# Patient Record
Sex: Female | Born: 1960
Health system: Southern US, Community
[De-identification: ages and names within clinical notes are randomized; demographics above are authoritative.]

## PROBLEM LIST (undated history)

## (undated) DIAGNOSIS — Z862 Personal history of diseases of the blood and blood-forming organs and certain disorders involving the immune mechanism: Secondary | ICD-10-CM

## (undated) DIAGNOSIS — D126 Benign neoplasm of colon, unspecified: Secondary | ICD-10-CM

## (undated) DIAGNOSIS — D649 Anemia, unspecified: Secondary | ICD-10-CM

## (undated) DIAGNOSIS — D689 Coagulation defect, unspecified: Secondary | ICD-10-CM

## (undated) DIAGNOSIS — I498 Other specified cardiac arrhythmias: Secondary | ICD-10-CM

## (undated) DIAGNOSIS — I493 Ventricular premature depolarization: Secondary | ICD-10-CM

## (undated) DIAGNOSIS — M1990 Unspecified osteoarthritis, unspecified site: Secondary | ICD-10-CM

## (undated) DIAGNOSIS — Z993 Dependence on wheelchair: Secondary | ICD-10-CM

## (undated) DIAGNOSIS — R609 Edema, unspecified: Secondary | ICD-10-CM

## (undated) DIAGNOSIS — T8859XA Other complications of anesthesia, initial encounter: Secondary | ICD-10-CM

## (undated) DIAGNOSIS — Z9989 Dependence on other enabling machines and devices: Secondary | ICD-10-CM

## (undated) DIAGNOSIS — Z975 Presence of (intrauterine) contraceptive device: Secondary | ICD-10-CM

## (undated) DIAGNOSIS — I499 Cardiac arrhythmia, unspecified: Secondary | ICD-10-CM

## (undated) DIAGNOSIS — J45991 Cough variant asthma: Principal | ICD-10-CM

## (undated) DIAGNOSIS — M51369 Other intervertebral disc degeneration, lumbar region without mention of lumbar back pain or lower extremity pain: Secondary | ICD-10-CM

## (undated) DIAGNOSIS — I491 Atrial premature depolarization: Secondary | ICD-10-CM

## (undated) DIAGNOSIS — E785 Hyperlipidemia, unspecified: Secondary | ICD-10-CM

## (undated) DIAGNOSIS — F419 Anxiety disorder, unspecified: Secondary | ICD-10-CM

## (undated) DIAGNOSIS — K5190 Ulcerative colitis, unspecified, without complications: Secondary | ICD-10-CM

## (undated) DIAGNOSIS — I4729 Other ventricular tachycardia: Secondary | ICD-10-CM

## (undated) DIAGNOSIS — G4730 Sleep apnea, unspecified: Secondary | ICD-10-CM

## (undated) DIAGNOSIS — T7840XA Allergy, unspecified, initial encounter: Secondary | ICD-10-CM

## (undated) DIAGNOSIS — I471 Supraventricular tachycardia, unspecified: Secondary | ICD-10-CM

## (undated) DIAGNOSIS — G4733 Obstructive sleep apnea (adult) (pediatric): Secondary | ICD-10-CM

## (undated) DIAGNOSIS — H269 Unspecified cataract: Secondary | ICD-10-CM

## (undated) DIAGNOSIS — Z9109 Other allergy status, other than to drugs and biological substances: Secondary | ICD-10-CM

## (undated) DIAGNOSIS — R0600 Dyspnea, unspecified: Secondary | ICD-10-CM

## (undated) DIAGNOSIS — N281 Cyst of kidney, acquired: Secondary | ICD-10-CM

## (undated) DIAGNOSIS — E278 Other specified disorders of adrenal gland: Secondary | ICD-10-CM

## (undated) DIAGNOSIS — Z8719 Personal history of other diseases of the digestive system: Secondary | ICD-10-CM

## (undated) DIAGNOSIS — F410 Panic disorder [episodic paroxysmal anxiety] without agoraphobia: Secondary | ICD-10-CM

## (undated) DIAGNOSIS — I2510 Atherosclerotic heart disease of native coronary artery without angina pectoris: Secondary | ICD-10-CM

## (undated) DIAGNOSIS — I2699 Other pulmonary embolism without acute cor pulmonale: Secondary | ICD-10-CM

## (undated) DIAGNOSIS — K8020 Calculus of gallbladder without cholecystitis without obstruction: Secondary | ICD-10-CM

## (undated) DIAGNOSIS — E538 Deficiency of other specified B group vitamins: Secondary | ICD-10-CM

## (undated) DIAGNOSIS — K635 Polyp of colon: Secondary | ICD-10-CM

## (undated) DIAGNOSIS — M810 Age-related osteoporosis without current pathological fracture: Secondary | ICD-10-CM

## (undated) DIAGNOSIS — I442 Atrioventricular block, complete: Secondary | ICD-10-CM

## (undated) DIAGNOSIS — E279 Disorder of adrenal gland, unspecified: Secondary | ICD-10-CM

## (undated) DIAGNOSIS — M5136 Other intervertebral disc degeneration, lumbar region: Secondary | ICD-10-CM

## (undated) DIAGNOSIS — Q245 Malformation of coronary vessels: Secondary | ICD-10-CM

## (undated) HISTORY — PX: CATARACT EXTRACTION: SUR2

## (undated) HISTORY — DX: Atherosclerotic heart disease of native coronary artery without angina pectoris: I25.10

## (undated) HISTORY — DX: Supraventricular tachycardia, unspecified: I47.10

## (undated) HISTORY — DX: Other pulmonary embolism without acute cor pulmonale: I26.99

## (undated) HISTORY — DX: Edema, unspecified: R60.9

## (undated) HISTORY — DX: Personal history of diseases of the blood and blood-forming organs and certain disorders involving the immune mechanism: Z86.2

## (undated) HISTORY — DX: Obstructive sleep apnea (adult) (pediatric): G47.33

## (undated) HISTORY — DX: Atrial premature depolarization: I49.1

## (undated) HISTORY — PX: TOOTH EXTRACTION: SUR596

## (undated) HISTORY — PX: RETINAL LASER PROCEDURE: SHX2339

## (undated) HISTORY — DX: Cyst of kidney, acquired: N28.1

## (undated) HISTORY — DX: Cough variant asthma: J45.991

## (undated) HISTORY — DX: Panic disorder (episodic paroxysmal anxiety): F41.0

## (undated) HISTORY — DX: Allergy, unspecified, initial encounter: T78.40XA

## (undated) HISTORY — PX: EYE SURGERY: SHX253

## (undated) HISTORY — PX: SIGMOIDOSCOPY: SUR1295

## (undated) HISTORY — DX: Benign neoplasm of colon, unspecified: D12.6

## (undated) HISTORY — DX: Age-related osteoporosis without current pathological fracture: M81.0

## (undated) HISTORY — DX: Unspecified osteoarthritis, unspecified site: M19.90

## (undated) HISTORY — DX: Ulcerative colitis, unspecified, without complications: K51.90

## (undated) HISTORY — DX: Other intervertebral disc degeneration, lumbar region: M51.36

## (undated) HISTORY — DX: Ventricular premature depolarization: I49.3

## (undated) HISTORY — DX: Calculus of gallbladder without cholecystitis without obstruction: K80.20

## (undated) HISTORY — DX: Deficiency of other specified B group vitamins: E53.8

## (undated) HISTORY — DX: Coagulation defect, unspecified: D68.9

## (undated) HISTORY — DX: Hyperlipidemia, unspecified: E78.5

## (undated) HISTORY — DX: Dependence on other enabling machines and devices: Z99.89

## (undated) HISTORY — DX: Other specified cardiac arrhythmias: I49.8

## (undated) HISTORY — DX: Other intervertebral disc degeneration, lumbar region without mention of lumbar back pain or lower extremity pain: M51.369

## (undated) HISTORY — DX: Malformation of coronary vessels: Q24.5

## (undated) HISTORY — DX: Morbid (severe) obesity due to excess calories: E66.01

## (undated) HISTORY — DX: Other allergy status, other than to drugs and biological substances: Z91.09

## (undated) HISTORY — DX: Atrioventricular block, complete: I44.2

## (undated) HISTORY — DX: Other ventricular tachycardia: I47.29

## (undated) HISTORY — DX: Unspecified cataract: H26.9

## (undated) HISTORY — DX: Cardiac arrhythmia, unspecified: I49.9

## (undated) HISTORY — PX: COLONOSCOPY: SHX174

## (undated) HISTORY — DX: Anxiety disorder, unspecified: F41.9

## (undated) HISTORY — DX: Polyp of colon: K63.5

## (undated) SURGERY — Surgical Case
Anesthesia: *Unknown

---

## 1999-08-23 ENCOUNTER — Encounter: Payer: Self-pay | Admitting: Internal Medicine

## 2003-09-01 ENCOUNTER — Other Ambulatory Visit: Admission: RE | Admit: 2003-09-01 | Discharge: 2003-09-01 | Payer: Self-pay | Admitting: Obstetrics & Gynecology

## 2003-12-10 ENCOUNTER — Encounter: Payer: Self-pay | Admitting: Cardiovascular Disease

## 2004-04-23 ENCOUNTER — Ambulatory Visit: Payer: Self-pay | Admitting: Internal Medicine

## 2005-03-07 ENCOUNTER — Ambulatory Visit: Payer: Self-pay | Admitting: Cardiovascular Disease

## 2005-03-25 ENCOUNTER — Ambulatory Visit: Payer: Self-pay | Admitting: Cardiovascular Disease

## 2005-08-04 ENCOUNTER — Ambulatory Visit: Payer: Self-pay | Admitting: Internal Medicine

## 2006-02-25 ENCOUNTER — Ambulatory Visit: Payer: Self-pay | Admitting: Family Medicine

## 2006-03-16 ENCOUNTER — Ambulatory Visit: Payer: Self-pay | Admitting: Internal Medicine

## 2006-09-14 ENCOUNTER — Ambulatory Visit: Payer: Self-pay | Admitting: Internal Medicine

## 2006-09-14 LAB — CONVERTED CEMR LAB
AST: 12 units/L (ref 0–37)
Albumin: 3 g/dL — ABNORMAL LOW (ref 3.5–5.2)
Alkaline Phosphatase: 56 units/L (ref 39–117)
Basophils Relative: 0.4 % (ref 0.0–1.0)
Bilirubin Urine: NEGATIVE
Chloride: 107 meq/L (ref 96–112)
Creatinine, Ser: 0.8 mg/dL (ref 0.4–1.2)
Crystals: NEGATIVE
Eosinophils Relative: 2.2 % (ref 0.0–5.0)
HCT: 38.3 % (ref 36.0–46.0)
Hemoglobin: 13 g/dL (ref 12.0–15.0)
Hgb A1c MFr Bld: 6 % (ref 4.6–6.0)
Lymphocytes Relative: 23 % (ref 12.0–46.0)
Monocytes Absolute: 0.7 10*3/uL (ref 0.2–0.7)
Mucus, UA: NEGATIVE
Neutrophils Relative %: 67.1 % (ref 43.0–77.0)
Nitrite: NEGATIVE
RDW: 12.8 % (ref 11.5–14.6)
Sodium: 141 meq/L (ref 135–145)
Specific Gravity, Urine: >=1.03 (ref 1.000–1.03)
Total Bilirubin: 0.5 mg/dL (ref 0.3–1.2)
Total Protein, Urine: NEGATIVE mg/dL

## 2006-12-01 ENCOUNTER — Ambulatory Visit: Payer: Self-pay | Admitting: Internal Medicine

## 2007-03-29 ENCOUNTER — Encounter: Payer: Self-pay | Admitting: Internal Medicine

## 2007-07-10 ENCOUNTER — Telehealth: Payer: Self-pay | Admitting: Internal Medicine

## 2007-07-25 ENCOUNTER — Telehealth: Payer: Self-pay | Admitting: Internal Medicine

## 2007-08-23 DIAGNOSIS — R4589 Other symptoms and signs involving emotional state: Secondary | ICD-10-CM | POA: Insufficient documentation

## 2007-08-23 DIAGNOSIS — F418 Other specified anxiety disorders: Secondary | ICD-10-CM

## 2007-08-23 DIAGNOSIS — K5190 Ulcerative colitis, unspecified, without complications: Secondary | ICD-10-CM

## 2007-08-23 DIAGNOSIS — I499 Cardiac arrhythmia, unspecified: Secondary | ICD-10-CM | POA: Insufficient documentation

## 2007-08-23 DIAGNOSIS — F410 Panic disorder [episodic paroxysmal anxiety] without agoraphobia: Secondary | ICD-10-CM | POA: Insufficient documentation

## 2007-10-15 ENCOUNTER — Ambulatory Visit: Payer: Self-pay | Admitting: Internal Medicine

## 2007-10-18 ENCOUNTER — Ambulatory Visit: Payer: Self-pay | Admitting: Internal Medicine

## 2007-10-23 ENCOUNTER — Encounter: Payer: Self-pay | Admitting: Internal Medicine

## 2007-10-23 LAB — CONVERTED CEMR LAB
Alkaline Phosphatase: 56 units/L (ref 39–117)
Bilirubin Urine: NEGATIVE
Bilirubin, Direct: 0.1 mg/dL (ref 0.0–0.3)
CO2: 29 meq/L (ref 19–32)
Crystals: NEGATIVE
GFR calc Af Amer: 86 mL/min
Glucose, Bld: 103 mg/dL — ABNORMAL HIGH (ref 70–99)
HDL: 28.9 mg/dL — ABNORMAL LOW (ref >39.0)
Leukocytes, UA: NEGATIVE
Lymphocytes Relative: 17.6 % (ref 12.0–46.0)
Monocytes Relative: 5.3 % (ref 3.0–12.0)
Nitrite: NEGATIVE
Platelets: 342 10*3/uL (ref 150–400)
Potassium: 4.7 meq/L (ref 3.5–5.1)
RDW: 12.2 % (ref 11.5–14.6)
Sodium: 140 meq/L (ref 135–145)
Total Bilirubin: 0.4 mg/dL (ref 0.3–1.2)
Total CHOL/HDL Ratio: 5.8
Total Protein: 7.2 g/dL (ref 6.0–8.3)
Urobilinogen, UA: 0.2 (ref 0.0–1.0)
VLDL: 22 mg/dL (ref 0–40)

## 2007-10-25 ENCOUNTER — Telehealth: Payer: Self-pay | Admitting: Internal Medicine

## 2007-10-25 ENCOUNTER — Encounter: Payer: Self-pay | Admitting: Internal Medicine

## 2008-04-07 ENCOUNTER — Telehealth: Payer: Self-pay | Admitting: Internal Medicine

## 2008-06-30 ENCOUNTER — Ambulatory Visit: Payer: Self-pay | Admitting: Internal Medicine

## 2008-06-30 DIAGNOSIS — R002 Palpitations: Secondary | ICD-10-CM | POA: Insufficient documentation

## 2008-06-30 DIAGNOSIS — M722 Plantar fascial fibromatosis: Secondary | ICD-10-CM

## 2008-06-30 DIAGNOSIS — D1779 Benign lipomatous neoplasm of other sites: Secondary | ICD-10-CM

## 2008-06-30 DIAGNOSIS — E559 Vitamin D deficiency, unspecified: Secondary | ICD-10-CM | POA: Insufficient documentation

## 2008-06-30 LAB — CONVERTED CEMR LAB: Vit D, 25-Hydroxy: 10 ng/mL — ABNORMAL LOW (ref 30–89)

## 2008-07-02 LAB — CONVERTED CEMR LAB
AST: 20 units/L (ref 0–37)
Albumin: 3.3 g/dL — ABNORMAL LOW (ref 3.5–5.2)
Alkaline Phosphatase: 62 units/L (ref 39–117)
BUN: 8 mg/dL (ref 6–23)
Bacteria, UA: NEGATIVE
Basophils Relative: 1.2 % (ref 0.0–3.0)
Creatinine, Ser: 0.7 mg/dL (ref 0.4–1.2)
Crystals: NEGATIVE
Eosinophils Absolute: 0.3 10*3/uL (ref 0.0–0.7)
Eosinophils Relative: 4.3 % (ref 0.0–5.0)
GFR calc Af Amer: 114 mL/min
GFR calc non Af Amer: 95 mL/min
Glucose, Bld: 96 mg/dL (ref 70–99)
HCT: 39.7 % (ref 36.0–46.0)
Hemoglobin: 13.6 g/dL (ref 12.0–15.0)
MCV: 92.4 fL (ref 78.0–100.0)
Monocytes Absolute: 0.2 10*3/uL (ref 0.1–1.0)
Monocytes Relative: 2.9 % — ABNORMAL LOW (ref 3.0–12.0)
Neutro Abs: 6 10*3/uL (ref 1.4–7.7)
RBC: 4.3 M/uL (ref 3.87–5.11)
Sed Rate: 55 mm/hr — ABNORMAL HIGH (ref 0–22)
TSH: 1.57 microintl units/mL (ref 0.35–5.50)
Total Protein, Urine: NEGATIVE mg/dL
Total Protein: 7.2 g/dL (ref 6.0–8.3)
Urine Glucose: NEGATIVE mg/dL
WBC: 7.9 10*3/uL (ref 4.5–10.5)
pH: 5.5 (ref 5.0–8.0)

## 2008-08-12 ENCOUNTER — Ambulatory Visit: Payer: Self-pay | Admitting: Internal Medicine

## 2008-08-12 DIAGNOSIS — R1012 Left upper quadrant pain: Secondary | ICD-10-CM | POA: Insufficient documentation

## 2008-08-12 DIAGNOSIS — L049 Acute lymphadenitis, unspecified: Secondary | ICD-10-CM | POA: Insufficient documentation

## 2008-08-15 ENCOUNTER — Encounter: Admission: RE | Admit: 2008-08-15 | Discharge: 2008-08-15 | Payer: Self-pay | Admitting: Internal Medicine

## 2008-08-22 ENCOUNTER — Telehealth (INDEPENDENT_AMBULATORY_CARE_PROVIDER_SITE_OTHER): Payer: Self-pay | Admitting: *Deleted

## 2008-08-26 ENCOUNTER — Ambulatory Visit: Payer: Self-pay | Admitting: Internal Medicine

## 2008-08-26 LAB — CONVERTED CEMR LAB
ALT: 22 units/L (ref 0–35)
BUN: 11 mg/dL (ref 6–23)
Basophils Relative: 0.9 % (ref 0.0–3.0)
CO2: 31 meq/L (ref 19–32)
Calcium: 9.3 mg/dL (ref 8.4–10.5)
Chloride: 107 meq/L (ref 96–112)
Creatinine, Ser: 0.7 mg/dL (ref 0.4–1.2)
Eosinophils Relative: 5.1 % — ABNORMAL HIGH (ref 0.0–5.0)
GFR calc non Af Amer: 94.36 mL/min (ref >60)
HCT: 40.1 % (ref 36.0–46.0)
Hemoglobin: 13.5 g/dL (ref 12.0–15.0)
Lymphocytes Relative: 18.9 % (ref 12.0–46.0)
Monocytes Relative: 5.8 % (ref 3.0–12.0)
RBC: 4.28 M/uL (ref 3.87–5.11)
Total Bilirubin: 0.6 mg/dL (ref 0.3–1.2)

## 2008-08-28 ENCOUNTER — Ambulatory Visit: Payer: Self-pay | Admitting: Internal Medicine

## 2008-08-28 DIAGNOSIS — R5383 Other fatigue: Secondary | ICD-10-CM

## 2008-08-28 DIAGNOSIS — R5381 Other malaise: Secondary | ICD-10-CM | POA: Insufficient documentation

## 2008-09-03 ENCOUNTER — Telehealth: Payer: Self-pay | Admitting: Internal Medicine

## 2008-11-28 ENCOUNTER — Ambulatory Visit: Payer: Self-pay | Admitting: Internal Medicine

## 2008-11-28 DIAGNOSIS — E538 Deficiency of other specified B group vitamins: Secondary | ICD-10-CM

## 2008-11-28 DIAGNOSIS — N3090 Cystitis, unspecified without hematuria: Secondary | ICD-10-CM | POA: Insufficient documentation

## 2008-12-01 ENCOUNTER — Ambulatory Visit: Payer: Self-pay | Admitting: Cardiovascular Disease

## 2008-12-01 DIAGNOSIS — R609 Edema, unspecified: Secondary | ICD-10-CM | POA: Insufficient documentation

## 2008-12-01 LAB — CONVERTED CEMR LAB
AST: 19 units/L (ref 0–37)
Alkaline Phosphatase: 53 units/L (ref 39–117)
Basophils Relative: 0 % (ref 0.0–3.0)
Bilirubin Urine: NEGATIVE
Bilirubin, Direct: 0.1 mg/dL (ref 0.0–0.3)
CO2: 29 meq/L (ref 19–32)
Calcium: 8.7 mg/dL (ref 8.4–10.5)
Creatinine, Ser: 0.8 mg/dL (ref 0.4–1.2)
Eosinophils Absolute: 0.1 10*3/uL (ref 0.0–0.7)
GFR calc non Af Amer: 80.8 mL/min (ref >60)
Hemoglobin: 12.9 g/dL (ref 12.0–15.0)
Lymphocytes Relative: 19.5 % (ref 12.0–46.0)
MCHC: 33.7 g/dL (ref 30.0–36.0)
Monocytes Relative: 4.7 % (ref 3.0–12.0)
Neutrophils Relative %: 74.4 % (ref 43.0–77.0)
Nitrite: NEGATIVE
RBC: 4.15 M/uL (ref 3.87–5.11)
Sodium: 141 meq/L (ref 135–145)
Total Protein, Urine: NEGATIVE mg/dL
Total Protein: 6.9 g/dL (ref 6.0–8.3)
Urine Glucose: NEGATIVE mg/dL
Vitamin B-12: 208 pg/mL — ABNORMAL LOW (ref 211–911)
WBC: 8 10*3/uL (ref 4.5–10.5)
pH: 6 (ref 5.0–8.0)

## 2008-12-02 ENCOUNTER — Encounter (INDEPENDENT_AMBULATORY_CARE_PROVIDER_SITE_OTHER): Payer: Self-pay | Admitting: *Deleted

## 2008-12-08 LAB — CONVERTED CEMR LAB: Vit D, 25-Hydroxy: 21 ng/mL — ABNORMAL LOW (ref 30–89)

## 2008-12-11 ENCOUNTER — Ambulatory Visit: Payer: Self-pay

## 2008-12-11 ENCOUNTER — Encounter: Payer: Self-pay | Admitting: Cardiology

## 2009-01-02 ENCOUNTER — Encounter: Payer: Self-pay | Admitting: Internal Medicine

## 2009-01-16 ENCOUNTER — Ambulatory Visit: Payer: Self-pay | Admitting: Cardiovascular Disease

## 2009-01-27 ENCOUNTER — Telehealth: Payer: Self-pay | Admitting: Cardiovascular Disease

## 2009-02-02 ENCOUNTER — Telehealth (INDEPENDENT_AMBULATORY_CARE_PROVIDER_SITE_OTHER): Payer: Self-pay | Admitting: *Deleted

## 2009-02-15 ENCOUNTER — Telehealth: Payer: Self-pay | Admitting: Family Medicine

## 2009-02-20 ENCOUNTER — Telehealth: Payer: Self-pay | Admitting: Cardiovascular Disease

## 2009-03-02 ENCOUNTER — Telehealth (INDEPENDENT_AMBULATORY_CARE_PROVIDER_SITE_OTHER): Payer: Self-pay | Admitting: *Deleted

## 2009-03-03 ENCOUNTER — Ambulatory Visit: Payer: Self-pay | Admitting: Internal Medicine

## 2009-03-13 ENCOUNTER — Encounter: Payer: Self-pay | Admitting: Internal Medicine

## 2009-04-13 ENCOUNTER — Telehealth: Payer: Self-pay | Admitting: Cardiovascular Disease

## 2009-05-15 ENCOUNTER — Ambulatory Visit: Payer: Self-pay | Admitting: Internal Medicine

## 2009-05-15 DIAGNOSIS — L0211 Cutaneous abscess of neck: Secondary | ICD-10-CM | POA: Insufficient documentation

## 2009-05-15 DIAGNOSIS — L03221 Cellulitis of neck: Secondary | ICD-10-CM

## 2009-05-25 ENCOUNTER — Telehealth (INDEPENDENT_AMBULATORY_CARE_PROVIDER_SITE_OTHER): Payer: Self-pay | Admitting: *Deleted

## 2009-05-27 ENCOUNTER — Ambulatory Visit: Payer: Self-pay | Admitting: Internal Medicine

## 2009-05-28 LAB — CONVERTED CEMR LAB
AST: 18 units/L (ref 0–37)
Alkaline Phosphatase: 46 units/L (ref 39–117)
Basophils Absolute: 0 10*3/uL (ref 0.0–0.1)
Bilirubin, Direct: 0.1 mg/dL (ref 0.0–0.3)
CRP, High Sensitivity: 11.5 — ABNORMAL HIGH (ref 0.00–5.00)
GFR calc non Af Amer: 80.64 mL/min (ref >60)
Lymphocytes Relative: 24.2 % (ref 12.0–46.0)
Monocytes Relative: 6.3 % (ref 3.0–12.0)
Platelets: 287 10*3/uL (ref 150.0–400.0)
Potassium: 4.5 meq/L (ref 3.5–5.1)
RDW: 13.1 % (ref 11.5–14.6)
Sed Rate: 43 mm/hr — ABNORMAL HIGH (ref 0–22)
Sodium: 143 meq/L (ref 135–145)
TSH: 2.68 microintl units/mL (ref 0.35–5.50)
Total Bilirubin: 0.5 mg/dL (ref 0.3–1.2)

## 2009-05-29 ENCOUNTER — Ambulatory Visit: Payer: Self-pay | Admitting: Internal Medicine

## 2009-05-29 DIAGNOSIS — Z87891 Personal history of nicotine dependence: Secondary | ICD-10-CM | POA: Insufficient documentation

## 2009-06-12 ENCOUNTER — Encounter: Payer: Self-pay | Admitting: Internal Medicine

## 2009-07-08 ENCOUNTER — Ambulatory Visit: Payer: Self-pay | Admitting: Internal Medicine

## 2009-07-08 LAB — CONVERTED CEMR LAB: Anti Nuclear Antibody(ANA): NEGATIVE

## 2009-07-09 LAB — CONVERTED CEMR LAB
BUN: 8 mg/dL (ref 6–23)
Chloride: 108 meq/L (ref 96–112)
Potassium: 4.1 meq/L (ref 3.5–5.1)
Vitamin B-12: 277 pg/mL (ref 211–911)

## 2009-07-13 ENCOUNTER — Ambulatory Visit: Payer: Self-pay | Admitting: Internal Medicine

## 2009-08-12 ENCOUNTER — Telehealth: Payer: Self-pay | Admitting: Internal Medicine

## 2009-09-10 ENCOUNTER — Telehealth: Payer: Self-pay | Admitting: Cardiovascular Disease

## 2009-09-11 ENCOUNTER — Encounter: Payer: Self-pay | Admitting: Cardiovascular Disease

## 2009-09-17 ENCOUNTER — Telehealth: Payer: Self-pay | Admitting: Internal Medicine

## 2009-09-17 ENCOUNTER — Ambulatory Visit: Payer: Self-pay | Admitting: Internal Medicine

## 2009-09-17 LAB — CONVERTED CEMR LAB
ALT: 18 U/L
AST: 19 U/L
Albumin: 3.3 g/dL — ABNORMAL LOW
Alkaline Phosphatase: 54 U/L
BUN: 9 mg/dL
Basophils Absolute: 0 K/uL
Basophils Relative: 0.3 %
Bilirubin Urine: NEGATIVE
Bilirubin, Direct: 0.2 mg/dL
CO2: 26 meq/L
CRP, High Sensitivity: 26.79 — ABNORMAL HIGH
Calcium: 9.1 mg/dL
Chloride: 106 meq/L
Cholesterol: 175 mg/dL
Creatinine, Ser: 0.7 mg/dL
Eosinophils Absolute: 0.2 K/uL
Eosinophils Relative: 3.1 %
GFR calc non Af Amer: 88.12 mL/min
Glucose, Bld: 82 mg/dL
HCT: 39.4 %
HDL: 34.9 mg/dL — ABNORMAL LOW
Hemoglobin: 13.1 g/dL
Ketones, ur: NEGATIVE mg/dL
LDL Cholesterol: 117 mg/dL — ABNORMAL HIGH
Leukocytes, UA: NEGATIVE
Lymphocytes Relative: 27 %
Lymphs Abs: 1.9 K/uL
MCHC: 33.3 g/dL
MCV: 94.6 fL
Monocytes Absolute: 0.5 K/uL
Monocytes Relative: 7.2 %
Neutro Abs: 4.3 K/uL
Neutrophils Relative %: 62.4 %
Nitrite: NEGATIVE
Platelets: 271 K/uL
Potassium: 5.3 meq/L — ABNORMAL HIGH
RBC: 4.16 M/uL
RDW: 14.2 %
Sodium: 146 meq/L — ABNORMAL HIGH
Specific Gravity, Urine: >=1.03
TSH: 2.6 u[IU]/mL
Total Bilirubin: 0.5 mg/dL
Total CHOL/HDL Ratio: 5
Total Protein, Urine: NEGATIVE mg/dL
Total Protein: 6.9 g/dL
Triglycerides: 116 mg/dL
Urine Glucose: NEGATIVE mg/dL
Urobilinogen, UA: 0.2
VLDL: 23.2 mg/dL
Vitamin B-12: 367 pg/mL
WBC: 6.9 10*3/microliter
pH: 5.5

## 2009-09-18 ENCOUNTER — Telehealth: Payer: Self-pay | Admitting: Internal Medicine

## 2010-01-12 ENCOUNTER — Telehealth (INDEPENDENT_AMBULATORY_CARE_PROVIDER_SITE_OTHER): Payer: Self-pay | Admitting: *Deleted

## 2010-01-19 ENCOUNTER — Ambulatory Visit: Payer: Self-pay | Admitting: Internal Medicine

## 2010-01-19 DIAGNOSIS — R61 Generalized hyperhidrosis: Secondary | ICD-10-CM

## 2010-01-20 ENCOUNTER — Telehealth: Payer: Self-pay | Admitting: Internal Medicine

## 2010-01-20 LAB — CONVERTED CEMR LAB
BUN: 11 mg/dL (ref 6–23)
Basophils Absolute: 0 10*3/uL (ref 0.0–0.1)
Basophils Relative: 0.5 % (ref 0.0–3.0)
Chloride: 107 meq/L (ref 96–112)
Eosinophils Relative: 1.6 % (ref 0.0–5.0)
Glucose, Bld: 93 mg/dL (ref 70–99)
HCT: 41 % (ref 36.0–46.0)
Hemoglobin: 13.8 g/dL (ref 12.0–15.0)
Lymphocytes Relative: 23.7 % (ref 12.0–46.0)
Lymphs Abs: 1.8 10*3/uL (ref 0.7–4.0)
Monocytes Relative: 6.4 % (ref 3.0–12.0)
Neutro Abs: 5.2 10*3/uL (ref 1.4–7.7)
Potassium: 5 meq/L (ref 3.5–5.1)
RBC: 4.38 M/uL (ref 3.87–5.11)
RDW: 13.4 % (ref 11.5–14.6)
Sodium: 145 meq/L (ref 135–145)
TSH: 1.87 microintl units/mL (ref 0.35–5.50)

## 2010-02-01 ENCOUNTER — Telehealth: Payer: Self-pay | Admitting: Internal Medicine

## 2010-02-19 ENCOUNTER — Ambulatory Visit: Payer: Self-pay | Admitting: Cardiovascular Disease

## 2010-03-25 ENCOUNTER — Telehealth: Payer: Self-pay | Admitting: Internal Medicine

## 2010-04-06 ENCOUNTER — Telehealth: Payer: Self-pay | Admitting: Internal Medicine

## 2010-04-14 ENCOUNTER — Ambulatory Visit: Payer: Self-pay | Admitting: Internal Medicine

## 2010-04-21 LAB — CONVERTED CEMR LAB
BUN: 13 mg/dL (ref 6–23)
CO2: 27 meq/L (ref 19–32)
Chloride: 107 meq/L (ref 96–112)
Creatinine, Ser: 0.7 mg/dL (ref 0.4–1.2)
Glucose, Bld: 91 mg/dL (ref 70–99)
Potassium: 4.4 meq/L (ref 3.5–5.1)

## 2010-04-26 ENCOUNTER — Ambulatory Visit: Payer: Self-pay | Admitting: Internal Medicine

## 2010-05-17 ENCOUNTER — Telehealth: Payer: Self-pay | Admitting: Internal Medicine

## 2010-05-17 ENCOUNTER — Telehealth (INDEPENDENT_AMBULATORY_CARE_PROVIDER_SITE_OTHER): Payer: Self-pay | Admitting: *Deleted

## 2010-05-17 DIAGNOSIS — R42 Dizziness and giddiness: Secondary | ICD-10-CM | POA: Insufficient documentation

## 2010-06-10 NOTE — Assessment & Plan Note (Signed)
Summary: SEE PHONE NOTE,LUMP BACK OF HEAD/DOING LABS 1/18 OR 1/19/CD   Vital Signs:  Patient profile:   50 year old female Weight:      303 pounds Temp:     98.5 degrees F oral Pulse rate:   59 / minute BP sitting:   116 / 90  (left arm)  Vitals Entered By: Doralee Albino (May 29, 2009 9:26 AM) CC: f/u Is Patient Diabetic? No   CC:  f/u.  History of Present Illness: F/u B12 def, scalp infiltrate, palpitations  Preventive Screening-Counseling & Management  Alcohol-Tobacco     Smoking Status: quit  Current Medications (verified): 1)  Xanax 1 Mg Tabs (Alprazolam) .... 1/2 in Am,   1/2 in Noon As Needed and 39m in Pm 2)  Vitamin D3 2000 Unit Caps (Cholecalciferol) ..Marland Kitchen. 1 Tab By Mouth Once Daily 3)  Loestrin 24 Fe 1-20 Mg-Mcg Tabs (Norethin Ace-Eth Estrad-Fe) .... Once Daily 4)  Vitamin B-12 Cr 1000 Mcg  Tbcr (Cyanocobalamin) .... Take One Tablet By Mouth Daily 5)  Probiotic Formula  Caps (Probiotic Product) ..Marland Kitchen. 1 Tab  Two Times A Day 6)  Atenolol 100 Mg Tabs (Atenolol) ..Marland Kitchen. 1 in Am and 50 in Pm 7)  Ibuprofen 600 Mg  Tabs (Ibuprofen) ..Marland Kitchen. 1 By Mouth Two Times A Day As Needed Pain Pc  Allergies: 1)  * Antihistamines 2)  Azulfidine (Sulfasalazine) 3)  Benadryl (Diphenhydramine Hcl) 4)  * Oral Steroids 5)  Penicillin V Potassium (Penicillin V Potassium) 6)  Prednisone (Prednisone) 7)  Rowasa 8)  Vitamin B-12 (Cyanocobalamin) 9)  Vitamin D  Past History:  Past Medical History: Last updated: 06/30/2008 Current Problems:  ANXIETY (ICD-300.00) PANIC DISORDER (ICD-300.01) CARDIAC ARRHYTHMIA (ICD-427.9) ALLERGY, HX OF (ICD-V15.09) HYPERTENSION (ICD-401.9) ULCERATIVE COLITIS (ICD-556.9) Personality disorder Morbid obesity Low Vit B12 Vit D def  Social History: Last updated: 12/01/2008 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  The patient denies anorexia,  fever, and abdominal pain.         tired  Physical Exam  General:  alert and overweight-appearing Nose:  no external erythema and no nasal discharge.   Mouth:  no gingival abnormalities and pharynx pink and moist.   Neck:  supple., several tender LN like < 1 cm subq knots to right post neck, also one node to post right angle of the jaw, as well as another subq to mid left jawline; no palpable nodes to the pre and post SCM's bilat; no signific axillary LA as well Lungs:  Normal respiratory effort, chest expands symmetrically. Lungs are clear to auscultation, no crackles or wheezes. Heart:  Normal rate and regular rhythm. S1 and S2 normal without gallop, murmur, click, rub or other extra sounds. Abdomen:  Bowel sounds positive,abdomen soft and non-tender without masses, organomegaly or hernias noted. Msk:  No deformity or scoliosis noted of thoracic or lumbar spine. Extremities:  no edema, no ulcers  Neurologic:  No cranial nerve deficits noted. Station and gait are normal. Plantar reflexes are down-going bilaterally. DTRs are symmetrical throughout. Sensory, motor and coordinative functions appear intact. Skin:  swollen erythematous warm tender skin fold prox poster neck Psych:  Cognition and judgment appear intact. Alert and cooperative with normal attention span and concentration. No apparent delusions, illusions, hallucinations   Impression & Recommendations:  Problem # 1:  VITAMIN B12 DEFICIENCY (ICD-266.2) Assessment Deteriorated Risks of noncompliance with treatment discussed. Compliance encouraged.  See Rx  Problem # 2:  CELLULITIS AND ABSCESS OF NECK (ICD-682.1) ? etiol - poss. E nodosa Assessment: Unchanged  Orders: Dermatology Referral (Derma)  Problem # 3:  FATIGUE (ICD-780.79) Assessment: Unchanged  Problem # 4:  PALPITATIONS (IXB-847.1) Assessment: Unchanged  Her updated medication list for this problem includes:    Atenolol 100 Mg Tabs (Atenolol) .Marland Kitchen... 1 in am and  50 in pm  Complete Medication List: 1)  Xanax 1 Mg Tabs (Alprazolam) .... 1/2 in am,   1/2 in noon as needed and 62m in pm 2)  Vitamin D3 2000 Unit Caps (Cholecalciferol) ..Marland Kitchen. 1 tab by mouth once daily 3)  Loestrin 24 Fe 1-20 Mg-mcg Tabs (Norethin ace-eth estrad-fe) .... Once daily 4)  Vitamin B-12 Cr 1000 Mcg Tbcr (Cyanocobalamin) .... Take one tablet by mouth daily 5)  Probiotic Formula Caps (Probiotic product) ..Marland Kitchen. 1 tab  two times a day 6)  Atenolol 100 Mg Tabs (Atenolol) ..Marland Kitchen. 1 in am and 50 in pm 7)  Ibuprofen 600 Mg Tabs (Ibuprofen) ..Marland Kitchen. 1 by mouth two times a day as needed pain pc 8)  Nascobal 500 Mcg/0.131mSoln (Cyanocobalamin) ...Marland Kitchen 1 spr each nostr q1wk alternate nostrils  Patient Instructions: 1)  Please schedule a follow-up appointment in 6 wks. 2)  BMP prior to visit, ICD-9: 3)  B12 4)  ESR 5)  CRP 6)  ASO  995.20  266.20 7)  ANA Prescriptions: NASCOBAL 500 MCG/0.1ML SOLN (CYANOCOBALAMIN) 1 spr each nostr q1wk alternate nostrils  #1 x 12   Entered and Authorized by:   AlCassandria AngerD   Signed by:   AlCassandria AngerD on 05/29/2009   Method used:   Print then Give to Patient   RxID:   [id number]

## 2010-06-10 NOTE — Progress Notes (Signed)
Summary: referral  Phone Note Call from Patient Call back at Home Phone [phone]   Summary of Call: wants a referral to dr Merry Proud [facility] for lightheadness.  Thinks it's from inner ear problem.  Initial call taken by: Glena Norfolk,  May 17, 2010 10:50 AM  Follow-up for Phone Call        ok Follow-up by: Cassandria Anger MD,  May 17, 2010 1:16 PM  Additional Follow-up for Phone Call Additional follow up Details #1::        Pt informed  Additional Follow-up by: Charlsie Quest, CMA,  May 17, 2010 1:33 PM  New Problems: DIZZINESS (ICD-780.4)   New Problems: DIZZINESS (ICD-780.4)

## 2010-06-10 NOTE — Progress Notes (Signed)
Summary: med request  Phone Note Call from Patient   Caller: Patient Summary of Call: Patient lmovm c/o cough , congestion x 1 wk w/ green,yellow phlem. She would like to know if MD will call in a zpak and not require an office visit. She is afraid of " catching something on top of that". Please advise Thanks Initial call taken by: Estell Harpin CMA,  February 01, 2010 11:34 AM  Follow-up for Phone Call        ok Follow-up by: Cassandria Anger MD,  February 01, 2010 4:20 PM  Additional Follow-up for Phone Call Additional follow up Details #1::        Pt informed  Additional Follow-up by: Charlsie Quest, CMA,  February 01, 2010 6:20 PM    New/Updated Medications: ZITHROMAX Z-PAK 250 MG TABS (AZITHROMYCIN) as dirrected Prescriptions: ZITHROMAX Z-PAK 250 MG TABS (AZITHROMYCIN) as dirrected  #1 x 0   Entered and Authorized by:   Cassandria Anger MD   Signed by:   Charlsie Quest, CMA on 02/01/2010   Method used:   Electronically to        [facility] (retail)       [address]       [place], [place]  [address]       Ph: [phone]       Fax: [phone]   RxID:   [id number]

## 2010-06-10 NOTE — Assessment & Plan Note (Signed)
Summary: f1y/ gd  Medications Added XANAX 1 MG TABS (ALPRAZOLAM) 1/2 in am,   1 at bedtime and as needed      Allergies Added:   History of Present Illness: Cassie [name] continue to be anxious and complain of dizzyness and palpitations.  She had an event monitor earlier this year with only one PVC and no arrythmia.  She seems to think she is running a lower heart rate and I told her not to take her atenolol in the afternoon if her pulse is in the 60s.  She continues to be overweight and go through menapause.  She thinks changing her BCP to generic may have worsened her palpitations.  Her dizzyness is vague.  She is nonpostural.  She has been seen by ENT and has no vertigo.  She has had an ill defined elevated sed rate in the 40's and this probably needs further w.u by primary  Current Problems (verified): 1)  Hyperhidrosis  (ICD-780.8) 2)  Tobacco Use, Quit  (ICD-V15.82) 3)  Cellulitis and Abscess of Neck  (ICD-682.1) 4)  Edema  (ICD-782.3) 5)  Cystitis  (ICD-595.9) 6)  Vitamin B12 Deficiency  (ICD-266.2) 7)  Fatigue  (ICD-780.79) 8)  Luq Pain  (ICD-789.02) 9)  Cervical Lymphadenitis  (ICD-683) 10)  Uri  (ICD-465.9) 11)  Vitamin D Deficiency  (ICD-268.9) 12)  Plantar Fascitis  (ICD-728.71) 13)  Lipoma Nec  (ICD-214.8) 14)  Palpitations  (ICD-785.1) 15)  Obesity, Morbid  (ICD-278.01) 16)  Anxiety  (ICD-300.00) 17)  Panic Disorder  (ICD-300.01) 18)  Cardiac Arrhythmia  (ICD-427.9) 19)  Allergy, Hx of  (ICD-V15.09) 20)  Ulcerative Colitis  (ICD-556.9)  Current Medications (verified): 1)  Xanax 1 Mg Tabs (Alprazolam) .... 1/2 in Am,   1 At Bedtime and As Needed 2)  Vitamin D3 2000 Unit Caps (Cholecalciferol) .Marland Kitchen.. 1 Tab By Mouth Once Daily 3)  Loestrin Fe 1/20 1-20 Mg-Mcg Tabs (Norethin Ace-Eth Estrad-Fe) .... Once Daily 4)  Atenolol 100 Mg Tabs (Atenolol) .Marland Kitchen.. 1 in Am and 50 in Pm 5)  Ibuprofen 600 Mg  Tabs (Ibuprofen) .Marland Kitchen.. 1 By Mouth Two Times A Day As Needed Pain Pc 6)  Nascobal 500  Mcg/0.28m Soln (Cyanocobalamin) ..Marland Kitchen. 1 Spr One Nostr Q1wk Alternate Nostrils  Allergies (verified): 1)  * Antihistamines 2)  Azulfidine (Sulfasalazine) 3)  Benadryl (Diphenhydramine Hcl) 4)  * Oral Steroids 5)  Penicillin V Potassium (Penicillin V Potassium) 6)  Prednisone (Prednisone) 7)  Rowasa 8)  Vitamin B-12 (Cyanocobalamin) 9)  Vitamin D  Past History:  Past Medical History: Last updated: 01/19/2010 Current Problems:  ANXIETY (ICD-300.00) PANIC DISORDER (ICD-300.01) CARDIAC ARRHYTHMIA (ICD-427.9) ALLERGY, HX OF (ICD-V15.09) ULCERATIVE COLITIS (ICD-556.9) Personality disorder Morbid obesity Low Vit B12 Vit D def  Past Surgical History: Last updated: 11/28/2008 Denies surgical history  Family History: Last updated: 10/15/2007 M had a neurologic disease ?ALS  Social History: Last updated: 12/01/2008 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   Vital Signs:  Patient profile:   50year old female Height:      65 inches Weight:      307 pounds BMI:     51.27 Pulse rate:   60 / minute Pulse (ortho):   65 / minute Resp:     18 per minute BP sitting:   132 /  78  (left arm) BP standing:   135 / 75  Vitals Entered By: Levora Angel, CNA (February 19, 2010 3:54 PM)  Physical Exam  General:  Affect appropriate Healthy:  appears stated age 50: normal Neck supple with no adenopathy JVP normal no bruits no thyromegaly Lungs clear with no wheezing and good diaphragmatic motion Heart:  S1/S2 no murmur,rub, gallop or click PMI normal Abdomen: benighn, BS positve, no tenderness, no AAA no bruit.  No HSM or HJR Distal pulses intact with no bruits No edema Neuro non-focal Skin warm and dry    Impression &  Recommendations:  Problem # 1:  PALPITATIONS (ICD-785.1) Continue beta blocker with no evidence of signficant structural heart problem Her updated medication list for this problem includes:    Atenolol 100 Mg Tabs (Atenolol) .Marland Kitchen... 1 in am and 50 in pm  Problem # 2:  ANXIETY (ICD-300.00) I think this continues to be a major issue for her Palpitatons are worse at night in quite room which usually indicates supratentorial etiology to symptoms.  ? Does she have a psychologist  Patient Instructions: 1)  Your physician recommends that you schedule a follow-up appointment in: [place] 2)  Your physician recommends that you continue on your current medications as directed. Please refer to the Current Medication list given to you today.

## 2010-06-10 NOTE — Progress Notes (Signed)
Summary: rx clarification  Phone Note From Pharmacy   Summary of Call: Patient pharmacy called stating that patient was given Nascobal with instructions to use in each nostril. It is usually given 1 nostril weekly (alternating). Please advise. Initial call taken by: Ernestene Mention,  August 12, 2009 11:54 AM  Follow-up for Phone Call        1 nostr weekly is correct. Sorry - my mistake... Follow-up by: Cassandria Anger MD,  August 12, 2009 1:16 PM  Additional Follow-up for Phone Call Additional follow up Details #1::        Faxed paper req back with md info, also md sent new rx with correct directions Additional Follow-up by: Tomma Lightning,  August 12, 2009 1:32 PM    New/Updated Medications: NASCOBAL 500 MCG/0.1ML SOLN (CYANOCOBALAMIN) 1 spr one nostr q1wk alternate nostrils Prescriptions: NASCOBAL 500 MCG/0.1ML SOLN (CYANOCOBALAMIN) 1 spr one nostr q1wk alternate nostrils  #1 x 6   Entered and Authorized by:   Cassandria Anger MD   Signed by:   Tomma Lightning on 08/12/2009   Method used:   Electronically to        [facility] (retail)       [address]       [place], [place]  [address]       Ph: [phone]       Fax: [phone]   RxID:   [id number]

## 2010-06-10 NOTE — Assessment & Plan Note (Signed)
Summary: 6 MO ROV /NWS  #   Vital Signs:  Patient profile:   50 year old female Height:      65 inches Weight:      301 pounds BMI:     50.27 Temp:     99.0 degrees F oral Pulse rate:   78 / minute Pulse rhythm:   regular Resp:     16 per minute BP sitting:   120 / 82  (left arm) Cuff size:   large  Vitals Entered By: Jonathon Resides, Gabrielle Dare) (January 19, 2010 2:53 PM) CC: 6 mo f/u Is Patient Diabetic? No   CC:  6 mo f/u.  History of Present Illness: F/u B12 C/o palpitations C/o sweaty palms and feet  Current Medications (verified): 1)  Xanax 1 Mg Tabs (Alprazolam) .... 1/2 in Am,   1/2 in Noon As Needed and 39m in Pm 2)  Vitamin D3 2000 Unit Caps (Cholecalciferol) ..Marland Kitchen. 1 Tab By Mouth Once Daily 3)  Loestrin Fe 1/20 1-20 Mg-Mcg Tabs (Norethin Ace-Eth Estrad-Fe) .... Once Daily 4)  Probiotic Formula  Caps (Probiotic Product) ..Marland Kitchen. 1 Tab  Two Times A Day 5)  Atenolol 100 Mg Tabs (Atenolol) ..Marland Kitchen. 1 in Am and 50 in Pm 6)  Ibuprofen 600 Mg  Tabs (Ibuprofen) ..Marland Kitchen. 1 By Mouth Two Times A Day As Needed Pain Pc 7)  Nascobal 500 Mcg/0.154mSoln (Cyanocobalamin) ...Marland Kitchen 1 Spr One Nostr Q1wk Alternate Nostrils  Allergies (verified): 1)  * Antihistamines 2)  Azulfidine (Sulfasalazine) 3)  Benadryl (Diphenhydramine Hcl) 4)  * Oral Steroids 5)  Penicillin V Potassium (Penicillin V Potassium) 6)  Prednisone (Prednisone) 7)  Rowasa 8)  Vitamin B-12 (Cyanocobalamin) 9)  Vitamin D  Past History:  Past Medical History: Current Problems:  ANXIETY (ICD-300.00) PANIC DISORDER (ICD-300.01) CARDIAC ARRHYTHMIA (ICD-427.9) ALLERGY, HX OF (ICD-V15.09) ULCERATIVE COLITIS (ICD-556.9) Personality disorder Morbid obesity Low Vit B12 Vit D def  Review of Systems  The patient denies fever, weight loss, weight gain, chest pain, headaches, and abdominal pain.    Physical Exam  General:  alert and overweight-appearing Nose:  no external erythema and no nasal discharge.   Mouth:  no  gingival abnormalities and pharynx pink and moist.   Neck:  supple., several tender LN like < 1 cm subq knots to right post neck, also one node to post right angle of the jaw, as well as another subq to mid left jawline; no palpable nodes to the pre and post SCM's bilat; no signific axillary LA as well Lungs:  Normal respiratory effort, chest expands symmetrically. Lungs are clear to auscultation, no crackles or wheezes. Heart:  Normal rate and regular rhythm. S1 and S2 normal without gallop, murmur, click, rub or other extra sounds. Abdomen:  Bowel sounds positive,abdomen soft and non-tender without masses, organomegaly or hernias noted. Msk:  No deformity or scoliosis noted of thoracic or lumbar spine. Extremities:  no edema, no ulcers  Neurologic:  No cranial nerve deficits noted. Station and gait are normal. Plantar reflexes are down-going bilaterally. DTRs are symmetrical throughout. Sensory, motor and coordinative functions appear intact. Skin:  swollen erythematous warm tender skin fold prox poster neck Psych:  Cognition and judgment appear intact. Alert and cooperative with normal attention span and concentration. No apparent delusions, illusions, hallucinations   Impression & Recommendations:  Problem # 1:  PALPITATIONS (ICD-785.1) Assessment Deteriorated  Her updated medication list for this problem includes:    Atenolol 100 Mg Tabs (Atenolol) ...Marland Kitchen. 1 in am and 50  in pm  Orders: Cardiology Referral (Cardiology)  Problem # 2:  HYPERHIDROSIS (ICD-780.8) Assessment: Deteriorated Drysol to try  Problem # 3:  VITAMIN B12 DEFICIENCY (ICD-266.2) Assessment: Unchanged On the regimen of medicine(s) reflected in the chart   Orders: TLB-B12, Serum-Total ONLY (41660-Y30) TLB-BMP (Basic Metabolic Panel-BMET) (16010-XNATFTD) TLB-TSH (Thyroid Stimulating Hormone) (84443-TSH) TLB-CBC Platelet - w/Differential (85025-CBCD)  Problem # 4:  ANXIETY (ICD-300.00) Assessment: Unchanged  Her  updated medication list for this problem includes:    Xanax 1 Mg Tabs (Alprazolam) .Marland Kitchen... 1/2 in am,   1/2 in noon as needed and 4m in pm  Complete Medication List: 1)  Xanax 1 Mg Tabs (Alprazolam) .... 1/2 in am,   1/2 in noon as needed and 169min pm 2)  Vitamin D3 2000 Unit Caps (Cholecalciferol) ...Marland Kitchen 1 tab by mouth once daily 3)  Loestrin Fe 1/20 1-20 Mg-mcg Tabs (Norethin ace-eth estrad-fe) .... Once daily 4)  Probiotic Formula Caps (Probiotic product) ...Marland Kitchen 1 tab  two times a day 5)  Atenolol 100 Mg Tabs (Atenolol) ...Marland Kitchen 1 in am and 50 in pm 6)  Ibuprofen 600 Mg Tabs (Ibuprofen) ...Marland Kitchen 1 by mouth two times a day as needed pain pc 7)  Nascobal 500 Mcg/0.76m69moln (Cyanocobalamin) ....Marland Kitchen1 spr one nostr q1wk alternate nostrils 8)  Drysol 20 % Soln (Aluminum chloride) .... As dirrected on palms and feet 9)  Vitamin B-12 1000 Mcg Subl (Cyanocobalamin) ....Marland Kitchen1 by mouth qd  Patient Instructions: 1)  Check on  Deplin and Metanx 2)  Please schedule a follow-up appointment in 3 months. Prescriptions: VITAMIN B-12 1000 MCG SUBL (CYANOCOBALAMIN) 1 by mouth qd  #100 x 3   Entered and Authorized by:   AleCassandria Anger   Signed by:   AleCassandria Anger on 01/19/2010   Method used:   Print then Give to Patient   RxID:   [id number] 20 % SOLN (ALUMINUM CHLORIDE) as dirrected on palms and feet  #1 x 11   Entered and Authorized by:   AleCassandria Anger   Signed by:   AleCassandria Anger on 01/19/2010   Method used:   Print then Give to Patient   RxID:   [id number]

## 2010-06-10 NOTE — Progress Notes (Signed)
Summary: REQ FOR RX  Phone Note Call from Patient Call back at [facility] Phone [phone]   Summary of Call: Patient is requesting rx for zpak. C/o sinus pain & pressure w/discolored mucus.  Initial call taken by: Charlsie Quest, [place],  April 06, 2010 10:17 AM  Follow-up for Phone Call        ok zpac Follow-up by: Cassandria Anger MD,  April 06, 2010 1:15 PM  Additional Follow-up for Phone Call Additional follow up Details #1::        Pt informed  Additional Follow-up by: Charlsie Quest, CMA,  April 06, 2010 4:57 PM    New/Updated Medications: ZITHROMAX Z-PAK 250 MG TABS (AZITHROMYCIN) as directed Prescriptions: ZITHROMAX Z-PAK 250 MG TABS (AZITHROMYCIN) as directed  #1 x 0   Entered by:   Charlsie Quest, CMA   Authorized by:   Cassandria Anger MD   Signed by:   Charlsie Quest, CMA on 04/06/2010   Method used:   Electronically to        [facility] (retail)       [address]       [place], [place]  [address]       Ph: [phone]       Fax: [phone]   RxID:   [id number]

## 2010-06-10 NOTE — Assessment & Plan Note (Signed)
Summary: 3 MO ROV /NWS  #   Vital Signs:  Patient profile:   50 year old female Height:      65 inches Weight:      305 pounds Temp:     99.0 degrees F oral Pulse rate:   76 / minute Pulse rhythm:   regular Resp:     16 per minute BP sitting:   120 / 72  (right arm) Cuff size:   large  Vitals Entered By: Jonathon Resides, Gabrielle Dare) (April 26, 2010 2:57 PM) CC: 3 mo f/u  Is Patient Diabetic? No   CC:  3 mo f/u .  History of Present Illness: F/u Vit B12 def, palpitations  and anxiety   Current Medications (verified): 1)  Xanax 1 Mg Tabs (Alprazolam) .... 1/2 in Am,   1 At Bedtime and As Needed 2)  Vitamin D3 2000 Unit Caps (Cholecalciferol) .Marland Kitchen.. 1 Tab By Mouth Once Daily 3)  Loestrin Fe 1/20 1-20 Mg-Mcg Tabs (Norethin Ace-Eth Estrad-Fe) .... Once Daily 4)  Atenolol 100 Mg Tabs (Atenolol) .Marland Kitchen.. 1 in Am(23m) and 1(512m in Pm 5)  Ibuprofen 600 Mg  Tabs (Ibuprofen) ...Marland Kitchen 1 By Mouth As Needed Pain Pc 6)  Nascobal 500 Mcg/0.38m8moln (Cyanocobalamin) ....Marland Kitchen1 Spr One Nostr Q1wk Alternate Nostrils  Allergies (verified): 1)  * Antihistamines 2)  Azulfidine (Sulfasalazine) 3)  Benadryl (Diphenhydramine Hcl) 4)  * Oral Steroids 5)  Penicillin V Potassium (Penicillin V Potassium) 6)  Prednisone (Prednisone) 7)  Rowasa 8)  Vitamin B-12 (Cyanocobalamin) 9)  Vitamin D  Past History:  Past Medical History: Last updated: 01/19/2010 Current Problems:  ANXIETY (ICD-300.00) PANIC DISORDER (ICD-300.01) CARDIAC ARRHYTHMIA (ICD-427.9) ALLERGY, HX OF (ICD-V15.09) ULCERATIVE COLITIS (ICD-556.9) Personality disorder Morbid obesity Low Vit B12 Vit D def  Social History: Last updated: 12/01/2008 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   Lungs:  Normal respiratory effort, chest expands symmetrically. Lungs are clear to auscultation, no crackles  or wheezes. Heart:  Normal rate and regular rhythm with irregular beats. S1 and S2 normal without gallop, murmur, click, rub or other extra sounds. Abdomen:  Bowel sounds positive,abdomen soft and non-tender without masses, organomegaly or hernias noted. Msk:  No deformity or scoliosis noted of thoracic or lumbar spine. Neurologic:  No cranial nerve deficits noted. Station and gait are normal. Plantar reflexes are down-going bilaterally. DTRs are symmetrical throughout. Sensory, motor and coordinative functions appear intact. There is a slight hand tremor Skin:  swollen erythematous warm tender skin fold prox poster neck Psych:  Cognition and judgment appear intact. Alert and cooperative with normal attention span and concentration. No apparent delusions, illusions, hallucinations   Impression & Recommendations:  Problem # 1:  VITAMIN B12 DEFICIENCY (ICD-266.2) Assessment Improved On the regimen of medicine(s) reflected in the chart    Problem # 2:  VITAMIN D DEFICIENCY (ICD-268.9) Assessment: Unchanged On the regimen of medicine(s) reflected in the chart    Problem # 3:  PALPITATIONS (ICD-785.1) Assessment: Unchanged F/u w/Dr NisJohnsie Cancelr updated medication list for this problem includes:    Atenolol 100 Mg Tabs (Atenolol) ....Marland Kitchen 1 in am(1m55mnd 1(1mg14m pm  Problem # 4:  ANXIETY (ICD-300.00) Assessment: Unchanged  Her updated medication list for this problem includes:    Xanax 1 Mg Tabs (Alprazolam) ..... Marland Kitchen/2 in am,   1 at bedtime and as needed  Complete Medication List: 1)  Xanax  1 Mg Tabs (Alprazolam) .... 1/2 in am,   1 at bedtime and as needed 2)  Vitamin D3 2000 Unit Caps (Cholecalciferol) .Marland Kitchen.. 1 tab by mouth once daily 3)  Loestrin Fe 1/20 1-20 Mg-mcg Tabs (Norethin ace-eth estrad-fe) .... Once daily 4)  Atenolol 100 Mg Tabs (Atenolol) .Marland Kitchen.. 1 in am(49m) and 1(573m in pm 5)  Ibuprofen 600 Mg Tabs (Ibuprofen) ...Marland Kitchen 1 by mouth as needed pain pc 6)  Nascobal 500 Mcg/0.20m19mSoln (Cyanocobalamin) ....Marland Kitchen1 spr one nostr q1wk alternate nostrils  Patient Instructions: 1)  Please schedule a follow-up appointment in 4 months. 2)  BMP prior to visit, ICD-9: 3)  vit B12 266.20   Orders Added: 1)  Est. Patient Level IV [[id number]]

## 2010-06-10 NOTE — Progress Notes (Signed)
Summary: Xanax refill  Phone Note Refill Request Message from:  Fax from Pharmacy on Sep 17, 2009 1:45 PM  Refills Requested: Medication #1:  XANAX 1 MG TABS 1/2 in am Per fax--Sig: take half tab by mouth in am, half tab at lunch, half tab in pm, and half tab at bedtime.  Initial call taken by: Ernestene Mention,  Sep 17, 2009 1:46 PM  Follow-up for Phone Call        ok x 6 Keep return office visit  Pls inform labs - ok - drink more water! Follow-up by: Cassandria Anger MD,  Sep 18, 2009 12:53 PM  Additional Follow-up for Phone Call Additional follow up Details #1::        Pt informed  Additional Follow-up by: Charlsie Quest, CMA,  Sep 18, 2009 2:48 PM    Prescriptions: Duanne Moron 1 MG TABS (ALPRAZOLAM) 1/2 in am,   1/2 in noon as needed and 57m in pm  #75 x 5   Entered by:   SCharlsie Quest CMA   Authorized by:   ACassandria AngerMD   Signed by:   SCharlsie Quest CMA on 09/18/2009   Method used:   Telephoned to ...       [facility] (retail)       [address]      [place]  [address]      Ph: [phone]      Fax: [phone]  RxID:   [id number]

## 2010-06-10 NOTE — Progress Notes (Signed)
Summary: Pt having palp,lightheaded,low heart rates   Phone Note Call from Patient Call back at [facility] Phone [phone]   Caller: Patient Summary of Call: PT HAVING PALP AND LIGHTHEADED,HEART RATE RUNNING LOW 49-51 TODAYAT REST AND YESTERDAY MID 35's Initial call taken by: Delsa Sale,  Sep 10, 2009 12:30 PM  Follow-up for Phone Call        spoke with pt, she has been having spells of lightheadedness. it does not occur everyday but has been happening more freq. she denies dizziness, just states she feel light headed and generally feels bad. she denies sinus issues or vertigo symptoms. she has tried decreasing her atenolol in the past but the PVC's get too bad to cont the decreased dose. her pulse yesterday was in the 50's and at one point her heart rate was in the 40's. her bp is 120/72. she is trying to drink more fluids but thinks she could be  dehydrated. will foward to dr Johnsie Cancel for his review Fredia Beets, RN  Sep 10, 2009 1:31 PM  Follow-up by: Fredia Beets, RN,  Sep 10, 2009 12:54 PM  Additional Follow-up for Phone Call Additional follow up Details #1::        PT calling back about heartrate and medication Delsa Sale  Sep 11, 2009 10:50 AM spoke with pt last night hr HR was 61.  She hydrated herself and cut her medication to Atenolol 50m this a.m amd 517mlast pm. It has gotten some better.  PVC's still bad.  HR was 54 this am  says she is getting ready to start her period and feels some may be due to that.  I have aske her to come by for an EKG and will forward to Dr NiJohnsie Cancel He is in the office on 09/15/09 pt came in for EKG [facility]. pt aware KeJanan HalterRN, BSN  Sep 11, 2009 1:32 PM    Additional Follow-up for Phone Call Additional follow up Details #2::    Ok to cut Atenolol back to 50 two times a day PeBosie ClosMD, FAO[facility] HospitalMay  6, 2011 3:49 PM  Follow-up by: PeBosie ClosMD, [facility] Sep 11, 2009 3:49 PM  Additional  Follow-up for Phone Call Additional follow up Details #3:: Details for Additional Follow-up Action Taken: pt aware, dr niJohnsie Cancelas reviewed the EKG. pt aware everything is fine. she will try cutting the atenolol down and will call with further problems DeFredia BeetsRN  Sep 15, 2009 11:31 AM

## 2010-06-10 NOTE — Assessment & Plan Note (Signed)
Summary: 6 WK ROV /NWS  #   Vital Signs:  Patient profile:   50 year old female Weight:      304 pounds Temp:     98.5 degrees F oral Pulse rate:   59 / minute BP sitting:   122 / 76  (left arm)  Vitals Entered By: Doralee Albino (July 13, 2009 3:02 PM) CC: f/u Is Patient Diabetic? No   CC:  f/u.  History of Present Illness: The patient presents for a follow up of hypertension, diabetes, hyperlipidemia, B12 def. Feeling better   Preventive Screening-Counseling & Management  Alcohol-Tobacco     Smoking Status: quit  Current Medications (verified): 1)  Xanax 1 Mg Tabs (Alprazolam) .... 1/2 in Am,   1/2 in Noon As Needed and 30m in Pm 2)  Vitamin D3 2000 Unit Caps (Cholecalciferol) ..Marland Kitchen. 1 Tab By Mouth Once Daily 3)  Loestrin Fe 1/20 1-20 Mg-Mcg Tabs (Norethin Ace-Eth Estrad-Fe) .... Once Daily 4)  Probiotic Formula  Caps (Probiotic Product) ..Marland Kitchen. 1 Tab  Two Times A Day 5)  Atenolol 100 Mg Tabs (Atenolol) ..Marland Kitchen. 1 in Am and 50 in Pm 6)  Ibuprofen 600 Mg  Tabs (Ibuprofen) ..Marland Kitchen. 1 By Mouth Two Times A Day As Needed Pain Pc 7)  Nascobal 500 Mcg/0.120mSoln (Cyanocobalamin) ...Marland Kitchen 1 Spr Each Nostr Q1wk Alternate Nostrils  Allergies: 1)  * Antihistamines 2)  Azulfidine (Sulfasalazine) 3)  Benadryl (Diphenhydramine Hcl) 4)  * Oral Steroids 5)  Penicillin V Potassium (Penicillin V Potassium) 6)  Prednisone (Prednisone) 7)  Rowasa 8)  Vitamin B-12 (Cyanocobalamin) 9)  Vitamin D  Past History:  Past Medical History: Last updated: 06/30/2008 Current Problems:  ANXIETY (ICD-300.00) PANIC DISORDER (ICD-300.01) CARDIAC ARRHYTHMIA (ICD-427.9) ALLERGY, HX OF (ICD-V15.09) HYPERTENSION (ICD-401.9) ULCERATIVE COLITIS (ICD-556.9) Personality disorder Morbid obesity Low Vit B12 Vit D def  Social History: Last updated: 12/01/2008 Occupation: housewife Married Never Smoked Non-drinker  Physical Exam  General:  alert and overweight-appearing Mouth:  no gingival abnormalities  and pharynx pink and moist.   Neck:  supple., several tender LN like < 1 cm subq knots to right post neck, also one node to post right angle of the jaw, as well as another subq to mid left jawline; no palpable nodes to the pre and post SCM's bilat; no signific axillary LA as well Lungs:  Normal respiratory effort, chest expands symmetrically. Lungs are clear to auscultation, no crackles or wheezes. Heart:  Normal rate and regular rhythm. S1 and S2 normal without gallop, murmur, click, rub or other extra sounds. Abdomen:  Bowel sounds positive,abdomen soft and non-tender without masses, organomegaly or hernias noted. Msk:  No deformity or scoliosis noted of thoracic or lumbar spine. Neurologic:  No cranial nerve deficits noted. Station and gait are normal. Plantar reflexes are down-going bilaterally. DTRs are symmetrical throughout. Sensory, motor and coordinative functions appear intact.   Impression & Recommendations:  Problem # 1:  VITAMIN B12 DEFICIENCY (ICD-266.2) Assessment Improved The labs were reviewed with the patient.  On prescription therapy   Problem # 2:  FATIGUE (ICD-780.79) Assessment: Improved  Problem # 3:  ULCERATIVE COLITIS (ICD-556.9) Assessment: Unchanged Clinically OK  Problem # 4:  Poss E nodosum posterior neck Assessment: Unchanged  Problem # 5:  HYPERTENSION (ICD-401.9) Assessment: Unchanged  Her updated medication list for this problem includes:    Atenolol 100 Mg Tabs (Atenolol) ...Marland Kitchen. 1 in am and 50 in pm  Complete Medication List: 1)  Xanax 1 Mg Tabs (Alprazolam) ...Marland KitchenMarland KitchenMarland Kitchen  1/2 in am,   1/2 in noon as needed and 79m in pm 2)  Vitamin D3 2000 Unit Caps (Cholecalciferol) ..Marland Kitchen. 1 tab by mouth once daily 3)  Loestrin Fe 1/20 1-20 Mg-mcg Tabs (Norethin ace-eth estrad-fe) .... Once daily 4)  Probiotic Formula Caps (Probiotic product) ..Marland Kitchen. 1 tab  two times a day 5)  Atenolol 100 Mg Tabs (Atenolol) ..Marland Kitchen. 1 in am and 50 in pm 6)  Ibuprofen 600 Mg Tabs (Ibuprofen) ..Marland Kitchen.  1 by mouth two times a day as needed pain pc 7)  Nascobal 500 Mcg/0.154mSoln (Cyanocobalamin) ...Marland Kitchen 1 spr each nostr q1wk alternate nostrils  Patient Instructions: 1)  Try a gluten free diet x 1 mo 2)  Please schedule a follow-up appointment in 2 months. 3)  BMP prior to visit, ICD-9:995.20 4)  Vit B12 266.2 5)  hsCRP

## 2010-06-10 NOTE — Progress Notes (Signed)
  Phone Note Call from Patient Call back at Home Phone [phone]   Caller: Patient Call For: Dr Alain Marion Summary of Call: Pt saw Dr Alain Marion, 1/7 -  for lump on back of head. Lump is still there and advised to call if it did not go away. More light headedness this week, glands below jawbone painful, low grade headaches, pt states she is perimenopausal she does not know if  there is any correlation. Please advise. Initial call taken by: Denice Paradise,  May 25, 2009 10:50 AM  Follow-up for Phone Call        Can she come in for OV this week? CBC, ESR, CRP, BMET, LFTs, TSH, vit B12 prior 780.6 266.20. Follow-up by: Cassandria Anger MD,  May 25, 2009 1:12 PM  Additional Follow-up for Phone Call Additional follow up Details #1::        Office visit 1/21 at 9:15 am with labs prior to visit, pt aware. Additional Follow-up by: Denice Paradise,  May 25, 2009 2:27 PM

## 2010-06-10 NOTE — Assessment & Plan Note (Signed)
Summary: knot back of head/cd   Vital Signs:  Patient profile:   50 year old female Weight:      303 pounds Temp:     98.8 degrees F oral Pulse rate:   72 / minute BP sitting:   106 / 76  (left arm)  Vitals Entered By: Doralee Albino (May 15, 2009 4:28 PM) CC: pt c/o knot on the back of her head Is Patient Diabetic? No   CC:  pt c/o knot on the back of her head.  History of Present Illness: R occipital pain and swelling x 1 d.  Preventive Screening-Counseling & Management  Alcohol-Tobacco     Smoking Status: quit  Current Medications (verified): 1)  Xanax 1 Mg Tabs (Alprazolam) .... 1/2 in Am,   1/2 in Noon As Needed and 88m in Pm 2)  Vitamin D3 2000 Unit Caps (Cholecalciferol) ..Marland Kitchen. 1 Tab By Mouth Once Daily 3)  Loestrin 24 Fe 1-20 Mg-Mcg Tabs (Norethin Ace-Eth Estrad-Fe) .... Once Daily 4)  Vitamin B-12 Cr 1000 Mcg  Tbcr (Cyanocobalamin) .... Take One Tablet By Mouth Daily 5)  Probiotic Formula  Caps (Probiotic Product) ..Marland Kitchen. 1 Tab  Two Times A Day 6)  Atenolol 100 Mg Tabs (Atenolol) ..Marland Kitchen. 1 in Am and 50 in Pm 7)  Ibuprofen 600 Mg  Tabs (Ibuprofen) ..Marland Kitchen. 1 By Mouth Two Times A Day As Needed Pain Pc  Allergies: 1)  * Antihistamines 2)  Azulfidine (Sulfasalazine) 3)  Benadryl (Diphenhydramine Hcl) 4)  * Oral Steroids 5)  Penicillin V Potassium (Penicillin V Potassium) 6)  Prednisone (Prednisone) 7)  Rowasa 8)  Vitamin B-12 (Cyanocobalamin) 9)  Vitamin D  Past History:  Past Medical History: Last updated: 06/30/2008 Current Problems:  ANXIETY (ICD-300.00) PANIC DISORDER (ICD-300.01) CARDIAC ARRHYTHMIA (ICD-427.9) ALLERGY, HX OF (ICD-V15.09) HYPERTENSION (ICD-401.9) ULCERATIVE COLITIS (ICD-556.9) Personality disorder Morbid obesity Low Vit B12 Vit D def  Social History: Last updated: 12/01/2008 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    Physical Exam  General:  alert and overweight-appearing Nose:  no external erythema and no nasal discharge.   Mouth:  no gingival abnormalities and pharynx pink and moist.   Lungs:  Normal respiratory effort, chest expands symmetrically. Lungs are clear to auscultation, no crackles or wheezes. Heart:  Normal rate and regular rhythm. S1 and S2 normal without gallop, murmur, click, rub or other extra sounds. Abdomen:  Bowel sounds positive,abdomen soft and non-tender without masses, organomegaly or hernias noted. Msk:  No deformity or scoliosis noted of thoracic or lumbar spine. Neurologic:  No cranial nerve deficits noted. Station and gait are normal. Plantar reflexes are down-going bilaterally. DTRs are symmetrical throughout. Sensory, motor and coordinative functions appear intact. Skin:  swollen erythematous warm tender skin fold prox poster neck Psych:  Cognition and judgment appear intact. Alert and cooperative with normal attention span and concentration. No apparent delusions, illusions, hallucinations   Impression & Recommendations:  Problem # 1:  Scalp cellulitis R occipital area Assessment New Doxy x 10 d  Complete Medication List: 1)  Xanax 1 Mg Tabs (Alprazolam) .... 1/2 in am,   1/2 in noon as needed and 144min pm 2)  Vitamin D3 2000 Unit Caps (Cholecalciferol) ...Marland Kitchen 1 tab by mouth once daily 3)  Loestrin 24 Fe 1-20 Mg-mcg Tabs (Norethin ace-eth estrad-fe) .... Once daily 4)  Vitamin B-12 Cr 1000  Mcg Tbcr (Cyanocobalamin) .... Take one tablet by mouth daily 5)  Probiotic Formula Caps (Probiotic product) .Marland Kitchen.. 1 tab  two times a day 6)  Atenolol 100 Mg Tabs (Atenolol) .Marland Kitchen.. 1 in am and 50 in pm 7)  Ibuprofen 600 Mg Tabs (Ibuprofen) .Marland Kitchen.. 1 by mouth two times a day as needed pain pc 8)  Doxycycline Monohydrate 100 Mg Caps (Doxycycline monohydrate) .Marland Kitchen.. 1 by mouth two times a day with a glass of water  Patient Instructions: 1)  Call if you  are not better in a reasonable amount of time or if worse.  2)  Contour pillow Prescriptions: DOXYCYCLINE MONOHYDRATE 100 MG CAPS (DOXYCYCLINE MONOHYDRATE) 1 by mouth two times a day with a glass of water  #20 x 0   Entered and Authorized by:   Cassandria Anger MD   Signed by:   Cassandria Anger MD on 05/15/2009   Method used:   Electronically to        [facility] ([facility]ail)       [address]       [place], [place]  [address]       Ph: [phone]       Fax: [phone]   RxID:   [id number]

## 2010-06-10 NOTE — Progress Notes (Signed)
Summary: LAb ?  Phone Note Call from Patient   Summary of Call: pt wants Full range CRP checked at next visit. Please advise ok to add to [place] labs??? Initial call taken by: Jonathon Resides, [facility]),  January 20, 2010 10:37 AM  Follow-up for Phone Call        ok 995.20 Follow-up by: Cassandria Anger MD,  January 20, 2010 4:47 PM  Additional Follow-up for Phone Call Additional follow up Details #1::        added to [place] labs Additional Follow-up by: Jonathon Resides, [facility]),  January 21, 2010 7:56 AM

## 2010-06-10 NOTE — Progress Notes (Signed)
  Phone Note Call from Patient   Summary of Call: Pt has apt 01/19/10. She is req labs prior, please advise.  Initial call taken by: Charlsie Quest, CMA,  January 12, 2010 10:29 AM  Follow-up for Phone Call        BMET, TSH, Vit B12  244.8 995.20 266.20 Follow-up by: Cassandria Anger MD,  January 12, 2010 2:54 PM  Additional Follow-up for Phone Call Additional follow up Details #1::        order in idx. Additional Follow-up by: Denice Paradise,  January 13, 2010 8:02 AM

## 2010-06-10 NOTE — Letter (Signed)
Summary: [facility]  [facility]   Imported By: Phillis Knack 06/24/2009 10:08:43  _____________________________________________________________________  External Attachment:    Type:   Image     Comment:   External Document

## 2010-06-10 NOTE — Progress Notes (Signed)
Summary: B 12   Phone Note Call from Patient Call back at [facility] Phone [phone]   Summary of Call: Pt informed regarding results. She has had increase in palpatations especially after using nasal spray. She wants to know if she can try otc b12 tablets again. Per pt b 12 supplement caused same symptom when she was younger.  Initial call taken by: Charlsie Quest, [place],  Sep 18, 2009 2:44 PM  Follow-up for Phone Call        ok to try B12 1000-2000 micrograms a day (try sublingual) Follow-up by: Cassandria Anger MD,  Sep 18, 2009 4:59 PM  Additional Follow-up for Phone Call Additional follow up Details #1::        Pt informed  Additional Follow-up by: Charlsie Quest, CMA,  Sep 18, 2009 5:45 PM

## 2010-06-10 NOTE — Progress Notes (Signed)
Summary: Rf xanax  Phone Note Refill Request Message from:  Fax from Pharmacy  Refills Requested: Medication #1:  XANAX 1 MG TABS 1/2 in am   Dosage confirmed as above?Dosage Confirmed   Supply Requested: 75   Last Refilled: 03/03/2010  Method Requested: Telephone to Pharmacy Initial call taken by: Jonathon Resides, [facility]),  March 25, 2010 10:30 AM  Follow-up for Phone Call        ok 3 ref Follow-up by: Cassandria Anger MD,  March 25, 2010 1:03 PM  Additional Follow-up for Phone Call Additional follow up Details #1::        Rx called to pharmacy Additional Follow-up by: Jonathon Resides, [facility]),  March 25, 2010 2:05 PM    Prescriptions: Duanne Moron 1 MG TABS (ALPRAZOLAM) 1/2 in am,   1 at bedtime and as needed  #75 x 2   Entered by:   Jonathon Resides, [facility])   Authorized by:   Cassandria Anger MD   Signed by:   Jonathon Resides, CMA(AAMA) on 03/25/2010   Method used:   Telephoned to ...       [facility] (retail)       [address]       [place], [place]  [address]       Ph: [phone]       Fax: [phone]   RxID:   [id number]

## 2010-06-10 NOTE — Progress Notes (Signed)
Summary: ENT Referral  Phone Note From Other Clinic   Summary of Call: Dr Jacqulyn Ducking from [facility] ENT) said pt need to see primary care doctor first for dizziness before Dr Constance Holster can see pt. Initial call taken by: Ophelia Charter,  May 17, 2010 2:37 PM  Follow-up for Phone Call        Please help schedule pt office visit w/[facility] for dizzyness eval.  Follow-up by: Charlsie Quest, CMA,  May 17, 2010 4:11 PM  Additional Follow-up for Phone Call Additional follow up Details #1::        Pt states that she mentioned lightheadedness to Dr Camila Li at last office visit. I do not see this in notes, ENT needs documentation before scheduling apt.  Additional Follow-up by: Charlsie Quest, CMA,  May 18, 2010 11:21 AM    Additional Follow-up for Phone Call Additional follow up Details #2::    It was dizziness/lightheadedness. Thank you!  Follow-up by: Cassandria Anger MD,  May 18, 2010 12:53 PM

## 2010-07-22 ENCOUNTER — Telehealth: Payer: Self-pay | Admitting: Internal Medicine

## 2010-07-27 NOTE — Progress Notes (Signed)
Summary: Rf Xanax  Phone Note Refill Request Message from:  Fax from Pharmacy  Refills Requested: Medication #1:  XANAX 1 MG TABS 1/2 in am   Dosage confirmed as above?Dosage Confirmed   Supply Requested: 75   Last Refilled: 06/25/2010  Method Requested: Telephone to Pharmacy Initial call taken by: Jonathon Resides, [facility]),  July 22, 2010 4:59 PM  Follow-up for Phone Call        ok x 3 Follow-up by: Cassandria Anger MD,  July 22, 2010 8:41 PM  Additional Follow-up for Phone Call Additional follow up Details #1::        Rx called to pharmacy Additional Follow-up by: Jonathon Resides, [facility]),  July 23, 2010 5:12 PM    Prescriptions: Duanne Moron 1 MG TABS (ALPRAZOLAM) 1/2 in am,   1 at bedtime and as needed  #75 x 3   Entered by:   Jonathon Resides, [facility])   Authorized by:   Cassandria Anger MD   Signed by:   Jonathon Resides, CMA(AAMA) on 07/23/2010   Method used:   Telephoned to ...       [facility] (retail)       [address]       [place], [place]  [address]       Ph: [phone]       Fax: [phone]   RxID:   [id number]

## 2010-08-24 ENCOUNTER — Other Ambulatory Visit: Payer: Self-pay | Admitting: Internal Medicine

## 2010-08-24 ENCOUNTER — Other Ambulatory Visit (INDEPENDENT_AMBULATORY_CARE_PROVIDER_SITE_OTHER): Payer: 59

## 2010-08-24 DIAGNOSIS — E538 Deficiency of other specified B group vitamins: Secondary | ICD-10-CM

## 2010-08-24 LAB — VITAMIN B12: Vitamin B-12: 282 pg/mL (ref 211–911)

## 2010-08-24 LAB — BASIC METABOLIC PANEL
BUN: 9 mg/dL (ref 6–23)
CO2: 30 mEq/L (ref 19–32)
Chloride: 108 mEq/L (ref 96–112)
Creatinine, Ser: 0.7 mg/dL (ref 0.4–1.2)
Glucose, Bld: 93 mg/dL (ref 70–99)
Potassium: 4.4 mEq/L (ref 3.5–5.1)

## 2010-08-31 ENCOUNTER — Ambulatory Visit: Payer: Self-pay | Admitting: Internal Medicine

## 2010-09-21 NOTE — Letter (Signed)
April 13, 2009     RE:  [name], [name]  MRN:  [id number]  /  DOB:  27-Nov-1960   To Whom It May Concern:   In regard to Cassie [name], the patient has been seen by [facility]  Cardiology.  She has significant palpitations an event monitor was  ordered to rule out paroxysmal atrial fibrillation or SVT.  The patient  had already been on beta-blocker therapy.  It is standard practice for  cardiologist to order a monitor to make sure the patient does not have  significant arrhythmias and to guide medical therapy.  As far as I can  tell, there is no reason why an insurance company should not have  covered this test for a patient who is symptomatic and already on  therapy.   If you have any questions, do not hesitate to contact me.    Sincerely,      [name]. Johnsie Cancel, MD, [facility]  Electronically Signed    PCN/MedQ  DD: 04/13/2009  DT: 04/14/2009  Job #: [id number]

## 2010-09-21 NOTE — Assessment & Plan Note (Signed)
[facility] OFFICE NOTE   NAME:Wegman, NEVAH DALAL                      MRN:          [id number]  DATE:12/01/2006                            DOB:          [birth year]    Ms. Aslin is a 50 year old white female who has ulcerative colitis.  She was diagnosed in 2001, on colonoscopy which was done in  [place].  We saw her in 2004, for a flare up of her colitis which  responded to Rowasa enemas.  She has recently had a flare up which is  related to severe stress due to her mother's progressive neurological  disease.  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  She started having diarrhea, frequent stools,  and urgency as well as some bleeding.  She at the time of the onset of  her symptoms did not take any medication for her ulcerative colitis but  recently put herself back on Azulfidine  500 mg 3 times a day.  She also  has started taking Imodium.  Over the last several days her symptoms  have improved.  We have called her in a Proctocort enema which she has  taken every night but did not feel that it was as good as Rowasa enemas.   MEDICATIONS:  1. Atenolol 100 mg q.a.m., 50 mg h.s.  2. Loestrin birth control pill.  3. Sulfasalazine 500 mg p.o. t.i.d.   PAST HISTORY:  Significant for:  1. Ulcerative colitis.  2. High blood pressure.  3. Allergies.  4. Heart rhythm problems.  5. Panic disorder.  6. Anxiety.   FAMILY HISTORY:  Negative for colon cancer or inflammatory bowel  disease.   SOCIAL HISTORY:  Married, no children.  She is a housewife.  She does  not smoke, does not drink alcohol.   REVIEW OF SYSTEMS:  Positive for eyeglasses, burning of her feet, heart  rhythm problems.  She will hold her atenolol on the day of colonoscopy.   PHYSICAL EXAMINATION:  VITAL SIGNS:  Blood pressure 118/70, pulse 74,  and weight 298 pounds.  GENERAL:  She was alert,  oriented, no distress.  LUNGS:  Clear to auscultation.  COR:  Normal S1, normal S2.  ABDOMEN:  Soft with minimal tenderness in the left lower quadrant.  Upper abdomen was normal.  RECTAL:  Anoscopic exam reveals normal perianal area, normal rectal  tone, inflammatory changes of the rectal ampulla with erythema,  friability, and increased exudate consistent with acute proctitis.  There was no spontaneous bleeding but the mucosa was friable when  touched.   IMPRESSION:  A 50 year old white female with an exacerbation of left  sided ulcerative colitis.   Last colonoscopy in 2001.  She symptoms are moderately severe and  somewhat improved on the cort enemas.   PLAN:  1. Start Rowasa enema one q.h.s. instead of cort enema.  2. Increase Azulfidine to 1 gram b.i.d.  3. May take Imodium.  4. Colonoscopy scheduled.  She will hold her atenolol on the date of      colonoscopy because of a  history of problems with hypotension      during her procedure.     Lowella Bandy. Olevia Perches, MD  Electronically Signed    DMB/MedQ  DD: 12/01/2006  DT: 12/01/2006  Job #: [id number]   cc:   Evie Lacks. Plotnikov, MD

## 2010-09-24 NOTE — Assessment & Plan Note (Signed)
[facility]                                   ON-CALL NOTE   NAME:Barnett, Jovie                        MRN:          [id number]  DATE:02/26/2006                            DOB:          1960/08/24    TIME OF CALL:  10:00 a.m.   PHONE NUMBER:  [phone]   SUBJECTIVE:  Ms. Sid Falcon has a one-week history of sore throat, ear ache  and dry cough.  She saw me yesterday at the [facility] clinic.  After looking  at her oropharynx and doing a physical examination, I felt that most likely  she had some viral pharyngitis.  I made some suggestions with over-the-  counter treatments given the fact that she had PVC and PAC history.  Today  the patient states that her sore throat is better.  She is wondering if she  does need antibiotics.   ASSESSMENT/PLAN:  Patient reassured that this is most likely viral  pharyngitis.  She does have more congestion so I instructed her to use  Guaifenesin.  She may also use Claritin over-the-counter as needed because  she notes that after being in the dusty house, everything started with  itchiness and itchiness in her throat.       Eliezer Lofts, MD      AB/MedQ  DD:  02/26/2006  DT:  02/27/2006  Job #:  [id number]   cc:   Evie Lacks. Plotnikov, MD

## 2010-09-24 NOTE — Assessment & Plan Note (Signed)
[place]                                 ON-CALL NOTE   NAME:[name], Cassie                        MRN:          [id number]  DATE:02/12/2007                            DOB:          10-07-1960    PHONE NUMBER:  [phone].   A patient of Dr. Alain Marion.   The phone call was at 5:41 p.m. on February 12, 2007.   She calls because she has had a severe sore throat for the last 3 days.  Actually had chills and maybe some low-grade fever one or two days ago,  has not had that today.  It really bothers her with swallowing but she  is not having difficulty swallowing, no troubles breathing and she calls  for advice.   PLAN:  I told her this is most likely viral and I counseled her on  supportive measures for the throat discomfort including analgesics and  honey (which she had thought about).  I told her there is a small chance  this could be Strep and she would need evaluation to tell that and  antibiotic treatment if it was positive.  She has had no known Strep  exposure.  Since she is not in any extreme symptoms, no fever or  breathing difficulty right now, I told her she could probably wait for  the morning and call for an appointment if she was not significantly  better in the morning.     Venia Carbon, MD  Electronically Signed    RIL/MedQ  DD: 02/12/2007  DT: 02/13/2007  Job #: [id number]   cc:   Evie Lacks. Plotnikov, MD

## 2010-10-08 ENCOUNTER — Encounter: Payer: Self-pay | Admitting: Internal Medicine

## 2010-10-11 ENCOUNTER — Encounter: Payer: Self-pay | Admitting: Internal Medicine

## 2010-10-11 ENCOUNTER — Telehealth: Payer: Self-pay | Admitting: *Deleted

## 2010-10-11 ENCOUNTER — Ambulatory Visit (INDEPENDENT_AMBULATORY_CARE_PROVIDER_SITE_OTHER): Payer: 59 | Admitting: Internal Medicine

## 2010-10-11 DIAGNOSIS — I499 Cardiac arrhythmia, unspecified: Secondary | ICD-10-CM

## 2010-10-11 DIAGNOSIS — E538 Deficiency of other specified B group vitamins: Secondary | ICD-10-CM

## 2010-10-11 DIAGNOSIS — R002 Palpitations: Secondary | ICD-10-CM

## 2010-10-11 DIAGNOSIS — J209 Acute bronchitis, unspecified: Secondary | ICD-10-CM

## 2010-10-11 DIAGNOSIS — F411 Generalized anxiety disorder: Secondary | ICD-10-CM

## 2010-10-11 MED ORDER — AZITHROMYCIN 250 MG PO TABS: ORAL_TABLET | ORAL | Status: DC

## 2010-10-11 MED ORDER — DOXYCYCLINE HYCLATE 100 MG PO TABS: 100.0000 mg | ORAL_TABLET | Freq: Two times a day (BID) | ORAL | Status: DC

## 2010-10-11 NOTE — Assessment & Plan Note (Signed)
On Rx 

## 2010-10-11 NOTE — Assessment & Plan Note (Addendum)
On rx. She will f/u w/Dr Johnsie Cancel if worse

## 2010-10-11 NOTE — Progress Notes (Signed)
  Subjective:    Patient ID: Cassie [name], female    DOB: 15-Aug-1960, 50 y.o.   MRN: [id number]  HPI  The patient presents for a follow-up of  chronic arrhythmia, B12 def with medicines   HPI  C/o URI sx's x   14 days. C/o ST, cough, weakness. Not better with OTC medicines. Actually, the patient is getting worse. The patient did not sleep last night due to cough.  Review of Systems  Constitutional: Positive for fever, chills and fatigue.  HENT: Positive for congestion, rhinorrhea, sneezing and postnasal drip.   Eyes: Positive for photophobia and pain. Negative for discharge and visual disturbance.  Respiratory: Positive for cough and wheezing.   Positive for chest pain.  Gastrointestinal: Negative for vomiting, abdominal pain, diarrhea and abdominal distention.  Genitourinary: Negative for dysuria and difficulty urinating.  Skin: Negative for rash.  Neurological: Positive for dizziness, weakness and light-headedness.        Review of Systems     Objective:   Physical Exam  Constitutional: She appears well-developed and well-nourished. No distress.       Obese In NAD  HENT:  Head: Normocephalic.  Right Ear: External ear normal.  Left Ear: External ear normal.  Nose: Nose normal.  Mouth/Throat: Oropharynx is clear and moist.  Eyes: Conjunctivae are normal. Pupils are equal, round, and reactive to light. Right eye exhibits no discharge. Left eye exhibits no discharge.  Neck: Normal range of motion. Neck supple. No JVD present. No tracheal deviation present. No thyromegaly present.  Cardiovascular: Normal rate and normal heart sounds.  Exam reveals no gallop.   No murmur heard.      Irreg beats  Pulmonary/Chest: Effort normal. No stridor. No respiratory distress. She has wheezes.  Abdominal: Soft. Bowel sounds are normal. She exhibits no distension and no mass. There is no tenderness. There is no rebound and no guarding.  Musculoskeletal: She exhibits no edema and no  tenderness.  Lymphadenopathy:    She has cervical adenopathy.  Neurological: She displays normal reflexes. No cranial nerve deficit. She exhibits normal muscle tone. Coordination normal.  Skin: No rash noted. No erythema.  Psychiatric: She has a normal mood and affect. Her behavior is normal. Judgment and thought content normal.        Lab Results  Component Value Date   WBC 7.7 01/19/2010   HGB 13.8 01/19/2010   HCT 41.0 01/19/2010   PLT 287.0 01/19/2010   CHOL 175 09/17/2009   TRIG 116.0 09/17/2009   HDL 34.90* 09/17/2009   ALT 18 09/17/2009   AST 19 09/17/2009   NA 144 08/24/2010   K 4.4 08/24/2010   CL 108 08/24/2010   CREATININE 0.7 08/24/2010   BUN 9 08/24/2010   CO2 30 08/24/2010   TSH 2.65 04/21/2010   HGBA1C 6.0 09/14/2006     Assessment & Plan:

## 2010-10-11 NOTE — Telephone Encounter (Signed)
Left vm for pt to check w/her pharm

## 2010-10-11 NOTE — Telephone Encounter (Signed)
Patient requesting alt RX that zpak. She read about med and is very concerned about cardiac side effects. She can not take penicillin. Please advise

## 2010-10-11 NOTE — Telephone Encounter (Signed)
It is difficult to find one w/o potential side effects - Doxycycline to try

## 2010-10-11 NOTE — Assessment & Plan Note (Signed)
Start an Abx

## 2010-10-12 ENCOUNTER — Encounter: Payer: Self-pay | Admitting: *Deleted

## 2010-10-12 ENCOUNTER — Telehealth: Payer: Self-pay | Admitting: *Deleted

## 2010-10-12 NOTE — Telephone Encounter (Signed)
Thx

## 2010-10-12 NOTE — Telephone Encounter (Signed)
FYI - pt's father had paroxysmal A-Fib. (I updated this in family history)

## 2010-10-15 ENCOUNTER — Other Ambulatory Visit: Payer: Self-pay | Admitting: Internal Medicine

## 2010-11-12 ENCOUNTER — Telehealth: Payer: Self-pay | Admitting: *Deleted

## 2010-11-12 NOTE — Telephone Encounter (Signed)
OK to fill this prescription with additional refills x5 Thank you!  

## 2010-11-12 NOTE — Telephone Encounter (Signed)
rec rf req for Alprazolam 1 mg 1/2 in AM, 1/2 at lunch, 1/2 in PM and 1 qhs. # 75. Last filled 10-15-10. Ok to Rf?

## 2010-11-15 MED ORDER — ALPRAZOLAM 1 MG PO TABS: 1.5000 mg | ORAL_TABLET | Freq: Every day | ORAL | Status: DC

## 2011-02-07 ENCOUNTER — Other Ambulatory Visit (INDEPENDENT_AMBULATORY_CARE_PROVIDER_SITE_OTHER): Payer: 59

## 2011-02-07 DIAGNOSIS — I499 Cardiac arrhythmia, unspecified: Secondary | ICD-10-CM

## 2011-02-07 DIAGNOSIS — E538 Deficiency of other specified B group vitamins: Secondary | ICD-10-CM

## 2011-02-07 LAB — MAGNESIUM: Magnesium: 2.1 mg/dL (ref 1.5–2.5)

## 2011-02-07 LAB — COMPREHENSIVE METABOLIC PANEL
ALT: 33 U/L (ref 0–35)
CO2: 27 mEq/L (ref 19–32)
Calcium: 8.7 mg/dL (ref 8.4–10.5)
Chloride: 105 mEq/L (ref 96–112)
Creatinine, Ser: 0.7 mg/dL (ref 0.4–1.2)
GFR: 91.92 mL/min (ref >60.00)
Glucose, Bld: 99 mg/dL (ref 70–99)
Total Bilirubin: 0.4 mg/dL (ref 0.3–1.2)

## 2011-02-08 ENCOUNTER — Telehealth: Payer: Self-pay | Admitting: Cardiovascular Disease

## 2011-02-08 NOTE — Telephone Encounter (Signed)
Pt calling stating that she is having difficulty getting lab results from Dr. Judeen Hammans office. Pt recently had lab work done there and Dr. Johnsie Cancel told pt to get them to fax over the results and pt wanted our office to double check they have been received. Please return pt call to discuss further.

## 2011-02-08 NOTE — Telephone Encounter (Signed)
Spoke with pt, aware labs from dr plotnikov are available for dr nishan's review prior to appt tomorrow Cassie [name]

## 2011-02-09 ENCOUNTER — Encounter: Payer: Self-pay | Admitting: Cardiovascular Disease

## 2011-02-09 ENCOUNTER — Ambulatory Visit (INDEPENDENT_AMBULATORY_CARE_PROVIDER_SITE_OTHER): Payer: 59 | Admitting: Cardiovascular Disease

## 2011-02-09 DIAGNOSIS — E669 Obesity, unspecified: Secondary | ICD-10-CM

## 2011-02-09 DIAGNOSIS — R1012 Left upper quadrant pain: Secondary | ICD-10-CM

## 2011-02-09 DIAGNOSIS — R002 Palpitations: Secondary | ICD-10-CM

## 2011-02-09 DIAGNOSIS — F411 Generalized anxiety disorder: Secondary | ICD-10-CM

## 2011-02-09 NOTE — Assessment & Plan Note (Signed)
Stable.  Continue as needed BZ's

## 2011-02-09 NOTE — Assessment & Plan Note (Signed)
Discussed low carb diet and exercise program.  She has not been motivated in the past to quit.  Would be a candidate for bariatric surgery

## 2011-02-09 NOTE — Assessment & Plan Note (Signed)
Stable.  No arrhythmia on event monitor  Normal ECG.  May fluctuate as she goes through menapause and changes hormones.  Observe

## 2011-02-09 NOTE — Patient Instructions (Signed)
Your physician wants you to follow-up in: YEAR WITH DR NISHAN  You will receive a reminder letter in the mail two months in advance. If you don't receive a letter, please call our office to schedule the follow-up appointment.  Your physician recommends that you continue on your current medications as directed. Please refer to the Current Medication list given to you today. 

## 2011-02-09 NOTE — Progress Notes (Signed)
Cassie [name] continue to be anxious and complain of dizzyness and palpitations. She had an event monitor earlier this year with only one PVC and no arrythmia. She seems to think she is running a lower heart rate and I told her not to take her atenolol in the afternoon if her pulse is in the 60s. She continues to be overweight and go through menapause. She thinks changing her BCP to generic may have worsened her palpitations. Her dizzyness is vague. She is nonpostural. She has been seen by ENT and has no vertigo. She has had an ill defined elevated sed rate in the 40's and this probably needs further w.u by primary  Reviewed labs from Monday and B12 in normal range now and rest of CMBMET is normal.  ESR not checked   ROS: Denies fever, malais, weight loss, blurry vision, decreased visual acuity, cough, sputum, SOB, hemoptysis, pleuritic pain, palpitaitons, heartburn, abdominal pain, melena, lower extremity edema, claudication, or rash.  All other systems reviewed and negative  General: Affect appropriate Healthy:  appears stated age 50: normal Neck supple with no adenopathy JVP normal no bruits no thyromegaly Lungs clear with no wheezing and good diaphragmatic motion Heart:  S1/S2 no murmur,rub, gallop or click PMI normal Abdomen: benighn, BS positve, no tenderness, no AAA no bruit.  No HSM or HJR Distal pulses intact with no bruits No edema Neuro non-focal Skin warm and dry No muscular weakness   Current Outpatient Prescriptions  Medication Sig Dispense Refill  . ALPRAZolam (XANAX) 1 MG tablet Take 1.5 tablets (1.5 mg total) by mouth daily. Take 1/2 tab in Am, 1 tab at bedtime & as needed.  75 tablet  5  . atenolol (TENORMIN) 100 MG tablet 22m every morning, 533min evening.      . Cholecalciferol 2000 UNITS CAPS Take 1 capsule by mouth daily.        . Cyanocobalamin (NASCOBAL) 500 MCG/0.1ML SOLN 20 mcg by Nasal route once a week. 1 spray to each nostril       . ibuprofen (ADVIL,MOTRIN)  600 MG tablet Take 600 mg by mouth as needed. For pain      . norethindrone-ethinyl estradiol (MICROGESTIN 1/20) 1-20 MG-MCG per tablet Take 1 tablet by mouth daily.        . Marland KitchenISCONTD: atenolol (TENORMIN) 50 MG tablet TAKE TWO TABLETS BY MOUTH IN THE MORNING AND ONE IN THE P.M.  90 tablet  4    Allergies  Diphenhydramine hcl; Mesalamine; Penicillins; Prednisone; Sulfasalazine; Vitamin b12; and Vitamin d  Electrocardiogram: NSR 66 normal ECG  Assessment and Plan

## 2011-02-11 ENCOUNTER — Ambulatory Visit: Payer: 59 | Admitting: Internal Medicine

## 2011-02-11 ENCOUNTER — Telehealth: Payer: Self-pay | Admitting: *Deleted

## 2011-02-11 NOTE — Telephone Encounter (Signed)
Please help set pt up for F/u OV in 3 to 4 mths, THANK YOU!

## 2011-02-11 NOTE — Telephone Encounter (Signed)
Pt left vm stating she could not keep OV today. She states she was seen by Dr Johnsie Cancel on wed and states he informed her of her recent lab results. Pt wants to know if she needs to reschedule sooner than 3-4 more months. Please advise

## 2011-02-11 NOTE — Telephone Encounter (Signed)
3-4 mo is ok Thx

## 2011-03-02 ENCOUNTER — Other Ambulatory Visit: Payer: Self-pay | Admitting: Internal Medicine

## 2011-03-11 ENCOUNTER — Telehealth: Payer: Self-pay | Admitting: Cardiovascular Disease

## 2011-03-11 NOTE — Telephone Encounter (Signed)
Called stating her GYN doctor changed her BCP to Femhat. Started taking that last Sunday. All week she has felt bad. Hr has been in low to mid 50's, normally in mid 60's. Also has felt like she was going to pass out. Has spoken with her GYN and she told her to call us. Spoke w/Dr. Burt Knack (DOD). He suggests that she decrease her Atenolol from 75 mg in AM to 50 mg in AM; and takes 50 mg in PM to 25 mg. Advised that if HR goes up and she feels better can return to normal dose.

## 2011-03-11 NOTE — Telephone Encounter (Signed)
Pt just taken off birth control pills and needs to be put on hormone therapy, needs to know what would be ok to take with heart meds she's taking?

## 2011-03-16 ENCOUNTER — Other Ambulatory Visit: Payer: Self-pay | Admitting: Internal Medicine

## 2011-04-01 ENCOUNTER — Encounter: Payer: Self-pay | Admitting: Internal Medicine

## 2011-04-01 ENCOUNTER — Ambulatory Visit (INDEPENDENT_AMBULATORY_CARE_PROVIDER_SITE_OTHER): Payer: 59 | Admitting: Internal Medicine

## 2011-04-01 VITALS — BP 122/74 | HR 58 | Temp 97.5°F | Wt 302.0 lb

## 2011-04-01 DIAGNOSIS — F411 Generalized anxiety disorder: Secondary | ICD-10-CM

## 2011-04-01 DIAGNOSIS — E559 Vitamin D deficiency, unspecified: Secondary | ICD-10-CM

## 2011-04-01 DIAGNOSIS — R002 Palpitations: Secondary | ICD-10-CM

## 2011-04-01 DIAGNOSIS — R42 Dizziness and giddiness: Secondary | ICD-10-CM

## 2011-04-01 DIAGNOSIS — E538 Deficiency of other specified B group vitamins: Secondary | ICD-10-CM

## 2011-04-01 DIAGNOSIS — Z0000 Encounter for general adult medical examination without abnormal findings: Secondary | ICD-10-CM

## 2011-04-01 MED ORDER — MECLIZINE HCL 12.5 MG PO TABS: 12.5000 mg | ORAL_TABLET | Freq: Three times a day (TID) | ORAL | Status: DC | PRN

## 2011-04-01 NOTE — Assessment & Plan Note (Signed)
Continue with current prescription therapy as reflected on the Med list.  

## 2011-04-01 NOTE — Patient Instructions (Signed)
You have Benign Positional Vertigo symptoms Start Meclizine. Start Laruth Bouchard - Daroff exercise several times a day as dirrected.

## 2011-04-01 NOTE — Progress Notes (Signed)
  Subjective:    Patient ID: Cassie [name], female    DOB: 1961/02/27, 50 y.o.   MRN: [id number]  HPI The patient presents for a follow-up of  Chronic palpitations, anxiety, B12 def controlled with medicines  Review of Systems  Constitutional: Negative for chills, activity change, appetite change, fatigue and unexpected weight change.  HENT: Negative for congestion, mouth sores and sinus pressure.   Eyes: Negative for visual disturbance.  Respiratory: Negative for cough and chest tightness.   Cardiovascular: Positive for palpitations.  Gastrointestinal: Negative for nausea and abdominal pain.  Genitourinary: Negative for frequency, difficulty urinating and vaginal pain.  Musculoskeletal: Negative for back pain and gait problem.  Skin: Negative for pallor and rash.  Neurological: Positive for dizziness and light-headedness. Negative for tremors, weakness, numbness and headaches.  Psychiatric/Behavioral: Negative for confusion and sleep disturbance.       Objective:   Physical Exam  Constitutional: She appears well-developed and well-nourished. No distress.  HENT:  Head: Normocephalic.  Right Ear: External ear normal.  Left Ear: External ear normal.  Nose: Nose normal.  Mouth/Throat: Oropharynx is clear and moist.  Eyes: Conjunctivae are normal. Pupils are equal, round, and reactive to light. Right eye exhibits no discharge. Left eye exhibits no discharge.  Neck: Normal range of motion. Neck supple. No JVD present. No tracheal deviation present. No thyromegaly present.  Cardiovascular: Normal rate, regular rhythm and normal heart sounds.   Pulmonary/Chest: No stridor. No respiratory distress. She has no wheezes.  Abdominal: Soft. Bowel sounds are normal. She exhibits no distension and no mass. There is no tenderness. There is no rebound and no guarding.  Musculoskeletal: She exhibits no edema and no tenderness.  Lymphadenopathy:    She has no cervical adenopathy.  Neurological:  She displays normal reflexes. No cranial nerve deficit. She exhibits normal muscle tone. Coordination normal.  Skin: No rash noted. No erythema.  Psychiatric: She has a normal mood and affect. Her behavior is normal. Judgment and thought content normal.    Lab Results  Component Value Date   WBC 7.7 01/19/2010   HGB 13.8 01/19/2010   HCT 41.0 01/19/2010   PLT 287.0 01/19/2010   GLUCOSE 99 02/07/2011   CHOL 175 09/17/2009   TRIG 116.0 09/17/2009   HDL 34.90* 09/17/2009   LDLCALC 117* 09/17/2009   ALT 33 02/07/2011   AST 29 02/07/2011   NA 140 02/07/2011   K 4.2 02/07/2011   CL 105 02/07/2011   CREATININE 0.7 02/07/2011   BUN 10 02/07/2011   CO2 27 02/07/2011   TSH 2.65 04/21/2010   HGBA1C 6.0 09/14/2006         Assessment & Plan:

## 2011-04-01 NOTE — Assessment & Plan Note (Addendum)
Chronic. S/p ENT eval Start Meclizine. Start Laruth Bouchard - Daroff exercise several times a day as dirrected.

## 2011-04-07 ENCOUNTER — Telehealth: Payer: Self-pay | Admitting: Cardiovascular Disease

## 2011-04-07 DIAGNOSIS — R002 Palpitations: Secondary | ICD-10-CM

## 2011-04-07 NOTE — Telephone Encounter (Signed)
New message:  Pt was seen 3 wks ago but since that time she having a lot of abnormal skipping esp. Today.  She feels funny because it has been going on for so long this am.  This is off and on this am. Please give her a call back regarding same.

## 2011-04-15 NOTE — Telephone Encounter (Signed)
Pt called and would like to have a monitor ordered for her.  Her rhythm is really bothering her.  Makes her dizzy, tired, it is none stop.  Please call her as soon as possible regarding this problem.

## 2011-04-15 NOTE — Telephone Encounter (Signed)
PT AWARE WILL FORWARD TO DR Johnsie Cancel FOR REVIEW.   PT COMPLAINING OF FREQUENT IRREGULARITIES  SINCE LAST THURS  AND ALSO  DIZZY IS REQUESTING MONITOR   HR  RUNNING 50-70'S  NO CHANGES IN DIET OR MEDS  IS VERY CONCERNED MAY HAVE LIFE THREATENING  ARRYTHMIA ./CY

## 2011-04-15 NOTE — Telephone Encounter (Signed)
Fu call Pt calling back she has not heard anything back please call

## 2011-04-16 NOTE — Telephone Encounter (Signed)
Ok to give event monitor and F/U with me or Scott in 6 weeks

## 2011-04-18 ENCOUNTER — Other Ambulatory Visit: Payer: Self-pay | Admitting: Internal Medicine

## 2011-04-18 NOTE — Telephone Encounter (Signed)
PT AWARE  APPT MADE FOR 05-31-11 WITH DR Johnsie Cancel MAY NEED TO RESCHEDULE PENDING PT'S COLD TO GET MONITOR .HAS SEVERE COUGH AT THIS TIME/CY

## 2011-04-20 ENCOUNTER — Telehealth: Payer: Self-pay

## 2011-04-20 NOTE — Telephone Encounter (Signed)
Monitor will be mail to patient home by [place].

## 2011-04-27 ENCOUNTER — Encounter: Payer: Self-pay | Admitting: Cardiovascular Disease

## 2011-04-27 ENCOUNTER — Telehealth: Payer: Self-pay | Admitting: Cardiovascular Disease

## 2011-04-27 NOTE — Telephone Encounter (Signed)
Or after 1p [phone], has questions re heart monitor

## 2011-04-28 NOTE — Telephone Encounter (Signed)
Pt calling again re machine beeping all the time and was told to call you to see if you can reset it for her, [phone]

## 2011-04-29 ENCOUNTER — Telehealth: Payer: Self-pay | Admitting: *Deleted

## 2011-04-29 MED ORDER — ALPRAZOLAM 1 MG PO TABS: 1.5000 mg | ORAL_TABLET | Freq: Every day | ORAL | Status: DC

## 2011-04-29 NOTE — Telephone Encounter (Signed)
SPOKE WITH PT RE MONITOR RESULTS PER DR NISHAN MAKE SURE  PT IS ON BETA BLOCKER  AND  F/U WITH EP  PER PT HAS BEEN ON ATENOLOL FOR YEARS AND HAS SEEN DR Caryl Comes IN PAST  DOES NOT WISH  TO F/U WITH DR Lequita Halt AT THIS TIME. PT NOT SURE IF FEELING  BAD  BECAUSE OF PVC'S OR  HORMONAL. WILL CONT TO WEAR MONITOR AND  KEEP F/U WITH DR Johnsie Cancel AS SCHEDULED . Adonis Housekeeper

## 2011-04-29 NOTE — Telephone Encounter (Signed)
OK to fill this prescription with additional refills x3 Thank you!

## 2011-04-29 NOTE — Telephone Encounter (Signed)
Rf req for Alprazolam 1 mg. Last filled 04-01-11 ok to Rf?

## 2011-05-31 ENCOUNTER — Ambulatory Visit: Payer: 59 | Admitting: Cardiovascular Disease

## 2011-06-14 ENCOUNTER — Encounter: Payer: Self-pay | Admitting: Cardiovascular Disease

## 2011-06-14 ENCOUNTER — Ambulatory Visit (INDEPENDENT_AMBULATORY_CARE_PROVIDER_SITE_OTHER): Payer: 59 | Admitting: Cardiovascular Disease

## 2011-06-14 DIAGNOSIS — I499 Cardiac arrhythmia, unspecified: Secondary | ICD-10-CM

## 2011-06-14 MED ORDER — NEBIVOLOL HCL 10 MG PO TABS: 10.0000 mg | ORAL_TABLET | Freq: Every day | ORAL | Status: DC

## 2011-06-14 NOTE — Assessment & Plan Note (Signed)
Discussed weight loss, low carb diet and exercise

## 2011-06-14 NOTE — Patient Instructions (Signed)
Your physician wants you to follow-up in: [place] will receive a reminder letter in the mail two months in advance. If you don't receive a letter, please call our office to schedule the follow-up appointment. Your physician has recommended you make the following change in your medication: STOP ATENOLOL START BYSTOLIC 10 MG 1 EVERY DAY

## 2011-06-14 NOTE — Assessment & Plan Note (Signed)
Benign PVC;s  Continue BCP as hormonal cycles make them worse.  Try bystolic instead of atenolol.

## 2011-06-14 NOTE — Progress Notes (Signed)
Cassie [name] continue to be anxious and complain of dizzyness and palpitations. She had an event monitor earlier this year with only one PVC and no arrythmia. She seems to think she is running a lower heart rate and I told her not to take her atenolol in the afternoon if her pulse is in the 60s. She continues to be overweight and go through menapause. She thinks changing her BCP to generic may have worsened her palpitations. Her dizzyness is vague. She is nonpostural. She has been seen by ENT and has no vertigo. She has had an ill defined elevated sed rate in the 40's and this probably needs further w.u by primary  Reviewed labs from Monday and B12 in normal range now and rest of CMBMET is normal. ESR not checked   Last echo 12/11/08 normal  Looke at over 75 pages of event monitor and only occasional PVC;s and many instances of palpitations with no arrythmia  Has been on Atenolol for over 15 years and may benefit from trial different beta blocker.    Has seen [facility] in past and he advised no antiarrhythmics and no ablation  ROS: Denies fever, malais, weight loss, blurry vision, decreased visual acuity, cough, sputum, SOB, hemoptysis, pleuritic pain, palpitaitons, heartburn, abdominal pain, melena, lower extremity edema, claudication, or rash.  All other systems reviewed and negative  General: Affect appropriate Obese anxious femal HEENT: normal Neck supple with no adenopathy JVP normal no bruits no thyromegaly Lungs clear with no wheezing and good diaphragmatic motion Heart:  S1/S2 no murmur, no rub, gallop or click PMI normal Abdomen: benighn, BS positve, no tenderness, no AAA no bruit.  No HSM or HJR Distal pulses intact with no bruits No edema Neuro non-focal Skin warm and dry No muscular weakness   Current Outpatient Prescriptions  Medication Sig Dispense Refill  . ALPRAZolam (XANAX) 1 MG tablet Take 1.5 tablets (1.5 mg total) by mouth daily. Take 1/2 tab in Am, 1 tab at bedtime & as needed.   75 tablet  3  . atenolol (TENORMIN) 100 MG tablet 36m every morning, 532min evening.      . Cholecalciferol 2000 UNITS CAPS Take 1 capsule by mouth daily.        . Marland Kitchenbuprofen (ADVIL,MOTRIN) 600 MG tablet TAKE 1 TABLET BY MOUTH TWICE A DAY AFTER MEALS AS NEEDED FOR PAIN  60 tablet  1  . NASCOBAL 500 MCG/0.1ML SOLN USE 1 SPRAY IN ONE NOSTRIL ONE TIME WEEKLY.  ALTERNATE NOSTRILS WEEKLYAS DIRECTED  3 mL  5  . norethindrone-ethinyl estradiol (MICROGESTIN 1/20) 1-20 MG-MCG per tablet Take 1 tablet by mouth daily.        . Marland KitchenISCONTD: atenolol (TENORMIN) 50 MG tablet TAKE TWO TABLETS BY MOUTH EVERY MORNING AND 1 TABLET IN THE PM  90 tablet  3    Allergies  Diphenhydramine hcl; Mesalamine; Penicillins; Prednisone; Sulfasalazine; Vitamin b12; and Vitamin d  Electrocardiogram:  Assessment and Plan

## 2011-06-27 ENCOUNTER — Other Ambulatory Visit: Payer: Self-pay | Admitting: Internal Medicine

## 2011-08-19 ENCOUNTER — Telehealth: Payer: Self-pay | Admitting: *Deleted

## 2011-08-19 NOTE — Telephone Encounter (Signed)
Pt left vm c/o light headed and HA. She is going on vacation in 2 wks. She is requesting referral to someone for this.

## 2011-08-20 NOTE — Telephone Encounter (Signed)
OV w/any MD Thx

## 2011-08-22 NOTE — Telephone Encounter (Signed)
The pt called and was requesting the name of the dr who she saw at the ENT office at the beginning of the year.  The only name I was able to find in her chart is [name] at [facility].  She states she is going to call the office and see if they are able to find the dr she saw previously.  I instructed her to call the office back if she has any other questions or concerns.

## 2011-08-22 NOTE — Telephone Encounter (Signed)
Pt offered OV with any MD by Hoyle Sauer ([facility] scheduler)and declines. She wants to see ENT. Closing phone note.

## 2011-08-30 ENCOUNTER — Telehealth: Payer: Self-pay | Admitting: *Deleted

## 2011-08-30 NOTE — Telephone Encounter (Signed)
OK to fill this prescription with additional refills x 6 mo Thank you!

## 2011-08-30 NOTE — Telephone Encounter (Signed)
Rf req for Alprazolam 1 mg 1.5 po qd # 75. Ok to Rf?

## 2011-08-31 MED ORDER — ALPRAZOLAM 1 MG PO TABS: 1.5000 mg | ORAL_TABLET | Freq: Every day | ORAL | Status: DC

## 2011-08-31 NOTE — Telephone Encounter (Signed)
Done

## 2011-09-01 ENCOUNTER — Other Ambulatory Visit: Payer: Self-pay | Admitting: Internal Medicine

## 2011-09-13 ENCOUNTER — Ambulatory Visit (INDEPENDENT_AMBULATORY_CARE_PROVIDER_SITE_OTHER): Payer: 59 | Admitting: Internal Medicine

## 2011-09-13 ENCOUNTER — Encounter: Payer: Self-pay | Admitting: Internal Medicine

## 2011-09-13 VITALS — BP 118/76 | HR 80 | Resp 16 | Wt 304.0 lb

## 2011-09-13 DIAGNOSIS — F411 Generalized anxiety disorder: Secondary | ICD-10-CM

## 2011-09-13 DIAGNOSIS — J309 Allergic rhinitis, unspecified: Secondary | ICD-10-CM | POA: Insufficient documentation

## 2011-09-13 DIAGNOSIS — E538 Deficiency of other specified B group vitamins: Secondary | ICD-10-CM

## 2011-09-13 DIAGNOSIS — R42 Dizziness and giddiness: Secondary | ICD-10-CM

## 2011-09-13 DIAGNOSIS — R002 Palpitations: Secondary | ICD-10-CM

## 2011-09-13 DIAGNOSIS — E559 Vitamin D deficiency, unspecified: Secondary | ICD-10-CM

## 2011-09-13 MED ORDER — LORATADINE 10 MG PO TABS: 10.0000 mg | ORAL_TABLET | Freq: Every day | ORAL | Status: DC

## 2011-09-13 NOTE — Assessment & Plan Note (Signed)
Chronic. 

## 2011-09-13 NOTE — Assessment & Plan Note (Signed)
Continue with current prescription therapy as reflected on the Med list.  

## 2011-09-13 NOTE — Assessment & Plan Note (Signed)
Better lately

## 2011-09-13 NOTE — Progress Notes (Signed)
Patient ID: Cassie [name], female   DOB: [birth year], 51 y.o.   MRN: [id number]  Subjective:    Patient ID: Cassie [name], female    DOB: [birth year], 51 y.o.   MRN: [id number]  HPI The patient presents for a follow-up of chronic palpitations - better, anxiety, B12 def controlled with medicines F/u vertigo and lightheadedness - she saw Dr Constance Holster ENT and Dr Baltazar Najjar Neurol recentely  Wt Readings from Last 3 Encounters:  09/13/11 304 lb (137.893 kg)  06/14/11 293 lb (132.904 kg)  04/01/11 302 lb (136.986 kg)   BP Readings from Last 3 Encounters:  09/13/11 118/76  06/14/11 118/62  04/01/11 122/74      Review of Systems  Constitutional: Negative for chills, activity change, appetite change, fatigue and unexpected weight change.  HENT: Negative for congestion, mouth sores and sinus pressure.   Eyes: Negative for visual disturbance.  Respiratory: Negative for cough and chest tightness.   Cardiovascular: Positive for palpitations.  Gastrointestinal: Negative for nausea and abdominal pain.  Genitourinary: Negative for frequency, difficulty urinating and vaginal pain.  Musculoskeletal: Negative for back pain and gait problem.  Skin: Negative for pallor and rash.  Neurological: Positive for dizziness and light-headedness. Negative for tremors, weakness, numbness and headaches.  Psychiatric/Behavioral: Negative for confusion and sleep disturbance.       Objective:   Physical Exam  Constitutional: She appears well-developed. No distress.       Obese  HENT:  Head: Normocephalic.  Right Ear: External ear normal.  Left Ear: External ear normal.  Nose: Nose normal.  Mouth/Throat: Oropharynx is clear and moist.  Eyes: Conjunctivae are normal. Pupils are equal, round, and reactive to light. Right eye exhibits no discharge. Left eye exhibits no discharge.  Neck: Normal range of motion. Neck supple. No JVD present. No tracheal deviation present. No thyromegaly present.  Cardiovascular:  Normal rate, regular rhythm and normal heart sounds.   Pulmonary/Chest: No stridor. No respiratory distress. She has no wheezes.  Abdominal: Soft. Bowel sounds are normal. She exhibits no distension and no mass. There is no tenderness. There is no rebound and no guarding.  Musculoskeletal: She exhibits no edema and no tenderness.  Lymphadenopathy:    She has no cervical adenopathy.  Neurological: She displays normal reflexes. No cranial nerve deficit. She exhibits normal muscle tone. Coordination normal.  Skin: No rash noted. No erythema.  Psychiatric: She has a normal mood and affect. Her behavior is normal. Judgment and thought content normal.    Lab Results  Component Value Date   WBC 7.7 01/19/2010   HGB 13.8 01/19/2010   HCT 41.0 01/19/2010   PLT 287.0 01/19/2010   GLUCOSE 99 02/07/2011   CHOL 175 09/17/2009   TRIG 116.0 09/17/2009   HDL 34.90* 09/17/2009   LDLCALC 117* 09/17/2009   ALT 33 02/07/2011   AST 29 02/07/2011   NA 140 02/07/2011   K 4.2 02/07/2011   CL 105 02/07/2011   CREATININE 0.7 02/07/2011   BUN 10 02/07/2011   CO2 27 02/07/2011   TSH 2.65 04/21/2010   HGBA1C 6.0 09/14/2006         Assessment & Plan:

## 2011-09-13 NOTE — Assessment & Plan Note (Signed)
Claritin prn

## 2011-09-13 NOTE — Assessment & Plan Note (Signed)
Recurrent -- 5/13 s/p  ENT Dr Constance Holster and Neurol Dr Baltazar Najjar evaluation I offered her a sleep study - she will think about it

## 2011-09-30 ENCOUNTER — Telehealth: Payer: Self-pay | Admitting: Internal Medicine

## 2011-09-30 NOTE — Telephone Encounter (Incomplete)
Caller: Cassie [name]/Patient; PCP: Plotnikov, Alex; CB#: [phone];  Call regarding Cough/Congestion- onset 09/26/11; Last took Ibuprofen 600 mgs @ 0930 and helping some. She is having pressure in sinus, and headaches on and off. Occasional cough with colored sputum. Triage per URI Guideline and see Provider within 24 hours recommended for "productive cough with colored sputem"  Patient refused Appnt-will go to UC is sx worsen.

## 2011-09-30 NOTE — Telephone Encounter (Signed)
Use over-the-counter  "cold" medicines  Such as " Delsym" or" Robitussin" cough syrup varietis for cough.  You can use plain "Tylenol" or "Advil" for fever, chills and achyness.   "Common cold" symptoms are usually triggered by a virus.  The antibiotics are usually not necessary. On average, a" viral cold" illness would take 4-7 days to resolve. Please, make an appointment if you are not better or if you're worse.  Sat clinic if not better  Thx

## 2011-09-30 NOTE — Telephone Encounter (Signed)
Caller: Tanique/Patient; PCP: Plotnikov, Alex; CB#: [phone];  Call regarding Cough/Congestion- onset 09/26/11; Last took Ibuprofen 600 mgs @ 0930 and helping some. She is having pressure in sinus, and headaches on and off. Occasional cough with colored sputum. Triage per URI Guideline and see Provider within 24 hours recommended for "productive cough with colored sputum"  Patient refused Appnt-will go to UC is sx worsen.

## 2011-10-16 ENCOUNTER — Other Ambulatory Visit: Payer: Self-pay | Admitting: Internal Medicine

## 2011-11-07 HISTORY — PX: BREAST BIOPSY: SHX20

## 2011-11-17 ENCOUNTER — Other Ambulatory Visit: Payer: Self-pay | Admitting: Radiology

## 2011-12-29 ENCOUNTER — Other Ambulatory Visit: Payer: Self-pay | Admitting: Internal Medicine

## 2012-01-24 ENCOUNTER — Telehealth: Payer: Self-pay | Admitting: Internal Medicine

## 2012-01-24 NOTE — Telephone Encounter (Signed)
Pt requesting bloodwork for Nov appt---Pt like bloodwork done this wk--pt going out of town---pt desire urine test to rule out urinary tract infection---

## 2012-01-24 NOTE — Telephone Encounter (Signed)
Ok TSH, CMET, UA, B12, Vit D, CBC Thx

## 2012-01-27 ENCOUNTER — Other Ambulatory Visit (INDEPENDENT_AMBULATORY_CARE_PROVIDER_SITE_OTHER): Payer: 59

## 2012-01-27 ENCOUNTER — Other Ambulatory Visit: Payer: Self-pay | Admitting: *Deleted

## 2012-01-27 DIAGNOSIS — E538 Deficiency of other specified B group vitamins: Secondary | ICD-10-CM

## 2012-01-27 DIAGNOSIS — Z0000 Encounter for general adult medical examination without abnormal findings: Secondary | ICD-10-CM

## 2012-01-27 DIAGNOSIS — R42 Dizziness and giddiness: Secondary | ICD-10-CM

## 2012-01-27 LAB — BASIC METABOLIC PANEL
CO2: 25 mEq/L (ref 19–32)
Chloride: 109 mEq/L (ref 96–112)
Potassium: 4.6 mEq/L (ref 3.5–5.1)
Sodium: 142 mEq/L (ref 135–145)

## 2012-01-27 LAB — CBC WITH DIFFERENTIAL/PLATELET
Basophils Relative: 0.4 % (ref 0.0–3.0)
Eosinophils Absolute: 0.4 10*3/uL (ref 0.0–0.7)
MCHC: 32.7 g/dL (ref 30.0–36.0)
MCV: 94.5 fl (ref 78.0–100.0)
Monocytes Absolute: 0.5 10*3/uL (ref 0.1–1.0)
Neutro Abs: 5.5 10*3/uL (ref 1.4–7.7)
Neutrophils Relative %: 61.3 % (ref 43.0–77.0)
RBC: 4.26 Mil/uL (ref 3.87–5.11)

## 2012-01-27 LAB — URINALYSIS, ROUTINE W REFLEX MICROSCOPIC
Nitrite: NEGATIVE
Specific Gravity, Urine: 1.015 (ref 1.000–1.030)
Total Protein, Urine: NEGATIVE
Urine Glucose: NEGATIVE

## 2012-01-27 LAB — HEPATIC FUNCTION PANEL
ALT: 19 U/L (ref 0–35)
AST: 18 U/L (ref 0–37)
Alkaline Phosphatase: 61 U/L (ref 39–117)
Bilirubin, Direct: 0.1 mg/dL (ref 0.0–0.3)
Total Protein: 7.3 g/dL (ref 6.0–8.3)

## 2012-01-27 LAB — LIPID PANEL: Total CHOL/HDL Ratio: 5

## 2012-01-27 NOTE — Telephone Encounter (Signed)
We can try an abx - Cipro if she can take it x 4 d

## 2012-01-27 NOTE — Telephone Encounter (Signed)
Pt thinks she has UTI and had her UA done this am. She requests abx for this. She cant take zpak, PCN or Cipro. Please advise when labs are resulted.

## 2012-01-27 NOTE — Telephone Encounter (Signed)
Cassie [name], please, inform patient that all labs are normal except for low B12 UA was nl Thx

## 2012-01-27 NOTE — Telephone Encounter (Signed)
Pt informed. She states she is having burning during urination and pressure and mild pain in lower abd. What could this be? What should she do?

## 2012-01-28 ENCOUNTER — Encounter: Payer: Self-pay | Admitting: Internal Medicine

## 2012-01-28 ENCOUNTER — Ambulatory Visit (INDEPENDENT_AMBULATORY_CARE_PROVIDER_SITE_OTHER): Payer: 59 | Admitting: Internal Medicine

## 2012-01-28 VITALS — BP 124/80 | HR 57 | Temp 98.1°F | Wt 308.0 lb

## 2012-01-28 DIAGNOSIS — N760 Acute vaginitis: Secondary | ICD-10-CM

## 2012-01-28 DIAGNOSIS — K5190 Ulcerative colitis, unspecified, without complications: Secondary | ICD-10-CM

## 2012-01-28 DIAGNOSIS — N951 Menopausal and female climacteric states: Secondary | ICD-10-CM

## 2012-01-28 LAB — VITAMIN D 25 HYDROXY (VIT D DEFICIENCY, FRACTURES): Vit D, 25-Hydroxy: 10 ng/mL — ABNORMAL LOW (ref 30–89)

## 2012-01-28 NOTE — Patient Instructions (Signed)
Do not see any alarming findings on your exam today jsut some redness.  Go ahead and treat for yeast with otc 3 or 7 day miconazole or equivalent.  Fu with GYNE if  persistent or progressive

## 2012-01-28 NOTE — Progress Notes (Signed)
  Subjective:    Patient ID: Cassie [name], female    DOB: 07-18-1960, 51 y.o.   MRN: [id number]  HPI Patient comes in today for SDA for  new problem evaluation. Saturday clinic.  Has apparently negative UA done this week  With reg labs but not seen . But no one called her about fu so has appt in sat clinic.  No change in frequency  uti sx.  Insidious onset of sx   Burning all the time ext GU   And varies waxes and wanes.   Onset of burning for 2 days. No recent antibiotic  And no itching some soreness.   Hx of uc.  And ibs  No dc  Except clear   Has a gyne   Change from loestrin to femhrt.  Perimenopausal.  Review of Systems No fever flank pain bleeding as above no hx of hsv  No recent sa.  Past history family history social history reviewed in the electronic medical record.    Objective:   Physical Exam BP 124/80  Pulse 57  Temp 98.1 F (36.7 C) (Oral)  Wt 308 lb (139.708 kg)  SpO2 96% WDWN in nad Abdomen:  Sof,t normal bowel sounds without hepatosplenomegaly, no guarding rebound or masses no CVA tenderness Ext GU :  No lesion  Post perineum with  Punctate healing  Areas no vesicles or ulcers.  Mucosa red no dc no lesion or focal area of pain.  No ingu adenopathy.     Assessment & Plan:  Vaginitis sx   No obv cause on exam  Empiric rx for yeast at this point    Disc risk benefit and will take topical otc  Because of   Co morbid conditions  don't think the down shift in hormones causing ATROPHIC/ VAG See gyne  If  persistent or progressive

## 2012-01-30 NOTE — Telephone Encounter (Signed)
I called pt to inform of Rx for cipro. She states she hasn't had any urge to urinate and she is urinating less often even though she is drinking plenty. She wants to hold off on cipro and see if you think she needs a urology consult. If she needs a urologist she prefers a female. Please advise.

## 2012-01-30 NOTE — Telephone Encounter (Signed)
There is a female PA with Urology. We can arrange Thx

## 2012-01-31 ENCOUNTER — Telehealth: Payer: Self-pay | Admitting: Internal Medicine

## 2012-01-31 NOTE — Telephone Encounter (Signed)
Cassie [name], please, inform patient that her vit d is low. Take vit D Thx

## 2012-01-31 NOTE — Telephone Encounter (Signed)
Pt left vm requesting a different abx than cipro or zpak. She states she doesn't want to take either of those due to cardiac sxs or interactions. Please advise.

## 2012-02-01 MED ORDER — NITROFURANTOIN MONOHYD MACRO 100 MG PO CAPS: 100.0000 mg | ORAL_CAPSULE | Freq: Two times a day (BID) | ORAL | Status: DC

## 2012-02-01 NOTE — Telephone Encounter (Signed)
Ok Macrobid x 5 d Thx

## 2012-02-02 NOTE — Telephone Encounter (Signed)
Pt informed

## 2012-02-06 ENCOUNTER — Telehealth: Payer: Self-pay | Admitting: Internal Medicine

## 2012-02-06 NOTE — Telephone Encounter (Signed)
Spoke with patient and she reports she thought she had a UTI a couple of weeks ago but her labs at her PCP did not show this. She took antibiotics per PCP anyway and will complete them tomorrow. She is still having LLQ discomfort. States it is not like a flare but is just there all the time. Denies bleeding, mucous or change in stools. States her stools are thin which is her usual. Also, upper left rib cage area hurts after eating and sometimes her stomach burns. These symptoms are off and on. Patient has hx of ulcerative colitis, hiatal hernia. She states she is overdue for a colonoscopy. She is not currently taking anything for UC. Scheduled with Nicoletta Ba, PA on 02/07/12 at 8:30 AM(Dr. Olevia Perches, supervising)

## 2012-02-07 ENCOUNTER — Ambulatory Visit (INDEPENDENT_AMBULATORY_CARE_PROVIDER_SITE_OTHER): Payer: 59 | Admitting: Physician Assistant

## 2012-02-07 ENCOUNTER — Encounter: Payer: Self-pay | Admitting: Internal Medicine

## 2012-02-07 ENCOUNTER — Other Ambulatory Visit: Payer: Self-pay | Admitting: *Deleted

## 2012-02-07 ENCOUNTER — Ambulatory Visit: Payer: 59 | Admitting: Physician Assistant

## 2012-02-07 ENCOUNTER — Encounter: Payer: Self-pay | Admitting: Physician Assistant

## 2012-02-07 ENCOUNTER — Telehealth: Payer: Self-pay | Admitting: *Deleted

## 2012-02-07 VITALS — BP 130/70 | HR 62 | Ht 64.5 in | Wt 309.4 lb

## 2012-02-07 DIAGNOSIS — Z8719 Personal history of other diseases of the digestive system: Secondary | ICD-10-CM

## 2012-02-07 DIAGNOSIS — R1032 Left lower quadrant pain: Secondary | ICD-10-CM

## 2012-02-07 DIAGNOSIS — K5190 Ulcerative colitis, unspecified, without complications: Secondary | ICD-10-CM

## 2012-02-07 DIAGNOSIS — R1012 Left upper quadrant pain: Secondary | ICD-10-CM

## 2012-02-07 NOTE — Progress Notes (Signed)
Subjective:    Patient ID: Cassie [name], female    DOB: [birth year], 51 y.o.   MRN: [id number]  HPI Cassie [name] is a 51 year old female known remotely to Dr. Olevia Perches. She believes she was last seen in 2008 on the and was to schedule colonoscopy at that time but she did not follow through with that, apparently due to family issues. She does carry a diagnosis of ulcerative colitis and says that she was initially diagnosed at age 13. We have 1 colonoscopy report in her records from 2001 area This was done in [facility] and showed recurrent ulcerative colitis from the rectosigmoid up to 50 cm. Patient has not been on any medication for several years for her colitis. She says she usually manages her flares herself. She says typically when she gets a flare this will involve diarrheal stools with mucous in small amounts of blood which may last for a week or 2 and then resolves. She says she takes probiotics and uses Imodium when necessary and has done fine with this. She says she hasn't had a recent flare. Over the past 2-3 weeks she's been having difficulty with left-sided abdominal pain. She says her symptoms actually initially started with a urethral burning though no burning with urination. Soft she may have a urinary tract infection she had seen her primary care doctor and culture which was negative he was then seen by OB/GYN guarded on an estrogen cream. She has now been taking Macrobid over the past 5 days for possible UTI. She says that her symptoms are improved but not resolved. She says she's been having intermittent left lower quadrant pain over the past month which is occasionally sharp. She also mentions a left upper quadrant pain which she's had off and on over the past few years and says that this is sometimes worse postprandially she has related to a hiatal hernia. She denies any recent diarrhea melena or hematochezia has not had any changes in her bowel habits. No fever or chills. She has not had  any recent imaging. Most recent labs have been reviewed and were normal.  Patient mentions that she's very anxious about having a colonoscopy in the why she has not scheduled. She said she had one colonoscopy several years ago which was complicated by post procedure hypotension, apparently some bradycardia. She says that she is being treated by Dr. Johnsie Cancel  for palpitations, and is on Tenormin for this.    Review of Systems  Constitutional: Negative.   HENT: Negative.   Eyes: Negative.   Respiratory: Negative.   Cardiovascular: Positive for palpitations.  Gastrointestinal: Positive for abdominal pain.  Genitourinary: Positive for dysuria.  Musculoskeletal: Negative.   Skin: Negative.   Neurological: Negative.   Hematological: Negative.   Psychiatric/Behavioral: Negative.    Outpatient Prescriptions Prior to Visit  Medication Sig Dispense Refill  . ALPRAZolam (XANAX) 1 MG tablet Take 1.5 tablets (1.5 mg total) by mouth daily. Take 1/2 tab in Am, 1 tab at bedtime & as needed.  75 tablet  5  . ibuprofen (ADVIL,MOTRIN) 600 MG tablet TAKE 1 TABLET BY MOUTH TWICE A DAY AFTER MEALS AS NEEDED FOR PAIN  60 tablet  2  . NASCOBAL 500 MCG/0.1ML SOLN USE 1 SPRAY IN ONE NOSTRIL ONE TIME WEEKLY.  ALTERNATE NOSTRILS WEEKLYAS DIRECTED  3 mL  5  . nitrofurantoin, macrocrystal-monohydrate, (MACROBID) 100 MG capsule Take 1 capsule (100 mg total) by mouth 2 (two) times daily.  10 capsule  0  . norethindrone-ethinyl estradiol (  FEMHRT LOW DOSE) 0.5-2.5 MG-MCG per tablet Take 1 tablet by mouth daily.  28 tablet  0  . atenolol (TENORMIN) 50 MG tablet TAKE TWO TABLETS BY MOUTH EVERY MORNING AND 1 TABLET IN THE PM  90 tablet  1  . Cholecalciferol 2000 UNITS CAPS Take 1 capsule by mouth daily.        . nebivolol (BYSTOLIC) 10 MG tablet Take 1 tablet (10 mg total) by mouth daily.  30 tablet  11   Allergies  Allergen Reactions  . Diphenhydramine Hcl     REACTION: unspecified  . Mesalamine     REACTION:  unspecified  . Penicillins     REACTION: unspecified  . Prednisone     REACTION: unspecified  . Sulfasalazine     REACTION: unspecified   Patient Active Problem List  Diagnosis  . LIPOMA NEC  . VITAMIN B12 DEFICIENCY  . VITAMIN D DEFICIENCY  . OBESITY, MORBID  . ANXIETY  . PANIC DISORDER  . CARDIAC ARRHYTHMIA  . URI  . ULCERATIVE COLITIS  . CYSTITIS  . CELLULITIS AND ABSCESS OF NECK  . CERVICAL LYMPHADENITIS  . PLANTAR FASCITIS  . FATIGUE  . HYPERHIDROSIS  . EDEMA  . Palpitations  . LUQ PAIN  . TOBACCO USE, QUIT  . DIZZINESS  . Acute bronchitis  . Vertigo  . Allergic rhinitis  . Vaginitis and vulvovaginitis  . Perimenopausal   History   Social History  . Marital Status: Married    Spouse Name: N/A    Number of Children: 0  . Years of Education: N/A   Occupational History  . Housewife    Social History Main Topics  . Smoking status: Former Smoker    Types: Cigarettes  . Smokeless tobacco: Never Used  . Alcohol Use: No  . Drug Use: No  . Sexually Active: Yes   Other Topics Concern  . Not on file   Social History Narrative  . No narrative on file       Objective:   Physical Exam well-developed morbidly obese white female in no acute distress, pleasant blood pressure 130/70 pulse 62 height 5 foot 4 weight 309  Bmi=52 HEENT; nontraumatic normocephalic EOMI PERRLA sclera anicteric,Neck; Supple no JVD, Pulmonary; clear bilaterally, Abdomen; obese soft she is mildly tender in the left mid quadrant left lower quadrant no guarding no rebound no palpable mass or hepatosplenomegaly no real tenderness in the left upper quadrant. Bowel sounds are present, Rectal; exam not done, Extremities; no clubbing cyanosis or edema skin warm and dry, Psych ;mood and affect normal and appropriate.       Assessment & Plan:  #53  51 year old female with long history of ulcerative colitis which has been relatively quiescent, she has not been seen in our office in several years.  Patient now presents with several week history of left lower quadrant pain but no associated changes in bowel habits. Etiology of her pain is not clear.  #2  Colon neoplasia surveillance-last colonoscopy 2001, she is overdue for surveillance especially given long history of ulcerative colitis #3 left upper quadrant pain/chronic/intermittent-suspect this may be musculoskeletal #4 morbid obesity; BMI was 52 #5 history PVCs #6 history of panic disorder and personality disorder  Plan; schedule for CT scan of the abdomen and pelvis which will help evaluate both the left lower ordered and left upper quadrant pain. She could have diverticular disease that was not evident on remote colonoscopy also  cannot rule out occult colon lesion.  Schedule for colonoscopy  with Dr. Delfin Edis, this will need to be done at the hospital with propofol, patient very anxious about the procedure and sedation. We will also send a letter to Dr. Johnsie Cancel at patient's request given her history of PVCs.. procedure discussed in detail the patient is agreeable to proceed.

## 2012-02-07 NOTE — Progress Notes (Signed)
reviewed and agree. In the meantime ? Antispasmodics?

## 2012-02-07 NOTE — Telephone Encounter (Signed)
February 07, 2012  Patient:  Cassie [name] [place] [address]   Dear Dr Jenkins Rouge,  I saw this patient in our office today, 02-07-2012.  We want to order a Colonoscopy with Propofol sedation.  This patient has a history of PVC's.  She requested we send you a letter to advise you of the procedure and to be sure you agree the sedation is appropriate for her.    If you have any questions or concerns, please don't hesitate to call. Our office number is [phone].  Dr Delfin Edis will be performing the Colonoscopy .   Sincerely,    Nicoletta Ba, PA

## 2012-02-07 NOTE — Telephone Encounter (Signed)
Ok for propofol

## 2012-02-07 NOTE — Patient Instructions (Addendum)
We have scheduled the Ct scan for Friday 02-10-2012. We will call you about scheduling the Colonoscopy some time in November.  Amy Esterwood PA will talk to Dr. Olevia Perches about a date. We will in the meantime send a letter to Dr. Johnsie Cancel about the sedation,Propofol. He willa davise [place] if he has any concerns.   You have been scheduled for a CT scan of the abdomen and pelvis at [place] ([address]---this is in the same building as Press photographer).   You are scheduled on Friday 02-10-2012 at 9:30 AM  You should arrive at 9:15 AM. prior to your appointment time for registration. Please follow the written instructions below on the day of your exam:  WARNING: IF YOU ARE ALLERGIC TO IODINE/X-RAY DYE, PLEASE NOTIFY RADIOLOGY IMMEDIATELY AT [phone]! YOU WILL BE GIVEN A 13 HOUR PREMEDICATION PREP.  1) Do not eat or drink anything after 5:30 Am  (4 hours prior to your test) 2) You have been given 2 bottles of oral contrast to drink. The solution may taste               better if refrigerated, but do NOT add ice or any other liquid to this solution. Shake             well before drinking.    Drink 1 bottle of contrast @ 7:30 Am  (2 hours prior to your exam)  Drink 1 bottle of contrast @ 8:30 Am  (1 hour prior to your exam)  You may take any medications as prescribed with a small amount of water except for the following: Metformin, Glucophage, Glucovance, Avandamet, Riomet, Fortamet, Actoplus Met, Janumet, Glumetza or Metaglip. The above medications must be held the day of the exam AND 48 hours after the exam.  The purpose of you drinking the oral contrast is to aid in the visualization of your intestinal tract. The contrast solution may cause some diarrhea. Before your exam is started, you will be given a small amount of fluid to drink. Depending on your individual set of symptoms, you may also receive an intravenous injection of x-ray contrast/dye. Plan on being at [facility]  for 30 minutes or long, depending on the type of exam you are having performed.  If you have any questions regarding your exam or if you need to reschedule, you may call the CT department at [phone] between the hours of 8:00 am and 5:00 pm, Monday-Friday.  ________________________________________________________________________

## 2012-02-10 ENCOUNTER — Telehealth: Payer: Self-pay

## 2012-02-10 ENCOUNTER — Telehealth: Payer: Self-pay | Admitting: Internal Medicine

## 2012-02-10 ENCOUNTER — Ambulatory Visit (INDEPENDENT_AMBULATORY_CARE_PROVIDER_SITE_OTHER)
Admission: RE | Admit: 2012-02-10 | Discharge: 2012-02-10 | Disposition: A | Discharge status: Home / Self Care | Payer: 59 | Source: Ambulatory Visit | Attending: Physician Assistant | Admitting: Physician Assistant

## 2012-02-10 DIAGNOSIS — R1032 Left lower quadrant pain: Secondary | ICD-10-CM

## 2012-02-10 DIAGNOSIS — R1012 Left upper quadrant pain: Secondary | ICD-10-CM

## 2012-02-10 MED ORDER — IOHEXOL 300 MG/ML  SOLN
100.0000 mL | Freq: Once | INTRAMUSCULAR | Status: AC | PRN
  Administered 2012-02-10: 100 mL via INTRAVENOUS

## 2012-02-10 NOTE — Telephone Encounter (Signed)
I called the patient to advise that we did send her a letter with the appointment for the previsit on 03-06-2012 at 9 AM.  The colonoscopy is at [facility] on 03-22-2012 at 11:15 AM. She saw this in [facility] and wanted to be sure.

## 2012-02-10 NOTE — Telephone Encounter (Signed)
Pt advised.

## 2012-02-10 NOTE — Telephone Encounter (Signed)
Yes. Keep this appt pls Thx

## 2012-02-10 NOTE — Telephone Encounter (Signed)
Pt called requesting MD advise whether 6 mth follow scheduled in November is still necessary since she has already had all labs and referral appointments.

## 2012-02-12 ENCOUNTER — Encounter: Payer: Self-pay | Admitting: Physician Assistant

## 2012-02-13 ENCOUNTER — Telehealth: Payer: Self-pay | Admitting: Physician Assistant

## 2012-02-13 ENCOUNTER — Encounter: Payer: Self-pay | Admitting: Internal Medicine

## 2012-02-13 NOTE — Telephone Encounter (Signed)
Message -----   From: [name]  ----- Message ----- From: [name] Sent: 02/12/2012 2:59 PM To: [facility] Clinical Pool Subject: Visit Follow-Up Question Dr. Trellis Paganini, I got the results of my CT scan on Friday. I am very thankful that nothing showed up of concern. I do have a question for you please. The scan was of the abdomen and I thought pelvic area. Did this show only the GI tract or did it show internal organs also? I want to know if it scanned my pancreas, spleen, liver, kidneys, ovaries...etc. Would it have shown up if their was anything going on with any of these organs? I ask because the lower left abdomen is still having pain. And, the upper left abdomen (area under left breast in rib area) is having much more pain than before. I am so concerned as to what could be causing pain in these areas? Is it possible that an MRI or an ultrasound could possibly help to determine what this is? I am going out of state next week to visit relatives and I am very concerned about leaving here with pain like I am having. Can you please contact me as soon as possible and let me know what the next step I should take would be? Thank you in Advance, Cassie [name] [phone]  Sent: 02/12/2012 2:59 PM   To: [facility] Clinical Pool   Subject: Visit Follow-Up Question       Dr. Trellis Paganini, I got the results of my CT scan on Friday. I am very thankful that nothing showed up of concern. I do have a question for you please. The scan was of the abdomen and I thought pelvic area. Did this show only the GI tract or did it show internal organs also? I want to know if it scanned my pancreas, spleen, liver, kidneys, ovaries...etc. Would it have shown up if their was anything going on with any of these organs? I ask because the lower left abdomen is still having pain. And, the upper left abdomen (area under left breast in rib area) is having much more pain than before. I am so concerned as to what could be causing pain in these  areas? Is it possible that an MRI or an ultrasound could possibly help to determine what this is? I am going out of state next week to visit relatives and I am very concerned about leaving here with pain like I am having. Can you please contact me as soon as possible and let me know what the next step I should take would be?    Thank you in Advance,   Cassie [name]   [phone]      Please call pt . Yes ,the Ct scan did look at all of your abdominal organs including the pelvis. i do not know why she is hurting, but that is why she is  Also scheduled for a colonoscopy. We should wait for colonoscopy results before pursuing any other workup.

## 2012-02-13 NOTE — Telephone Encounter (Signed)
Spoke with patient and gave her Cassie [name], [place] recommendation and answers to questions.

## 2012-02-14 ENCOUNTER — Encounter: Payer: Self-pay | Admitting: Internal Medicine

## 2012-02-28 ENCOUNTER — Other Ambulatory Visit: Payer: Self-pay | Admitting: Internal Medicine

## 2012-02-29 ENCOUNTER — Other Ambulatory Visit: Payer: Self-pay | Admitting: General Practice

## 2012-02-29 ENCOUNTER — Encounter: Payer: Self-pay | Admitting: Internal Medicine

## 2012-02-29 MED ORDER — ATENOLOL 50 MG PO TABS: 50.0000 mg | ORAL_TABLET | Freq: Every day | ORAL | Status: DC

## 2012-03-01 ENCOUNTER — Other Ambulatory Visit: Payer: Self-pay | Admitting: *Deleted

## 2012-03-01 ENCOUNTER — Telehealth: Payer: Self-pay | Admitting: *Deleted

## 2012-03-01 MED ORDER — ALPRAZOLAM 1 MG PO TABS: 1.5000 mg | ORAL_TABLET | Freq: Every day | ORAL | Status: DC

## 2012-03-01 MED ORDER — ALPRAZOLAM 1 MG PO TABS: ORAL_TABLET | ORAL | Status: DC

## 2012-03-01 NOTE — Telephone Encounter (Signed)
Done

## 2012-03-01 NOTE — Telephone Encounter (Signed)
Rf req for Alprazolam 1 mg. Last filled 01/26/12. Ok to Rf?

## 2012-03-01 NOTE — Telephone Encounter (Signed)
OK to fill this prescription with additional refills x3 Thank you!

## 2012-03-05 ENCOUNTER — Encounter: Payer: Self-pay | Admitting: Internal Medicine

## 2012-03-06 ENCOUNTER — Ambulatory Visit (AMBULATORY_SURGERY_CENTER): Payer: 59

## 2012-03-06 ENCOUNTER — Telehealth: Payer: Self-pay | Admitting: *Deleted

## 2012-03-06 VITALS — Ht 64.5 in | Wt 310.2 lb

## 2012-03-06 DIAGNOSIS — R1032 Left lower quadrant pain: Secondary | ICD-10-CM

## 2012-03-06 DIAGNOSIS — Z1211 Encounter for screening for malignant neoplasm of colon: Secondary | ICD-10-CM

## 2012-03-06 DIAGNOSIS — Z8719 Personal history of other diseases of the digestive system: Secondary | ICD-10-CM

## 2012-03-06 MED ORDER — MOVIPREP 100 G PO SOLR: ORAL | Status: DC

## 2012-03-06 NOTE — Telephone Encounter (Signed)
Spoke with patient and gave her Nicoletta Ba, [place] recommendations.

## 2012-03-06 NOTE — Telephone Encounter (Signed)
I would ask her to discuss with Dr. Alain Marion her primary- it was too small too characterize ,and radiologist did not recommend follow up but would be reasonable to re-image in 6 months

## 2012-03-06 NOTE — Telephone Encounter (Signed)
Per Pre visit  Nurse, patient has a concern about CT scan done. Spoke with patient and she received her report via my chart. She is wondering if the small right renal hypodensity on CT should be followed up on in the next year to make sure it has not "grown". Please, advise.

## 2012-03-07 ENCOUNTER — Encounter: Payer: Self-pay | Admitting: Internal Medicine

## 2012-03-16 ENCOUNTER — Ambulatory Visit (INDEPENDENT_AMBULATORY_CARE_PROVIDER_SITE_OTHER): Payer: 59 | Admitting: Internal Medicine

## 2012-03-16 ENCOUNTER — Encounter: Payer: Self-pay | Admitting: Internal Medicine

## 2012-03-16 VITALS — BP 130/86 | HR 76 | Temp 98.8°F | Resp 16 | Wt 311.0 lb

## 2012-03-16 DIAGNOSIS — J069 Acute upper respiratory infection, unspecified: Secondary | ICD-10-CM

## 2012-03-16 DIAGNOSIS — E538 Deficiency of other specified B group vitamins: Secondary | ICD-10-CM

## 2012-03-16 DIAGNOSIS — E559 Vitamin D deficiency, unspecified: Secondary | ICD-10-CM

## 2012-03-16 NOTE — Assessment & Plan Note (Signed)
Wt Readings from Last 3 Encounters:  03/16/12 311 lb (141.069 kg)  03/06/12 310 lb 3.2 oz (140.706 kg)  02/07/12 309 lb 6.4 oz (140.343 kg)

## 2012-03-16 NOTE — Assessment & Plan Note (Signed)
Recheck B12 Continue with current prescription therapy as reflected on the Med list.

## 2012-03-16 NOTE — Patient Instructions (Signed)
Mucinex Delsym

## 2012-03-16 NOTE — Assessment & Plan Note (Signed)
Continue with current prescription therapy as reflected on the Med list. Recheck labs

## 2012-03-18 NOTE — Progress Notes (Signed)
   Subjective:    Patient ID: Cassie [name], female    DOB: Apr 24, 1961, 51 y.o.   MRN: [id number]  HPI The patient presents for a follow-up of chronic palpitations - better, anxiety, B12 and vit D def controlled with medicines F/u vertigo and lightheadedness - she saw Dr Constance Holster ENT and Dr Baltazar Najjar Neurol for vertigo  Wt Readings from Last 3 Encounters:  03/16/12 311 lb (141.069 kg)  03/06/12 310 lb 3.2 oz (140.706 kg)  02/07/12 309 lb 6.4 oz (140.343 kg)   BP Readings from Last 3 Encounters:  03/16/12 130/86  02/07/12 130/70  01/28/12 124/80      Review of Systems  Constitutional: Negative for chills, activity change, appetite change, fatigue and unexpected weight change.  HENT: Negative for congestion, mouth sores and sinus pressure.   Eyes: Negative for visual disturbance.  Respiratory: Negative for cough and chest tightness.   Cardiovascular: Positive for palpitations.  Gastrointestinal: Negative for nausea and abdominal pain.  Genitourinary: Negative for frequency, difficulty urinating and vaginal pain.  Musculoskeletal: Negative for back pain and gait problem.  Skin: Negative for pallor and rash.  Neurological: Positive for dizziness and light-headedness. Negative for tremors, weakness, numbness and headaches.  Psychiatric/Behavioral: Negative for confusion and sleep disturbance.       Objective:   Physical Exam  Constitutional: She appears well-developed. No distress.       Obese  HENT:  Head: Normocephalic.  Right Ear: External ear normal.  Left Ear: External ear normal.  Nose: Nose normal.  Mouth/Throat: Oropharynx is clear and moist.  Eyes: Conjunctivae normal are normal. Pupils are equal, round, and reactive to light. Right eye exhibits no discharge. Left eye exhibits no discharge.  Neck: Normal range of motion. Neck supple. No JVD present. No tracheal deviation present. No thyromegaly present.  Cardiovascular: Normal rate, regular rhythm and normal heart  sounds.   Pulmonary/Chest: No stridor. No respiratory distress. She has no wheezes.  Abdominal: Soft. Bowel sounds are normal. She exhibits no distension and no mass. There is no tenderness. There is no rebound and no guarding.  Musculoskeletal: She exhibits no edema and no tenderness.  Lymphadenopathy:    She has no cervical adenopathy.  Neurological: She displays normal reflexes. No cranial nerve deficit. She exhibits normal muscle tone. Coordination normal.  Skin: No rash noted. No erythema.  Psychiatric: She has a normal mood and affect. Her behavior is normal. Judgment and thought content normal.    Lab Results  Component Value Date   WBC 8.9 01/27/2012   HGB 13.1 01/27/2012   HCT 40.2 01/27/2012   PLT 265.0 01/27/2012   GLUCOSE 104* 01/27/2012   CHOL 177 01/27/2012   TRIG 83.0 01/27/2012   HDL 34.10* 01/27/2012   LDLCALC 126* 01/27/2012   ALT 19 01/27/2012   AST 18 01/27/2012   NA 142 01/27/2012   K 4.6 01/27/2012   CL 109 01/27/2012   CREATININE 0.9 01/27/2012   BUN 11 01/27/2012   CO2 25 01/27/2012   TSH 3.44 01/27/2012   HGBA1C 6.0 09/14/2006         Assessment & Plan:

## 2012-03-21 ENCOUNTER — Encounter (HOSPITAL_COMMUNITY): Payer: Self-pay | Admitting: Pharmacy Technician

## 2012-03-21 ENCOUNTER — Telehealth: Payer: Self-pay | Admitting: Internal Medicine

## 2012-03-21 NOTE — Telephone Encounter (Signed)
Reviewed the history of anxiety , already canceled one colonoscopy in 2008. She will need to see me in the office before scheduling a colonoscopy.

## 2012-03-21 NOTE — Telephone Encounter (Signed)
Patient calling to cancel her colonoscopy on 03/26/12. States she is having heart palpations this week and does not want to have the procedure when she is having heart problems. She will call back when she is ready to reschedule. Cancelled at [place] with [facility]

## 2012-03-22 ENCOUNTER — Encounter: Payer: Self-pay | Admitting: Internal Medicine

## 2012-03-22 SURGERY — Surgical Case
Anesthesia: *Unknown

## 2012-03-26 ENCOUNTER — Ambulatory Visit (HOSPITAL_COMMUNITY): Admission: RE | Admit: 2012-03-26 | Payer: 59 | Source: Ambulatory Visit | Admitting: Internal Medicine

## 2012-03-26 ENCOUNTER — Encounter (HOSPITAL_COMMUNITY): Admission: RE | Payer: Self-pay | Source: Ambulatory Visit

## 2012-03-26 SURGERY — COLONOSCOPY
Anesthesia: Monitor Anesthesia Care

## 2012-03-28 ENCOUNTER — Other Ambulatory Visit: Payer: Self-pay | Admitting: Internal Medicine

## 2012-03-29 MED ORDER — ATENOLOL 50 MG PO TABS: ORAL_TABLET | ORAL | Status: DC

## 2012-04-04 ENCOUNTER — Encounter: Payer: Self-pay | Admitting: Internal Medicine

## 2012-04-10 ENCOUNTER — Other Ambulatory Visit: Payer: Self-pay | Admitting: Internal Medicine

## 2012-04-10 DIAGNOSIS — Z0000 Encounter for general adult medical examination without abnormal findings: Secondary | ICD-10-CM

## 2012-04-12 ENCOUNTER — Encounter: Payer: Self-pay | Admitting: Internal Medicine

## 2012-04-16 ENCOUNTER — Encounter: Payer: Self-pay | Admitting: Internal Medicine

## 2012-05-25 ENCOUNTER — Other Ambulatory Visit: Payer: Self-pay | Admitting: Internal Medicine

## 2012-05-28 ENCOUNTER — Other Ambulatory Visit (INDEPENDENT_AMBULATORY_CARE_PROVIDER_SITE_OTHER): Payer: 59

## 2012-05-28 ENCOUNTER — Ambulatory Visit (INDEPENDENT_AMBULATORY_CARE_PROVIDER_SITE_OTHER): Payer: 59 | Admitting: Internal Medicine

## 2012-05-28 ENCOUNTER — Other Ambulatory Visit: Payer: Self-pay | Admitting: Internal Medicine

## 2012-05-28 ENCOUNTER — Telehealth: Payer: Self-pay | Admitting: Internal Medicine

## 2012-05-28 ENCOUNTER — Encounter: Payer: Self-pay | Admitting: Internal Medicine

## 2012-05-28 VITALS — BP 120/78 | HR 80 | Temp 99.0°F | Resp 16 | Wt 308.0 lb

## 2012-05-28 DIAGNOSIS — R002 Palpitations: Secondary | ICD-10-CM

## 2012-05-28 DIAGNOSIS — L049 Acute lymphadenitis, unspecified: Secondary | ICD-10-CM

## 2012-05-28 DIAGNOSIS — M940 Chondrocostal junction syndrome [Tietze]: Secondary | ICD-10-CM | POA: Insufficient documentation

## 2012-05-28 DIAGNOSIS — Z0000 Encounter for general adult medical examination without abnormal findings: Secondary | ICD-10-CM

## 2012-05-28 DIAGNOSIS — E559 Vitamin D deficiency, unspecified: Secondary | ICD-10-CM

## 2012-05-28 DIAGNOSIS — J069 Acute upper respiratory infection, unspecified: Secondary | ICD-10-CM

## 2012-05-28 DIAGNOSIS — E538 Deficiency of other specified B group vitamins: Secondary | ICD-10-CM

## 2012-05-28 LAB — COMPREHENSIVE METABOLIC PANEL
ALT: 19 U/L (ref 0–35)
Albumin: 3.3 g/dL — ABNORMAL LOW (ref 3.5–5.2)
CO2: 27 mEq/L (ref 19–32)
Chloride: 105 mEq/L (ref 96–112)
GFR: 73.31 mL/min (ref >60.00)
Potassium: 4.5 mEq/L (ref 3.5–5.1)
Sodium: 139 mEq/L (ref 135–145)
Total Bilirubin: 0.5 mg/dL (ref 0.3–1.2)
Total Protein: 7.5 g/dL (ref 6.0–8.3)

## 2012-05-28 LAB — CBC WITH DIFFERENTIAL/PLATELET
Basophils Absolute: 0.1 10*3/uL (ref 0.0–0.1)
Eosinophils Relative: 4.1 % (ref 0.0–5.0)
Lymphocytes Relative: 19.2 % (ref 12.0–46.0)
Lymphs Abs: 1.9 10*3/uL (ref 0.7–4.0)
Monocytes Relative: 5.3 % (ref 3.0–12.0)
Neutrophils Relative %: 70.8 % (ref 43.0–77.0)
Platelets: 286 10*3/uL (ref 150.0–400.0)
RDW: 14.1 % (ref 11.5–14.6)
WBC: 9.6 10*3/uL (ref 4.5–10.5)

## 2012-05-28 LAB — BASIC METABOLIC PANEL
BUN: 9 mg/dL (ref 6–23)
Calcium: 9 mg/dL (ref 8.4–10.5)
Creatinine, Ser: 0.9 mg/dL (ref 0.4–1.2)
GFR: 73.31 mL/min (ref >60.00)
Glucose, Bld: 102 mg/dL — ABNORMAL HIGH (ref 70–99)

## 2012-05-28 LAB — URINALYSIS, ROUTINE W REFLEX MICROSCOPIC
Bilirubin Urine: NEGATIVE
Leukocytes, UA: NEGATIVE
Nitrite: NEGATIVE
pH: 6 (ref 5.0–8.0)

## 2012-05-28 LAB — LIPID PANEL: VLDL: 18.8 mg/dL (ref 0.0–40.0)

## 2012-05-28 LAB — TSH: TSH: 1.78 u[IU]/mL (ref 0.35–5.50)

## 2012-05-28 NOTE — Assessment & Plan Note (Signed)
LUQ

## 2012-05-28 NOTE — Progress Notes (Signed)
   Subjective:    Patient ID: Cassie [name], female    DOB: 1961-04-06, 52 y.o.   MRN: [id number]  HPI  C/o suprapubic and LLQ discomfort for a few days The patient presents for a follow-up of chronic palpitations - better, anxiety, B12 and vit D def controlled with medicines                      Review of Systems  Constitutional: Negative for chills, activity change, appetite change, fatigue and unexpected weight change.  HENT: Negative for congestion, mouth sores and sinus pressure.   Eyes: Negative for visual disturbance.  Respiratory: Negative for cough and chest tightness.   Cardiovascular: Positive for palpitations.  Gastrointestinal: Negative for nausea and abdominal pain.  Genitourinary: Negative for frequency, difficulty urinating and vaginal pain.  Musculoskeletal: Negative for back pain and gait problem.  Skin: Negative for pallor and rash.  Neurological: Positive for dizziness and light-headedness. Negative for tremors, weakness, numbness and headaches.  Psychiatric/Behavioral: Negative for confusion and sleep disturbance.  Fatty tumor LUQ - tender     Objective:   Physical Exam  Constitutional: She appears well-developed. No distress.       Obese  HENT:  Head: Normocephalic.  Right Ear: External ear normal.  Left Ear: External ear normal.  Nose: Nose normal.  Mouth/Throat: Oropharynx is clear and moist.  Eyes: Conjunctivae normal are normal. Pupils are equal, round, and reactive to light. Right eye exhibits no discharge. Left eye exhibits no discharge.  Neck: Normal range of motion. Neck supple. No JVD present. No tracheal deviation present. No thyromegaly present.  Cardiovascular: Normal rate, regular rhythm and normal heart sounds.   Pulmonary/Chest: No stridor. No respiratory distress. She has no wheezes.  Abdominal: Soft. Bowel sounds are normal. She exhibits no distension and no mass. There is no tenderness. There is no rebound and no guarding.    Musculoskeletal: She exhibits no edema and no tenderness.  Lymphadenopathy:    She has no cervical adenopathy.  Neurological: She displays normal reflexes. No cranial nerve deficit. She exhibits normal muscle tone. Coordination normal.  Skin: No rash noted. No erythema.  Psychiatric: She has a normal mood and affect. Her behavior is normal. Judgment and thought content normal.  Fatty tumor LUQ - tender L costochondr - tender  Lab Results  Component Value Date   WBC 8.9 01/27/2012   HGB 13.1 01/27/2012   HCT 40.2 01/27/2012   PLT 265.0 01/27/2012   GLUCOSE 104* 01/27/2012   CHOL 177 01/27/2012   TRIG 83.0 01/27/2012   HDL 34.10* 01/27/2012   LDLCALC 126* 01/27/2012   ALT 19 01/27/2012   AST 18 01/27/2012   NA 142 01/27/2012   K 4.6 01/27/2012   CL 109 01/27/2012   CREATININE 0.9 01/27/2012   BUN 11 01/27/2012   CO2 25 01/27/2012   TSH 3.44 01/27/2012   HGBA1C 6.0 09/14/2006         Assessment & Plan:

## 2012-05-28 NOTE — Telephone Encounter (Signed)
Ok Prom-cod if she can take it Thx

## 2012-05-28 NOTE — Telephone Encounter (Signed)
[facility] Triage Call Report Triage Record Num: [id number] Operator: Jani Files Patient Name: Cassie [name] Call Date & Time: 05/26/2012 9:13:41PM Patient Phone: [phone] PCP: Walker Kehr Patient Gender: Female PCP Fax : [phone] Patient DOB: Nov 04, 1960 [facility] Name[facility] Reason for Call: [name]; PCP: Walker Kehr (Adults only); CB#: [phone]; Patient states she has noted a change in her urinary output, onset 01/2012. Patient states she has noted decreased urinary output intermittently since 01/2012. Patient states she often does not even feel any urge to urinate. States, at times, she only urinates approx. 3X/day. Patient states she developed urinary pressure, lower back discomfort. Onset 05/25/12. Afebrile. Denies hematuria. States is voiding small amounts 05/26/12. Patient states she has increased her intake of fluids but her urinary output has not increased. Denies swelling of extremities or around eyes. Triage per Urinary Symptoms-Female Protocol. Disposition of " See Provider within 4 hours " obtained related to positive triage assessment for " Urinary Tract Symptoms and any flank or low back pain." Care advice given per guidelines. Patient advised increased fluids. Patient declines to be evaluated within 4 hours. Possible complications of delaying care explained to patient. Patient verbalizes understanding. Patient states she will follow up with office 05/28/12. Patient states she will go to ED if sx increase. Call back parameters reviewed. Patient verbalizes understanding. Protocol(s) Used: Urinary Symptoms - Female Recommended Outcome per Protocol: See Provider within 4 hours Reason for Outcome: Urinary tract symptoms AND any flank or low back pain Care Advice: ~ Another adult should drive. Increase intake of fluids. Try to drink 8 oz. (.2 liter) every hour when awake, including unsweetened cranberry juice, unless on restricted  fluids for other medical reasons. Take sips of fluid or eat ice chips if nauseated or vomiting. ~ SYMPTOM / CONDITION MANAGEMENT Limit carbonated, alcoholic, and caffeinated beverages such as coffee, tea and soda. Avoid nonprescription cold and allergy medications that contain caffeine. Limit intake of tomatoes, fruit juices (except for unsweetened cranberry juice), dairy products, spicy foods, sugar, and artificial sweeteners (aspartame or saccharine). Stop or decrease smoking. Reducing exposure to bladder irritants may help lessen urgency. Systemic Inflammatory Response Syndrome (SIRS): Watch for signs of a generalized, whole body infection. Occurs within days of a localized infection, especially of the urinary, GI, respiratory or nervous systems; or after a traumatic injury or invasive procedure. - Call EMS 911 if symptoms have worsened, such as increasing confusion or unusual drowsiness; cold and clammy skin; no urine output; rapid respiration (>30/min.) or slow respiration (<10/min.); struggling to breathe. - Go to the ED immediately for early symptoms of rapid pulse >90/min. or rapid breathing >20/min. at rest; chills; oral temperature >100.4 F (38 C) or <96.8 F (36 C) when associated with conditions noted.

## 2012-05-28 NOTE — Assessment & Plan Note (Signed)
R posterior 2013 FNA advised Labs pending

## 2012-05-28 NOTE — Telephone Encounter (Signed)
The patient is hoping to get a refill of cough medicine. Thanks!

## 2012-05-29 ENCOUNTER — Encounter: Payer: Self-pay | Admitting: Internal Medicine

## 2012-05-29 LAB — VITAMIN D 25 HYDROXY (VIT D DEFICIENCY, FRACTURES): Vit D, 25-Hydroxy: 35 ng/mL (ref 30–89)

## 2012-05-29 MED ORDER — PROMETHAZINE-CODEINE 6.25-10 MG/5ML PO SYRP: 5.0000 mL | ORAL_SOLUTION | ORAL | Status: AC | PRN

## 2012-05-29 NOTE — Telephone Encounter (Signed)
Done. Pt informed. She states she did not request Rx  For cough med. I will call [facility] and cancel Rx.

## 2012-05-29 NOTE — Assessment & Plan Note (Signed)
Cont tenormin

## 2012-05-29 NOTE — Assessment & Plan Note (Signed)
Wt Readings from Last 3 Encounters:  05/28/12 308 lb (139.708 kg)  03/16/12 311 lb (141.069 kg)  03/06/12 310 lb 3.2 oz (140.706 kg)

## 2012-06-05 ENCOUNTER — Telehealth: Payer: Self-pay | Admitting: *Deleted

## 2012-06-05 DIAGNOSIS — Z0000 Encounter for general adult medical examination without abnormal findings: Secondary | ICD-10-CM

## 2012-06-05 NOTE — Telephone Encounter (Signed)
Message copied by Cresenciano Lick on Tue Jun 05, 2012  8:27 AM ------      Message from: Sherral Hammers      Created: Mon May 28, 2012  4:55 PM      Regarding: labs       Physical labs for July

## 2012-06-05 NOTE — Telephone Encounter (Signed)
CPE labs entered.

## 2012-06-11 ENCOUNTER — Other Ambulatory Visit: Payer: Self-pay | Admitting: Internal Medicine

## 2012-06-11 ENCOUNTER — Telehealth: Payer: Self-pay | Admitting: Internal Medicine

## 2012-06-11 DIAGNOSIS — R0789 Other chest pain: Secondary | ICD-10-CM

## 2012-06-11 NOTE — Telephone Encounter (Signed)
Arietta in [place]: Thank you for your response. I would like the CT scan and the aspiration of the lump on the neck. Please let me call your office or email you here to set it up for me. I have a GI appt. on Wednesday the 5th of February and I know we will be scheduling a colonoscopy. I want to be sure of the date for it before I set up the CT scan and neck aspiration with you. I will call and contact as soon as I know the scheduled date. I noticed on my CBC that you didn't check the Sed Rate this time...that number had been so high in the past. Would you please order it and the other test that goes with it...ititials starting with a "C", I can't remember it, but you said the 2 kind of go together. Would you order them please on my next lab work? I will be contacting you. Thank You Virl Cagey, please, schedule US guided aspiration w/me - 30 min procedure room. CT chest ordered  Thx

## 2012-06-12 NOTE — Telephone Encounter (Signed)
[place] scheduler to contact pt and offer first available 30 min OV.

## 2012-06-13 ENCOUNTER — Ambulatory Visit: Payer: 59 | Admitting: Internal Medicine

## 2012-06-18 ENCOUNTER — Other Ambulatory Visit: Payer: Self-pay | Admitting: Internal Medicine

## 2012-06-19 ENCOUNTER — Telehealth: Payer: Self-pay | Admitting: *Deleted

## 2012-06-19 NOTE — Telephone Encounter (Signed)
OK to fill this prescription with additional refills x3 Thank you!

## 2012-06-19 NOTE — Telephone Encounter (Signed)
Rf req for Alprazolam 1 mg. Last filled 01/17/214. Ok to Rf?

## 2012-06-20 ENCOUNTER — Telehealth: Payer: Self-pay | Admitting: *Deleted

## 2012-06-20 MED ORDER — ALPRAZOLAM 1 MG PO TABS: ORAL_TABLET | ORAL | Status: DC

## 2012-06-20 NOTE — Telephone Encounter (Signed)
rx called in

## 2012-06-20 NOTE — Telephone Encounter (Signed)
Refill called in to [facility]

## 2012-06-20 NOTE — Telephone Encounter (Signed)
Ok to fill with x 5 ref Thx

## 2012-06-20 NOTE — Telephone Encounter (Signed)
Rf req for Alprazolam 1 mg 1/2 in am, 1/2 at lunch and 1/2 in pm, 1 qhs. # 75. Last filled 05/25/2012. Ok to Rf?

## 2012-07-25 ENCOUNTER — Encounter: Payer: Self-pay | Admitting: Internal Medicine

## 2012-07-25 ENCOUNTER — Ambulatory Visit (INDEPENDENT_AMBULATORY_CARE_PROVIDER_SITE_OTHER): Payer: 59 | Admitting: Internal Medicine

## 2012-07-25 VITALS — BP 110/72 | HR 76 | Ht 64.5 in | Wt 317.0 lb

## 2012-07-25 DIAGNOSIS — R9389 Abnormal findings on diagnostic imaging of other specified body structures: Secondary | ICD-10-CM

## 2012-07-25 DIAGNOSIS — K5190 Ulcerative colitis, unspecified, without complications: Secondary | ICD-10-CM

## 2012-07-25 NOTE — Patient Instructions (Addendum)
You have been scheduled for a colonoscopy with propofol. Please follow written instructions given to you at your visit today.  If you use inhalers (even only as needed), please bring them with you on the day of your procedure.  You have been scheduled for a kidney ultrasound at [facility] (1st floor of the hospital) on Thursday, 07/26/12. Your appointment is scheduled for 8:00 am. Please arrive at 7:45 am for registration. If you need to reschedule for any reason, please call radiology scheduling at [phone].  CC: Dr Alain Marion

## 2012-07-25 NOTE — Progress Notes (Signed)
Cassie [name] [birth year] MRN [id number]  History of Present Illness:  This is a 52 year old white female who is here to discuss having a screening colonoscopy. She has a history of left-sided ulcerative colitis since the age of 1. The initial diagnosis was made in [place]. Her last colonoscopy in 2001 showed active colitis from 0-50 cm. She was scheduled for a repeat colonoscopy in 2008 but canceled it. She was also seen in October 2013 for left lower quadrant abdominal pain. A CT scan of the abdomen showed no active problem other than small density in the right kidney which was too small to characterize. An upper abdominal ultrasound in April 2010 showed fatty liver. She is morbidly obese. She has panic disorder, anxiety and PVCs. Her bowel habits are currently regular, having one bowel movement a day. She denies rectal bleeding. She has occasional left lower quadrant abdominal discomfort. She also has left upper quadrant abdominal discomfort. She is on no medications for ulcerative colitis. She reports an allergy to sulfa.   Past Medical History  Diagnosis Date  . Anxiety   . Panic disorder   . Cardiac arrhythmia   . Other allergy, other than to medicinal agents   . Ulcerative colitis, unspecified   . Morbid obesity   . Vitamin B12 deficiency   . Vitamin D deficiency    Past Surgical History  Procedure Laterality Date  . Breast biopsy  11/2011    Left- benign  . Colonoscopy    . Sigmoidoscopy      reports that she quit smoking about 33 years ago. Her smoking use included Cigarettes. She smoked 0.00 packs per day. She has never used smokeless tobacco. She reports that she does not drink alcohol or use illicit drugs. family history includes Atrial fibrillation in her father and Heart disease (age of onset: 31) in her father.  There is no history of Colon cancer. Allergies  Allergen Reactions  . Diphenhydramine Hcl     REACTION: unspecified  . Mesalamine     REACTION:  unspecified. Does not remember taking.  Marland Kitchen Penicillins     REACTION: unspecified  . Prednisone     REACTION: unspecified  . Sudafed (Pseudoephedrine Hcl)   . Sulfasalazine     REACTION: unspecified  . Caffeine Palpitations        Review of Systems:  The remainder of the 10 point ROS is negative except as outlined in H&P   Physical Exam: General appearance  Well developed, in no distress. Morbidly obese Eyes- non icteric. HEENT nontraumatic, normocephalic. Mouth no lesions, tongue papillated, no cheilosis. Neck supple without adenopathy, thyroid not enlarged, no carotid bruits, no JVD. Lungs Clear to auscultation bilaterally. Cor normal S1, normal S2, regular rhythm, no murmur,  quiet precordium. Abdomen: Large abdomen with normal active bowel sounds. Soft. Minimal tenderness left costal margin at the  costochondral junctions, no distention. No fluid. Rectal: Deferred until colonoscopy. Extremities no pedal edema. Skin no lesions. Neurological alert and oriented x 3. Psychological normal mood and affect.  Assessment and Plan:  Problem #43 52 year old white female with left-sided ulcerative colitis who is due for a colonoscopy but has been postponing the exam because of anxiety. We had a long discussion about the sedation, prep and the procedure as well as the risk of colon cancer. She is determined to keep her appointment this time. We will hold her beta blocker on the date of procedure. She will be done in the hospital as an outpatient using propofol.  Problem #  2 Small right kidney lesion on CT scan of the abdomen. We will obtain a 6 month followup ultrasound of the right kidney.  Problem #2 History of hiatal hernia and gastroesophageal reflux. Patient is currently asymptomatic.   07/25/2012 Delfin Edis

## 2012-07-26 ENCOUNTER — Ambulatory Visit (HOSPITAL_COMMUNITY): Payer: 59

## 2012-07-27 ENCOUNTER — Ambulatory Visit (HOSPITAL_COMMUNITY): Admission: RE | Admit: 2012-07-27 | Payer: 59 | Source: Ambulatory Visit

## 2012-07-30 ENCOUNTER — Ambulatory Visit (HOSPITAL_COMMUNITY)
Admission: RE | Admit: 2012-07-30 | Discharge: 2012-07-30 | Disposition: A | Discharge status: Home / Self Care | Payer: 59 | Source: Ambulatory Visit | Attending: Internal Medicine | Admitting: Internal Medicine

## 2012-07-30 DIAGNOSIS — R93429 Abnormal radiologic findings on diagnostic imaging of unspecified kidney: Secondary | ICD-10-CM

## 2012-07-30 DIAGNOSIS — N289 Disorder of kidney and ureter, unspecified: Secondary | ICD-10-CM | POA: Insufficient documentation

## 2012-08-01 ENCOUNTER — Encounter: Payer: Self-pay | Admitting: Internal Medicine

## 2012-08-09 ENCOUNTER — Encounter: Payer: Self-pay | Admitting: Internal Medicine

## 2012-08-23 ENCOUNTER — Encounter (HOSPITAL_COMMUNITY): Payer: Self-pay | Admitting: *Deleted

## 2012-08-27 ENCOUNTER — Encounter (HOSPITAL_COMMUNITY): Payer: Self-pay | Admitting: *Deleted

## 2012-09-02 ENCOUNTER — Encounter: Payer: Self-pay | Admitting: Internal Medicine

## 2012-09-03 ENCOUNTER — Telehealth: Payer: Self-pay | Admitting: Cardiovascular Disease

## 2012-09-03 ENCOUNTER — Encounter: Payer: Self-pay | Admitting: Internal Medicine

## 2012-09-03 MED ORDER — BENZONATATE 200 MG PO CAPS: 200.0000 mg | ORAL_CAPSULE | Freq: Three times a day (TID) | ORAL | Status: DC | PRN

## 2012-09-03 NOTE — Telephone Encounter (Signed)
PT COMPLAINING WITH COUGH SINCE  EASTER Sunday   ALSO  NOTES  IN AM IS PRODUCTIVE WITH  YELLOW   SPUTUM   NOT SURE WHAT TO   TAKE  HAS TRIED  PLAIN ROBITUSSIN WITH NO RELIEF PT WANTING TO KNOW  YOUR RECOMMENDATIONS  IF MAY TAKE ALLERGY MED,  OTHER TYPE OF COUGH MED ,OR IF YOU COULD CALL IN  ANTIBIOTIC  PT AWARE WILL FORWARD TO DR Johnsie Cancel FOR REVIEW

## 2012-09-03 NOTE — Telephone Encounter (Signed)
New Problem:    Patient called in because she has had a persistent cough for the last 9 days and would like to see if Dr. Johnsie Cancel would be able to call in something for her.  Patient called her PCP and cannot get a response form their office.  Please call back.  If Dr. Johnsie Cancel is going to call her in a prescription for an antibiotics she request that he look at her chart because she has some allergies and also does not want a Z-pak.

## 2012-09-03 NOTE — Telephone Encounter (Signed)
claritin or allegra ok for allegies Needs to see urgent care or primary for this not me

## 2012-09-04 ENCOUNTER — Encounter (HOSPITAL_COMMUNITY): Payer: Self-pay | Admitting: Pharmacy Technician

## 2012-09-05 ENCOUNTER — Telehealth: Payer: Self-pay | Admitting: Internal Medicine

## 2012-09-05 NOTE — Telephone Encounter (Signed)
Spoke with patient and she states she has been coughing since Easter. Feels it is worse now. States she cannot take a lot of medications due to heart problems. She is worried about having the colonoscopy next week. Told patient she needs to see PCP and then let us know if she needs to reschedule. She will do this.

## 2012-09-06 NOTE — Telephone Encounter (Signed)
LMTCB ./CY 

## 2012-09-06 NOTE — Telephone Encounter (Signed)
PT AWARE  .Cassie [name]

## 2012-09-07 ENCOUNTER — Telehealth: Payer: Self-pay | Admitting: Internal Medicine

## 2012-09-07 NOTE — Telephone Encounter (Signed)
Cough x 2 weeks, kept the COLON appt. This am didn't sleep, but she has agreed to keep her appt. States she has PVCs and PACs and coughing has made the HR worse. She will see how she is on Monday and if no better, she will cancel then.

## 2012-09-10 ENCOUNTER — Telehealth: Payer: Self-pay | Admitting: Internal Medicine

## 2012-09-10 ENCOUNTER — Encounter: Payer: Self-pay | Admitting: Internal Medicine

## 2012-09-10 ENCOUNTER — Ambulatory Visit (INDEPENDENT_AMBULATORY_CARE_PROVIDER_SITE_OTHER): Payer: 59 | Admitting: Internal Medicine

## 2012-09-10 VITALS — BP 118/76 | HR 62 | Temp 98.8°F | Wt 319.8 lb

## 2012-09-10 DIAGNOSIS — J309 Allergic rhinitis, unspecified: Secondary | ICD-10-CM

## 2012-09-10 DIAGNOSIS — R05 Cough: Secondary | ICD-10-CM

## 2012-09-10 DIAGNOSIS — I499 Cardiac arrhythmia, unspecified: Secondary | ICD-10-CM

## 2012-09-10 MED ORDER — LORATADINE 10 MG PO TABS: 10.0000 mg | ORAL_TABLET | Freq: Every day | ORAL | Status: DC

## 2012-09-10 MED ORDER — DOXYCYCLINE HYCLATE 100 MG PO TABS: 100.0000 mg | ORAL_TABLET | Freq: Two times a day (BID) | ORAL | Status: AC

## 2012-09-10 NOTE — Telephone Encounter (Signed)
Spoke with patient and she wants to cancel her procedure due to respiratory infection.  She will reschedule when she is feeling better. She understands she can be billed for late cancellation. Cancelled procedure with [facility] endo.

## 2012-09-10 NOTE — Assessment & Plan Note (Signed)
Symptomatic PVCs despite heart rate in the 60s on beta blocker Highly reactive to medication side effect, cardiology recommendations reviewed at length today Risk-benefit of treatment versus nontreatment discussed, okay to try therapy as listed above Patient to call cardiologist for worsening symptoms despite medication therapy

## 2012-09-10 NOTE — Progress Notes (Signed)
Subjective:    Patient ID: Cassie [name], female    DOB: 07/17/1960, 52 y.o.   MRN: [id number]  Cough This is a new problem. The current episode started 1 to 4 weeks ago. The problem has been waxing and waning. The problem occurs every few minutes. The cough is productive of sputum (clear foamy, occ green or brown). Associated symptoms include nasal congestion, postnasal drip and a sore throat. Pertinent negatives include no chills, fever, headaches, shortness of breath or weight loss. The symptoms are aggravated by pollens and dust. She has tried OTC cough suppressant for the symptoms. The treatment provided mild relief. Her past medical history is significant for bronchitis and environmental allergies. There is no history of asthma or COPD.   Past Medical History  Diagnosis Date  . Anxiety   . Panic disorder   . Other allergy, other than to medicinal agents   . Ulcerative colitis, unspecified   . Morbid obesity   . Vitamin B12 deficiency   . Vitamin D deficiency   . Cardiac arrhythmia     palpitations,PVC'sAC  . Chronic kidney disease     cyst right kidney  . Headache     migraines occ.sinus,tension per patient  . Arthritis     knees,thumbs  . Anemia     20 yrs. ago not now  . Adrenal nodule     Review of Systems  Constitutional: Negative for fever, chills and weight loss.  HENT: Positive for sore throat and postnasal drip.   Respiratory: Positive for cough. Negative for shortness of breath.   Allergic/Immunologic: Positive for environmental allergies.  Neurological: Negative for headaches.       Objective:   Physical Exam BP 118/76  Pulse 62  Temp(Src) 98.8 F (37.1 C) (Oral)  Wt 319 lb 12.8 oz (145.06 kg)  BMI 54.07 kg/m2  SpO2 99% Wt Readings from Last 3 Encounters:  09/10/12 319 lb 12.8 oz (145.06 kg)  07/25/12 317 lb (143.79 kg)  05/28/12 308 lb (139.708 kg)   Constitutional: She is obese, but appears well-developed and well-nourished. No distress.   HENT: Head: Normocephalic and atraumatic. Sinus nontender to palpation Ears: B cerumen, non-impacting -TMs ok, no erythema or effusion; Nose: pill mucosa, clear rhinorrhea evident. Mouth/Throat: Oropharynx is clear and moist. Mild erythema with cobblestoning. No oropharyngeal exudate.  Eyes: Conjunctivae and EOM are normal. Pupils are equal, round, and reactive to light. No scleral icterus.  Neck: Thick, Normal range of motion. Neck supple. No JVD present. No thyromegaly present.  Cardiovascular: Normal rate, regular rhythm and normal heart sounds.  No murmur heard. No BLE edema. Pulmonary/Chest: Effort normal and breath sounds diminished B bases. No respiratory distress. She has no wheezes.  Musculoskeletal: Normal range of motion, no joint effusions. No gross deformities Neurological: She is alert and oriented to person, place, and time. No cranial nerve deficit. Coordination, balance, strength, speech and gait are normal.  Psychiatric: She has a mildly anxious mood, but normal affect. Her behavior is normal. Judgment and thought content normal.   Lab Results  Component Value Date   WBC 9.6 05/28/2012   HGB 13.2 05/28/2012   HCT 39.9 05/28/2012   PLT 286.0 05/28/2012   GLUCOSE 102* 05/28/2012   GLUCOSE 102* 05/28/2012   CHOL 157 05/28/2012   TRIG 94.0 05/28/2012   HDL 30.30* 05/28/2012   LDLCALC 108* 05/28/2012   ALT 19 05/28/2012   AST 16 05/28/2012   NA 139 05/28/2012   NA 139 05/28/2012   K  4.5 05/28/2012   K 4.5 05/28/2012   CL 105 05/28/2012   CL 105 05/28/2012   CREATININE 0.9 05/28/2012   CREATININE 0.9 05/28/2012   BUN 9 05/28/2012   BUN 9 05/28/2012   CO2 27 05/28/2012   CO2 27 05/28/2012   TSH 1.78 05/28/2012   HGBA1C 6.0 09/14/2006        Assessment & Plan:   Cough, no evidence for infection on exam or history Allergic rhinitis, uncontrolled Multiple medical intolerances due to side effect exacerbating symptomatic PVCs PVC, rate controlled with beta blocker  Reviewed importance of  treating allergy and asthma -but general intolerance to antihistamines, decongestants and prednisone reviewed at length I feel risk versus benefit of treatment with Claritin is important to try this time given severity of uncontrolled symptoms -Patient understands and agrees, will call if symptomatic PVCs increase  Without the benefit of antibiotics, but given its duration of symptoms greater than 2 weeks and green discoloration of sputum on occasion, empiric doxycycline prescribed  Also okay to add Delsym to ongoing Mucinex/Robitussin  Patient agrees to call symptoms worse or unimproved

## 2012-09-10 NOTE — Patient Instructions (Signed)
It was good to see you today. We have reviewed your prior records including labs and tests today Begin Claritin 10 mg once daily to help control allergy symptoms provoking cough If you develop worsening symptoms or fever, we can reconsider antibiotics, but it does not appear necessary to use antibiotics at this time. If symptoms continue to worsen despite Claritin, begin doxycycline antibiotic twice daily as discussed Your prescription(s) have been submitted to your pharmacy. Please take as directed and contact our office if you believe you are having problem(s) with the medication(s). Okay to use Delsym if symptoms remain unimproved in the next 72 hours Continue Mucinex or Robitussin as ongoing Followup with cardiologist if cough symptoms unimproved to get "permission" to use other medications to treat her allergy and asthma as needed

## 2012-09-11 ENCOUNTER — Encounter: Payer: Self-pay | Admitting: Internal Medicine

## 2012-09-11 ENCOUNTER — Ambulatory Visit (HOSPITAL_COMMUNITY): Admission: RE | Admit: 2012-09-11 | Payer: 59 | Source: Ambulatory Visit | Admitting: Internal Medicine

## 2012-09-11 HISTORY — DX: Unspecified osteoarthritis, unspecified site: M19.90

## 2012-09-11 HISTORY — DX: Anemia, unspecified: D64.9

## 2012-09-11 HISTORY — DX: Other specified disorders of adrenal gland: E27.8

## 2012-09-11 HISTORY — DX: Disorder of adrenal gland, unspecified: E27.9

## 2012-09-11 SURGERY — COLONOSCOPY WITH PROPOFOL
Anesthesia: Monitor Anesthesia Care

## 2012-09-12 ENCOUNTER — Telehealth: Payer: Self-pay | Admitting: Internal Medicine

## 2012-09-12 NOTE — Telephone Encounter (Signed)
Noted, agree. Thanks.

## 2012-09-12 NOTE — Telephone Encounter (Signed)
[facility] Triage Call Report Triage Record Num: [id number] Operator: Hughes Better Patient Name: Cassie [name] Call Date & Time: 09/11/2012 6:26:52PM Patient Phone: [phone] PCP: Gwendolyn Grant Patient Gender: Female PCP Fax : [phone] Patient DOB: Apr 27, 1961 [facility] Reason for Call: Caller: [name]/Patient; PCP: Gwendolyn Grant (Adults only); CB#: [phone]; reason for call: evaluated on 09/10/2012: ? allergies. Patient started on Claritin and Doxycyline. Patient started Claritin on 09/10/2012: pharmacist deleted Doxycyline prescription. Pharmacy: [facility] at [phone]. Patient's chart accessed: prescription written on 09/10/2012: Doxycycline (VIBRA-TABS) 100 mg tablet, 2 times daily (#20 with NR). RN contacted Pharmacy and left this prescriptioin on voice mail. Patient notified of this information. No triage. Protocol(s) Used: Medication Questions - Adult Recommended Outcome per Protocol: Call Provider within 4 Hours Reason for Outcome: Prescription ordered today and not available at pharmacy putting patient at clinical risk

## 2012-09-14 ENCOUNTER — Encounter: Payer: 59 | Admitting: Internal Medicine

## 2012-10-10 ENCOUNTER — Other Ambulatory Visit: Payer: Self-pay | Admitting: Internal Medicine

## 2012-11-08 ENCOUNTER — Telehealth: Payer: Self-pay | Admitting: Internal Medicine

## 2012-11-08 ENCOUNTER — Ambulatory Visit (INDEPENDENT_AMBULATORY_CARE_PROVIDER_SITE_OTHER): Payer: 59 | Admitting: Internal Medicine

## 2012-11-08 ENCOUNTER — Encounter: Payer: Self-pay | Admitting: Internal Medicine

## 2012-11-08 ENCOUNTER — Ambulatory Visit (HOSPITAL_COMMUNITY)
Admission: RE | Admit: 2012-11-08 | Discharge: 2012-11-08 | Disposition: A | Discharge status: Home / Self Care | Payer: 59 | Source: Ambulatory Visit | Attending: Internal Medicine | Admitting: Internal Medicine

## 2012-11-08 ENCOUNTER — Other Ambulatory Visit (INDEPENDENT_AMBULATORY_CARE_PROVIDER_SITE_OTHER): Payer: 59

## 2012-11-08 VITALS — BP 128/82 | HR 76 | Temp 97.6°F | Resp 16 | Wt 306.0 lb

## 2012-11-08 DIAGNOSIS — L049 Acute lymphadenitis, unspecified: Secondary | ICD-10-CM

## 2012-11-08 DIAGNOSIS — Z0000 Encounter for general adult medical examination without abnormal findings: Secondary | ICD-10-CM

## 2012-11-08 DIAGNOSIS — R251 Tremor, unspecified: Secondary | ICD-10-CM | POA: Insufficient documentation

## 2012-11-08 DIAGNOSIS — R259 Unspecified abnormal involuntary movements: Secondary | ICD-10-CM

## 2012-11-08 DIAGNOSIS — M1710 Unilateral primary osteoarthritis, unspecified knee: Secondary | ICD-10-CM | POA: Insufficient documentation

## 2012-11-08 DIAGNOSIS — R05 Cough: Secondary | ICD-10-CM

## 2012-11-08 DIAGNOSIS — M25561 Pain in right knee: Secondary | ICD-10-CM

## 2012-11-08 DIAGNOSIS — M25569 Pain in unspecified knee: Secondary | ICD-10-CM | POA: Insufficient documentation

## 2012-11-08 DIAGNOSIS — E538 Deficiency of other specified B group vitamins: Secondary | ICD-10-CM

## 2012-11-08 LAB — TSH: TSH: 3.01 u[IU]/mL (ref 0.35–5.50)

## 2012-11-08 LAB — URINALYSIS, ROUTINE W REFLEX MICROSCOPIC
Ketones, ur: NEGATIVE
Urine Glucose: NEGATIVE
Urobilinogen, UA: 0.2 (ref 0.0–1.0)

## 2012-11-08 LAB — LIPID PANEL
Cholesterol: 180 mg/dL (ref 0–200)
HDL: 31.1 mg/dL — ABNORMAL LOW (ref >39.00)
Triglycerides: 130 mg/dL (ref 0.0–149.0)
VLDL: 26 mg/dL (ref 0.0–40.0)

## 2012-11-08 LAB — C-REACTIVE PROTEIN: CRP: 1.5 mg/dL (ref 0.5–20.0)

## 2012-11-08 LAB — BASIC METABOLIC PANEL
BUN: 10 mg/dL (ref 6–23)
CO2: 28 mEq/L (ref 19–32)
Chloride: 108 mEq/L (ref 96–112)
Creatinine, Ser: 1 mg/dL (ref 0.4–1.2)
Glucose, Bld: 106 mg/dL — ABNORMAL HIGH (ref 70–99)

## 2012-11-08 LAB — CBC WITH DIFFERENTIAL/PLATELET
Basophils Absolute: 0 10*3/uL (ref 0.0–0.1)
MCHC: 33 g/dL (ref 30.0–36.0)
MCV: 95.8 fl (ref 78.0–100.0)
Neutro Abs: 5.3 10*3/uL (ref 1.4–7.7)
Neutrophils Relative %: 72.9 % (ref 43.0–77.0)
Platelets: 279 10*3/uL (ref 150.0–400.0)
RDW: 14.4 % (ref 11.5–14.6)

## 2012-11-08 LAB — HEPATIC FUNCTION PANEL
ALT: 24 U/L (ref 0–35)
Albumin: 3.7 g/dL (ref 3.5–5.2)
Bilirubin, Direct: 0.1 mg/dL (ref 0.0–0.3)
Total Protein: 8.1 g/dL (ref 6.0–8.3)

## 2012-11-08 MED ORDER — UNABLE TO FIND: Status: DC

## 2012-11-08 MED ORDER — IBUPROFEN 600 MG PO TABS: 600.0000 mg | ORAL_TABLET | Freq: Two times a day (BID) | ORAL | Status: DC | PRN

## 2012-11-08 NOTE — Assessment & Plan Note (Signed)
X ray Ibuprofen Ortho ref

## 2012-11-08 NOTE — Telephone Encounter (Signed)
Informed the patient that the dx code is on the RX.

## 2012-11-08 NOTE — Assessment & Plan Note (Signed)
7/14 post-URI Declined Rx

## 2012-11-08 NOTE — Assessment & Plan Note (Signed)
7/14 B hands - worse Neurol ref

## 2012-11-08 NOTE — Telephone Encounter (Signed)
Pt has an RX for a wheel chair.  [facility] requires a DX also.  [place] wants [place] to fax it to [facility] at [phone] today before 5:00.  She is going there within the hour.

## 2012-11-08 NOTE — Assessment & Plan Note (Addendum)
ENT cons option was offered aspir bx next wk

## 2012-11-08 NOTE — Progress Notes (Signed)
   Subjective:    Patient ID: Cassie [name], female    DOB: 1960-05-15, 52 y.o.   MRN: [id number]    C/o R knee pain 7/10 in intensity  x3-4 d in the back and outer side. C/o hand tremor, cough  The patient presents for a follow-up of chronic palpitations - better, anxiety, B12 and vit D def controlled with medicines  C/o more swelling on the back of her neck...  Wt Readings from Last 3 Encounters:  11/08/12 306 lb (138.801 kg)  09/10/12 319 lb 12.8 oz (145.06 kg)  07/25/12 317 lb (143.79 kg)        Review of Systems  Constitutional: Negative for chills, activity change, appetite change, fatigue and unexpected weight change.  HENT: Negative for congestion, mouth sores and sinus pressure.   Eyes: Negative for visual disturbance.  Respiratory: Negative for cough and chest tightness.   Cardiovascular: Positive for palpitations.  Gastrointestinal: Negative for nausea and abdominal pain.  Genitourinary: Negative for frequency, difficulty urinating and vaginal pain.  Musculoskeletal: Negative for back pain and gait problem.  Skin: Negative for pallor and rash. Post neck sq swelling Neurological: Positive for dizziness and light-headedness. Negative for tremors, weakness, numbness and headaches.  Psychiatric/Behavioral: Negative for confusion and sleep disturbance.  Fatty tumor LUQ - tender     Objective:   Physical Exam  Constitutional: She appears well-developed. No distress.       Obese  HENT:  Head: Normocephalic.  Right Ear: External ear normal.  Left Ear: External ear normal.  Nose: Nose normal.  Mouth/Throat: Oropharynx is clear and moist.  Eyes: Conjunctivae normal are normal. Pupils are equal, round, and reactive to light. Right eye exhibits no discharge. Left eye exhibits no discharge.  Neck: Normal range of motion. Neck supple. No JVD present. No tracheal deviation present. No thyromegaly present.  Cardiovascular: Normal rate, regular rhythm and normal heart  sounds.   Pulmonary/Chest: No stridor. No respiratory distress. She has no wheezes.  Abdominal: Soft. Bowel sounds are normal. She exhibits no distension and no mass. There is no tenderness. There is no rebound and no guarding.  Musculoskeletal: She exhibits no edema and no tenderness.  Lymphadenopathy:    She has no cervical adenopathy.  Neurological: She displays normal reflexes. No cranial nerve deficit. She exhibits normal muscle tone. Coordination normal.  Skin: No rash noted. No erythema.  Psychiatric: She has a normal mood and affect. Her behavior is normal. Judgment and thought content normal.  R posterolat knee is tender; no swelling Mild hand tremor R,L R post neck tender swelling  Lab Results  Component Value Date   WBC 8.9 01/27/2012   HGB 13.1 01/27/2012   HCT 40.2 01/27/2012   PLT 265.0 01/27/2012   GLUCOSE 104* 01/27/2012   CHOL 177 01/27/2012   TRIG 83.0 01/27/2012   HDL 34.10* 01/27/2012   LDLCALC 126* 01/27/2012   ALT 19 01/27/2012   AST 18 01/27/2012   NA 142 01/27/2012   K 4.6 01/27/2012   CL 109 01/27/2012   CREATININE 0.9 01/27/2012   BUN 11 01/27/2012   CO2 25 01/27/2012   TSH 3.44 01/27/2012   HGBA1C 6.0 09/14/2006         Assessment & Plan:

## 2012-11-10 NOTE — Assessment & Plan Note (Signed)
Wt Readings from Last 3 Encounters:  11/08/12 306 lb (138.801 kg)  09/10/12 319 lb 12.8 oz (145.06 kg)  07/25/12 317 lb (143.79 kg)  better

## 2012-11-10 NOTE — Assessment & Plan Note (Signed)
Continue with current prescription therapy as reflected on the Med list.  

## 2012-11-13 ENCOUNTER — Other Ambulatory Visit: Payer: Self-pay | Admitting: Internal Medicine

## 2012-11-13 DIAGNOSIS — M25561 Pain in right knee: Secondary | ICD-10-CM

## 2012-11-16 ENCOUNTER — Ambulatory Visit: Payer: 59 | Admitting: Internal Medicine

## 2012-11-17 ENCOUNTER — Encounter: Payer: Self-pay | Admitting: Internal Medicine

## 2012-11-20 ENCOUNTER — Other Ambulatory Visit: Payer: 59

## 2012-11-20 DIAGNOSIS — M25561 Pain in right knee: Secondary | ICD-10-CM

## 2012-11-20 LAB — D-DIMER, QUANTITATIVE: D-Dimer, Quant: 0.27 ug/mL-FEU (ref 0.00–0.48)

## 2012-12-05 ENCOUNTER — Telehealth: Payer: Self-pay | Admitting: *Deleted

## 2012-12-05 MED ORDER — ALPRAZOLAM 1 MG PO TABS: 0.5000 mg | ORAL_TABLET | Freq: Two times a day (BID) | ORAL | Status: DC

## 2012-12-05 NOTE — Telephone Encounter (Signed)
Done

## 2012-12-05 NOTE — Telephone Encounter (Signed)
Rf req for Alprazolam 1 mg 1/2 in am, 1/2 at lunch,1/2 in pm and 1 po qhs. #75. Ok to Rf?

## 2012-12-05 NOTE — Telephone Encounter (Signed)
OK to fill this prescription with additional refills x5 Thank you!  

## 2012-12-14 ENCOUNTER — Encounter: Payer: Self-pay | Admitting: Internal Medicine

## 2012-12-17 ENCOUNTER — Telehealth: Payer: Self-pay | Admitting: Internal Medicine

## 2012-12-17 NOTE — Telephone Encounter (Signed)
Pt will need a new OV before rescheduling. She canceled in 2008, 09/2012 and now. .Would nor reschedule without being seen again

## 2012-12-17 NOTE — Telephone Encounter (Signed)
Spoke with patient and she is healthy at this point. She would like to reschedule her hospital colonoscopy(Prop). She will be way Sept 8-17 and Oct 30th. Please, advise.

## 2012-12-18 NOTE — Telephone Encounter (Signed)
Spoke with patient and scheduled patient on 01/29/13 at 8:15 AM.

## 2013-01-02 ENCOUNTER — Other Ambulatory Visit: Payer: Self-pay | Admitting: Internal Medicine

## 2013-01-29 ENCOUNTER — Ambulatory Visit: Payer: 59 | Admitting: Internal Medicine

## 2013-02-01 ENCOUNTER — Encounter: Payer: Self-pay | Admitting: Internal Medicine

## 2013-02-04 ENCOUNTER — Other Ambulatory Visit: Payer: Self-pay | Admitting: Internal Medicine

## 2013-02-04 MED ORDER — VITAMIN B-12 1000 MCG SL SUBL: 1.0000 | SUBLINGUAL_TABLET | Freq: Every day | SUBLINGUAL | Status: DC

## 2013-02-27 ENCOUNTER — Other Ambulatory Visit: Payer: Self-pay | Admitting: Internal Medicine

## 2013-03-12 ENCOUNTER — Encounter: Payer: Self-pay | Admitting: Internal Medicine

## 2013-03-12 ENCOUNTER — Ambulatory Visit (INDEPENDENT_AMBULATORY_CARE_PROVIDER_SITE_OTHER): Payer: 59 | Admitting: Internal Medicine

## 2013-03-12 VITALS — BP 126/82 | HR 64 | Ht 65.0 in | Wt 279.0 lb

## 2013-03-12 DIAGNOSIS — R1032 Left lower quadrant pain: Secondary | ICD-10-CM

## 2013-03-12 DIAGNOSIS — N289 Disorder of kidney and ureter, unspecified: Secondary | ICD-10-CM

## 2013-03-12 DIAGNOSIS — K5100 Ulcerative (chronic) pancolitis without complications: Secondary | ICD-10-CM

## 2013-03-12 NOTE — Patient Instructions (Addendum)
You have been scheduled for a colonoscopy with propofol. Please follow written instructions given to you at your visit today.   If you use inhalers (even only as needed), please bring them with you on the day of your procedure. Your physician has requested that you go to www.startemmi.com and enter the access code given to you at your visit today. This web site gives a general overview about your procedure. However, you should still follow specific instructions given to you by our office regarding your preparation for the procedure.  You have been scheduled for a CT scan of the abdomen at [facility] ([address]---this is in the same building as [facility]).   You are scheduled on 03/14/13 at 1:00 pm. You should arrive 15 minutes prior to your appointment time for registration. Please follow the written instructions below on the day of your exam:  WARNING: IF YOU ARE ALLERGIC TO IODINE/X-RAY DYE, PLEASE NOTIFY RADIOLOGY IMMEDIATELY AT [phone]! YOU WILL BE GIVEN A 13 HOUR PREMEDICATION PREP.  1) Do not eat or drink anything after 9:00 am (4 hours prior to your test) 2) You have been given 2 bottles of oral contrast to drink. The solution may taste better if refrigerated, but do NOT add ice or any other liquid to this solution. Shake well before drinking.    Drink 1 bottle of contrast @ 11:00 am (2 hours prior to your exam)  Drink 1 bottle of contrast @ 12:00 pm (1 hour prior to your exam)  You may take any medications as prescribed with a small amount of water except for the following: Metformin, Glucophage, Glucovance, Avandamet, Riomet, Fortamet, Actoplus Met, Janumet, Glumetza or Metaglip. The above medications must be held the day of the exam AND 48 hours after the exam.  The purpose of you drinking the oral contrast is to aid in the visualization of your intestinal tract. The contrast solution may cause some diarrhea. Before your exam is started, you will be given  a small amount of fluid to drink. Depending on your individual set of symptoms, you may also receive an intravenous injection of x-ray contrast/dye. Plan on being at [facility] for 30 minutes or long, depending on the type of exam you are having performed.  This test typically takes 30-45 minutes to complete.  If you have any questions regarding your exam or if you need to reschedule, you may call the CT department at [phone] between the hours of 8:00 am and 5:00 pm, Monday-Friday.  ________________________________________________________________________

## 2013-03-12 NOTE — Progress Notes (Signed)
Cassie [name] September 06, 1960 MRN [id number]  History of Present Illness:  This is a 52 year old white female who is here to discuss a colonoscopy. She has had several appointments for a colonoscopy. The first one was scheduled in 2008, the second one in May 2014 and the last one in August 2014, none of which she actually came for. She has a history of left-sided ulcerative colitis diagnosed in [place] 30 years ago. Her last colonoscopy was in 2001 and showed active left sided colitis from 0-50 cm. She is not on any medications for her colitis. She has a history of anxiety and fatty liver as well as morbid obesity. She has lost 50 pounds in the last 5 months. She and her husband have been on a  high-fiber, low-fat diet. In October 2013, she was found to have a small lesion in the right kidney uncertain if a cyst or solid lesion. A repeat CT was suggested within 6 months. Patient denies rectal bleeding or diarrhea. She is reporting intermittent mild left lower quadrant abdominal pain.   Past Medical History  Diagnosis Date  . Anxiety   . Panic disorder   . Other allergy, other than to medicinal agents   . Ulcerative colitis, unspecified   . Morbid obesity   . Vitamin B12 deficiency   . Vitamin D deficiency   . Cardiac arrhythmia     palpitations,PVC'sAC  . Chronic kidney disease     cyst right kidney  . Headache(784.0)     migraines occ.sinus,tension per patient  . Arthritis     knees,thumbs  . Anemia     20 yrs. ago not now  . Adrenal nodule    Past Surgical History  Procedure Laterality Date  . Breast biopsy  11/2011    Left- benign  . Colonoscopy    . Sigmoidoscopy      reports that she quit smoking about 34 years ago. Her smoking use included Cigarettes. She smoked 0.00 packs per day. She has never used smokeless tobacco. She reports that she does not drink alcohol or use illicit drugs. family history includes Atrial fibrillation in her father; Heart disease (age of onset:  57) in her father. There is no history of Colon cancer. Allergies  Allergen Reactions  . Diphenhydramine Hcl     REACTION: unspecified  . Mesalamine     REACTION: unspecified. Does not remember taking.  Marland Kitchen Penicillins     REACTION: unspecified  . Prednisone     REACTION: unspecified  . Sudafed [Pseudoephedrine Hcl]   . Sulfasalazine     REACTION: unspecified  . Caffeine Palpitations        Review of Systems:LLQ abd pain  The remainder of the 10 point ROS is negative except as outlined in H&P   Physical Exam: General appearance  Well developed, in no distress. Overweight Eyes- non icteric. HEENT nontraumatic, normocephalic. Mouth no lesions, tongue papillated, no cheilosis. Neck supple without adenopathy, thyroid not enlarged, no carotid bruits, no JVD. Lungs Clear to auscultation bilaterally. Cor normal S1, normal S2, regular rhythm, no murmur,  quiet precordium. Abdomen: Obese soft with normal active bowel sounds. No tenderness. No fullness. Liver edge at costal margin. Rectal: Not done. Extremities no pedal edema. Skin no lesions. Neurological alert and oriented x 3. Psychological normal mood and affect.  Assessment and Plan:  Problem #78 52 year old white female with  history of left-sided ulcerative colitis who is due for  screening colonoscopy. She is asymptomatic except for left lower quadrant abdominal  discomfort. She has had a lot of anxiety concerning her procedures; specifically, the use of propofol. We have again discussed at length the procedure, the prep as well as the sedation. She would like to schedule the procedure. Since she has lost 50 pounds, her BMI has decreased to 46 and we will be able to do her procedure in [place].  Problem #2 Right renal lesion. We will schedule a CT scan of the right kidney and adrenal glands for followup and stability.  Problem #3 Morbid obesity. Patient has lost 50 pounds in the last several months. She continues to lose weight. I  complimented her on her effort.   03/12/2013 Delfin Edis

## 2013-03-13 ENCOUNTER — Encounter: Payer: Self-pay | Admitting: Internal Medicine

## 2013-03-14 ENCOUNTER — Telehealth: Payer: Self-pay | Admitting: *Deleted

## 2013-03-14 ENCOUNTER — Other Ambulatory Visit: Payer: Self-pay

## 2013-03-14 ENCOUNTER — Encounter: Payer: Self-pay | Admitting: Internal Medicine

## 2013-03-14 ENCOUNTER — Other Ambulatory Visit: Payer: 59

## 2013-03-14 NOTE — Telephone Encounter (Signed)
Patient wants to be sure Dr. Olevia Perches had included checking her adrenal glands in the CT.

## 2013-03-14 NOTE — Telephone Encounter (Signed)
She will have the ultrasound instead.

## 2013-03-14 NOTE — Telephone Encounter (Signed)
Spoke with patient and gave her the recommendations. Cancelled CT scan.

## 2013-03-14 NOTE — Telephone Encounter (Signed)
Cassie [name] I have reviewed again the CT scan and the subsequent ultrasound of the right kidney done in March 2014 and now it appears that they are requesting followup ultrasound in 1 year which would be in March 2015. So I did not call the precertification coordinator about the CT scan , because it appears that the ultrasound is an acceptable followup imaging . Please call patient with change of plans.Cassie [name] CT scan and we will do her right renal ltrasound in March 2015 which may be scheduledafter new year. In the meantime she has been scheduled for colonoscopy with me. Thanx

## 2013-03-19 ENCOUNTER — Other Ambulatory Visit: Payer: 59

## 2013-03-25 ENCOUNTER — Encounter: Payer: Self-pay | Admitting: Internal Medicine

## 2013-03-25 ENCOUNTER — Ambulatory Visit (INDEPENDENT_AMBULATORY_CARE_PROVIDER_SITE_OTHER): Payer: 59 | Admitting: Internal Medicine

## 2013-03-25 VITALS — BP 116/72 | HR 60 | Temp 98.5°F | Resp 10 | Ht 65.5 in | Wt 279.0 lb

## 2013-03-25 DIAGNOSIS — E278 Other specified disorders of adrenal gland: Secondary | ICD-10-CM

## 2013-03-25 MED ORDER — DEXAMETHASONE 1 MG PO TABS: 1.0000 mg | ORAL_TABLET | Freq: Once | ORAL | Status: DC

## 2013-03-25 NOTE — Progress Notes (Addendum)
Patient ID: Cassie [name], female   DOB: November 02, 1960, 52 y.o.   MRN: [id number]   HPI  Cassie [name] is a 52 y.o.-year-old female, referred by Dr. Delfin Edis, for evaluation and management bilateral adrenal incidentaloma.  Patient was incidentally found to have 2 adrenal nodules on a CT scan obtained before her colonoscopy from 2013. I reviewed pt's previous CT scan images from 02/10/2012: 31.5 HU nodule in the left adrenal gland measures 1.1 x 1.7 cm. Left adrenal nodule measures 9.5 HU nodule in the right adrenal gland measures 1 cm. Pt is worried especially about her Right adrenal nodule as it is more intense.  She does not have hot flushes, while on HRT, but has cold flushes. She does not have HTN. She has palpitations for years, but they resolved in the last 8 mo. She is on Atenolol. She has bilateral knee pain. She also has some subcutaneous nodules fo - one on arm and one on the back of neck - for last 4 years. She lost 48 lbs since 10/2012 - by changing the diet and reducing portion size. Occasional HAs. She does not have DM. Has UC. She has been on Prednisone for the UC years ago and also got inj with steroids in the past. She has thinning hair - androgenic alopecia  - for the last 10 years, stabilized in the last few years. She had lightheadedness, now better, she also has tremors. She has been on HRT for the last 2 years, previously on OCP.  ROS: Constitutional: no weight gain/loss, no fatigue, no subjective hyperthermia/hypothermia Eyes: no blurry vision, no xerophthalmia ENT: no sore throat, no nodules palpated in throat, no dysphagia/odynophagia, no hoarseness; + tinnitus, + lumps in neck (post.) Cardiovascular: no CP/SOB/+ palpitations/+ leg swelling Respiratory: no cough/SOB Gastrointestinal: no N/V/+ D/no C Musculoskeletal: no muscle/+ joint aches Skin: no rashes, + hair loss Neurological: no tremors/numbness/tingling/dizziness, + HA Psychiatric: no depression/anxiety  Past  Medical History  Diagnosis Date  . Anxiety   . Panic disorder   . Other allergy, other than to medicinal agents   . Ulcerative colitis, unspecified   . Morbid obesity   . Vitamin B12 deficiency   . Vitamin D deficiency   . Cardiac arrhythmia     palpitations,PVC'sAC  . Chronic kidney disease     cyst right kidney  . Headache(784.0)     migraines occ.sinus,tension per patient  . Arthritis     knees,thumbs  . Anemia     20 yrs. ago not now  . Adrenal nodule    Past Surgical History  Procedure Laterality Date  . Breast biopsy  11/2011    Left- benign  . Colonoscopy    . Sigmoidoscopy     History   Social History  . Marital Status: Married    Spouse Name: N/A    Number of Children: 0  . Years of Education: N/A   Occupational History  . Housewife    Social History Main Topics  . Smoking status: Former Smoker    Types: Cigarettes    Quit date: 03/07/1979  . Smokeless tobacco: Never Used  . Alcohol Use: No  . Drug Use: No  . Sexual Activity: Yes    Partners: Male   Social History Narrative   Married: 0 kids   Regular exercise: walks 2 times a week   Caffeine use: none   Current Outpatient Prescriptions on File Prior to Visit  Medication Sig Dispense Refill  . ALPRAZolam (XANAX) 1 MG  tablet Take 0.5-1 tablets (0.5-1 mg total) by mouth 2 (two) times daily. Take 0.5 mg in the am and 1 mg in the pm  75 tablet  5  . cholecalciferol (VITAMIN D) 1000 UNITS tablet Take 3,000 Units by mouth daily.      . Cranberry 400 MG CAPS Take 400 mg by mouth daily.      . Cyanocobalamin (VITAMIN B-12) 1000 MCG SUBL Place 1 tablet (1,000 mcg total) under the tongue daily.  100 tablet  3  . guaifenesin (ROBITUSSIN) 100 MG/5ML syrup Take 200 mg by mouth 3 (three) times daily as needed for cough.      Marland Kitchen ibuprofen (ADVIL,MOTRIN) 600 MG tablet Take 1 tablet (600 mg total) by mouth 2 (two) times daily as needed for pain.  60 tablet  1  . norethindrone-ethinyl estradiol (FEMHRT LOW DOSE)  0.5-2.5 MG-MCG per tablet Take 1 tablet by mouth daily.  28 tablet  0  . Probiotic Product (PROBIOTIC DAILY PO) Take 1 capsule by mouth daily.      Marland Kitchen UNABLE TO FIND Please dispense one large wheelchair. Dx: 719.46  1 each  0   No current facility-administered medications on file prior to visit.   Allergies  Allergen Reactions  . Diphenhydramine Hcl     REACTION: unspecified  . Mesalamine     REACTION: unspecified. Does not remember taking.  Marland Kitchen Penicillins     REACTION: unspecified  . Prednisone     REACTION: unspecified  . Sudafed [Pseudoephedrine Hcl]   . Sulfasalazine     REACTION: unspecified  . Caffeine Palpitations   Family History  Problem Relation Age of Onset  . Heart disease Father 40    pacemaker  . Atrial fibrillation Father   . Colon cancer Neg Hx    PE: BP 116/72  Pulse 60  Temp(Src) 98.5 F (36.9 C) (Oral)  Resp 10  Ht 5' 5.5" (1.664 m)  Wt 279 lb (126.554 kg)  BMI 45.71 kg/m2  SpO2 97% Wt Readings from Last 3 Encounters:  03/25/13 279 lb (126.554 kg)  03/12/13 279 lb (126.554 kg)  11/08/12 306 lb (138.801 kg)   Constitutional: obese, in NAD Eyes: PERRLA, EOMI, no exophthalmos, no lid lag, no stare ENT: moist mucous membranes, no thyromegaly, no cervical lymphadenopathy Cardiovascular: RRR, No MRG Respiratory: CTA B Gastrointestinal: abdomen soft, NT, ND, BS+ Musculoskeletal: no deformities, strength intact in all 4 Skin: moist, warm, no rashes Neurological: + mild tremor with outstretched hands, DTR normal in all 4  ASSESSMENT: 1. Thyrotoxicosis  PLAN:  1. Patient with 2 (bilateral) adrenal nodules discovered incidentally.  - We discussed about the fact that there are 3 possible scenarios: - A nonfunctioning adrenal nodule - A functioning adrenal adenoma - which can hypersecrete catecholamines/metanephrines, cortisol, or aldosterone - Adrenal cancer/mets We do not have blood tests to check for cancer, but the best indicator is lack of change  in size and appearance over time. Hormone levels will also help (see below) - To differentiate between a functioning and a nonfunctioning adrenal nodule, we'll need to rule out hypersecretion by checking the following tests  - dexamethasone suppression test to rule out Cushing syndrome (6% of adrenal incidentalomas) - this might be a problem because patient is on HRT, known to interfere with the test by increasing cortisol binding globulin >> false positive tests. If the cortisol level returns >5, will need 24h urine free cortisol - Plasma fractionated metanephrines and catecholamines to rule out pheochromocytoma (3% of adrenal incidentalomas) -  Plasma renin activity and aldosterone level to rule out primary hyperaldosteronism (0.6% of adrenal incidentalomas) - I ordered the above tests today. I advised pt to take the dexamethasone tablets (1 mg total dose, sent to pharmacy) at 11 PM the night before coming to the lab to have a cortisol level drawn. I also added dexamethasone level. - we discussed about the fact that, while a nodule of <10 HU is almost 100% indicated of a benign mass, and intensity of 31 HU is not definitive for an adrenal adenoma or cancer. - We discussed about the need for an other CT scan/MRI, this time focused on adrenals, now a year after previous. If we check a CT scan, we might not need to get the contrasted CT for washout. Pt had sever diarrhea after previous CT with po contrast and wonders if we can check an MRI instead - I will need to d/w radiology if the MRI is OK.   - we discussed about f/u:   hormonal testing yearly for 5 years   CT scans yearly x1-2 - I explained all the above to the patient, and she agrees with the plan. - I will see her back in a year  Office Visit on 03/25/2013  Component Date Value Range Status  . PRA LC/MS/MS 03/25/2013 3.69  0.25 - 5.82 ng/mL/h Final  . ALDO / PRA Ratio 03/25/2013 2.4  0.9 - 28.9 Ratio Final  . Aldosterone 03/25/2013 9    Final   Comment:                               Adult Reference Ranges for Aldosterone,                             LC/MS/MS:                                                         Upright 8:00-10:00 am    < or = 28 ng/dL                              Upright 4:00-6:00 pm     < or = 21 ng/dL                              Supine  8:00-10:00 am    3-16 ng/dL  . Potassium 03/25/2013 4.2  3.5 - 5.1 mEq/L Final  . Epinephrine 03/25/2013 45   Final  . Norepinephrine 03/25/2013 365   Final  . Dopamine 03/25/2013 REPORT   Final   Comment: Results are below reportable range for this analyte,                          which is 30 pg/mL.  . Catecholamines, Total 03/25/2013 410   Final   Comment: Adult Reference Ranges for Catecholamines, Plasma                          Epinephrine         Supine:  LESS THAN 50 pg/mL  Upright: LESS THAN 95 pg/mL                          Norepinephrine      Supine:  112-658 pg/mL                                              Upright: [id number] pg/mL                          Dopamine            Supine:  LESS THAN 30 pg/mL                                              Upright: LESS THAN 30 pg/mL                          Total (N+E)         Supine:  123-671 pg/mL                                              Upright: [id number] pg/mL                          Pediatric Reference Ranges for Catecholamines, Plasma                          Due to stress, plasma catecholamine levels are                          generally unreliable in infants and small children.                          Urinary catecholamine assays are more reliable.                          Epinephrine         Supine:  LESS THAN OR EQUAL                             3-15 Years                TO 464 pg/mL                                              Upright: Not Available                          Norepinephrine      Supine:  LESS THAN OR EQUAL                              3-15 Years  TO 1251 pg/mL                                              Upright: Not Available                          Dopamine            Supine:  LESS THAN 60 pg/mL                             3-15 Years       Upright: Not Available                          Pediatric data from [facility] [phone].  Joseph Pierini, Free 03/25/2013 <25  <=57 pg/mL Final  . Normetanephrine, Free 03/25/2013 48  <=148 pg/mL Final  . Total Metanephrines-Plasma 03/25/2013 48  <=205 pg/mL Final   Comment: For more information on this test, go to                          http://education.questdiagn[facility].com/faq/MetFractFree                          Elevations >4-fold upper reference range: strongly                          suggestive of a pheochromocytoma(1).                          Elevations >1- 4-fold upper reference range:                          significant but not diagnostic, may be due to                          medications or stress. Suggest running 24 hr urine                          fractionated metanephrines and/or serum Chromagranin A                          for confirmation.                          Reference:                          (1)Algeciras-Schimnich A et al, Plasma Chromogranin A                          or Urine Fractionated Metanephrines Follow-Up Testing                          Improves the Diagnostic Accuracy of Plasma Fractionated                          Metanephrines for Pheochromocytoma. The Journal of  Clinical Endocrinology # Metabolism 93(1), 91-95, 2008.   Msg sent: Dear Ms Beatrix Shipper, The tests for pheochromocytoma and primary aldosteronoma are negative, which is great. The cortisol level and the MRI is pending. Please let me know if you have any questions. Sincerely, Philemon Kingdom MD  05/22/2013 - MRI report still pending - will addend results when available.  05/28/2013 Halina Andreas, MD 05/28/2013          Narrative        CLINICAL DATA: Followup adrenal lesions. Indeterminate renal lesions on CTA Switch  EXAM: MRI ABDOMEN WITHOUT AND WITH CONTRAST  TECHNIQUE: Multiplanar multisequence MR imaging of the abdomen was performed both before and after the administration of intravenous contrast.  CONTRAST: 64m MULTIHANCE GADOBENATE DIMEGLUMINE 529 MG/ML IV SOLN  COMPARISON: [facility] dated 07/30/2012; CT ABD/PELVIS W CM dated 02/10/2012  FINDINGS: Adrenals: Enlargement of the left adrenal gland to 23 x 14 mm is similar to comparison CT. There is loss of signal intensity on opposed phase imaging consistent with a benign adenoma. Mild nodular enlargement of the right adrenal gland to 11 mm is also unchanged. The right adrenal gland demonstrates loss signal intensity on opposed phase imaging consistent with a benign adenoma.  Lung bases are clear. No focal hepatic lesion. Multiple gallstones within the gallbladder. No biliary duct dilatation. Pancreas, S and spleen are normal. There peripelvic cysts within the left right kidney. No enhancing a cortical lesion.  The stomach and limited view of the small bowel colon are unremarkable. No retroperitoneal adenopathy. No aggressive osseous lesion.  IMPRESSION: 1. Bilateral small benign adrenal adenomas.  2. Cholelithiasis   Electronically Signed By: SSuzy BouchardM.D. On: 05/28/2013 08:42

## 2013-03-25 NOTE — Patient Instructions (Addendum)
Please return in a year. Please take Dexamethasone 1 mg at 11 pm the night before coming to the lab in am for a cortisol level. I will send you the labs through [facility] - Please stop at the lab.

## 2013-03-28 LAB — CATECHOLAMINES, FRACTIONATED, PLASMA: Catecholamines, Total: 410 pg/mL

## 2013-03-29 ENCOUNTER — Encounter: Payer: Self-pay | Admitting: Internal Medicine

## 2013-03-29 ENCOUNTER — Telehealth: Payer: Self-pay | Admitting: Internal Medicine

## 2013-03-29 ENCOUNTER — Encounter: Payer: Self-pay | Admitting: *Deleted

## 2013-03-29 ENCOUNTER — Ambulatory Visit (AMBULATORY_SURGERY_CENTER): Payer: 59 | Admitting: Internal Medicine

## 2013-03-29 VITALS — BP 128/65 | HR 55 | Temp 97.0°F | Resp 26 | Ht 65.0 in | Wt 279.0 lb

## 2013-03-29 DIAGNOSIS — Z1211 Encounter for screening for malignant neoplasm of colon: Secondary | ICD-10-CM

## 2013-03-29 DIAGNOSIS — K5190 Ulcerative colitis, unspecified, without complications: Secondary | ICD-10-CM

## 2013-03-29 DIAGNOSIS — K5100 Ulcerative (chronic) pancolitis without complications: Secondary | ICD-10-CM

## 2013-03-29 DIAGNOSIS — K5289 Other specified noninfective gastroenteritis and colitis: Secondary | ICD-10-CM

## 2013-03-29 MED ORDER — BUDESONIDE 3 MG PO CP24: 9.0000 mg | ORAL_CAPSULE | Freq: Every day | ORAL | Status: DC

## 2013-03-29 MED ORDER — SODIUM CHLORIDE 0.9 % IV SOLN: 500.0000 mL | INTRAVENOUS | Status: DC

## 2013-03-29 NOTE — Telephone Encounter (Signed)
Pt states she called her pharmacy and a 30 day supply of the pentasa 500 mg tid would be 400.00 dollars and she cannot afford this. She states she would have to sell her home to afford this. She wanted me to ask if the sulfasalazine would be a possibility . Please advise. ewm

## 2013-03-29 NOTE — Telephone Encounter (Signed)
The best thing for her would be Mesalamine, but she reported allergy, so I am concerned about possible sideffects.She is the only who knows what happened when she took it. If she wants to try, we will start Pentasa 55m, 2 po tid,#180, 3 refills. It will be in place of Entecort..Please send Pentasa.

## 2013-03-29 NOTE — Telephone Encounter (Signed)
Pt given advise per dr Olevia Perches. Pt  Now states she thinks pentasa may be too expensive and she wants to call and check prices before the medicine is called in. She will call me back in the lec and tell me what she chooses to do. ewm

## 2013-03-29 NOTE — Progress Notes (Signed)
Pt. Wanted to know if she should take endocort with up coming Adrenal gland testing.  Dr. Olevia Perches advised that she hold endocort until after Adrenal gland testing.  Pt. Went to bathroom.  States she had fresh blood on toliet paper (had multiple colon biopsies)  Also states she noted blood in commode, She flushed, nurse unable to observe.  Advised to call office if bleeding increases or if she feels faint or lightheaded.  Patient did not experience any of the following events: a burn prior to discharge; a fall within the facility; wrong site/side/patient/procedure/implant event; or a hospital transfer or hospital admission upon discharge from the facility. (G8907)Patient did not have preoperative order for IV antibiotic SSI prophylaxis. [phone])

## 2013-03-29 NOTE — Progress Notes (Signed)
Report to pacu rn, vss, bbs=clear 

## 2013-03-29 NOTE — Telephone Encounter (Signed)
Sulfasalazine 559m, ##349 2 po bid, Folic acid 124m #1#1791 po qd. Sulfasalazine is not my first or second choice but we can try.

## 2013-03-29 NOTE — Patient Instructions (Signed)
YOU HAD AN ENDOSCOPIC PROCEDURE TODAY AT [facility]: Refer to the procedure report that was given to you for any specific questions about what was found during the examination.  If the procedure report does not answer your questions, please call your gastroenterologist to clarify.  If you requested that your care partner not be given the details of your procedure findings, then the procedure report has been included in a sealed envelope for you to review at your convenience later.  YOU SHOULD EXPECT: Some feelings of bloating in the abdomen. Passage of more gas than usual.  Walking can help get rid of the air that was put into your GI tract during the procedure and reduce the bloating. If you had a lower endoscopy (such as a colonoscopy or flexible sigmoidoscopy) you may notice spotting of blood in your stool or on the toilet paper. If you underwent a bowel prep for your procedure, then you may not have a normal bowel movement for a few days.  DIET: Your first meal following the procedure should be a light meal and then it is ok to progress to your normal diet.  A half-sandwich or bowl of soup is an example of a good first meal.  Heavy or fried foods are harder to digest and may make you feel nauseous or bloated.  Likewise meals heavy in dairy and vegetables can cause extra gas to form and this can also increase the bloating.  Drink plenty of fluids but you should avoid alcoholic beverages for 24 hours.  ACTIVITY: Your care partner should take you home directly after the procedure.  You should plan to take it easy, moving slowly for the rest of the day.  You can resume normal activity the day after the procedure however you should NOT DRIVE or use heavy machinery for 24 hours (because of the sedation medicines used during the test).    SYMPTOMS TO REPORT IMMEDIATELY: A gastroenterologist can be reached at any hour.  During normal business hours, 8:30 AM to 5:00 PM Monday through Friday,  call [phone].  After hours and on weekends, please call the GI answering service at [phone] who will take a message and have the physician on call contact you.   Following lower endoscopy (colonoscopy or flexible sigmoidoscopy):  Excessive amounts of blood in the stool  Significant tenderness or worsening of abdominal pains  Swelling of the abdomen that is new, acute  Fever of 100F or higher FOLLOW UP: If any biopsies were taken you will be contacted by phone or by letter within the next 1-3 weeks.  Call your gastroenterologist if you have not heard about the biopsies in 3 weeks.  Our staff will call the home number listed on your records the next business day following your procedure to check on you and address any questions or concerns that you may have at that time regarding the information given to you following your procedure. This is a courtesy call and so if there is no answer at the home number and we have not heard from you through the emergency physician on call, we will assume that you have returned to your regular daily activities without incident.  SIGNATURES/CONFIDENTIALITY: You and/or your care partner have signed paperwork which will be entered into your electronic medical record.  These signatures attest to the fact that that the information above on your After Visit Summary has been reviewed and is understood.  Full responsibility of the confidentiality of this discharge  information lies with you and/or your care-partner.   Dr. Nichola Sizer office will call you for follow-up appointment.  Start Endocort after you have your Adrenal gland testing per Dr. Olevia Perches.  Next colonscopy 3 years-2017

## 2013-03-29 NOTE — Op Note (Signed)
[place]  [address] [place], [id number]   COLONOSCOPY PROCEDURE REPORT  PATIENT: Nesta, Scaturro  MR#: [id number] BIRTHDATE: [birth year] , 36  yrs. old GENDER: Female ENDOSCOPIST: Lafayette Dragon, MD REFERRED VP:XTGG Avel Sensor, M.D. , Dr Cruzita Lederer PROCEDURE DATE:  03/29/2013 PROCEDURE:   Colonoscopy with biopsy First Screening Colonoscopy - Avg.  risk and is 50 yrs.  old or older - No.  Prior Negative Screening - Now for repeat screening. N/A  History of Adenoma - Now for follow-up colonoscopy & has been > or = to 3 yrs.  N/A  Polyps Removed Today? No.  Recommend repeat exam, <10 yrs? Yes.  High risk (family or personal hx). ASA CLASS:   Class II INDICATIONS:ulcerative colitis overfill E.  duration.  Last colonoscopy in 2001 showed active colitis in the left colon.Marland Kitchen MEDICATIONS: MAC sedation, administered by CRNA and Propofol (Diprivan) 230 mg IV  DESCRIPTION OF PROCEDURE:   After the risks benefits and alternatives of the procedure were thoroughly explained, informed consent was obtained.  A digital rectal exam revealed no abnormalities of the rectum.   The LB [id number]  endoscope was introduced through the anus and advanced to the cecum, which was identified by both the appendix and ileocecal valve. No adverse events experienced.   The quality of the prep was Prepopik good The instrument was then slowly withdrawn as the colon was fully examined.      COLON FINDINGS: the rectum and rectosigmoid colon mucosa appeared entirely normal. Ring at 30 cm from the rectum mucosa appeared friable granuloma and patchy Abnormal mucosa was found.throughout the sigmoid and descending colon it became more severe at the splenic flexure and in vault transverse colon to hepatic flexure. The most severe changes were noted in the hepatic flexure. There was no spontaneous bleeding but there was edema and loss of submucosal blood vessel pattern. Right colon showed  patches of erythema and friability. Cecal pouch appeared normal. Multiple random biopsies were taken from the cecum and right colon, from the hepatic flexure, from the splenic flexure, from sigmoid colon between 30 and 50 cm and from rectosigmoid colon  Retroflexed views revealed no abnormalities. The time to cecum=2 minutes 33 seconds. Withdrawal time=9 minutes 20 seconds.  The scope was withdrawn and the procedure completed. COMPLICATIONS: There were no complications.  ENDOSCOPIC IMPRESSION: ulcerative colitis, rectal sparing. Mild colitis in the left colon and moderate to severe colitis in transverse colons , most severe in  hepatic flexure. Cecal sparing. Multiple random biopsies obtained to rule out dysplasia  RECOMMENDATIONS: 1.  Await pathology results 2.   start mesalamine 3.6 g daily 3.  Office visit to discuss further therapy since patient has multiple allergies one of them to prednisone Recall colonoscopy in 3 years since disease has been present for over 30 years   eSigned:  Lafayette Dragon, MD 03/29/2013 10:03 AM   cc:   PATIENT NAME:  Mone, Commisso MR#: [id number]

## 2013-03-29 NOTE — Telephone Encounter (Signed)
Pt looking up medicines ( entecort )  side effects. 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 Pt states with sulfasalazine she had diarrhea and with mesalamine she thought she had hair loss which continues now she doesnt think that she actually was a side effect.  Pt is questioning if the sulfasalazine or the mesalamine would be a better choice for her. She is concerned about the decreased immune system with the entocort and the other side effects of irregular heart beat and she has irregular heart beat and its an extended release and she is concerned  with this because if she had a reaction it would stay longer in her system and she doesn't like extended release medicines.  With mesalamine before it was an enema not oral medication. Pt has not picked up her entocort at the pharmacy yet. Pt did question if the sulfasalazine or mesalamine has a steroid in it because she doesn't do well with steroids it makes her feel bad all over and makes heart skip beats. She states the entocort does have a steroid. Pt has multiple concerns and states she wants dr Olevia Perches to look and see which would be the best medicine choice for her, that has the least amounts of side effects but the best choice to treat her colitis.  With the sulfasalazine there is a delayed release tablet  and she questions if this would be the best choice.   Also she states she has some bleeding since home, is bright red but decreasing since left here.  Instructed she had lots of biopsies done and active colitis so bleeding not unusual. Instructed to monitor bleeding and let me know if increasing amounts vs decreasing amounts. Instructed not to strain and monitor bleeding. Pt denies pain at this time.

## 2013-03-29 NOTE — Progress Notes (Signed)
Called to room to assist during endoscopic procedure.  Patient ID and intended procedure confirmed with present staff. Received instructions for my participation in the procedure from the performing physician. ewm 

## 2013-03-31 LAB — METANEPHRINES, PLASMA
Normetanephrine, Free: 48 pg/mL (ref <=148)
Total Metanephrines-Plasma: 48 pg/mL (ref <=205)

## 2013-04-01 ENCOUNTER — Telehealth: Payer: Self-pay | Admitting: *Deleted

## 2013-04-01 ENCOUNTER — Telehealth: Payer: Self-pay | Admitting: Internal Medicine

## 2013-04-01 MED ORDER — FOLIC ACID 1 MG PO TABS: 1.0000 mg | ORAL_TABLET | Freq: Every day | ORAL | Status: DC

## 2013-04-01 MED ORDER — SULFASALAZINE 500 MG PO TABS: 1000.0000 mg | ORAL_TABLET | Freq: Two times a day (BID) | ORAL | Status: DC

## 2013-04-01 NOTE — Telephone Encounter (Signed)
Patient is calling because Dr. Cruzita Lederer told her if Dr. Olevia Perches wants to have the renal cyst looked at when she has her MRI for adrenal glands, she can call Dr. Cruzita Lederer and they will add this to the MRI.( Patient states she is due for [place] to evaluate renal cyst in March) Please, advise.

## 2013-04-01 NOTE — Telephone Encounter (Signed)
Pt notified of order from dr Olevia Perches for the sulfasalazine with it being not her 1st or 2nd choice but will try it. Also instructed of the folic acid 1 mg daily . Both e-prescribed to [facility] per pt request. Pt reminded to follow up with dr Olevia Perches as instructed at procedure last week.  Pt states no bleeding since after procedure Friday. She states she has done well over the weekend. ewm

## 2013-04-01 NOTE — Telephone Encounter (Signed)
  Follow up Call-  Call back number 03/29/2013  Post procedure Call Back phone  # [phone]  Permission to leave phone message Yes     Patient questions:  Do you have a fever, pain , or abdominal swelling? no Pain Score  0 *  Have you tolerated food without any problems? yes  Have you been able to return to your normal activities? yes  Do you have any questions about your discharge instructions: Diet   no Medications  no Follow up visit  no  Do you have questions or concerns about your Care? no  Actions: * If pain score is 4 or above: No action needed, pain <4.

## 2013-04-01 NOTE — Telephone Encounter (Signed)
Dr Cruzita Lederer has this under control.

## 2013-04-02 ENCOUNTER — Ambulatory Visit: Payer: 59 | Admitting: Internal Medicine

## 2013-04-02 NOTE — Telephone Encounter (Signed)
Patient aware.

## 2013-04-05 ENCOUNTER — Encounter: Payer: Self-pay | Admitting: Internal Medicine

## 2013-04-07 ENCOUNTER — Encounter: Payer: Self-pay | Admitting: Internal Medicine

## 2013-04-08 ENCOUNTER — Telehealth: Payer: Self-pay | Admitting: *Deleted

## 2013-04-08 NOTE — Telephone Encounter (Signed)
Call and gave patient results of biopsy as per letter on 04/07/13.

## 2013-04-09 ENCOUNTER — Other Ambulatory Visit (INDEPENDENT_AMBULATORY_CARE_PROVIDER_SITE_OTHER): Payer: 59

## 2013-04-09 DIAGNOSIS — E278 Other specified disorders of adrenal gland: Secondary | ICD-10-CM

## 2013-04-11 ENCOUNTER — Encounter: Payer: Self-pay | Admitting: Internal Medicine

## 2013-04-11 ENCOUNTER — Telehealth: Payer: Self-pay | Admitting: Internal Medicine

## 2013-04-11 NOTE — Telephone Encounter (Signed)
Sulfasalazine does not cause diarrhea, The colitis is causing her diarrhea. Sulfasalazine does not turn things aroung right away, it will take weeks for the colitis to get better. She ought to take the Sulfasalazine.

## 2013-04-11 NOTE — Telephone Encounter (Signed)
Patient states she took Sulfasalazine for first time yesterday. She took it with food. She had diarrhea yesterday x 4.Stomach was rolling too. This morning, she had urgency and loose stool with bright red blood in toilet and on tissue. She reports she had some diarrhea after colonoscopy but her stools were more formed over the weekend until episode yesterday she was improving. She is suppose to go out of town this weekend and is concerned about taking Sulfasalazine. Please, advise.

## 2013-04-11 NOTE — Telephone Encounter (Signed)
Spoke with patient and gave her Dr. Nichola Sizer recommendation.

## 2013-04-12 ENCOUNTER — Telehealth: Payer: Self-pay | Admitting: *Deleted

## 2013-04-12 MED ORDER — MESALAMINE ER 0.375 G PO CP24: ORAL_CAPSULE | ORAL | Status: DC

## 2013-04-12 NOTE — Telephone Encounter (Signed)
OK to order Apriso 375 mg, 3 po bid or 6 at one time( 1.5 gm), #180, 6 refills ----- Message ----- From: [name] Sent: 04/12/2013 8:07 AM To: Lafayette Dragon, MD Subject: Non-Urgent Medical Question  Spoke with patient and told her I have ordered Apriso at her pharmacy. She states her allergy is hair loss. She wants to try it anyway.

## 2013-04-12 NOTE — Telephone Encounter (Signed)
rx sent to pharmacy.patient aware.

## 2013-04-14 ENCOUNTER — Encounter: Payer: Self-pay | Admitting: Internal Medicine

## 2013-04-15 ENCOUNTER — Inpatient Hospital Stay: Admission: RE | Admit: 2013-04-15 | Payer: 59 | Source: Ambulatory Visit

## 2013-04-16 ENCOUNTER — Ambulatory Visit (INDEPENDENT_AMBULATORY_CARE_PROVIDER_SITE_OTHER): Payer: 59 | Admitting: Internal Medicine

## 2013-04-16 ENCOUNTER — Encounter: Payer: Self-pay | Admitting: Internal Medicine

## 2013-04-16 VITALS — BP 122/80 | HR 72 | Ht 64.57 in | Wt 273.4 lb

## 2013-04-16 DIAGNOSIS — K5190 Ulcerative colitis, unspecified, without complications: Secondary | ICD-10-CM

## 2013-04-16 NOTE — Patient Instructions (Addendum)
If you are unable to take mesalamine, I would consider using immunomodulator's such as mercaptopurine or azathioprine.   Dr Cruzita Lederer, Dr Alain Marion

## 2013-04-16 NOTE — Progress Notes (Signed)
Cassie [name] 02/15/1961 [id number]   History of Present Illness:  This is a 52 year old white female with predominant left sided ulcerative colitis of greater than 30 years duration initially diagnosed in [place]. She just had a screening colonoscopy which showed active colitis in the left and transverse colon. Biopsies showed chronic minimal inactive colitis in the hepatic flexure with moderately active colitis in the splenic fracture and a normal colon from the rectosigmoid colon 0-20 cm. She experienced hair loss and diarrhea with sulfasalazine so she stopped it after 2 days. and has been afraid to start mesalamine because of possible side effects.She could not afford Lialda. She is currently asymptomatic except for occasional left lower quadrant abdominal discomfort. Her colitis went untreated for 13 years since her last colonoscopy in 2001. She has refused taking steroids. She is being evaluated for bilateral adrenal adenomas by Dr.Gherghe.   Past Medical History  Diagnosis Date  . Anxiety   . Panic disorder   . Other allergy, other than to medicinal agents   . Ulcerative colitis, unspecified   . Morbid obesity   . Vitamin B12 deficiency   . Vitamin D deficiency   . Cardiac arrhythmia     palpitations,PVC'sAC  . Chronic kidney disease     cyst right kidney  . Headache(784.0)     migraines occ.sinus,tension per patient  . Arthritis     knees,thumbs  . Anemia     20 yrs. ago not now  . Adrenal nodule     Past Surgical History  Procedure Laterality Date  . Breast biopsy  11/2011    Left- benign  . Colonoscopy    . Sigmoidoscopy      Allergies  Allergen Reactions  . Sudafed [Pseudoephedrine Hcl] Shortness Of Breath  . Diphenhydramine Hcl Swelling    REACTION: unspecified  . Mesalamine     REACTION: unspecified. Does not remember taking. Hair loss-thinks this was in an enema in 2004  . Sulfasalazine Diarrhea    REACTION: unspecified  . Caffeine Palpitations  .  Penicillins Rash    REACTION: unspecified  . Prednisone Palpitations    REACTION: unspecified    Review of Systems: Left lower quadrant and upper quadrant discomfort. Denies diarrhea or rectal bleeding  The remainder of the 10 point ROS is negative except as outlined in the H&P  Physical Exam: General Appearance Well developed, in no distress, overweight Eyes  Non icteric  HEENT  Non traumatic, normocephalic  Mouth No lesion, tongue papillated, no cheilosis Neck Supple without adenopathy, thyroid not enlarged, no carotid bruits, no JVD Lungs Clear to auscultation bilaterally COR Normal S1, normal S2, regular rhythm, no murmur, quiet precordium Abdomen Soft,  normoactive bowel sounds minimal tenderness left lower quadrant Rectal not done Extremities  No pedal edema Skin No lesions Neurological Alert and oriented x 3 Psychological Normal mood and affect  Assessment and Plan:   Problem #1 Ulcerative colitis. This has not been treated for many years due to patient having multiple allergies and personal reservations about each medication. We have discussed options. She does not want to take steroids or Entocort. She should try Apriso 0.375 g and build up from one tablet a day to four a day. If not tolerated well, we will consider immunomodulators such as 6-MP or Imuran. I have also mentioned biologicals such as Remicade or Humira, but she is reluctant to consider that at this time. We will see her in 3 months.I have discussed the increased incidence of colon cancer after  30 years of ulcerative colitis especially untreated. Total colectomy may be an option although she does not consider that at this time she will need recall colonoscopy in 2 years to look for dysplasia Problem #2 morbid obesity. She has intentionally lost 50 pounds    Delfin Edis 04/16/2013

## 2013-05-06 ENCOUNTER — Inpatient Hospital Stay: Admission: RE | Admit: 2013-05-06 | Payer: 59 | Source: Ambulatory Visit

## 2013-05-09 ENCOUNTER — Encounter: Payer: Self-pay | Admitting: Internal Medicine

## 2013-05-13 ENCOUNTER — Ambulatory Visit: Payer: 59 | Admitting: Internal Medicine

## 2013-05-15 ENCOUNTER — Encounter: Payer: Self-pay | Admitting: Internal Medicine

## 2013-05-15 ENCOUNTER — Telehealth: Payer: Self-pay | Admitting: Cardiovascular Disease

## 2013-05-15 NOTE — Telephone Encounter (Signed)
New Problem:  Pt is c/o being light headed for the past couple of weeks. Pt states her heart rate has been running around 48-52 beats per min resting.... And it was 56 with activity... Pt states she took it earlier today and it was 73.   Pt also states since last summer she has lost 60 pounds...    Pt states she was taking 75 mg atenolol.. Then cut back to 87m and then to 50 mg.. Pt has questions about the med changes and her heart rate..Marland KitchenMarland Kitchen

## 2013-05-15 NOTE — Telephone Encounter (Signed)
Decrease atenolol to 25 am and 25 pm  Hold PM dose if HR less then 60

## 2013-05-15 NOTE — Telephone Encounter (Signed)
I spoke with pt & she states her heart rate has been running low 48-52 for the past couple of days. She reports some lightheadedness progressively worsening in the evening.  Heart rate at night in the 50's.  "if my heart rate is in the low 50's that is when I feel worse".  Palpitations better this past year per pt.  She was taking Atenolol 55m in the am & 526mat bedtime. She reduced this to 5066mn the am & pm a couple of days ago.  Pt would like to know what she should do about dose of Atenolol.  She is having a MRI/MRA tomorrow to follow-up on Adrenal nodules.  Recommended she take 1/2 the night time dose of Atenolol (41m56m possibly hold if heart rate under 55  Will forward to Dr. NishJohnsie Cancel review.  DebbHorton Chin    She would like Dr. NishJohnsie Cancelknow she has lost 60 pounds since the summer, eating more healthy. Also, she missed her 2014 appointment b/c she states she did not receive a reminder to make an appt.  Pt has appt this month with Dr. NishJohnsie Cancel

## 2013-05-16 ENCOUNTER — Other Ambulatory Visit: Payer: 59

## 2013-05-16 NOTE — Telephone Encounter (Signed)
PT  NOTIFIED./CY

## 2013-05-22 ENCOUNTER — Encounter: Payer: Self-pay | Admitting: Internal Medicine

## 2013-05-24 ENCOUNTER — Other Ambulatory Visit: Payer: 59

## 2013-05-24 ENCOUNTER — Other Ambulatory Visit: Payer: Self-pay | Admitting: Internal Medicine

## 2013-05-27 ENCOUNTER — Ambulatory Visit
Admission: RE | Admit: 2013-05-27 | Discharge: 2013-05-27 | Disposition: A | Discharge status: Home / Self Care | Payer: 59 | Source: Ambulatory Visit | Attending: Internal Medicine | Admitting: Internal Medicine

## 2013-05-27 DIAGNOSIS — E278 Other specified disorders of adrenal gland: Secondary | ICD-10-CM

## 2013-05-27 MED ORDER — GADOBENATE DIMEGLUMINE 529 MG/ML IV SOLN
20.0000 mL | Freq: Once | INTRAVENOUS | Status: AC | PRN
  Administered 2013-05-27: 20 mL via INTRAVENOUS

## 2013-05-28 ENCOUNTER — Encounter: Payer: Self-pay | Admitting: Internal Medicine

## 2013-05-31 ENCOUNTER — Encounter: Payer: Self-pay | Admitting: Cardiovascular Disease

## 2013-05-31 ENCOUNTER — Ambulatory Visit (INDEPENDENT_AMBULATORY_CARE_PROVIDER_SITE_OTHER): Payer: 59 | Admitting: Cardiovascular Disease

## 2013-05-31 VITALS — BP 126/72 | HR 65 | Ht 65.0 in | Wt 263.0 lb

## 2013-05-31 DIAGNOSIS — R002 Palpitations: Secondary | ICD-10-CM

## 2013-05-31 MED ORDER — ATENOLOL 25 MG PO TABS: 25.0000 mg | ORAL_TABLET | Freq: Every day | ORAL | Status: DC

## 2013-05-31 NOTE — Progress Notes (Signed)
Patient ID: Cassie [name], female   DOB: 08/08/1960, 53 y.o.   MRN: [id number] Anxious 53 yo f/u chronic palpitations and some PVC;s seen on event monitor.  No CAD and normal EF by echo Has been seen by EP who suggested only beta blockers And no antiarrhythmic Rx.  Has lost 30-40 lbs since I last saw her and has had atenolol dose lowered   She continues to have some anxiety.  Issues with possible breast CA and adrenal abnormality both resolved. No palpitations.      ROS: Denies fever, malais, weight loss, blurry vision, decreased visual acuity, cough, sputum, SOB, hemoptysis, pleuritic pain, palpitaitons, heartburn, abdominal pain, melena, lower extremity edema, claudication, or rash.  All other systems reviewed and negative  General: Affect appropriate Anxious obes white female  HEENT: normal Neck supple with no adenopathy JVP normal no bruits no thyromegaly Lungs clear with no wheezing and good diaphragmatic motion Heart:  S1/S2 no murmur, no rub, gallop or click PMI normal Abdomen: benighn, BS positve, no tenderness, no AAA no bruit.  No HSM or HJR Distal pulses intact with no bruits No edema Neuro non-focal Skin warm and dry No muscular weakness   Current Outpatient Prescriptions  Medication Sig Dispense Refill  . ALPRAZolam (XANAX) 1 MG tablet Take half tab by mouth in am and one tab at bedtime      . atenolol (TENORMIN) 50 MG tablet TAKE ONE AND ONE-HALF TABLETS BY MOUTH IN THE MORNING AND ONE IN THE P.M.      . cholecalciferol (VITAMIN D) 1000 UNITS tablet Take 2,000 Units by mouth daily.       . Cyanocobalamin (VITAMIN B-12) 1000 MCG SUBL Place 1 tablet (1,000 mcg total) under the tongue daily.  500 tablet  3  . folic acid (FOLVITE) 1 MG tablet Take 1 tablet (1 mg total) by mouth daily.  100 tablet  0  . norethindrone-ethinyl estradiol (FEMHRT LOW DOSE) 0.5-2.5 MG-MCG per tablet Take 1 tablet by mouth daily.  28 tablet  0  . Probiotic Product (PROBIOTIC DAILY PO) Take 2  capsules by mouth daily.       Marland Kitchen UNABLE TO FIND Please dispense one large wheelchair. Dx: 719.46  1 each  0  . Cranberry 400 MG CAPS Take 400 mg by mouth daily. prn      . ibuprofen (ADVIL,MOTRIN) 600 MG tablet Take 1 tablet (600 mg total) by mouth 2 (two) times daily as needed for pain.  60 tablet  1   No current facility-administered medications for this visit.    Allergies  Sudafed; Diphenhydramine hcl; Mesalamine; Sulfasalazine; Caffeine; Penicillins; and Prednisone  Electrocardiogram:  SR rate 65 Normal ECG   Assessment and Plan

## 2013-05-31 NOTE — Assessment & Plan Note (Signed)
Improved with 30 lb weight loss since I saw her in 2013  Discussed low carb diet exercise and portion control Her and husband are doing well

## 2013-05-31 NOTE — Patient Instructions (Signed)
Your physician wants you to follow-up in:  [place] will receive a reminder letter in the mail two months in advance. If you don't receive a letter, please call our office to schedule the follow-up appointment. Your physician has recommended you make the following change in your medication: DECREASE  ATENOLOL TO   25 MG  EVERY DAY

## 2013-05-31 NOTE — Assessment & Plan Note (Signed)
Benign PVCls improved continue lower dose beta blocker

## 2013-06-03 ENCOUNTER — Encounter: Payer: Self-pay | Admitting: Internal Medicine

## 2013-06-03 ENCOUNTER — Telehealth: Payer: Self-pay | Admitting: Internal Medicine

## 2013-06-03 DIAGNOSIS — K8020 Calculus of gallbladder without cholecystitis without obstruction: Secondary | ICD-10-CM

## 2013-06-03 NOTE — Telephone Encounter (Signed)
Patient is asking if Dr Olevia Perches has reviewed the MRI report. Patient states that she is concerned because it says she has gallstones. Please, advise.

## 2013-06-03 NOTE — Telephone Encounter (Signed)
Spoke with patient and she would like to have the ultrasound. Spoke with Alisha and scheduled on 06/06/13 at 8:15/8:30 AM. NPO after midnight. Patient notified.

## 2013-06-03 NOTE — Telephone Encounter (Signed)
Yes, MRI shows multiple gall stones. Her last sono of the gall bladder in 2010 was normal .MRI is not the most reliable test to look for gall stones. If she is concerned then we need to obtain upper abdominal ultrasound.

## 2013-06-04 ENCOUNTER — Encounter: Payer: Self-pay | Admitting: Cardiovascular Disease

## 2013-06-04 NOTE — Telephone Encounter (Signed)
Cassie [name], please see email from patient, looking at Dr Johnsie Cancel note I dont see instructions to take any extra atenolol, please research for me, thanks, Cassie [name]

## 2013-06-05 ENCOUNTER — Other Ambulatory Visit: Payer: Self-pay | Admitting: *Deleted

## 2013-06-05 MED ORDER — ATENOLOL 25 MG PO TABS: 25.0000 mg | ORAL_TABLET | Freq: Every day | ORAL | Status: DC

## 2013-06-06 ENCOUNTER — Ambulatory Visit (HOSPITAL_COMMUNITY)
Admission: RE | Admit: 2013-06-06 | Discharge: 2013-06-06 | Disposition: A | Discharge status: Home / Self Care | Payer: 59 | Source: Ambulatory Visit | Attending: Internal Medicine | Admitting: Internal Medicine

## 2013-06-06 ENCOUNTER — Telehealth: Payer: Self-pay | Admitting: Internal Medicine

## 2013-06-06 DIAGNOSIS — N281 Cyst of kidney, acquired: Secondary | ICD-10-CM | POA: Insufficient documentation

## 2013-06-06 DIAGNOSIS — K8020 Calculus of gallbladder without cholecystitis without obstruction: Secondary | ICD-10-CM

## 2013-06-06 DIAGNOSIS — R109 Unspecified abdominal pain: Secondary | ICD-10-CM | POA: Insufficient documentation

## 2013-06-06 NOTE — Telephone Encounter (Signed)
Patient given ultrasound results.

## 2013-06-14 ENCOUNTER — Encounter: Payer: Self-pay | Admitting: *Deleted

## 2013-06-18 ENCOUNTER — Other Ambulatory Visit: Payer: Self-pay | Admitting: Internal Medicine

## 2013-06-18 NOTE — Telephone Encounter (Signed)
Refill request for ibuprofen Last filled by MD on - 11/08/12 #60 x1 Last Appt: 03/23/13 Next Appt: 07/17/13 Please advise refill?

## 2013-06-21 ENCOUNTER — Other Ambulatory Visit: Payer: Self-pay | Admitting: *Deleted

## 2013-06-21 MED ORDER — IBUPROFEN 600 MG PO TABS: 600.0000 mg | ORAL_TABLET | Freq: Two times a day (BID) | ORAL | Status: DC | PRN

## 2013-07-03 ENCOUNTER — Encounter: Payer: Self-pay | Admitting: Internal Medicine

## 2013-07-03 ENCOUNTER — Other Ambulatory Visit: Payer: Self-pay | Admitting: Internal Medicine

## 2013-07-03 MED ORDER — DOXYCYCLINE HYCLATE 100 MG PO TABS: 100.0000 mg | ORAL_TABLET | Freq: Two times a day (BID) | ORAL | Status: DC

## 2013-07-09 ENCOUNTER — Ambulatory Visit: Payer: 59 | Admitting: Internal Medicine

## 2013-07-17 ENCOUNTER — Ambulatory Visit (INDEPENDENT_AMBULATORY_CARE_PROVIDER_SITE_OTHER): Payer: 59 | Admitting: Internal Medicine

## 2013-07-17 ENCOUNTER — Other Ambulatory Visit (INDEPENDENT_AMBULATORY_CARE_PROVIDER_SITE_OTHER): Payer: 59

## 2013-07-17 ENCOUNTER — Encounter: Payer: Self-pay | Admitting: Internal Medicine

## 2013-07-17 DIAGNOSIS — M25562 Pain in left knee: Secondary | ICD-10-CM | POA: Insufficient documentation

## 2013-07-17 DIAGNOSIS — F411 Generalized anxiety disorder: Secondary | ICD-10-CM

## 2013-07-17 DIAGNOSIS — R251 Tremor, unspecified: Secondary | ICD-10-CM

## 2013-07-17 DIAGNOSIS — K8020 Calculus of gallbladder without cholecystitis without obstruction: Secondary | ICD-10-CM | POA: Insufficient documentation

## 2013-07-17 DIAGNOSIS — M25569 Pain in unspecified knee: Secondary | ICD-10-CM

## 2013-07-17 DIAGNOSIS — R259 Unspecified abnormal involuntary movements: Secondary | ICD-10-CM

## 2013-07-17 DIAGNOSIS — E538 Deficiency of other specified B group vitamins: Secondary | ICD-10-CM

## 2013-07-17 LAB — CBC WITH DIFFERENTIAL/PLATELET
BASOS PCT: 0.3 % (ref 0.0–3.0)
Basophils Absolute: 0 10*3/uL (ref 0.0–0.1)
Eosinophils Absolute: 0.7 10*3/uL (ref 0.0–0.7)
Eosinophils Relative: 6.4 % — ABNORMAL HIGH (ref 0.0–5.0)
HCT: 41.4 % (ref 36.0–46.0)
Hemoglobin: 13.8 g/dL (ref 12.0–15.0)
LYMPHS PCT: 14.1 % (ref 12.0–46.0)
Lymphs Abs: 1.5 10*3/uL (ref 0.7–4.0)
MCHC: 33.3 g/dL (ref 30.0–36.0)
MCV: 94.2 fl (ref 78.0–100.0)
MONO ABS: 0.4 10*3/uL (ref 0.1–1.0)
Monocytes Relative: 4.1 % (ref 3.0–12.0)
NEUTROS ABS: 8 10*3/uL — AB (ref 1.4–7.7)
NEUTROS PCT: 75.1 % (ref 43.0–77.0)
Platelets: 286 10*3/uL (ref 150.0–400.0)
RBC: 4.4 Mil/uL (ref 3.87–5.11)
RDW: 13.9 % (ref 11.5–14.6)
WBC: 10.7 10*3/uL — ABNORMAL HIGH (ref 4.5–10.5)

## 2013-07-17 LAB — BASIC METABOLIC PANEL
BUN: 14 mg/dL (ref 6–23)
CHLORIDE: 108 meq/L (ref 96–112)
CO2: 26 mEq/L (ref 19–32)
CREATININE: 1 mg/dL (ref 0.4–1.2)
Calcium: 9.1 mg/dL (ref 8.4–10.5)
GFR: 65.08 mL/min (ref >60.00)
Glucose, Bld: 103 mg/dL — ABNORMAL HIGH (ref 70–99)
POTASSIUM: 4.1 meq/L (ref 3.5–5.1)
Sodium: 140 mEq/L (ref 135–145)

## 2013-07-17 LAB — HEPATIC FUNCTION PANEL
ALBUMIN: 3.4 g/dL — AB (ref 3.5–5.2)
ALT: 18 U/L (ref 0–35)
AST: 16 U/L (ref 0–37)
Alkaline Phosphatase: 63 U/L (ref 39–117)
Bilirubin, Direct: 0.1 mg/dL (ref 0.0–0.3)
TOTAL PROTEIN: 7.9 g/dL (ref 6.0–8.3)
Total Bilirubin: 0.3 mg/dL (ref 0.3–1.2)

## 2013-07-17 LAB — VITAMIN B12: Vitamin B-12: 775 pg/mL (ref 211–911)

## 2013-07-17 LAB — IBC PANEL
Iron: 72 ug/dL (ref 42–145)
Saturation Ratios: 24.5 % (ref 20.0–50.0)
TRANSFERRIN: 209.5 mg/dL — AB (ref 212.0–360.0)

## 2013-07-17 LAB — SEDIMENTATION RATE: SED RATE: 59 mm/h — AB (ref 0–22)

## 2013-07-17 LAB — TSH: TSH: 3.7 u[IU]/mL (ref 0.35–5.50)

## 2013-07-17 LAB — C-REACTIVE PROTEIN: CRP: 2.3 mg/dL (ref 0.5–20.0)

## 2013-07-17 NOTE — Assessment & Plan Note (Signed)
Continue with current prescription prn therapy as reflected on the Med list.

## 2013-07-17 NOTE — Assessment & Plan Note (Signed)
Continue with current prescription therapy as reflected on the Med list.  

## 2013-07-17 NOTE — Progress Notes (Signed)
Pre visit review using our clinic review tool, if applicable. No additional management support is needed unless otherwise documented below in the visit note. 

## 2013-07-17 NOTE — Progress Notes (Signed)
   Subjective:    Patient ID: Cassie [name], female    DOB: 10/04/1960, 53 y.o.   MRN: [id number]    C/o L inner knee pain 7/10 in intensity  x3-4 d in the back and outer side. C/o hand tremor, cough  Lost 40 lbs on diet  The patient presents for a follow-up of chronic palpitations - better, anxiety, B12 and vit D def controlled with medicines  F/u swelling on the back of her neck...  Wt Readings from Last 3 Encounters:  07/17/13 261 lb (118.389 kg)  05/31/13 263 lb (119.296 kg)  04/16/13 273 lb 6 oz (124.002 kg)        Review of Systems  Constitutional: Negative for chills, activity change, appetite change, fatigue and unexpected weight change.  HENT: Negative for congestion, mouth sores and sinus pressure.   Eyes: Negative for visual disturbance.  Respiratory: Negative for cough and chest tightness.   Cardiovascular: Positive for palpitations.  Gastrointestinal: Negative for nausea and abdominal pain.  Genitourinary: Negative for frequency, difficulty urinating and vaginal pain.  Musculoskeletal: Negative for back pain and gait problem.  Skin: Negative for pallor and rash. Post neck sq swelling Neurological: Positive for dizziness and light-headedness. Negative for tremors, weakness, numbness and headaches.  Psychiatric/Behavioral: Negative for confusion and sleep disturbance.  Fatty tumor LUQ - tender     Objective:   Physical Exam  Constitutional: She appears well-developed. No distress.       Obese  HENT:  Head: Normocephalic.  Right Ear: External ear normal.  Left Ear: External ear normal.  Nose: Nose normal.  Mouth/Throat: Oropharynx is clear and moist.  Eyes: Conjunctivae normal are normal. Pupils are equal, round, and reactive to light. Right eye exhibits no discharge. Left eye exhibits no discharge.  Neck: Normal range of motion. Neck supple. No JVD present. No tracheal deviation present. No thyromegaly present.  Cardiovascular: Normal rate, regular  rhythm and normal heart sounds.   Pulmonary/Chest: No stridor. No respiratory distress. She has no wheezes.  Abdominal: Soft. Bowel sounds are normal. She exhibits no distension and no mass. There is no tenderness. There is no rebound and no guarding.  Musculoskeletal: She exhibits no edema and no tenderness.  Lymphadenopathy:    She has no cervical adenopathy.  Neurological: She displays normal reflexes. No cranial nerve deficit. She exhibits normal muscle tone. Coordination normal.  Skin: No rash noted. No erythema.  Psychiatric: She has a normal mood and affect. Her behavior is normal. Judgment and thought content normal.  R posterolat knee is tender; no swelling Mild hand tremor R,L R post neck tender small LNs <1cm x4-5  Lab Results  Component Value Date   WBC 8.9 01/27/2012   HGB 13.1 01/27/2012   HCT 40.2 01/27/2012   PLT 265.0 01/27/2012   GLUCOSE 104* 01/27/2012   CHOL 177 01/27/2012   TRIG 83.0 01/27/2012   HDL 34.10* 01/27/2012   LDLCALC 126* 01/27/2012   ALT 19 01/27/2012   AST 18 01/27/2012   NA 142 01/27/2012   K 4.6 01/27/2012   CL 109 01/27/2012   CREATININE 0.9 01/27/2012   BUN 11 01/27/2012   CO2 25 01/27/2012   TSH 3.44 01/27/2012   HGBA1C 6.0 09/14/2006         Assessment & Plan:

## 2013-07-17 NOTE — Assessment & Plan Note (Signed)
2014 [place] Dr Olevia Perches

## 2013-07-17 NOTE — Assessment & Plan Note (Addendum)
Wt Readings from Last 3 Encounters:  07/17/13 261 lb (118.389 kg)  05/31/13 263 lb (119.296 kg)  04/16/13 273 lb 6 oz (124.002 kg)   Chronic - 3/16 lost 30-40 lbs

## 2013-07-19 ENCOUNTER — Encounter: Payer: Self-pay | Admitting: Internal Medicine

## 2013-07-20 ENCOUNTER — Encounter: Payer: Self-pay | Admitting: Internal Medicine

## 2013-07-22 ENCOUNTER — Other Ambulatory Visit: Payer: Self-pay | Admitting: Internal Medicine

## 2013-07-22 DIAGNOSIS — R591 Generalized enlarged lymph nodes: Secondary | ICD-10-CM

## 2013-07-22 DIAGNOSIS — R599 Enlarged lymph nodes, unspecified: Secondary | ICD-10-CM

## 2013-07-29 ENCOUNTER — Other Ambulatory Visit: Payer: Self-pay | Admitting: Internal Medicine

## 2013-07-29 DIAGNOSIS — R590 Localized enlarged lymph nodes: Secondary | ICD-10-CM

## 2013-07-31 ENCOUNTER — Other Ambulatory Visit: Payer: Self-pay | Admitting: Internal Medicine

## 2013-07-31 DIAGNOSIS — R221 Localized swelling, mass and lump, neck: Secondary | ICD-10-CM

## 2013-08-01 ENCOUNTER — Other Ambulatory Visit: Payer: Self-pay | Admitting: Internal Medicine

## 2013-08-01 DIAGNOSIS — R221 Localized swelling, mass and lump, neck: Secondary | ICD-10-CM

## 2013-08-05 ENCOUNTER — Encounter: Payer: Self-pay | Admitting: Internal Medicine

## 2013-08-07 ENCOUNTER — Telehealth: Payer: Self-pay | Admitting: Cardiovascular Disease

## 2013-08-07 ENCOUNTER — Ambulatory Visit (INDEPENDENT_AMBULATORY_CARE_PROVIDER_SITE_OTHER): Payer: 59 | Admitting: Internal Medicine

## 2013-08-07 ENCOUNTER — Telehealth: Payer: Self-pay | Admitting: Internal Medicine

## 2013-08-07 ENCOUNTER — Encounter: Payer: Self-pay | Admitting: Internal Medicine

## 2013-08-07 VITALS — BP 90/60 | HR 64 | Ht 65.0 in | Wt 260.6 lb

## 2013-08-07 DIAGNOSIS — K8020 Calculus of gallbladder without cholecystitis without obstruction: Secondary | ICD-10-CM

## 2013-08-07 NOTE — Telephone Encounter (Signed)
lmtcb ./cy 

## 2013-08-07 NOTE — Patient Instructions (Signed)
CC: Dr Alain Marion

## 2013-08-07 NOTE — Progress Notes (Signed)
KIMALA HORNE [birth year] [id number]  Note: This dictation was prepared with Dragon digital system. Any transcriptional errors that result from this procedure are unintentional.   History of Present Illness:  This is a 53 year old white female with asymptomatic cholelithiasis. Gallstones were found while she was evaluated for adrenal incidentoma by Dr Cruzita Lederer. She has history of ulcerative colitis of 30 years duration and on recent colonoscopy in November 2014 was found to have pancolitis with rectal sparing. There was no dysplasia. Her liver function tests have been normal. Her upper abdominal ultrasound showed 6 mm gallstones with a normal gallbladder wall and 3.6 mm common bile duct. Her sister had a complicated cholecystectomy last year preceded by ERCP for choledocholithiasis.    Past Medical History  Diagnosis Date  . Anxiety   . Panic disorder   . Other allergy, other than to medicinal agents   . Ulcerative colitis, unspecified   . Morbid obesity   . Vitamin B12 deficiency   . Vitamin D deficiency   . Cardiac arrhythmia     palpitations,PVC'sAC  . Chronic kidney disease     cyst right kidney  . Headache(784.0)     migraines occ.sinus,tension per patient  . Arthritis     knees,thumbs  . Anemia     20 yrs. ago not now  . Adrenal nodule   . Gallstones     Past Surgical History  Procedure Laterality Date  . Breast biopsy  11/2011    Left- benign  . Colonoscopy    . Sigmoidoscopy      Allergies  Allergen Reactions  . Sudafed [Pseudoephedrine Hcl] Shortness Of Breath  . Diphenhydramine Hcl Swelling    REACTION: unspecified  . Mesalamine     REACTION: unspecified. Does not remember taking. Hair loss-thinks this was in an enema in 2004  . Sulfasalazine Diarrhea    REACTION: unspecified  . Caffeine Palpitations  . Penicillins Rash    REACTION: unspecified  . Prednisone Palpitations    REACTION: unspecified    Family history and social history have been  reviewed.  Review of Systems: Denies heartburn abdominal pain but has indigestion  The remainder of the 10 point ROS is negative except as outlined in the H&P  Physical Exam: General Appearance Well developed, in no distress overweight Eyes  Non icteric  HEENT  Non traumatic, normocephalic  Mouth No lesion, tongue papillated, no cheilosis Neck Supple without adenopathy, thyroid not enlarged, no carotid bruits, no JVD Lungs Clear to auscultation bilaterally COR Normal S1, normal S2, regular rhythm, no murmur, quiet precordium Abdomen soft nontender abdomen with normal active bowel sounds. No ascites. No palpable mass. Right upper quadrant nontender Rectal not repeated Extremities  No pedal edema Skin No lesions Neurological Alert and oriented x 3 Psychological Normal mood and affect  Assessment and Plan:   Problem #1 Asymptomatic cholelithiasis. She has a normal common bile duct and gallbladder wall. She has ulcerative colitis and some of her symptoms may overlap with biliary symptoms but she has never had an actual acute attack of biliary colic. We have discussed pro's and cons of a cholecystectomy. She decided that she wants to wait and see if she becomes more symptomatic. She will either come to discuss it again or she will call and request a referral to surgery. I instructed her in a low-fat diet. Problem #2 ulcerative colitis. Pan colitis by colonoscopy. Continue current medications Problem #3 morbid obesity. She has been on reduction diet    Delfin Edis 08/07/2013

## 2013-08-07 NOTE — Telephone Encounter (Signed)
Okay 

## 2013-08-07 NOTE — Telephone Encounter (Signed)
New message     Patient went to see GI doctor  this am blood pressure was taken 90/60  X 3 times  On the same machine & same arm . Patient stated She did questions the machine reading, ask if they could do it on a different machine . patient stated they took it on the same arm with the same machine.  Was told the machine was calibrated  every month.     When she got home both arm 128/70    Has questions regarding am medication .

## 2013-08-07 NOTE — Telephone Encounter (Signed)
Follow up    Returned Cassie [name]'s call.  Pt thinks the machine at the [facility] doctor was wrong.  Patients bp machine at home makes her blood pressure (126/72) normal.  She took her husband's bp and her sister's bp and theirs was normal also.  You do not have to call back unless you want to.

## 2013-08-12 ENCOUNTER — Encounter: Payer: Self-pay | Admitting: Internal Medicine

## 2013-08-12 ENCOUNTER — Other Ambulatory Visit: Payer: Self-pay | Admitting: Internal Medicine

## 2013-08-12 DIAGNOSIS — R590 Localized enlarged lymph nodes: Secondary | ICD-10-CM

## 2013-08-12 NOTE — Telephone Encounter (Signed)
PER PT   HAS TAKEN SEVERAL   B/P'S  AT  HOME  AND ARE  RUNNING  NORMAL INFORMED  MAY  HAVE  BEEN CUFF SIZE  AS  WELL ./CY

## 2013-08-14 ENCOUNTER — Encounter: Payer: Self-pay | Admitting: Cardiovascular Disease

## 2013-08-14 ENCOUNTER — Other Ambulatory Visit: Payer: 59

## 2013-08-15 ENCOUNTER — Encounter: Payer: Self-pay | Admitting: Internal Medicine

## 2013-08-15 ENCOUNTER — Telehealth: Payer: Self-pay

## 2013-08-15 NOTE — Telephone Encounter (Signed)
The patient called and is hoping to get clarification.  She states she has been sending [employer] back and forth to the physician, however, from the scheduling side of the chart, we are unable to see what those messages say.  She is distressed and is hoping to speak with the CMA as soon as possible to clarify what is going on.   Thanks!

## 2013-08-16 NOTE — Telephone Encounter (Signed)
MOBILE  NUMBER  VOICE MAIL NOT SET  UP AND  HOME NUMBER  BUSY  SIGNAL  WILL TRY AND  CALL PT LATER .Adonis Housekeeper

## 2013-08-19 NOTE — Telephone Encounter (Signed)
[facility] MESSAGE REPORT Message [[id number]]    From Cassie [name]   To Cassandria Anger, MD [P [phone]   Composed 08/15/2013 11:06 AM   For Delivery On 08/15/2013 11:06 AM   Subject RE: Non-Urgent Medical Question   Message Type Patient Medical Advice Request   Read Status Y   Message Body I want u as my PC.I just wanted to see Dr. Ernesto Rutherford about the sinus issues.I did not cancel the CT, I had 2 reschedule it 4 Thurs.22nd, because I am leaving town & couldn't do it yesterday.I have never questioned your knowledge with issues I have.I've always followed ur recommendations.I thought we had a good patient/doctor relationship.I do hope you will reconsider your decision.   ----- Message -----  From: Walker Kehr, MD  Sent: 08/12/2013 10:25 PM EDT  To: Cassie [name]  Subject: RE: Non-Urgent Medical Question   Dear [name],  I will refer you to Dr. Ernesto Rutherford, per your wish. I hope that he will provide you with the help you are looking for, and that from now on you will be communicating directly with him.  I wanted to mention that I and my staff spent a lot of time and effort arranging for your tests and approving your CT scan with your Health Insurance plan. In my opinion, this is the right course of action. However, since you have repeatedly questioned my judgment, I feel that I'm not able to meet your medical needs and I suggest that you change to a different primary care physician who would address your multiple medical concerns in a fashion satisfactory to you. It is in your best interests to work with a physician who you trust completely. Please inform my staff who your new primary care provider will be so that we could transfer your records to them.  Sincerely,  AP     ----- Message -----  From: Cassie [name]  Sent: 08/12/2013 11:07 AM EDT  To: Walker Kehr, MD  Subject: Non-Urgent Medical Question   I do not want to have test repeatedly done. I would rather talk to specialists in the  fields I require and have all tests taken care of at the same time. I do so appreciate your help with this matter. And, I hope that you will consider my request. And, that you understand that I trust you, but just don't want to have to get tested for all these thing popping up repeatedly. Thank You

## 2013-08-21 ENCOUNTER — Encounter: Payer: Self-pay | Admitting: Internal Medicine

## 2013-08-22 ENCOUNTER — Encounter: Payer: Self-pay | Admitting: Internal Medicine

## 2013-08-22 NOTE — Telephone Encounter (Signed)
Left mess for patient to call back.  

## 2013-08-28 ENCOUNTER — Other Ambulatory Visit: Payer: 59

## 2013-09-06 ENCOUNTER — Telehealth: Payer: Self-pay | Admitting: Cardiovascular Disease

## 2013-09-06 NOTE — Telephone Encounter (Signed)
Spoke with patient who states she woke up this morning and did normal ADLs, no strenuous activity, took Atenolol 25 mg and noticed that HR was in the 90's while standing.  After 2-3 min of sitting, HR was down to 60's.  Normal highest rate is upper 60's to 70.  Patient states she has been in touch with Dr. Johnsie Cancel about HR and BP fluctuations on Atenolol and dosage has been adjusted.  I reviewed patient's chart and verified that patient was advised that she can take Atenolol 25 mg am and 12.5 mg pm.  Patient states this dosage was working well until this week when she started feeling poorly.  On Wednesday, patient states she was more light-headed and did not feel like doing much.  Patient states she noted HR 50.  Patient states over the past few days her HR decreases in the evening and she has had more noticeable heart palpitations and flutters.  Patient states she is light-headed, not dizzy.  Patient states her BP has been well-controlled.  Patient would like an appointment with Dr. Johnsie Cancel since he has been aware of her problems with Atenolol and has been making adjustments.  I advised patient that Dr. Johnsie Cancel does not return to the office until next week.  I offered patient an appointment on his next office day, which patient accepted.  I advised patient that if she needs to be seen sooner, she will have to see another provider.  Patient prefers to wait for Dr. Johnsie Cancel.  I advised patient to call EMS for sustained HR above 115 or below 50, for change in HR and blood pressure, or for increasing symptoms.  Patient verbalized understanding and agreement.

## 2013-09-06 NOTE — Telephone Encounter (Signed)
New message     Still having problems with heart rate.  She does not feel good. She is lightheaded. Pt states that she would like to see Dr Johnsie Cancel soon but she also wanted to talk to the nurse

## 2013-09-11 NOTE — Telephone Encounter (Signed)
She can continue the lower dose of beta blocker She has anxiety and not all of her issues are related to her heart

## 2013-09-12 ENCOUNTER — Encounter: Payer: Self-pay | Admitting: Cardiovascular Disease

## 2013-09-12 ENCOUNTER — Ambulatory Visit (INDEPENDENT_AMBULATORY_CARE_PROVIDER_SITE_OTHER): Payer: 59 | Admitting: Cardiovascular Disease

## 2013-09-12 VITALS — BP 124/76 | HR 76 | Ht 65.0 in | Wt 257.0 lb

## 2013-09-12 DIAGNOSIS — R002 Palpitations: Secondary | ICD-10-CM

## 2013-09-12 DIAGNOSIS — I499 Cardiac arrhythmia, unspecified: Secondary | ICD-10-CM

## 2013-09-12 DIAGNOSIS — R42 Dizziness and giddiness: Secondary | ICD-10-CM

## 2013-09-12 NOTE — Patient Instructions (Signed)
Your physician wants you to follow-up in:   [place] will receive a reminder letter in the mail two months in advance. If you don't receive a letter, please call our office to schedule the follow-up appointment. Your physician recommends that you continue on your current medications as directed. Please refer to the Current Medication list given to you today.  Your physician has recommended that you wear an event monitor. Event monitors are medical devices that record the heart's electrical activity. Doctors most often [place] these monitors to diagnose arrhythmias. Arrhythmias are problems with the speed or rhythm of the heartbeat. The monitor is a small, portable device. You can wear one while you do your normal daily activities. This is usually used to diagnose what is causing palpitations/syncope (passing out).

## 2013-09-12 NOTE — Assessment & Plan Note (Signed)
Non cardiac related not postural today  F/U primary and ENT  Has isolated posterior cervical lymph note on right that is not signficant

## 2013-09-12 NOTE — Assessment & Plan Note (Signed)
Previous benign PVCls  In crease in palpitation F/U event monitor

## 2013-09-12 NOTE — Progress Notes (Signed)
Patient ID: Cassie [name], female   DOB: 31-Aug-1960, 53 y.o.   MRN: [id number] Anxious 53 yo f/u chronic palpitations and some PVC;s seen on event monitor. No CAD and normal EF by echo Has been seen by EP who suggested only beta blockers  And no antiarrhythmic Rx. Has lost 30-40 lbs since I last saw her and has had atenolol dose lowered She continues to have some anxiety. Issues with possible breast CA and adrenal abnormality both resolved.   Has myriad of complaints again Dyspnic.  Fatigue  Palpitations  Feels HR is too high but when she checks it is in 90's  No frank syncope  Was released from practice by Dr Laurian Brim       ROS: Denies fever, malais, weight loss, blurry vision, decreased visual acuity, cough, sputum, SOB, hemoptysis, pleuritic pain, palpitaitons, heartburn, abdominal pain, melena, lower extremity edema, claudication, or rash.  All other systems reviewed and negative  General: Affect appropriate Obese white female  HEENT: normal Neck supple with no adenopathy JVP normal no bruits no thyromegaly Lungs clear with no wheezing and good diaphragmatic motion Heart:  S1/S2 no murmur, no rub, gallop or click PMI normal Abdomen: benighn, BS positve, no tenderness, no AAA no bruit.  No HSM or HJR Distal pulses intact with no bruits No edema Neuro non-focal Skin warm and dry No muscular weakness   Current Outpatient Prescriptions  Medication Sig Dispense Refill  . ALPRAZolam (XANAX) 1 MG tablet Take half tab by mouth in am and one tab at bedtime      . atenolol (TENORMIN) 25 MG tablet Take 25 mg by mouth daily. MAY TAKE   IN PM  AS NEEDED FOR  PALPITATION. 35 mg am, 25 mg pm.      . cholecalciferol (VITAMIN D) 1000 UNITS tablet Take 2,000 Units by mouth daily.       . Cranberry 400 MG CAPS Take 400 mg by mouth daily. prn      . Cyanocobalamin (VITAMIN B-12) 1000 MCG SUBL Place 1 tablet (1,000 mcg total) under the tongue daily.  100 tablet  3  . ibuprofen (ADVIL,MOTRIN)  600 MG tablet Take 1 tablet (600 mg total) by mouth 2 (two) times daily as needed for headache.  60 tablet  0  . norethindrone-ethinyl estradiol (FEMHRT LOW DOSE) 0.5-2.5 MG-MCG per tablet Take 1 tablet by mouth daily.  28 tablet  0  . Probiotic Product (PROBIOTIC DAILY PO) Take 2 capsules by mouth daily.        No current facility-administered medications for this visit.    Allergies  Sudafed; Diphenhydramine hcl; Mesalamine; Sulfasalazine; Caffeine; Penicillins; and Prednisone  Electrocardiogram:  Assessment and Plan

## 2013-09-12 NOTE — Telephone Encounter (Signed)
IS  SEEING  DR NISHAN TODAY ./CY

## 2013-09-13 ENCOUNTER — Encounter: Payer: Self-pay | Admitting: Cardiovascular Disease

## 2013-09-16 ENCOUNTER — Encounter (INDEPENDENT_AMBULATORY_CARE_PROVIDER_SITE_OTHER): Payer: 59

## 2013-09-16 ENCOUNTER — Encounter: Payer: Self-pay | Admitting: *Deleted

## 2013-09-16 DIAGNOSIS — R002 Palpitations: Secondary | ICD-10-CM

## 2013-09-16 DIAGNOSIS — R42 Dizziness and giddiness: Secondary | ICD-10-CM

## 2013-09-16 NOTE — Progress Notes (Signed)
Patient ID: Cassie [name], female   DOB: 02/16/1961, 53 y.o.   MRN: [id number] E-Cardio verite 30 day cardiac event monitor applied to patient.

## 2013-10-25 ENCOUNTER — Telehealth: Payer: Self-pay | Admitting: *Deleted

## 2013-10-25 NOTE — Telephone Encounter (Signed)
PT  AWARE OF MONITOR  RESULTS PER  DR NISHAN  NSR ./CY

## 2013-11-12 ENCOUNTER — Encounter (HOSPITAL_COMMUNITY): Payer: Self-pay | Admitting: Emergency Medicine

## 2013-11-12 ENCOUNTER — Emergency Department (HOSPITAL_COMMUNITY)
Admission: EM | Admit: 2013-11-12 | Discharge: 2013-11-13 | Disposition: A | Discharge status: Home / Self Care | Payer: 59 | Attending: Emergency Medicine | Admitting: Emergency Medicine

## 2013-11-12 ENCOUNTER — Telehealth: Payer: Self-pay | Admitting: Internal Medicine

## 2013-11-12 DIAGNOSIS — Z880 Allergy status to penicillin: Secondary | ICD-10-CM | POA: Insufficient documentation

## 2013-11-12 DIAGNOSIS — Z8719 Personal history of other diseases of the digestive system: Secondary | ICD-10-CM | POA: Insufficient documentation

## 2013-11-12 DIAGNOSIS — G43909 Migraine, unspecified, not intractable, without status migrainosus: Secondary | ICD-10-CM | POA: Insufficient documentation

## 2013-11-12 DIAGNOSIS — M129 Arthropathy, unspecified: Secondary | ICD-10-CM | POA: Insufficient documentation

## 2013-11-12 DIAGNOSIS — Z9889 Other specified postprocedural states: Secondary | ICD-10-CM | POA: Insufficient documentation

## 2013-11-12 DIAGNOSIS — F410 Panic disorder [episodic paroxysmal anxiety] without agoraphobia: Secondary | ICD-10-CM | POA: Insufficient documentation

## 2013-11-12 DIAGNOSIS — Z87891 Personal history of nicotine dependence: Secondary | ICD-10-CM | POA: Insufficient documentation

## 2013-11-12 DIAGNOSIS — R1084 Generalized abdominal pain: Secondary | ICD-10-CM | POA: Insufficient documentation

## 2013-11-12 DIAGNOSIS — N189 Chronic kidney disease, unspecified: Secondary | ICD-10-CM | POA: Insufficient documentation

## 2013-11-12 DIAGNOSIS — E538 Deficiency of other specified B group vitamins: Secondary | ICD-10-CM | POA: Insufficient documentation

## 2013-11-12 DIAGNOSIS — R197 Diarrhea, unspecified: Secondary | ICD-10-CM | POA: Insufficient documentation

## 2013-11-12 DIAGNOSIS — R110 Nausea: Secondary | ICD-10-CM | POA: Insufficient documentation

## 2013-11-12 DIAGNOSIS — D649 Anemia, unspecified: Secondary | ICD-10-CM | POA: Insufficient documentation

## 2013-11-12 DIAGNOSIS — E559 Vitamin D deficiency, unspecified: Secondary | ICD-10-CM | POA: Insufficient documentation

## 2013-11-12 DIAGNOSIS — Z79899 Other long term (current) drug therapy: Secondary | ICD-10-CM | POA: Insufficient documentation

## 2013-11-12 NOTE — ED Notes (Signed)
Pt presents with c/o of bilateral quadrants abdominal pain, remarks on extensive GI hx, recently diagnosed with gall stones by GI-MD, reports constant nausea and diarrhea(x5 today). Denies fevers or chills, reports being lightheaded, in NAD.

## 2013-11-12 NOTE — Telephone Encounter (Signed)
Having upper abd pain and pain in back - was severe but has let up. Also with watery diarrhea.  Has UC and not on meds because of reactions. Says these sxs not like UC.  ? If its gallstones.  Has tried pepto-bismol and Imodium w/o much help.  No fever or vomiting.  She does not have signs of jaundice.  If worse again she is advised to go to ED.  Otherwise we will contact her early in AM for an update on sxs -  She is seeing Dr. Nancy Fetter of [facility] for first visit 345 PM tomorrow.  I told her we would call and see how she is - could need labs from [place] vs. Having Dr. Nancy Fetter see her and decide  - and we will get Dr. Nichola Sizer input tomorrow also.

## 2013-11-12 NOTE — Telephone Encounter (Signed)
Thank you, Glendell Docker.

## 2013-11-13 ENCOUNTER — Encounter (HOSPITAL_COMMUNITY): Payer: Self-pay

## 2013-11-13 ENCOUNTER — Emergency Department (HOSPITAL_COMMUNITY): Payer: 59

## 2013-11-13 LAB — URINALYSIS, ROUTINE W REFLEX MICROSCOPIC
BILIRUBIN URINE: NEGATIVE
Glucose, UA: NEGATIVE mg/dL
KETONES UR: NEGATIVE mg/dL
NITRITE: NEGATIVE
PROTEIN: NEGATIVE mg/dL
Specific Gravity, Urine: 1.011 (ref 1.005–1.030)
UROBILINOGEN UA: 0.2 mg/dL (ref 0.0–1.0)
pH: 5.5 (ref 5.0–8.0)

## 2013-11-13 LAB — CBC WITH DIFFERENTIAL/PLATELET
BASOS PCT: 0 % (ref 0–1)
Basophils Absolute: 0 10*3/uL (ref 0.0–0.1)
Eosinophils Absolute: 0.2 10*3/uL (ref 0.0–0.7)
Eosinophils Relative: 2 % (ref 0–5)
HEMATOCRIT: 44.4 % (ref 36.0–46.0)
HEMOGLOBIN: 15.1 g/dL — AB (ref 12.0–15.0)
LYMPHS ABS: 1.2 10*3/uL (ref 0.7–4.0)
Lymphocytes Relative: 10 % — ABNORMAL LOW (ref 12–46)
MCH: 31.5 pg (ref 26.0–34.0)
MCHC: 34 g/dL (ref 30.0–36.0)
MCV: 92.7 fL (ref 78.0–100.0)
MONO ABS: 0.4 10*3/uL (ref 0.1–1.0)
MONOS PCT: 4 % (ref 3–12)
NEUTROS PCT: 84 % — AB (ref 43–77)
Neutro Abs: 10.3 10*3/uL — ABNORMAL HIGH (ref 1.7–7.7)
Platelets: 286 10*3/uL (ref 150–400)
RBC: 4.79 MIL/uL (ref 3.87–5.11)
RDW: 12.9 % (ref 11.5–15.5)
WBC: 12.1 10*3/uL — ABNORMAL HIGH (ref 4.0–10.5)

## 2013-11-13 LAB — COMPREHENSIVE METABOLIC PANEL
ALBUMIN: 3.4 g/dL — AB (ref 3.5–5.2)
ALK PHOS: 87 U/L (ref 39–117)
ALT: 19 U/L (ref 0–35)
ANION GAP: 14 (ref 5–15)
AST: 16 U/L (ref 0–37)
BILIRUBIN TOTAL: 0.3 mg/dL (ref 0.3–1.2)
BUN: 10 mg/dL (ref 6–23)
CHLORIDE: 101 meq/L (ref 96–112)
CO2: 22 mEq/L (ref 19–32)
CREATININE: 0.96 mg/dL (ref 0.50–1.10)
Calcium: 9.7 mg/dL (ref 8.4–10.5)
GFR calc non Af Amer: 66 mL/min — ABNORMAL LOW (ref >90)
GFR, EST AFRICAN AMERICAN: 76 mL/min — AB (ref >90)
GLUCOSE: 123 mg/dL — AB (ref 70–99)
POTASSIUM: 3.8 meq/L (ref 3.7–5.3)
Sodium: 137 mEq/L (ref 137–147)
Total Protein: 8.3 g/dL (ref 6.0–8.3)

## 2013-11-13 LAB — LIPASE, BLOOD: LIPASE: 46 U/L (ref 11–59)

## 2013-11-13 LAB — URINE MICROSCOPIC-ADD ON

## 2013-11-13 MED ORDER — ONDANSETRON HCL 4 MG/2ML IJ SOLN
4.0000 mg | Freq: Once | INTRAMUSCULAR | Status: DC
  Filled 2013-11-13: qty 2

## 2013-11-13 MED ORDER — SODIUM CHLORIDE 0.9 % IV SOLN
Freq: Once | INTRAVENOUS | Status: AC
  Administered 2013-11-13: 01:00:00 via INTRAVENOUS

## 2013-11-13 MED ORDER — PROMETHAZINE HCL 25 MG/ML IJ SOLN
12.5000 mg | Freq: Once | INTRAMUSCULAR | Status: DC
  Filled 2013-11-13: qty 1

## 2013-11-13 MED ORDER — GLYCOPYRROLATE 0.2 MG/ML IJ SOLN
0.1000 mg | Freq: Once | INTRAMUSCULAR | Status: AC
  Administered 2013-11-13: 0.1 mg via INTRAVENOUS
  Filled 2013-11-13: qty 1

## 2013-11-13 MED ORDER — DIPHENOXYLATE-ATROPINE 2.5-0.025 MG PO TABS: 1.0000 | ORAL_TABLET | Freq: Four times a day (QID) | ORAL | Status: DC | PRN

## 2013-11-13 MED ORDER — ONDANSETRON 4 MG PO TBDP: 4.0000 mg | ORAL_TABLET | Freq: Three times a day (TID) | ORAL | Status: DC | PRN

## 2013-11-13 MED ORDER — SODIUM CHLORIDE 0.9 % IV BOLUS (SEPSIS)
1000.0000 mL | Freq: Once | INTRAVENOUS | Status: AC
  Administered 2013-11-13: 1000 mL via INTRAVENOUS

## 2013-11-13 MED ORDER — IOHEXOL 300 MG/ML  SOLN
100.0000 mL | Freq: Once | INTRAMUSCULAR | Status: AC | PRN
  Administered 2013-11-13: 100 mL via INTRAVENOUS

## 2013-11-13 MED ORDER — IOHEXOL 300 MG/ML  SOLN
50.0000 mL | Freq: Once | INTRAMUSCULAR | Status: AC | PRN
  Administered 2013-11-13: 50 mL via ORAL

## 2013-11-13 MED ORDER — MORPHINE SULFATE 4 MG/ML IJ SOLN: 4.0000 mg | INTRAMUSCULAR | Status: DC | PRN

## 2013-11-13 NOTE — Discharge Instructions (Signed)
Abdominal Pain, Women Abdominal (stomach, pelvic, or belly) pain can be caused by many things. It is important to tell your doctor:  The location of the pain.  Does it come and go or is it present all the time?  Are there things that start the pain (eating certain foods, exercise)?  Are there other symptoms associated with the pain (fever, nausea, vomiting, diarrhea)? All of this is helpful to know when trying to find the cause of the pain. CAUSES   Stomach: virus or bacteria infection, or ulcer.  Intestine: appendicitis (inflamed appendix), regional ileitis (Crohn's disease), ulcerative colitis (inflamed colon), irritable bowel syndrome, diverticulitis (inflamed diverticulum of the colon), or cancer of the stomach or intestine.  Gallbladder disease or stones in the gallbladder.  Kidney disease, kidney stones, or infection.  Pancreas infection or cancer.  Fibromyalgia (pain disorder).  Diseases of the female organs:  Uterus: fibroid (non-cancerous) tumors or infection.  Fallopian tubes: infection or tubal pregnancy.  Ovary: cysts or tumors.  Pelvic adhesions (scar tissue).  Endometriosis (uterus lining tissue growing in the pelvis and on the pelvic organs).  Pelvic congestion syndrome (female organs filling up with blood just before the menstrual period).  Pain with the menstrual period.  Pain with ovulation (producing an egg).  Pain with an IUD (intrauterine device, birth control) in the uterus.  Cancer of the female organs.  Functional pain (pain not caused by a disease, may improve without treatment).  Psychological pain.  Depression. DIAGNOSIS  Your doctor will decide the seriousness of your pain by doing an examination.  Blood tests.  X-rays.  Ultrasound.  CT scan (computed tomography, special type of X-ray).  MRI (magnetic resonance imaging).  Cultures, for infection.  Barium enema (dye inserted in the large intestine, to better view it with  X-rays).  Colonoscopy (looking in intestine with a lighted tube).  Laparoscopy (minor surgery, looking in abdomen with a lighted tube).  Major abdominal exploratory surgery (looking in abdomen with a large incision). TREATMENT  The treatment will depend on the cause of the pain.   Many cases can be observed and treated at home.  Over-the-counter medicines recommended by your caregiver.  Prescription medicine.  Antibiotics, for infection.  Birth control pills, for painful periods or for ovulation pain.  Hormone treatment, for endometriosis.  Nerve blocking injections.  Physical therapy.  Antidepressants.  Counseling with a psychologist or psychiatrist.  Minor or major surgery. HOME CARE INSTRUCTIONS   Do not take laxatives, unless directed by your caregiver.  Take over-the-counter pain medicine only if ordered by your caregiver. Do not take aspirin because it can cause an upset stomach or bleeding.  Try a clear liquid diet (broth or water) as ordered by your caregiver. Slowly move to a bland diet, as tolerated, if the pain is related to the stomach or intestine.  Have a thermometer and take your temperature several times a day, and record it.  Bed rest and sleep, if it helps the pain.  Avoid sexual intercourse, if it causes pain.  Avoid stressful situations.  Keep your follow-up appointments and tests, as your caregiver orders.  If the pain does not go away with medicine or surgery, you may try:  Acupuncture.  Relaxation exercises (yoga, meditation).  Group therapy.  Counseling. SEEK MEDICAL CARE IF:   You notice certain foods cause stomach pain.  Your home care treatment is not helping your pain.  You need stronger pain medicine.  You want your IUD removed.  You feel faint or  lightheaded.  You develop nausea and vomiting.  You develop a rash.  You are having side effects or an allergy to your medicine. SEEK IMMEDIATE MEDICAL CARE IF:   Your  pain does not go away or gets worse.  You have a fever.  Your pain is felt only in portions of the abdomen. The right side could possibly be appendicitis. The left lower portion of the abdomen could be colitis or diverticulitis.  You are passing blood in your stools (bright red or black tarry stools, with or without vomiting).  You have blood in your urine.  You develop chills, with or without a fever.  You pass out. MAKE SURE YOU:   Understand these instructions.  Will watch your condition.  Will get help right away if you are not doing well or get worse. Document Released: 02/20/2007 Document Revised: 07/18/2011 Document Reviewed: 03/12/2009 [facility] Patient Information 2015 [place], [place]. This information is not intended to replace advice given to you by your health care provider. Make sure you discuss any questions you have with your health care provider.  Diarrhea Diarrhea is frequent loose and watery bowel movements. It can cause you to feel weak and dehydrated. Dehydration can cause you to become tired and thirsty, have a dry mouth, and have decreased urination that often is dark yellow. Diarrhea is a sign of another problem, most often an infection that will not last long. In most cases, diarrhea typically lasts 2-3 days. However, it can last longer if it is a sign of something more serious. It is important to treat your diarrhea as directed by your caregive to lessen or prevent future episodes of diarrhea. CAUSES  Some common causes include:  Gastrointestinal infections caused by viruses, bacteria, or parasites.  Food poisoning or food allergies.  Certain medicines, such as antibiotics, chemotherapy, and laxatives.  Artificial sweeteners and fructose.  Digestive disorders. HOME CARE INSTRUCTIONS  Ensure adequate fluid intake (hydration): have 1 cup (8 oz) of fluid for each diarrhea episode. Avoid fluids that contain simple sugars or sports drinks, fruit juices, whole  milk products, and sodas. Your urine should be clear or pale yellow if you are drinking enough fluids. Hydrate with an oral rehydration solution that you can purchase at pharmacies, retail stores, and online. You can prepare an oral rehydration solution at home by mixing the following ingredients together:   - tsp table salt.   tsp baking soda.   tsp salt substitute containing potassium chloride.  1  tablespoons sugar.  1 L (34 oz) of water.  Certain foods and beverages may increase the speed at which food moves through the gastrointestinal (GI) tract. These foods and beverages should be avoided and include:  Caffeinated and alcoholic beverages.  High-fiber foods, such as raw fruits and vegetables, nuts, seeds, and whole grain breads and cereals.  Foods and beverages sweetened with sugar alcohols, such as xylitol, sorbitol, and mannitol.  Some foods may be well tolerated and may help thicken stool including:  Starchy foods, such as rice, toast, pasta, low-sugar cereal, oatmeal, grits, baked potatoes, crackers, and bagels.  Bananas.  Applesauce.  Add probiotic-rich foods to help increase healthy bacteria in the GI tract, such as yogurt and fermented milk products.  Wash your hands well after each diarrhea episode.  Only take over-the-counter or prescription medicines as directed by your caregiver.  Take a warm bath to relieve any burning or pain from frequent diarrhea episodes. SEEK IMMEDIATE MEDICAL CARE IF:   You are unable to  keep fluids down.  You have persistent vomiting.  You have blood in your stool, or your stools are black and tarry.  You do not urinate in 6-8 hours, or there is only a small amount of very dark urine.  You have abdominal pain that increases or localizes.  You have weakness, dizziness, confusion, or lightheadedness.  You have a severe headache.  Your diarrhea gets worse or does not get better.  You have a fever or persistent symptoms for  more than 2-3 days.  You have a fever and your symptoms suddenly get worse. MAKE SURE YOU:   Understand these instructions.  Will watch your condition.  Will get help right away if you are not doing well or get worse. Document Released: 04/15/2002 Document Revised: 04/11/2012 Document Reviewed: 01/01/2012 [facility] Patient Information 2015 [place], [place]. This information is not intended to replace advice given to you by your health care provider. Make sure you discuss any questions you have with your health care provider.

## 2013-11-13 NOTE — ED Provider Notes (Signed)
CSN: [id number]     Arrival date & time 11/12/13  2253 History   First MD Initiated Contact with Patient 11/12/13 2347     Chief Complaint  Patient presents with  . Abdominal Cramping  . Abdominal Pain      HPI  Presents with abdominal pain and diarrhea. Has a 30 year history of ulcerative colitis. Recent diagnosis of cholelithiasis. Started on Sunday night late after a salad for dinner. Start of intermittent bilateral upper quadrant abdominal cramping pain area loose stools followed closely thereafter. No mucus. No blood. No fever. Continues loose stools today. Most recent colonoscopy did show a segment of ulcerative colitis. Does not tolerate treatment. Gets palpitations with prednisone. Sulfa "caused a flareup".  States this is not similar to her previous episodes of her colitis flareup. She is concerned that "maybe my gallbladder".  Past Medical History  Diagnosis Date  . Anxiety   . Panic disorder   . Other allergy, other than to medicinal agents   . Ulcerative colitis, unspecified   . Morbid obesity   . Vitamin B12 deficiency   . Vitamin D deficiency   . Cardiac arrhythmia     palpitations,PVC'sAC  . Chronic kidney disease     cyst right kidney  . Headache(784.0)     migraines occ.sinus,tension per patient  . Arthritis     knees,thumbs  . Anemia     20  yrs. ago not now  . Adrenal nodule   . Gallstones    Past Surgical History  Procedure Laterality Date  . Breast biopsy  11/2011    Left- benign  . Colonoscopy    . Sigmoidoscopy     Family History  Problem Relation Age of Onset  . Heart disease Father 21    pacemaker  . Atrial fibrillation Father   . Colon cancer Neg Hx   . Stomach cancer Neg Hx    History  Substance Use Topics  . Smoking status: Former Smoker    Types: Cigarettes    Quit date: 03/07/1979  . Smokeless tobacco: Never Used  . Alcohol Use: No   OB History   Grav Para Term Preterm Abortions TAB SAB Ect Mult Living                 Review  of Systems  Constitutional: Negative for fever, chills, diaphoresis, appetite change and fatigue.  HENT: Negative for mouth sores, sore throat and trouble swallowing.   Eyes: Negative for visual disturbance.  Respiratory: Negative for cough, chest tightness, shortness of breath and wheezing.   Cardiovascular: Negative for chest pain.  Gastrointestinal: Positive for nausea, abdominal pain and diarrhea. Negative for vomiting and abdominal distention.  Endocrine: Negative for polydipsia, polyphagia and polyuria.  Genitourinary: Negative for dysuria, frequency and hematuria.  Musculoskeletal: Negative for gait problem.  Skin: Negative for color change, pallor and rash.  Neurological: Negative for dizziness, syncope, light-headedness and headaches.  Hematological: Does not bruise/bleed easily.  Psychiatric/Behavioral: Negative for behavioral problems and confusion.      Allergies  Doxycycline; Sudafed; Diphenhydramine hcl; Mesalamine; Sulfasalazine; Caffeine; Penicillins; and Prednisone  Home Medications   Prior to Admission medications   Medication Sig Start Date End Date Taking? Authorizing Provider  ALPRAZolam Duanne Moron) 1 MG tablet Take half tab by mouth in am and one tab at bedtime 05/24/13  Yes Aleksei Plotnikov V, MD  atenolol (TENORMIN) 25 MG tablet Take 25 mg by mouth 2 (two) times daily.  06/05/13  Yes Josue Hector, MD  bismuth  subsalicylate (PEPTO BISMOL) 262 MG/15ML suspension Take 30 mLs by mouth every 6 (six) hours as needed for indigestion.   Yes Historical Provider, MD  cholecalciferol (VITAMIN D) 1000 UNITS tablet Take 2,000 Units by mouth daily.    Yes Historical Provider, MD  Cyanocobalamin (VITAMIN B-12) 1000 MCG SUBL Place 1 tablet (1,000 mcg total) under the tongue daily. 02/04/13  Yes Aleksei Plotnikov V, MD  ibuprofen (ADVIL,MOTRIN) 600 MG tablet Take 1 tablet (600 mg total) by mouth 2 (two) times daily as needed for headache. 06/21/13  Yes Aleksei Plotnikov V, MD    loperamide (IMODIUM A-D) 2 MG tablet Take 2 mg by mouth 4 (four) times daily as needed for diarrhea or loose stools.   Yes Historical Provider, MD  norethindrone-ethinyl estradiol (FEMHRT LOW DOSE) 0.5-2.5 MG-MCG per tablet Take 1 tablet by mouth daily. 01/28/12  Yes Burnis Medin, MD  Probiotic Product (PROBIOTIC DAILY PO) Take 2 capsules by mouth daily.    Yes Historical Provider, MD  diphenoxylate-atropine (LOMOTIL) 2.5-0.025 MG per tablet Take 1 tablet by mouth 4 (four) times daily as needed for diarrhea or loose stools. 11/13/13   Tanna Furry, MD  ondansetron (ZOFRAN ODT) 4 MG disintegrating tablet Take 1 tablet (4 mg total) by mouth every 8 (eight) hours as needed for nausea. 11/13/13   Tanna Furry, MD   BP 141/67  Pulse 94  Temp(Src) 98.1 F (36.7 C) (Oral)  Resp 14  Ht 5' 5"  (1.651 m)  Wt 253 lb (114.76 kg)  BMI 42.10 kg/m2  SpO2 100% Physical Exam  Constitutional: She is oriented to person, place, and time. She appears well-developed and well-nourished. No distress.  HENT:  Head: Normocephalic.  Eyes: Conjunctivae are normal. Pupils are equal, round, and reactive to light. No scleral icterus.  Neck: Normal range of motion. Neck supple. No thyromegaly present.  Cardiovascular: Normal rate and regular rhythm.  Exam reveals no gallop and no friction rub.   No murmur heard. Pulmonary/Chest: Effort normal and breath sounds normal. No respiratory distress. She has no wheezes. She has no rales.  Abdominal: Soft. Bowel sounds are normal. She exhibits no distension. There is no tenderness. There is no rebound.    Musculoskeletal: Normal range of motion.  Neurological: She is alert and oriented to person, place, and time.  Skin: Skin is warm and dry. No rash noted.  Psychiatric: She has a normal mood and affect. Her behavior is normal.    ED Course  Procedures (including critical care time) Labs Review Labs Reviewed  CBC WITH DIFFERENTIAL - Abnormal; Notable for the following:    WBC  12.1 (*)    Hemoglobin 15.1 (*)    Neutrophils Relative % 84 (*)    Neutro Abs 10.3 (*)    Lymphocytes Relative 10 (*)    All other components within normal limits  COMPREHENSIVE METABOLIC PANEL - Abnormal; Notable for the following:    Glucose, Bld 123 (*)    Albumin 3.4 (*)    GFR calc non Af Amer 66 (*)    GFR calc Af Amer 76 (*)    All other components within normal limits  URINALYSIS, ROUTINE W REFLEX MICROSCOPIC - Abnormal; Notable for the following:    Hgb urine dipstick TRACE (*)    Leukocytes, UA SMALL (*)    All other components within normal limits  LIPASE, BLOOD  URINE MICROSCOPIC-ADD ON    Imaging Review Ct Abdomen Pelvis W Contrast  11/13/2013   CLINICAL DATA:  Diarrhea and  cramping. Nausea for 5 days. White cell count 12.1. Red and white cells in the urine. History of ulcerative colitis.  EXAM: CT ABDOMEN AND PELVIS WITH CONTRAST  TECHNIQUE: Multidetector CT imaging of the abdomen and pelvis was performed using the standard protocol following bolus administration of intravenous contrast.  CONTRAST:  124m OMNIPAQUE IOHEXOL 300 MG/ML  SOLN  COMPARISON:  Ultrasound abdomen 06/06/2013.  MRI abdomen 05/1913.  FINDINGS: Lung bases are clear.  The liver, spleen, gallbladder, pancreas, abdominal aorta, inferior vena cava, and retroperitoneal lymph nodes are unremarkable. There are small bilateral adrenal gland nodules, similar to prior MRI examination and consistent with adenoma. Moderately prominent extrarenal collecting systems in both kidneys without ureterectasis. This is likely normal variation. The stomach, small bowel, and colon are not abnormally distended. Fluid an air-fluid levels demonstrated throughout the colon consistent with liquid stool. This may indicate inflammatory process or gastroenteritis. No free air or free fluid in the abdomen.  Pelvis: The appendix is normal. Uterus and ovaries are not enlarged. No free or loculated pelvic fluid collections. Bladder wall is not  thickened. No pelvic mass or lymphadenopathy. Degenerative changes in the lumbar spine. No destructive bone lesions.  IMPRESSION: Liquid stool in the colon. Stable appearance of bilateral adrenal gland adenomas. Prominent extrarenal pelvis series in the kidneys, likely normal variation.   Electronically Signed   By: WLucienne CapersM.D.   On: 11/13/2013 04:38     EKG Interpretation None      MDM   Final diagnoses:  Generalized abdominal pain  Diarrhea    CT shows liquid stool in the colon. No sign of obstruction perforation or bowel thickening. Plan will be symptomatic treatment.   MTanna Furry MD 11/13/13 [id number]

## 2013-11-13 NOTE — ED Notes (Signed)
Pt refusing nausea and pain medication at this time.

## 2013-11-13 NOTE — Telephone Encounter (Signed)
Reviewed and agree. No specofoc treatment  If it is a "bug", stay on liquid diet. May takr peptobismol 2 tabs qid till diarrhea subsides. ( black stools)

## 2013-11-13 NOTE — Telephone Encounter (Signed)
Spoke with patient and she went to ED last night. Was not able to determine what was going on. Thinks it may be viral "bug."

## 2013-11-13 NOTE — ED Notes (Signed)
Pt in bathroom with diarrhea. Cramping unchanged. Dr. Jeneen Rinks notified.

## 2013-11-14 NOTE — Telephone Encounter (Signed)
Patient given recommendations. 

## 2013-11-15 ENCOUNTER — Encounter: Payer: Self-pay | Admitting: Cardiovascular Disease

## 2013-11-15 ENCOUNTER — Encounter: Payer: Self-pay | Admitting: Internal Medicine

## 2013-11-18 ENCOUNTER — Other Ambulatory Visit: Payer: Self-pay | Admitting: *Deleted

## 2013-11-18 MED ORDER — ATENOLOL 50 MG PO TABS: 25.0000 mg | ORAL_TABLET | Freq: Two times a day (BID) | ORAL | Status: DC

## 2013-11-21 ENCOUNTER — Encounter: Payer: Self-pay | Admitting: Internal Medicine

## 2013-12-17 ENCOUNTER — Encounter: Payer: Self-pay | Admitting: Internal Medicine

## 2013-12-18 ENCOUNTER — Telehealth: Payer: Self-pay | Admitting: *Deleted

## 2013-12-18 NOTE — Telephone Encounter (Signed)
Line busy. Will try again later.

## 2013-12-18 NOTE — Telephone Encounter (Signed)
Cassie [name], can you, please ask for an addendum to the recent CT scan to state if DJD and renal collecting system prominence are new and if follow up imaging recommended? Thanx ----- Message -----

## 2013-12-18 NOTE — Telephone Encounter (Signed)
Please call pt with no significant change in the appearance of the kidney and the collecting system. No change in the appearance of the spine.

## 2013-12-18 NOTE — Telephone Encounter (Signed)
Spoke with Dr. Ilda Foil in radiology. He states this is essentially a normal report. The renal collecting system is the same as it was in 2013. He does not recommend a follow up imaging.

## 2013-12-19 NOTE — Telephone Encounter (Signed)
Patient given information as per Dr. Olevia Perches.

## 2013-12-31 ENCOUNTER — Ambulatory Visit: Payer: 59 | Admitting: Internal Medicine

## 2014-01-14 ENCOUNTER — Encounter (HOSPITAL_COMMUNITY): Payer: Self-pay | Admitting: Emergency Medicine

## 2014-01-14 ENCOUNTER — Ambulatory Visit: Payer: 59 | Admitting: Internal Medicine

## 2014-01-14 ENCOUNTER — Emergency Department (HOSPITAL_COMMUNITY)
Admission: EM | Admit: 2014-01-14 | Discharge: 2014-01-14 | Disposition: A | Discharge status: Home / Self Care | Payer: 59 | Attending: Emergency Medicine | Admitting: Emergency Medicine

## 2014-01-14 ENCOUNTER — Emergency Department (HOSPITAL_COMMUNITY): Payer: 59

## 2014-01-14 ENCOUNTER — Telehealth: Payer: Self-pay | Admitting: Internal Medicine

## 2014-01-14 DIAGNOSIS — D649 Anemia, unspecified: Secondary | ICD-10-CM | POA: Insufficient documentation

## 2014-01-14 DIAGNOSIS — N189 Chronic kidney disease, unspecified: Secondary | ICD-10-CM | POA: Diagnosis not present

## 2014-01-14 DIAGNOSIS — F411 Generalized anxiety disorder: Secondary | ICD-10-CM | POA: Insufficient documentation

## 2014-01-14 DIAGNOSIS — Z79899 Other long term (current) drug therapy: Secondary | ICD-10-CM | POA: Insufficient documentation

## 2014-01-14 DIAGNOSIS — R1011 Right upper quadrant pain: Secondary | ICD-10-CM | POA: Insufficient documentation

## 2014-01-14 DIAGNOSIS — Z87891 Personal history of nicotine dependence: Secondary | ICD-10-CM | POA: Insufficient documentation

## 2014-01-14 DIAGNOSIS — E538 Deficiency of other specified B group vitamins: Secondary | ICD-10-CM | POA: Insufficient documentation

## 2014-01-14 DIAGNOSIS — Z8679 Personal history of other diseases of the circulatory system: Secondary | ICD-10-CM | POA: Diagnosis not present

## 2014-01-14 DIAGNOSIS — F410 Panic disorder [episodic paroxysmal anxiety] without agoraphobia: Secondary | ICD-10-CM | POA: Diagnosis not present

## 2014-01-14 DIAGNOSIS — K8020 Calculus of gallbladder without cholecystitis without obstruction: Secondary | ICD-10-CM | POA: Diagnosis not present

## 2014-01-14 DIAGNOSIS — Z880 Allergy status to penicillin: Secondary | ICD-10-CM | POA: Insufficient documentation

## 2014-01-14 DIAGNOSIS — R1084 Generalized abdominal pain: Secondary | ICD-10-CM

## 2014-01-14 DIAGNOSIS — Z8739 Personal history of other diseases of the musculoskeletal system and connective tissue: Secondary | ICD-10-CM | POA: Diagnosis not present

## 2014-01-14 DIAGNOSIS — E559 Vitamin D deficiency, unspecified: Secondary | ICD-10-CM | POA: Insufficient documentation

## 2014-01-14 LAB — URINALYSIS, ROUTINE W REFLEX MICROSCOPIC
Bilirubin Urine: NEGATIVE
GLUCOSE, UA: NEGATIVE mg/dL
KETONES UR: NEGATIVE mg/dL
Leukocytes, UA: NEGATIVE
Nitrite: NEGATIVE
PROTEIN: NEGATIVE mg/dL
Specific Gravity, Urine: 1.02 (ref 1.005–1.030)
UROBILINOGEN UA: 0.2 mg/dL (ref 0.0–1.0)
pH: 5 (ref 5.0–8.0)

## 2014-01-14 LAB — CBC WITH DIFFERENTIAL/PLATELET
Basophils Absolute: 0 10*3/uL (ref 0.0–0.1)
Basophils Relative: 0 % (ref 0–1)
EOS PCT: 1 % (ref 0–5)
Eosinophils Absolute: 0.2 10*3/uL (ref 0.0–0.7)
HEMATOCRIT: 41.7 % (ref 36.0–46.0)
HEMOGLOBIN: 13.8 g/dL (ref 12.0–15.0)
LYMPHS ABS: 1.4 10*3/uL (ref 0.7–4.0)
Lymphocytes Relative: 12 % (ref 12–46)
MCH: 31.3 pg (ref 26.0–34.0)
MCHC: 33.1 g/dL (ref 30.0–36.0)
MCV: 94.6 fL (ref 78.0–100.0)
MONOS PCT: 3 % (ref 3–12)
Monocytes Absolute: 0.4 10*3/uL (ref 0.1–1.0)
Neutro Abs: 9.6 10*3/uL — ABNORMAL HIGH (ref 1.7–7.7)
Neutrophils Relative %: 84 % — ABNORMAL HIGH (ref 43–77)
Platelets: 279 10*3/uL (ref 150–400)
RBC: 4.41 MIL/uL (ref 3.87–5.11)
RDW: 13.6 % (ref 11.5–15.5)
WBC: 11.4 10*3/uL — AB (ref 4.0–10.5)

## 2014-01-14 LAB — COMPREHENSIVE METABOLIC PANEL
ALK PHOS: 74 U/L (ref 39–117)
ALT: 16 U/L (ref 0–35)
AST: 15 U/L (ref 0–37)
Albumin: 3.3 g/dL — ABNORMAL LOW (ref 3.5–5.2)
Anion gap: 12 (ref 5–15)
BUN: 10 mg/dL (ref 6–23)
CALCIUM: 9.4 mg/dL (ref 8.4–10.5)
CO2: 22 mEq/L (ref 19–32)
Chloride: 105 mEq/L (ref 96–112)
Creatinine, Ser: 0.8 mg/dL (ref 0.50–1.10)
GFR, EST NON AFRICAN AMERICAN: 82 mL/min — AB (ref >90)
GLUCOSE: 112 mg/dL — AB (ref 70–99)
Potassium: 4.8 mEq/L (ref 3.7–5.3)
Sodium: 139 mEq/L (ref 137–147)
Total Bilirubin: 0.2 mg/dL — ABNORMAL LOW (ref 0.3–1.2)
Total Protein: 7.8 g/dL (ref 6.0–8.3)

## 2014-01-14 LAB — URINE MICROSCOPIC-ADD ON

## 2014-01-14 LAB — I-STAT TROPONIN, ED: Troponin i, poc: 0 ng/mL (ref 0.00–0.08)

## 2014-01-14 LAB — LIPASE, BLOOD: Lipase: 42 U/L (ref 11–59)

## 2014-01-14 MED ORDER — HYDROCODONE-ACETAMINOPHEN 5-325 MG PO TABS
2.0000 | ORAL_TABLET | Freq: Once | ORAL | Status: AC
  Administered 2014-01-14: 2 via ORAL
  Filled 2014-01-14: qty 2

## 2014-01-14 MED ORDER — HYDROCODONE-ACETAMINOPHEN 5-325 MG PO TABS: 2.0000 | ORAL_TABLET | ORAL | Status: DC | PRN

## 2014-01-14 MED ORDER — ONDANSETRON 4 MG PO TBDP
4.0000 mg | ORAL_TABLET | Freq: Once | ORAL | Status: AC
  Administered 2014-01-14: 4 mg via ORAL
  Filled 2014-01-14: qty 1

## 2014-01-14 MED ORDER — OXYCODONE-ACETAMINOPHEN 5-325 MG PO TABS: 2.0000 | ORAL_TABLET | ORAL | Status: DC | PRN

## 2014-01-14 MED ORDER — OXYCODONE-ACETAMINOPHEN 5-325 MG PO TABS: 2.0000 | ORAL_TABLET | Freq: Once | ORAL | Status: DC

## 2014-01-14 NOTE — ED Provider Notes (Signed)
Medical screening examination/treatment/procedure(s) were conducted as a shared visit with non-physician practitioner(s) and myself.  I personally evaluated the patient during the encounter.   EKG Interpretation None     Patient presented with right upper quadrant abdominal pain. Patient has a history of gallstones. She is awaiting followup with General surgery for this problem. The patient's workup today does not suggest acute cholecystitis. Patient is declining most forms of analgesia, stating that she has had reactions to them. Case was discussed with General surgery, it is recommended she be discharged and followup in the office. Patient will return to the ER for worsening symptoms.  Orpah Greek, MD 01/14/14 2236

## 2014-01-14 NOTE — Telephone Encounter (Signed)
Spoke with patient and she woke up at 9 AM with abdominal pain that radiates to back and chest area. Started above the belly button.States it feels better when she is standing. She cannot sit still. She does not feel this is heart related pain. Does have some nausea. No vomiting. She does report loose stool today. She has not taken anything for pain. She states she may go to ED. No extender appointments today. Please, advise.

## 2014-01-14 NOTE — Discharge Instructions (Signed)
Cholelithiasis °Cholelithiasis (also called gallstones) is a form of gallbladder disease in which gallstones form in your gallbladder. The gallbladder is an organ that stores bile made in the liver, which helps digest fats. Gallstones begin as small crystals and slowly grow into stones. Gallstone pain occurs when the gallbladder spasms and a gallstone is blocking the duct. Pain can also occur when a stone passes out of the duct.  °RISK FACTORS °· Being female.   °· Having multiple pregnancies. Health care providers sometimes advise removing diseased gallbladders before future pregnancies.   °· Being obese. °· Eating a diet heavy in fried foods and fat.   °· Being older than 60 years and increasing age.   °· Prolonged use of medicines containing female hormones.   °· Having diabetes mellitus.   °· Rapidly losing weight.   °· Having a family history of gallstones (heredity).   °SYMPTOMS °· Nausea.   °· Vomiting. °· Abdominal pain.   °· Yellowing of the skin (jaundice).   °· Sudden pain. It may persist from several minutes to several hours. °· Fever.   °· Tenderness to the touch.  °In some cases, when gallstones do not move into the bile duct, people have no pain or symptoms. These are called "silent" gallstones.  °TREATMENT °Silent gallstones do not need treatment. In severe cases, emergency surgery may be required. Options for treatment include: °· Surgery to remove the gallbladder. This is the most common treatment. °· Medicines. These do not always work and may take 6-12 months or more to work. °· Shock wave treatment (extracorporeal biliary lithotripsy). In this treatment an ultrasound machine sends shock waves to the gallbladder to break gallstones into smaller pieces that can pass into the intestines or be dissolved by medicine. °HOME CARE INSTRUCTIONS  °· Only take over-the-counter or prescription medicines for pain, discomfort, or fever as directed by your health care provider.   °· Follow a low-fat diet until  seen again by your health care provider. Fat causes the gallbladder to contract, which can result in pain.   °· Follow up with your health care provider as directed. Attacks are almost always recurrent and surgery is usually required for permanent treatment.   °SEEK IMMEDIATE MEDICAL CARE IF:  °· Your pain increases and is not controlled by medicines.   °· You have a fever or persistent symptoms for more than 2-3 days.   °· You have a fever and your symptoms suddenly get worse.   °· You have persistent nausea and vomiting.   °MAKE SURE YOU:  °· Understand these instructions. °· Will watch your condition. °· Will get help right away if you are not doing well or get worse. °Document Released: 04/21/2005 Document Revised: 12/26/2012 Document Reviewed: 10/17/2012 °Exit[employer]® Patient Information ©2015 [employer]. This information is not intended to replace advice given to you by your health care provider. Make sure you discuss any questions you have with your health care provider. ° °

## 2014-01-14 NOTE — ED Provider Notes (Signed)
CSN: [id number]     Arrival date & time 01/14/14  1335 History   First MD Initiated Contact with Patient 01/14/14 1616     Chief Complaint  Patient presents with  . Abdominal Pain     (Consider location/radiation/quality/duration/timing/severity/associated sxs/prior Treatment) Patient is a 53 y.o. female presenting with abdominal pain. The history is provided by the patient. No language interpreter was used.  Abdominal Pain Pain location:  RUQ Pain quality: aching and stabbing   Pain radiates to:  Back Pain severity:  Severe Duration:  5 hours Timing:  Constant Progression:  Worsening Chronicity:  New Context: not recent illness   Relieved by:  Nothing Worsened by:  Nothing tried Ineffective treatments:  None tried Associated symptoms: no nausea and no vomiting   Risk factors: has not had multiple surgeries     Past Medical History  Diagnosis Date  . Anxiety   . Panic disorder   . Other allergy, other than to medicinal agents   . Ulcerative colitis, unspecified   . Morbid obesity   . Vitamin B12 deficiency   . Vitamin D deficiency   . Cardiac arrhythmia     palpitations,PVC'sAC  . Chronic kidney disease     cyst right kidney  . Headache(784.0)     migraines occ.sinus,tension per patient  . Arthritis     knees,thumbs  . Anemia     20 yrs. ago not now  . Adrenal nodule   . Gallstones    Past Surgical History  Procedure Laterality Date  . Breast biopsy  11/2011    Left- benign  . Colonoscopy    . Sigmoidoscopy     Family History  Problem Relation Age of Onset  . Heart disease Father 36    pacemaker  . Atrial fibrillation Father   . Colon cancer Neg Hx   . Stomach cancer Neg Hx    History  Substance Use Topics  . Smoking status: Former Smoker    Types: Cigarettes    Quit date: 03/07/1979  . Smokeless tobacco: Never Used  . Alcohol Use: No   OB History   Grav Para Term Preterm Abortions TAB SAB Ect Mult Living                 Review of Systems   Gastrointestinal: Positive for abdominal pain. Negative for nausea and vomiting.  All other systems reviewed and are negative.     Allergies  Doxycycline; Sudafed; Diphenhydramine hcl; Mesalamine; Sulfasalazine; Caffeine; Penicillins; and Prednisone  Home Medications   Prior to Admission medications   Medication Sig Start Date End Date Taking? Authorizing Provider  ALPRAZolam Duanne Moron) 1 MG tablet Take half tab by mouth in am and one tab at bedtime 05/24/13  Yes Aleksei Plotnikov V, MD  atenolol (TENORMIN) 50 MG tablet Take 0.5 tablets (25 mg total) by mouth 2 (two) times daily. 11/18/13  Yes Josue Hector, MD  cholecalciferol (VITAMIN D) 1000 UNITS tablet Take 2,000 Units by mouth daily.    Yes Historical Provider, MD  Cyanocobalamin (VITAMIN B-12) 1000 MCG SUBL Place 1 tablet (1,000 mcg total) under the tongue daily. 02/04/13  Yes Aleksei Plotnikov V, MD  diphenoxylate-atropine (LOMOTIL) 2.5-0.025 MG per tablet Take 1 tablet by mouth 4 (four) times daily as needed for diarrhea or loose stools. 11/13/13  Yes Tanna Furry, MD  norethindrone-ethinyl estradiol (FEMHRT LOW DOSE) 0.5-2.5 MG-MCG per tablet Take 1 tablet by mouth daily. 01/28/12  Yes Burnis Medin, MD  Probiotic Product (PROBIOTIC DAILY  PO) Take 2 capsules by mouth daily.    Yes Historical Provider, MD   BP 129/57  Pulse 58  Temp(Src) 97.8 F (36.6 C) (Oral)  Resp 19  SpO2 99% Physical Exam  Nursing note and vitals reviewed. Constitutional: She is oriented to person, place, and time. She appears well-developed and well-nourished.  HENT:  Head: Normocephalic and atraumatic.  Right Ear: External ear normal.  Left Ear: External ear normal.  Nose: Nose normal.  Mouth/Throat: Oropharynx is clear and moist.  Eyes: EOM are normal. Pupils are equal, round, and reactive to light.  Neck: Normal range of motion.  Cardiovascular: Normal rate and normal heart sounds.   Pulmonary/Chest: Effort normal.  Abdominal: She exhibits no  distension. There is tenderness.  Musculoskeletal: Normal range of motion.  Neurological: She is alert and oriented to person, place, and time.  Skin: Skin is warm.  Psychiatric: She has a normal mood and affect.    ED Course  Procedures (including critical care time) Labs Review Labs Reviewed  CBC WITH DIFFERENTIAL - Abnormal; Notable for the following:    WBC 11.4 (*)    Neutrophils Relative % 84 (*)    Neutro Abs 9.6 (*)    All other components within normal limits  COMPREHENSIVE METABOLIC PANEL - Abnormal; Notable for the following:    Glucose, Bld 112 (*)    Albumin 3.3 (*)    Total Bilirubin 0.2 (*)    GFR calc non Af Amer 82 (*)    All other components within normal limits  LIPASE, BLOOD  URINALYSIS, ROUTINE W REFLEX MICROSCOPIC  I-STAT TROPOININ, ED    Imaging Review No results found.   EKG Interpretation None      MDM  Pt declined pain medication  Labs returned which show wbc's of 11.4.  Normal lfts and lipase.   Ultrasound shows gallstones  No signs of acute cholecystitis.   I discussed with Dr. Harlow Asa who advised have pt follow up with her GI doctor,  Referral for    Final diagnoses:  Cholelithiasis without obstruction        Fransico Meadow, PA-C 01/14/14 2147

## 2014-01-14 NOTE — ED Notes (Signed)
Pt concerned bc she has not taken this type of pain meds before. Will monitor pt for 30 minutes after administration to ensure no reaction or intolerance to pain med.

## 2014-01-14 NOTE — ED Notes (Signed)
US at bedside

## 2014-01-14 NOTE — Telephone Encounter (Signed)
I have left a message on pt's phone  That I tried to reach her. She went to ED. Sounds like a biliary colic.

## 2014-01-14 NOTE — ED Notes (Signed)
Pt c/o abd pain that radiates to her back that started around 9 am this morning. Pt denies n/v/d.

## 2014-01-14 NOTE — ED Notes (Signed)
Bedside report received from previous RN, [facility].

## 2014-01-15 MED ORDER — HYDROCODONE-ACETAMINOPHEN 5-325 MG PO TABS: 2.0000 | ORAL_TABLET | ORAL | Status: DC | PRN

## 2014-01-15 NOTE — Telephone Encounter (Signed)
Referral made for [facility] Dr. Barry Dienes. Left a message for Darcey Nora at [place] also. Rx refilled. Patient notified. rx up front for pick up.

## 2014-01-15 NOTE — Telephone Encounter (Signed)
Spoke with Sonya at [facility] and scheduled OV 02/03/14 10:30 AM. Patient given appointment.

## 2014-01-15 NOTE — Telephone Encounter (Signed)
Please refill pain meds. Dr Juliene Pina.

## 2014-01-15 NOTE — Telephone Encounter (Signed)
Patient is taking Vicodin for pain. She is taking 2 tablets every 7 hours. She is a little dizzy with this. She is asking for pain medications from Dr. Olevia Perches. States she only got #16 at ED. She also is asking for recommendation for surgeon. Please, advise.

## 2014-01-16 ENCOUNTER — Telehealth: Payer: Self-pay | Admitting: Gastroenterology

## 2014-01-16 ENCOUNTER — Telehealth: Payer: Self-pay | Admitting: *Deleted

## 2014-01-16 NOTE — Telephone Encounter (Signed)
Pt continues to have severe upper abdominal pain, probably from cholecystitis.  Unable to get app't with sugery for 2 weeks.  Been to ER twice. Pt needs an OV Sept 11.

## 2014-01-16 NOTE — Telephone Encounter (Signed)
We cannot do anything for her medically. She needs to go to [facility] -in surgical clinic as an add on . Please call [facility] and ask for her to be added to emergency clinic.

## 2014-01-16 NOTE — Telephone Encounter (Signed)
Received a call from Dr. Dolores Patty call MD last night) that patient had called with severe pain due to gallstones. Vicodin not helping. [facility] to get patient urgent OV. Was told that [facility] does not see patient's urgently in office for gallstones that she would need to go to ED for this.  Patient notified and she states is going to try to "bear it out."

## 2014-01-17 ENCOUNTER — Ambulatory Visit (INDEPENDENT_AMBULATORY_CARE_PROVIDER_SITE_OTHER): Payer: 59 | Admitting: Gastroenterology

## 2014-01-17 ENCOUNTER — Inpatient Hospital Stay: Admission: AD | Admit: 2014-01-17 | Payer: 59 | Source: Ambulatory Visit | Admitting: General Surgery

## 2014-01-17 ENCOUNTER — Encounter (HOSPITAL_COMMUNITY): Admission: RE | Disposition: A | Discharge status: Home / Self Care | Payer: Self-pay | Source: Ambulatory Visit

## 2014-01-17 ENCOUNTER — Observation Stay (HOSPITAL_COMMUNITY)
Admission: RE | Admit: 2014-01-17 | Discharge: 2014-01-18 | Disposition: A | Discharge status: Home / Self Care | Payer: 59 | Source: Ambulatory Visit | Attending: General Surgery | Admitting: General Surgery

## 2014-01-17 ENCOUNTER — Encounter (HOSPITAL_COMMUNITY): Payer: 59 | Admitting: *Deleted

## 2014-01-17 ENCOUNTER — Ambulatory Visit (HOSPITAL_COMMUNITY): Payer: 59 | Admitting: *Deleted

## 2014-01-17 ENCOUNTER — Ambulatory Visit: Payer: 59 | Admitting: Internal Medicine

## 2014-01-17 ENCOUNTER — Encounter: Payer: Self-pay | Admitting: Gastroenterology

## 2014-01-17 ENCOUNTER — Ambulatory Visit (HOSPITAL_COMMUNITY): Payer: 59

## 2014-01-17 ENCOUNTER — Encounter (HOSPITAL_COMMUNITY): Payer: Self-pay | Admitting: *Deleted

## 2014-01-17 VITALS — BP 110/76 | HR 72 | Ht 65.0 in | Wt 250.8 lb

## 2014-01-17 DIAGNOSIS — R109 Unspecified abdominal pain: Secondary | ICD-10-CM | POA: Diagnosis present

## 2014-01-17 DIAGNOSIS — R002 Palpitations: Secondary | ICD-10-CM | POA: Insufficient documentation

## 2014-01-17 DIAGNOSIS — M129 Arthropathy, unspecified: Secondary | ICD-10-CM | POA: Insufficient documentation

## 2014-01-17 DIAGNOSIS — R1013 Epigastric pain: Secondary | ICD-10-CM

## 2014-01-17 DIAGNOSIS — F410 Panic disorder [episodic paroxysmal anxiety] without agoraphobia: Secondary | ICD-10-CM | POA: Diagnosis not present

## 2014-01-17 DIAGNOSIS — G43909 Migraine, unspecified, not intractable, without status migrainosus: Secondary | ICD-10-CM | POA: Diagnosis not present

## 2014-01-17 DIAGNOSIS — R1012 Left upper quadrant pain: Secondary | ICD-10-CM

## 2014-01-17 DIAGNOSIS — K5190 Ulcerative colitis, unspecified, without complications: Secondary | ICD-10-CM | POA: Insufficient documentation

## 2014-01-17 DIAGNOSIS — F411 Generalized anxiety disorder: Secondary | ICD-10-CM | POA: Insufficient documentation

## 2014-01-17 DIAGNOSIS — Z79899 Other long term (current) drug therapy: Secondary | ICD-10-CM | POA: Insufficient documentation

## 2014-01-17 DIAGNOSIS — Z888 Allergy status to other drugs, medicaments and biological substances status: Secondary | ICD-10-CM | POA: Diagnosis not present

## 2014-01-17 DIAGNOSIS — Z881 Allergy status to other antibiotic agents status: Secondary | ICD-10-CM | POA: Diagnosis not present

## 2014-01-17 DIAGNOSIS — Z882 Allergy status to sulfonamides status: Secondary | ICD-10-CM | POA: Diagnosis not present

## 2014-01-17 DIAGNOSIS — Z87891 Personal history of nicotine dependence: Secondary | ICD-10-CM | POA: Diagnosis not present

## 2014-01-17 DIAGNOSIS — K8000 Calculus of gallbladder with acute cholecystitis without obstruction: Secondary | ICD-10-CM | POA: Diagnosis not present

## 2014-01-17 DIAGNOSIS — E538 Deficiency of other specified B group vitamins: Secondary | ICD-10-CM | POA: Insufficient documentation

## 2014-01-17 DIAGNOSIS — E559 Vitamin D deficiency, unspecified: Secondary | ICD-10-CM | POA: Diagnosis not present

## 2014-01-17 DIAGNOSIS — I4949 Other premature depolarization: Secondary | ICD-10-CM | POA: Insufficient documentation

## 2014-01-17 DIAGNOSIS — Q6101 Congenital single renal cyst: Secondary | ICD-10-CM | POA: Diagnosis not present

## 2014-01-17 DIAGNOSIS — Z880 Allergy status to penicillin: Secondary | ICD-10-CM | POA: Insufficient documentation

## 2014-01-17 HISTORY — PX: CHOLECYSTECTOMY: SHX55

## 2014-01-17 SURGERY — LAPAROSCOPIC CHOLECYSTECTOMY WITH INTRAOPERATIVE CHOLANGIOGRAM
Anesthesia: General

## 2014-01-17 MED ORDER — LIDOCAINE HCL (CARDIAC) 20 MG/ML IV SOLN
INTRAVENOUS | Status: DC | PRN
  Administered 2014-01-17: 50 mg via INTRAVENOUS

## 2014-01-17 MED ORDER — HYDROMORPHONE HCL PF 1 MG/ML IJ SOLN: 0.2500 mg | INTRAMUSCULAR | Status: DC | PRN

## 2014-01-17 MED ORDER — ATENOLOL 25 MG PO TABS
25.0000 mg | ORAL_TABLET | Freq: Two times a day (BID) | ORAL | Status: DC
  Administered 2014-01-18: 25 mg via ORAL
  Filled 2014-01-17 (×3): qty 1

## 2014-01-17 MED ORDER — MEPERIDINE HCL 50 MG/ML IJ SOLN: 6.2500 mg | INTRAMUSCULAR | Status: DC | PRN

## 2014-01-17 MED ORDER — FENTANYL CITRATE 0.05 MG/ML IJ SOLN
INTRAMUSCULAR | Status: DC | PRN
  Administered 2014-01-17: 50 ug via INTRAVENOUS
  Administered 2014-01-17: 100 ug via INTRAVENOUS
  Administered 2014-01-17 (×2): 50 ug via INTRAVENOUS

## 2014-01-17 MED ORDER — 0.9 % SODIUM CHLORIDE (POUR BTL) OPTIME
TOPICAL | Status: DC | PRN
  Administered 2014-01-17: 1000 mL

## 2014-01-17 MED ORDER — PROPOFOL 10 MG/ML IV BOLUS
INTRAVENOUS | Status: DC | PRN
  Administered 2014-01-17: 180 mg via INTRAVENOUS

## 2014-01-17 MED ORDER — OXYCODONE HCL 5 MG PO TABS: 5.0000 mg | ORAL_TABLET | Freq: Once | ORAL | Status: DC | PRN

## 2014-01-17 MED ORDER — POTASSIUM CHLORIDE IN NACL 20-0.9 MEQ/L-% IV SOLN
INTRAVENOUS | Status: DC
  Filled 2014-01-17 (×2): qty 1000

## 2014-01-17 MED ORDER — HEPARIN SODIUM (PORCINE) 5000 UNIT/ML IJ SOLN
5000.0000 [IU] | Freq: Three times a day (TID) | INTRAMUSCULAR | Status: DC
  Administered 2014-01-17: 5000 [IU] via SUBCUTANEOUS
  Filled 2014-01-17 (×5): qty 1

## 2014-01-17 MED ORDER — NEOSTIGMINE METHYLSULFATE 10 MG/10ML IV SOLN
INTRAVENOUS | Status: DC | PRN
  Administered 2014-01-17: 4 mg via INTRAVENOUS

## 2014-01-17 MED ORDER — CIPROFLOXACIN IN D5W 400 MG/200ML IV SOLN
400.0000 mg | Freq: Once | INTRAVENOUS | Status: AC
  Administered 2014-01-17: 400 mg via INTRAVENOUS
  Filled 2014-01-17: qty 200

## 2014-01-17 MED ORDER — ROCURONIUM BROMIDE 100 MG/10ML IV SOLN
INTRAVENOUS | Status: DC | PRN
  Administered 2014-01-17: 10 mg via INTRAVENOUS
  Administered 2014-01-17: 5 mg via INTRAVENOUS
  Administered 2014-01-17: 25 mg via INTRAVENOUS

## 2014-01-17 MED ORDER — DEXAMETHASONE SODIUM PHOSPHATE 10 MG/ML IJ SOLN
INTRAMUSCULAR | Status: AC
  Filled 2014-01-17: qty 1

## 2014-01-17 MED ORDER — CIPROFLOXACIN IN D5W 400 MG/200ML IV SOLN
INTRAVENOUS | Status: AC
  Filled 2014-01-17: qty 200

## 2014-01-17 MED ORDER — IBUPROFEN 200 MG PO TABS
600.0000 mg | ORAL_TABLET | Freq: Four times a day (QID) | ORAL | Status: DC | PRN
  Administered 2014-01-17: 600 mg via ORAL
  Filled 2014-01-17: qty 3

## 2014-01-17 MED ORDER — MORPHINE SULFATE 2 MG/ML IJ SOLN: 2.0000 mg | INTRAMUSCULAR | Status: DC | PRN

## 2014-01-17 MED ORDER — PROMETHAZINE HCL 25 MG/ML IJ SOLN: 6.2500 mg | INTRAMUSCULAR | Status: DC | PRN

## 2014-01-17 MED ORDER — ONDANSETRON HCL 4 MG/2ML IJ SOLN
INTRAMUSCULAR | Status: DC | PRN
  Administered 2014-01-17: 4 mg via INTRAVENOUS

## 2014-01-17 MED ORDER — ALPRAZOLAM 1 MG PO TABS: 1.0000 mg | ORAL_TABLET | Freq: Every day | ORAL | Status: DC

## 2014-01-17 MED ORDER — PROBIOTIC DAILY PO CAPS: 2.0000 | ORAL_CAPSULE | Freq: Every day | ORAL | Status: DC

## 2014-01-17 MED ORDER — ALPRAZOLAM 0.5 MG PO TABS
0.5000 mg | ORAL_TABLET | Freq: Every day | ORAL | Status: DC
  Administered 2014-01-18: 0.5 mg via ORAL
  Filled 2014-01-17: qty 1

## 2014-01-17 MED ORDER — BUPIVACAINE-EPINEPHRINE (PF) 0.25% -1:200000 IJ SOLN
INTRAMUSCULAR | Status: AC
  Filled 2014-01-17: qty 30

## 2014-01-17 MED ORDER — ALPRAZOLAM 0.5 MG PO TABS: 0.5000 mg | ORAL_TABLET | Freq: Two times a day (BID) | ORAL | Status: DC

## 2014-01-17 MED ORDER — GLYCOPYRROLATE 0.2 MG/ML IJ SOLN
INTRAMUSCULAR | Status: DC | PRN
  Administered 2014-01-17: 0.6 mg via INTRAVENOUS

## 2014-01-17 MED ORDER — RISAQUAD PO CAPS
2.0000 | ORAL_CAPSULE | Freq: Every day | ORAL | Status: DC
  Filled 2014-01-17 (×2): qty 2

## 2014-01-17 MED ORDER — LACTATED RINGERS IR SOLN
Status: DC | PRN
  Administered 2014-01-17: 1000 mL

## 2014-01-17 MED ORDER — MIDAZOLAM HCL 5 MG/5ML IJ SOLN
INTRAMUSCULAR | Status: DC | PRN
  Administered 2014-01-17: 2 mg via INTRAVENOUS

## 2014-01-17 MED ORDER — SIMETHICONE 80 MG PO CHEW
80.0000 mg | CHEWABLE_TABLET | Freq: Four times a day (QID) | ORAL | Status: DC | PRN
  Administered 2014-01-17: 80 mg via ORAL
  Filled 2014-01-17: qty 1

## 2014-01-17 MED ORDER — SUCCINYLCHOLINE CHLORIDE 20 MG/ML IJ SOLN
INTRAMUSCULAR | Status: DC | PRN
  Administered 2014-01-17: 140 mg via INTRAVENOUS

## 2014-01-17 MED ORDER — OXYCODONE HCL 5 MG/5ML PO SOLN
5.0000 mg | Freq: Once | ORAL | Status: DC | PRN
  Filled 2014-01-17: qty 5

## 2014-01-17 MED ORDER — ONDANSETRON HCL 4 MG/2ML IJ SOLN
INTRAMUSCULAR | Status: AC
  Filled 2014-01-17: qty 2

## 2014-01-17 MED ORDER — BUPIVACAINE-EPINEPHRINE 0.25% -1:200000 IJ SOLN
INTRAMUSCULAR | Status: DC | PRN
  Administered 2014-01-17: 20 mL

## 2014-01-17 MED ORDER — HYDROCODONE-ACETAMINOPHEN 5-325 MG PO TABS: 2.0000 | ORAL_TABLET | ORAL | Status: DC | PRN

## 2014-01-17 MED ORDER — LACTATED RINGERS IV SOLN
INTRAVENOUS | Status: DC
  Administered 2014-01-17: 15:00:00 via INTRAVENOUS
  Administered 2014-01-17: 1000 mL via INTRAVENOUS

## 2014-01-17 MED ORDER — ONDANSETRON HCL 4 MG PO TABS: 4.0000 mg | ORAL_TABLET | Freq: Four times a day (QID) | ORAL | Status: DC | PRN

## 2014-01-17 MED ORDER — ONDANSETRON HCL 4 MG/2ML IJ SOLN
4.0000 mg | Freq: Four times a day (QID) | INTRAMUSCULAR | Status: DC | PRN
Start: 2014-01-17 — End: 2014-01-18

## 2014-01-17 MED ORDER — DEXAMETHASONE SODIUM PHOSPHATE 10 MG/ML IJ SOLN
INTRAMUSCULAR | Status: DC | PRN
  Administered 2014-01-17: 10 mg via INTRAVENOUS

## 2014-01-17 SURGICAL SUPPLY — 39 items
APPLIER CLIP ROT 10 11.4 M/L (STAPLE) ×3
CANISTER SUCTION 2500CC (MISCELLANEOUS) IMPLANT
CATH REDDICK CHOLANGI 4FR 50CM (CATHETERS) IMPLANT
CHLORAPREP W/TINT 26ML (MISCELLANEOUS) ×3 IMPLANT
CLIP APPLIE ROT 10 11.4 M/L (STAPLE) ×1 IMPLANT
COVER MAYO STAND STRL (DRAPES) ×3 IMPLANT
DECANTER SPIKE VIAL GLASS SM (MISCELLANEOUS) IMPLANT
DERMABOND ADVANCED (GAUZE/BANDAGES/DRESSINGS) ×2
DERMABOND ADVANCED .7 DNX12 (GAUZE/BANDAGES/DRESSINGS) ×1 IMPLANT
DRAPE C-ARM 42X120 X-RAY (DRAPES) ×3 IMPLANT
DRAPE LAPAROSCOPIC ABDOMINAL (DRAPES) ×3 IMPLANT
DRAPE UTILITY XL STRL (DRAPES) ×3 IMPLANT
ELECT REM PT RETURN 9FT ADLT (ELECTROSURGICAL) ×3
ELECTRODE REM PT RTRN 9FT ADLT (ELECTROSURGICAL) ×1 IMPLANT
GLOVE BIOGEL PI IND STRL 7.0 (GLOVE) ×1 IMPLANT
GLOVE BIOGEL PI IND STRL 7.5 (GLOVE) ×5 IMPLANT
GLOVE BIOGEL PI INDICATOR 7.0 (GLOVE) ×2
GLOVE BIOGEL PI INDICATOR 7.5 (GLOVE) ×10
GLOVE SS BIOGEL STRL SZ 7.5 (GLOVE) ×1 IMPLANT
GLOVE SUPERSENSE BIOGEL SZ 7.5 (GLOVE) ×2
GOWN STRL REUS W/TWL XL LVL3 (GOWN DISPOSABLE) ×12 IMPLANT
HEMOSTAT SNOW SURGICEL 2X4 (HEMOSTASIS) IMPLANT
KIT BASIN OR (CUSTOM PROCEDURE TRAY) ×3 IMPLANT
MANIFOLD NEPTUNE II (INSTRUMENTS) ×3 IMPLANT
NS IRRIG 1000ML POUR BTL (IV SOLUTION) ×3 IMPLANT
POUCH SPECIMEN RETRIEVAL 10MM (ENDOMECHANICALS) ×3 IMPLANT
SCISSORS LAP 5X35 DISP (ENDOMECHANICALS) ×3 IMPLANT
SET CHOLANGIOGRAPH MIX (MISCELLANEOUS) ×3 IMPLANT
SET IRRIG TUBING LAPAROSCOPIC (IRRIGATION / IRRIGATOR) ×3 IMPLANT
SLEEVE XCEL OPT CAN 5 100 (ENDOMECHANICALS) ×3 IMPLANT
SOLUTION ANTI FOG 6CC (MISCELLANEOUS) ×3 IMPLANT
SUT MNCRL AB 4-0 PS2 18 (SUTURE) ×3 IMPLANT
TOWEL OR 17X26 10 PK STRL BLUE (TOWEL DISPOSABLE) ×3 IMPLANT
TOWEL OR NON WOVEN STRL DISP B (DISPOSABLE) ×3 IMPLANT
TRAY LAP CHOLE (CUSTOM PROCEDURE TRAY) ×3 IMPLANT
TROCAR BLADELESS OPT 5 100 (ENDOMECHANICALS) ×3 IMPLANT
TROCAR XCEL BLUNT TIP 100MML (ENDOMECHANICALS) ×3 IMPLANT
TROCAR XCEL NON-BLD 11X100MML (ENDOMECHANICALS) ×3 IMPLANT
TUBING INSUFFLATION 10FT LAP (TUBING) ×3 IMPLANT

## 2014-01-17 NOTE — Transfer of Care (Signed)
Immediate Anesthesia Transfer of Care Note  Patient: Cassie [name]  Procedure(s) Performed: Procedure(s): LAPAROSCOPIC CHOLECYSTECTOMY (N/A)  Patient Location: PACU  Anesthesia Type:General  Level of Consciousness: awake, alert  and oriented  Airway & Oxygen Therapy: Patient Spontanous Breathing and Patient connected to face mask oxygen  Post-op Assessment: Report given to PACU RN and Post -op Vital signs reviewed and stable  Post vital signs: Reviewed and stable  Complications: No apparent anesthesia complications

## 2014-01-17 NOTE — Assessment & Plan Note (Signed)
Clinical in remission

## 2014-01-17 NOTE — Patient Instructions (Signed)
Go directly to admitting at [place] [facility]

## 2014-01-17 NOTE — H&P (Signed)
Cassie [name] is an 53 y.o. female.    Chief Complaint: Abdominal pain  HPI: Patient is a 53 year old female who 3 days ago developed the acute onset of abdominal pain. She describes a pressure or aching-like pain in her epigastrium radiating around her right rib cage and to her right mid abdomen. This is been associated with nausea but no vomiting. She presented to the emergency department 3 days ago. Gallbladder ultrasound as below was obtained showing cholelithiasis but no evidence of cholecystitis. She was treated symptomatically and discharged with plans for followup. She states that since that time however she has had constant unrelenting pain in the same area. No better or no worse. No fever chills or jaundice. She has been somewhat constipated the last couple of days. She called her GI office today and was seen and due to ongoing significant pain and is referred for urgent management. She does have a history of ulcerative colitis which has not been particularly active lately. No other significant GI history.  Past Medical History  Diagnosis Date  . Anxiety   . Panic disorder   . Other allergy, other than to medicinal agents   . Ulcerative colitis, unspecified   . Morbid obesity   . Vitamin B12 deficiency   . Vitamin D deficiency   . Cardiac arrhythmia     palpitations,PVC'sAC  . Chronic kidney disease     cyst right kidney  . Headache(784.0)     migraines occ.sinus,tension per patient  . Arthritis     knees,thumbs  . Anemia     20 yrs. ago not now  . Adrenal nodule   . Gallstones     Past Surgical History  Procedure Laterality Date  . Breast biopsy  11/2011    Left- benign  . Colonoscopy    . Sigmoidoscopy      Family History  Problem Relation Age of Onset  . Heart disease Father 64    pacemaker  . Atrial fibrillation Father   . Colon cancer Neg Hx   . Stomach cancer Neg Hx    Social History:  reports that she quit smoking about 34 years ago. Her smoking use  included Cigarettes. She smoked 0.00 packs per day. She has never used smokeless tobacco. She reports that she does not drink alcohol or use illicit drugs.  Allergies:  Allergies  Allergen Reactions  . Doxycycline Palpitations  . Sudafed [Pseudoephedrine Hcl] Shortness Of Breath  . Diphenhydramine Hcl Swelling    REACTION: unspecified  . Mesalamine     REACTION: unspecified. Does not remember taking. Hair loss-thinks this was in an enema in 2004  . Sulfasalazine Diarrhea  . Caffeine Palpitations  . Penicillins Rash    REACTION: unspecified  . Prednisone Palpitations    REACTION: unspecified    Medications Prior to Admission  Medication Sig Dispense Refill  . ALPRAZolam (XANAX) 1 MG tablet Take 0.5-1 mg by mouth 2 (two) times daily. She takes half a tablet in the morning and one tablet at bedtime.      Marland Kitchen atenolol (TENORMIN) 50 MG tablet Take 0.5 tablets (25 mg total) by mouth 2 (two) times daily.  30 tablet  11  . cholecalciferol (VITAMIN D) 1000 UNITS tablet Take 2,000 Units by mouth daily with lunch.       Marland Kitchen HYDROcodone-acetaminophen (NORCO/VICODIN) 5-325 MG per tablet Take 2 tablets by mouth every 4 (four) hours as needed (For pain.).      Marland Kitchen norethindrone-ethinyl estradiol (FEMHRT LOW DOSE) 0.5-2.5  MG-MCG per tablet Take 1 tablet by mouth at bedtime.   28 tablet  0  . Probiotic Product (PROBIOTIC DAILY PO) Take 2 capsules by mouth daily with lunch.       . Simethicone (GAS-X PO) Take 1 tablet by mouth 4 (four) times daily as needed (For gas.). Do not exceed 6 tablets in a 24 hour period.      . vitamin B-12 (CYANOCOBALAMIN) 1000 MCG tablet Place 1,000 mcg under the tongue daily with lunch.        Lab work obtained from the emergency department showed a mildly elevated white count of about 11,000 and no elevated LFTs.  Imaging: EXAM:  ULTRASOUND ABDOMEN COMPLETE  COMPARISON: None.  FINDINGS:  Gallbladder:  Gallstones are identified. No wall thickening visualized. No   sonographic Murphy sign noted.  Common bile duct:  Diameter: 3.6 mm  Liver:  No focal lesion identified. There is diffuse increased echotexture  of the liver.  IVC:  No abnormality visualized.  Pancreas:  Visualized portion unremarkable.  Spleen:  Size and appearance within normal limits.  Right Kidney:  Length: 11.3 cm. Echogenicity within normal limits. No mass or  hydronephrosis visualized.  Left Kidney:  Length: 12.4 cm. Echogenicity within normal limits. No  hydronephrosis visualized. There is a 2.6 x 2.2 x 2.2 cm simple cyst  in the midpole left kidney.  Abdominal aorta:  No aneurysm visualized.  Other findings:  None.  IMPRESSION:  Cholelithiasis without sonographic evidence of acute cholecystitis.  Diffuse increased echotexture of the liver, nonspecific, but can be  seen in fatty infiltration of liver.  Electronically Signed  By: Abelardo Diesel M.D.  On: 01/14/2014 20:32   Review of Systems  Constitutional: Negative for fever and chills.  Respiratory: Negative.   Cardiovascular: Positive for palpitations. Negative for chest pain, orthopnea, claudication and leg swelling.  Gastrointestinal: Positive for nausea, abdominal pain and constipation. Negative for heartburn, vomiting, diarrhea, blood in stool and melena.    Blood pressure 121/104, pulse 70, temperature 98.2 F (36.8 C), temperature source Oral, resp. rate 20, height 5' 6"  (1.676 m), weight 250 lb (113.399 kg), SpO2 98.00%. Physical Exam  General: Alert, Obese Caucasian female Who appears uncomfortable Skin: Warm and dry without rash or infection. HEENT: No palpable masses or thyromegaly. Sclera nonicteric. Pupils equal round and reactive. Oropharynx clear. Lymph nodes: No cervical, supraclavicular, or inguinal nodes palpable. Lungs: Breath sounds clear and equal without increased work of breathing Cardiovascular: Regular rate and rhythm without murmur. No JVD or edema. Peripheral pulses intact. Abdomen:  Moderate tenderness with some guarding in the epigastrium and right upper quadrant. No hernias. Extremities: No edema or joint swelling or deformity. No chronic venous stasis changes. Neurologic: Alert and fully oriented. Gait normal.  Assessment/Plan Cholelithiasis with several days of persistent epigastric and right upper quadrant abdominal pain. Persistent biliary colic versus early acute cholecystitis. I've recommended proceeding with laparoscopic cholecystectomy.  I discussed the procedure in detail. We discussed the risks and benefits of a laparoscopic cholecystectomy and possible cholangiogram including, but not limited to bleeding, infection, injury to surrounding structures such as the intestine or liver, bile leak, retained gallstones, need to convert to an open procedure, prolonged diarrhea, blood clots such as  DVT, common bile duct injury, anesthesia risks, and possible need for additional procedures.  The likelihood of improvement in symptoms and return to the patient's normal status is good. We discussed the typical post-operative recovery course.  Jaquelynn Wanamaker T 01/17/2014, 1:36 PM

## 2014-01-17 NOTE — Anesthesia Postprocedure Evaluation (Signed)
  Anesthesia Post-op Note  Patient: Cassie [name]  Procedure(s) Performed: Procedure(s) (LRB): LAPAROSCOPIC CHOLECYSTECTOMY (N/A)  Patient Location: PACU  Anesthesia Type: General  Level of Consciousness: awake and alert   Airway and Oxygen Therapy: Patient Spontanous Breathing  Post-op Pain: mild  Post-op Assessment: Post-op Vital signs reviewed, Patient's Cardiovascular Status Stable, Respiratory Function Stable, Patent Airway and No signs of Nausea or vomiting  Last Vitals:  Filed Vitals:   01/17/14 1600  BP: 108/36  Pulse: 52  Temp:   Resp: 11    Post-op Vital Signs: stable   Complications: No apparent anesthesia complications

## 2014-01-17 NOTE — Progress Notes (Signed)
PACU note-----pt's heart rate 45-50; sinus brady; Dr. Kalman Shan notified, no new orders rec'd; pt may go to med surg floor when ready for discharge from PACU

## 2014-01-17 NOTE — Op Note (Signed)
Preoperative diagnosis: Cholelithiasis and cholecystitis  Postoperative diagnosis: Cholelithiasis and Acute gangrenous cholecystitis  Surgical procedure: Laparoscopic cholecystectomy   Surgeon: Marland Kitchen T. Antinette Keough M.D.  Assistant: None  Anesthesia: General Endotracheal  Complications: None  Estimated blood loss: Minimal  Description of procedure: The patient brought to the operating room, placed in the supine position on the operating table, and general endotracheal anesthesia induced. The abdomen was widely sterilely prepped and draped. The patient had received preoperative IV antibiotics and PAS were in place. Patient timeout was performed the correct procedure verified. Standard 4 port technique was used with an open Hassan cannula at the umbilicus and the remainder of the ports placed under direct vision. The gallbladder was visualized. It appeared Tense and severely acutely inflamed with early gangrenous changes. The gallbladder was aspirated and decompressed to allow the fundus to be grasped.. The fundus was grasped and elevated up over the liver and the infundibulum retracted inferiolaterally. Peritoneum anterior and posterior to close triangle was incised and fibrofatty tissue stripped off the neck of the gallbladder toward the porta hepatis. The distal gallbladder was thoroughly dissected. The cystic artery was identified in Calot's triangle and the cystic duct gallbladder junction dissected 360.  A good critical view was obtained. When the anatomy was clear the cystic duct was clipped at the gallbladder junction and An operative cholangiogram attempted. There however was a valve that could not get the cholangiocatheter easily passed although I could pass the tip of the hook easily into the cystic duct. Due to the severe inflammation I did not want to risk the integrity of the cystic duct and the cholangiogram was abandoned and the cystic duct was doubly clipped proximally and divided.  The cystic artery was doubly clipped proximally and distally and divided. The gallbladder was dissected free from its bed using hook cautery and removed through the umbilical port site. Complete hemostasis was obtained in the gallbladder bed. The right upper quadrant was thoroughly irrigated and hemostasis assured. Trochars were removed and all CO2 evacuated and the [facility] trocar site fascial defect closed. Skin incisions were closed with subcuticular Monocryl and Dermabond. Sponge needle and instrument counts were correct. The patient was taken to PACU in good condition.  Quintin Hjort T  01/17/2014

## 2014-01-17 NOTE — Progress Notes (Signed)
_                                                                                                                History of Present Illness:  Cassie [name]  is a 53 year old white female with history of ulcer colitis, obesity, here is emergency work in for evaluation of abdominal pain.  She was seen earlier this week in the ED for similar pain.  Over the past 4 days she's had severe upper abdominal pain with radiation around to the back.  Pain continues and is now  radiating  to the right lower quadrant and periumbilical area.  She denies nausea, vomiting or fever.  Pain is becoming unbearable despite taking hydrocodone.     Past Medical History  Diagnosis Date  . Anxiety   . Panic disorder   . Other allergy, other than to medicinal agents   . Ulcerative colitis, unspecified   . Morbid obesity   . Vitamin B12 deficiency   . Vitamin D deficiency   . Cardiac arrhythmia     palpitations,PVC'sAC  . Chronic kidney disease     cyst right kidney  . Headache(784.0)     migraines occ.sinus,tension per patient  . Arthritis     knees,thumbs  . Anemia     20 yrs. ago not now  . Adrenal nodule   . Gallstones    Past Surgical History  Procedure Laterality Date  . Breast biopsy  11/2011    Left- benign  . Colonoscopy    . Sigmoidoscopy     family history includes Atrial fibrillation in her father; Heart disease (age of onset: 36) in her father. There is no history of Colon cancer or Stomach cancer. Current Outpatient Prescriptions  Medication Sig Dispense Refill  . ALPRAZolam (XANAX) 1 MG tablet Take half tab by mouth in am and one tab at bedtime      . atenolol (TENORMIN) 50 MG tablet Take 0.5 tablets (25 mg total) by mouth 2 (two) times daily.  30 tablet  11  . cholecalciferol (VITAMIN D) 1000 UNITS tablet Take 2,000 Units by mouth daily.       . Cyanocobalamin (VITAMIN B-12) 1000 MCG SUBL Place 1 tablet (1,000 mcg total) under the tongue daily.  100 tablet  3  .  HYDROcodone-acetaminophen (NORCO/VICODIN) 5-325 MG per tablet Take 2 tablets by mouth every 4 (four) hours as needed.  16 tablet  0  . norethindrone-ethinyl estradiol (FEMHRT LOW DOSE) 0.5-2.5 MG-MCG per tablet Take 1 tablet by mouth daily.  28 tablet  0  . Probiotic Product (PROBIOTIC DAILY PO) Take 2 capsules by mouth daily.        No current facility-administered medications for this visit.   Allergies as of 01/17/2014 - Review Complete 01/17/2014  Allergen Reaction Noted  . Doxycycline Palpitations 11/12/2013  . Sudafed [pseudoephedrine hcl] Shortness Of Breath 02/07/2012  . Diphenhydramine hcl Swelling 11/17/2006  . Mesalamine  01/17/2007  . Sulfasalazine Diarrhea 01/17/2007  . Caffeine Palpitations 02/07/2012  .  Penicillins Rash 11/17/2006  . Prednisone Palpitations 12/01/2006    reports that she quit smoking about 34 years ago. Her smoking use included Cigarettes. She smoked 0.00 packs per day. She has never used smokeless tobacco. She reports that she does not drink alcohol or use illicit drugs.   Review of Systems: Pertinent positive and negative review of systems were noted in the above HPI section. All other review of systems were otherwise negative.  Vital signs were reviewed in today's medical record Physical Exam: General: Obese female appearing moderately uncomfortable  Skin: anicteric Head: Normocephalic and atraumatic Eyes:  sclerae anicteric, EOMI Ears: Normal auditory acuity Mouth: No deformity or lesions Neck: Supple, no masses or thyromegaly Lungs: Clear throughout to auscultation Heart: Regular rate and rhythm; no murmurs, rubs or bruits Abdomen: Obese, soft.  There is mild tenderness in the right upper quadrant without guarding or rebound.  There are no masses organomegaly Rectal:deferred Musculoskeletal: Symmetrical with no gross deformities  Skin: No lesions on visible extremities Pulses:  Normal pulses noted Extremities: No clubbing, cyanosis, edema or  deformities noted Neurological: Alert oriented x 4, grossly nonfocal Cervical Nodes:  No significant cervical adenopathy Inguinal Nodes: No significant inguinal adenopathy Psychological:  Alert and cooperative. Normal mood and affect  See Assessment and Plan under Problem List

## 2014-01-17 NOTE — Anesthesia Preprocedure Evaluation (Signed)
Anesthesia Evaluation  Patient identified by MRN, date of birth, ID band Patient awake    Reviewed: Allergy & Precautions, H&P , NPO status , Patient's Chart, lab work & pertinent test results  Airway Mallampati: II TM Distance: >3 FB Neck ROM: Full    Dental no notable dental hx.    Pulmonary neg pulmonary ROS, former smoker,  breath sounds clear to auscultation  Pulmonary exam normal       Cardiovascular negative cardio ROS  + dysrhythmias Rhythm:Regular Rate:Normal     Neuro/Psych  Headaches, PSYCHIATRIC DISORDERS Anxiety    GI/Hepatic negative GI ROS, Neg liver ROS, PUD,   Endo/Other  Morbid obesity  Renal/GU Renal disease     Musculoskeletal  (+) Arthritis -,   Abdominal (+) + obese,   Peds  Hematology negative hematology ROS (+) anemia ,   Anesthesia Other Findings   Reproductive/Obstetrics negative OB ROS                           Anesthesia Physical Anesthesia Plan  ASA: III  Anesthesia Plan: General   Post-op Pain Management:    Induction: Intravenous  Airway Management Planned: Oral ETT  Additional Equipment:   Intra-op Plan:   Post-operative Plan: Extubation in OR  Informed Consent: I have reviewed the patients History and Physical, chart, labs and discussed the procedure including the risks, benefits and alternatives for the proposed anesthesia with the patient or authorized representative who has indicated his/her understanding and acceptance.   Dental advisory given  Plan Discussed with: CRNA  Anesthesia Plan Comments:         Anesthesia Quick Evaluation

## 2014-01-17 NOTE — Telephone Encounter (Signed)
Dr. Deatra Ina states he wants to see patient today at 11:30 AM for OV. Spoke with patient and she will come for this OV.

## 2014-01-17 NOTE — Assessment & Plan Note (Addendum)
Four-day history of severe upper abdominal pain poorly controlled with narcotics. Ultrasound shows gallbladder stones but no acute inflammatory changes, I  suspect that pain is from acute biliary colic.  I discussed the case with Dr. Adonis Housekeeper from general surgery and we'll arrange to have the patient admitted for cholecystectomy today.

## 2014-01-18 DIAGNOSIS — K8000 Calculus of gallbladder with acute cholecystitis without obstruction: Secondary | ICD-10-CM | POA: Diagnosis not present

## 2014-01-18 MED ORDER — IBUPROFEN 600 MG PO TABS: 600.0000 mg | ORAL_TABLET | Freq: Four times a day (QID) | ORAL | Status: DC | PRN

## 2014-01-18 NOTE — Discharge Summary (Signed)
Physician Discharge Summary  Patient ID: Cassie [name] MRN: [id number] DOB/AGE: [birth year] 53 y.o.  Admit date: 01/17/2014 Discharge date: 01/18/2014  Admission Diagnoses: cholecystitis  Discharge Diagnoses:  Active Problems:   Cholelithiasis and acute cholecystitis without obstruction   Discharged Condition: good  Hospital Course: Patient was admitted after cholecystectomy.  She did well overnight.  Pain controlled.  Tolerating a diet.   Consults: None  Significant Diagnostic Studies: labs: cbc, chemistry  Treatments: IV hydration, analgesia: acetaminophen w/ codeine and surgery: lap cholecystectomy  Discharge Exam: Blood pressure 106/48, pulse 53, temperature 97.9 F (36.6 C), temperature source Oral, resp. rate 16, height 5' 6"  (1.676 m), weight 250 lb (113.399 kg), SpO2 97.00%. General appearance: alert and cooperative GI: normal findings: soft, non-tender Incision/Wound: clean, dry, intact  Disposition: 01-Home or Self Care     Medication List    ASK your doctor about these medications       ALPRAZolam 1 MG tablet  Commonly known as:  XANAX  Take 0.5-1 mg by mouth 2 (two) times daily. She takes half a tablet in the morning and one tablet at bedtime.     atenolol 50 MG tablet  Commonly known as:  TENORMIN  Take 0.5 tablets (25 mg total) by mouth 2 (two) times daily.     cholecalciferol 1000 UNITS tablet  Commonly known as:  VITAMIN D  Take 2,000 Units by mouth daily with lunch.     FEMHRT LOW DOSE 0.5-2.5 MG-MCG per tablet  Generic drug:  norethindrone-ethinyl estradiol  Take 1 tablet by mouth at bedtime.     GAS-X PO  Take 1 tablet by mouth 4 (four) times daily as needed (For gas.). Do not exceed 6 tablets in a 24 hour period.     HYDROcodone-acetaminophen 5-325 MG per tablet  Commonly known as:  NORCO/VICODIN  Take 2 tablets by mouth every 4 (four) hours as needed (For pain.).     PROBIOTIC DAILY PO  Take 2 capsules by mouth daily with lunch.      vitamin B-12 1000 MCG tablet  Commonly known as:  CYANOCOBALAMIN  Place 1,000 mcg under the tongue daily with lunch.           Follow-up Information   Follow up with HOXWORTH,BENJAMIN T, MD. Schedule an appointment as soon as possible for a visit in 2 weeks.   Specialty:  General Surgery   Contact information:   [address] [place] [address] [phone]       Signed: Rosario [name] 05/30/2409, 4:64 AM

## 2014-01-18 NOTE — Discharge Instructions (Signed)
LAPAROSCOPIC SURGERY: POST OP INSTRUCTIONS  1. DIET: Follow a light bland diet the first 24 hours after arrival home, such as soup, liquids, crackers, etc.  Be sure to include lots of fluids daily.  Avoid fast food or heavy meals as your are more likely to get nauseated.  Eat a low fat the next few days after surgery.   2. Take your usually prescribed home medications unless otherwise directed. 3. PAIN CONTROL: a. Pain is best controlled by a usual combination of three different methods TOGETHER: i. Ice/Heat ii. Over the counter pain medication iii. Prescription pain medication b. Most patients will experience some swelling and bruising around the incisions.  Ice packs or heating pads (30-60 minutes up to 6 times a day) will help. Use ice for the first few days to help decrease swelling and bruising, then switch to heat to help relax tight/sore spots and speed recovery.  Some people prefer to use ice alone, heat alone, alternating between ice & heat.  Experiment to what works for you.  Swelling and bruising can take several weeks to resolve.   c. It is helpful to take an over-the-counter pain medication regularly for the first few weeks.  Choose one of the following that works best for you: i. Naproxen (Aleve, etc)  Two 264m tabs twice a day ii. Ibuprofen (Advil, etc) Three 2024mtabs four times a day (every meal & bedtime) d. A  prescription for pain medication (such as percocet, vicodin, oxycodone, hydrocodone, etc) should be given to you upon discharge.  Take your pain medication as prescribed.  i. If you are having problems/concerns with the prescription medicine (does not control pain, nausea, vomiting, rash, itching, etc), please call [phone] see if we need to switch you to a different pain medicine that will work better for you and/or control your side effect better. ii. If you need a refill on your pain medication, please contact your pharmacy.  They will contact our office to  request authorization. Prescriptions will not be filled after 5 pm or on week-ends.   4. Avoid getting constipated.  Between the surgery and the pain medications, it is common to experience some constipation.  Increasing fluid intake and taking a fiber supplement (such as Metamucil, Citrucel, FiberCon, MiraLax, etc) 1-2 times a day regularly will usually help prevent this problem from occurring.  A mild laxative (prune juice, Milk of Magnesia, MiraLax, etc) should be taken according to package directions if there are no bowel movements after 48 hours.   5. Watch out for diarrhea.  If you have many loose bowel movements, simplify your diet to bland foods & liquids for a few days.  Stop any stool softeners and decrease your fiber supplement.  Switching to mild anti-diarrheal medications (Kayopectate, Pepto Bismol) can help.  If this worsens or does not improve, please call [email or web address]. Wash / shower every day.  You may shower over the dressings as they are waterproof.  Continue to shower over incision(s) after the dressing is off. 7. Remove your waterproof bandages 5 days after surgery.  You may leave the incision open to air.  You may replace a dressing/Band-Aid to cover the incision for comfort if you wish.  8. ACTIVITIES as tolerated:   a. You may resume regular (light) daily activities beginning the next day--such as daily self-care, walking, climbing stairs--gradually increasing activities as tolerated.  If you can walk 30 minutes without difficulty, it is safe to try more intense activity such as  jogging, treadmill, bicycling, low-impact aerobics, swimming, etc. b. Save the most intensive and strenuous activity for last such as sit-ups, heavy lifting, contact sports, etc  Refrain from any heavy lifting or straining until you are off narcotics for pain control.   c. DO NOT PUSH THROUGH PAIN.  Let pain be your guide: If it hurts to do something, don't do it.  Pain is your body warning you to avoid that  activity for another week until the pain goes down. d. You may drive when you are no longer taking prescription pain medication, you can comfortably wear a seatbelt, and you can safely maneuver your car and apply brakes. e. Dennis Bast may have sexual intercourse when it is comfortable.  9. FOLLOW UP in our office a. Please call [facility] at [phone] to set up an appointment to see your surgeon in the office for a follow-up appointment approximately 2-3 weeks after your surgery. b. Make sure that you call for this appointment the day you arrive home to insure a convenient appointment time. 10. IF YOU HAVE DISABILITY OR FAMILY LEAVE FORMS, BRING THEM TO THE OFFICE FOR PROCESSING.  DO NOT GIVE THEM TO YOUR DOCTOR.   WHEN TO CALL us [phone]: 1. Poor pain control 2. Reactions / problems with new medications (rash/itching, nausea, etc)  3. Fever over 101.5 F (38.5 C) 4. Inability to urinate 5. Nausea and/or vomiting 6. Worsening swelling or bruising 7. Continued bleeding from incision. 8. Increased pain, redness, or drainage from the incision   The clinic staff is available to answer your questions during regular business hours (8:30am-5pm).  Please dont hesitate to call and ask to speak to one of our nurses for clinical concerns.   If you have a medical emergency, go to the nearest emergency room or call 911.  A surgeon from [facility] is always on call at the [facility], [address], [place], [place], [place]  [address] ? MAIN: [phone] ? TOLL FREE: [phone] ?  FAX [phone] [email or web address]

## 2014-01-18 NOTE — Progress Notes (Signed)
UR completed 

## 2014-01-18 NOTE — Progress Notes (Signed)
Pt assessment unchanged.  Pt has received written and verbal discharge instructions.  Pt verbalizes that they understand the discharge instructions.  Pt has possession of all personal belongings.  Pt accompanied by husband at time of discharge and was transported to main entrance via wheelchair with nurse technician.   Izola Price ,RN 01/18/2014

## 2014-01-18 NOTE — Progress Notes (Signed)
Reviewed, appreciate  effgicient  Assessment from Dr Deatra Ina and communication with Dr Excell Seltzer

## 2014-01-20 ENCOUNTER — Encounter (HOSPITAL_COMMUNITY): Payer: Self-pay | Admitting: General Surgery

## 2014-02-07 ENCOUNTER — Encounter: Payer: Self-pay | Admitting: Internal Medicine

## 2014-02-07 ENCOUNTER — Ambulatory Visit (INDEPENDENT_AMBULATORY_CARE_PROVIDER_SITE_OTHER): Payer: 59 | Admitting: Internal Medicine

## 2014-02-07 VITALS — BP 110/68 | HR 68 | Ht 66.0 in | Wt 250.4 lb

## 2014-02-07 DIAGNOSIS — K5190 Ulcerative colitis, unspecified, without complications: Secondary | ICD-10-CM

## 2014-02-07 DIAGNOSIS — K5100 Ulcerative (chronic) pancolitis without complications: Secondary | ICD-10-CM

## 2014-02-07 NOTE — Progress Notes (Signed)
Cassie [name] 01-11-1961 [id number]  Note: This dictation was prepared with Dragon digital system. Any transcriptional errors that result from this procedure are unintentional.   History of Present Illness: This is a 53 year old white female with diagnosis of ulcerative colitis since 44ies.. Last office visit April 2015, discussed lap chole for cholelithiasis but decided not to proceed. She  became symptomatic 3 weeks ago and  was admitted with acute biliary colic  and underwent emergency laparoscopic cholecystectomy by Dr. Excell Seltzer with findings of gangrenous gall bladder.. She did very well and is feeling well at this point. She denies any problems with her colitis. She has been allergic to  Medications for IBD and has not been able to take mesalamine or Sulfasalazine.. She is having regular bowel movements and denies any visible blood per rectum. She  wants to return to   to Dr. Alain Marion,' care. She was discharged recently for not   following instructions for CT scan and canceling the imaging study. She feels it was a misunderstanding. She says she  rescheduled the CT scan. Her husband is still Dr.Plotnikov.s patient  And she would like to get back under hDr P. care     Past Medical History  Diagnosis Date  . Anxiety   . Panic disorder   . Other allergy, other than to medicinal agents   . Ulcerative colitis, unspecified   . Morbid obesity   . Vitamin B12 deficiency   . Vitamin D deficiency   . Cardiac arrhythmia     palpitations,PVC'sAC  . Chronic kidney disease     cyst right kidney  . Arthritis     knees,thumbs  . Anemia     20 yrs. ago not now  . Adrenal nodule   . Gallstones     Past Surgical History  Procedure Laterality Date  . Breast biopsy  11/2011    Left- benign  . Colonoscopy    . Sigmoidoscopy    . Cholecystectomy N/A 01/17/2014    Procedure: LAPAROSCOPIC CHOLECYSTECTOMY;  Surgeon: Excell Seltzer, MD;  Location: [facility];  Service: General;  Laterality: N/A;     Allergies  Allergen Reactions  . Doxycycline Palpitations  . Sudafed [Pseudoephedrine Hcl] Shortness Of Breath  . Diphenhydramine Hcl Swelling    REACTION: unspecified  . Mesalamine     REACTION: unspecified. Does not remember taking. Hair loss-thinks this was in an enema in 2004  . Sulfasalazine Diarrhea  . Caffeine Palpitations  . Penicillins Rash    REACTION: unspecified  . Prednisone Palpitations    REACTION: unspecified    Family history and social history have been reviewed.  Review of Systems: Denies nausea or vomiting. She has been tolerating regular diet    The remainder of the 10 point ROS is negative except as outlined in the H&P  Physical Exam: General Appearance Well developed, in no distress, morbidly obese  Eyes  Non icteric  HEENT  Non traumatic, normocephalic  Mouth No lesion, tongue papillated, no cheilosis Neck Supple without adenopathy, thyroid not enlarged, no carotid bruits, no JVD Lungs Clear to auscultation bilaterally COR Normal S1, normal S2, regular rhythm, no murmur, quiet precordium Abdomen Soft with minimal tenderness in right upper quadrant. Healed laparoscopy scars , normoactive bowel sounds  Rectal Note done  Extremities  No pedal edema Skin No lesions Neurological Alert and oriented x 3 Psychological Normal mood and affect  Assessment and Plan:   53 year old white female with ulcerative colitis of over 30 years duration who is on  no medication due to multiple allergies and remains asymptomatic. He  discussed possibility of biologicals such as Remicade or Humira but she would like to hold off since the she is asymptomatic. We have discussed the increased risks of colon cancer. I have changed for recall colonoscopy to November 2017 to rule out dysplasia  Postop cholecystectomy. Doing very well she has been released from the surgical care  Morbid obesity. Working on losing weight.   He would like to return to Dr. Judeen Hammans care,  she feels that  it was a miscommunication that she did not mean to disrespect directions.    Delfin Edis 02/07/2014

## 2014-02-07 NOTE — Patient Instructions (Addendum)
We are putting you in for a colon recall for November 2017.  Try either Clinical cytogeneticist probiotic.   I appreciate the opportunity to care for you. Dr Alain Marion

## 2014-02-09 ENCOUNTER — Encounter: Payer: Self-pay | Admitting: Internal Medicine

## 2014-02-27 ENCOUNTER — Encounter: Payer: Self-pay | Admitting: Internal Medicine

## 2014-02-28 ENCOUNTER — Encounter: Payer: Self-pay | Admitting: Internal Medicine

## 2014-03-04 ENCOUNTER — Ambulatory Visit: Payer: 59 | Admitting: Internal Medicine

## 2014-03-25 ENCOUNTER — Ambulatory Visit (INDEPENDENT_AMBULATORY_CARE_PROVIDER_SITE_OTHER): Payer: 59 | Admitting: Internal Medicine

## 2014-03-25 ENCOUNTER — Encounter: Payer: Self-pay | Admitting: Internal Medicine

## 2014-03-25 VITALS — BP 112/64 | HR 70 | Temp 98.5°F | Resp 12 | Wt 250.0 lb

## 2014-03-25 DIAGNOSIS — E278 Other specified disorders of adrenal gland: Secondary | ICD-10-CM

## 2014-03-25 DIAGNOSIS — R944 Abnormal results of kidney function studies: Secondary | ICD-10-CM

## 2014-03-25 LAB — BASIC METABOLIC PANEL
BUN: 11 mg/dL (ref 6–23)
CO2: 26 mEq/L (ref 19–32)
Calcium: 9.1 mg/dL (ref 8.4–10.5)
Chloride: 109 mEq/L (ref 96–112)
Creatinine, Ser: 0.9 mg/dL (ref 0.4–1.2)
GFR: 69.09 mL/min (ref >60.00)
Glucose, Bld: 93 mg/dL (ref 70–99)
POTASSIUM: 4.1 meq/L (ref 3.5–5.1)
SODIUM: 140 meq/L (ref 135–145)

## 2014-03-25 MED ORDER — DEXAMETHASONE 1 MG PO TABS: ORAL_TABLET | ORAL | Status: DC

## 2014-03-25 NOTE — Patient Instructions (Signed)
Please stop at the lab. Please come at 8 am for a cortisol check after taking the Dexamethasone tab at 11 pm the night before. Please return in 1 year.

## 2014-03-25 NOTE — Progress Notes (Signed)
Patient ID: Cassie [name], female   DOB: 1960/09/08, 53 y.o.   MRN: [id number]   HPI  Cassie [name] is a 53 y.o.-year-old female, referred by Dr. Delfin Edis, for evaluation and management bilateral adrenal incidentaloma. She is here with her husband who offers part of the hx. She has a new PCP (Dr Nancy Fetter).  Reviewed hx: Patient was incidentally found to have 2 adrenal nodules on a CT scan obtained before her colonoscopy from 2013. I reviewed pt's previous CT scan images from 02/10/2012: 31.5 HU nodule in the left adrenal gland measures 1.1 x 1.7 cm. Left adrenal nodule measures 9.5 HU nodule in the right adrenal gland measures 1 cm. Pt is worried especially about her Right adrenal nodule as it is more intense.  At last visit, I ordered an abdominal MRI with focus on the adrenal, in 05/2013, which showed stable appearance of the adrenal nodules.  Patient had an abdominal CT scan with contrast for investigation of diarrhea and abdominal cramping in 11/2013, and this confirmed stable adrenal nodules consistent with adenomas.  At last visit, the entire adrenal hormonal workup was normal: Component     Latest Ref Rng 03/25/2013 04/09/2013  Epinephrine      45   Norepinephrine      365   Dopamine      REPORT   Catecholamines, Total      410   PRA LC/MS/MS     0.25 - 5.82 ng/mL/h 3.69   ALDO / PRA Ratio     0.9 - 28.9 Ratio 2.4   ALDOSTERONE      9   Metanephrine, Free     <=57 pg/mL <25   Normetanephrine, Free     <=148 pg/mL 48   Total Metanephrines-Plasma     <=205 pg/mL 48   Potassium     3.5 - 5.1 mEq/L 4.2   Cortisol, Plasma       2.6  Dexamethasone, Serum       550   She does not have hot flushes, while on HRT, but has cold flushes. She has been on HRT for the last 3 years, previously on OCP.  She does not have HTN. Occasional palpitations. She is on Atenolol 25 mg bid.   She lost 23 lbs since last year - by changing the diet and reducing portion size. Previously, she  lost ~60 lbs.  Occasional HAs. She had lightheadedness.  Has UC. She has been on Prednisone for the UC years ago and also got inj with steroids in the past.   She has thinning hair - androgenic alopecia  - for the last 10 years.   She had cholecystectomy in 01/17/2014.   ROS: Constitutional: see HPI Eyes: no blurry vision, no xerophthalmia ENT: no sore throat, no nodules palpated in throat, no dysphagia/odynophagia, no hoarseness (post.) Cardiovascular: no CP/SOB/+ palpitations/no leg swelling Respiratory: no cough/SOB Gastrointestinal: no N/V/D/no C Musculoskeletal: no muscle/+ joint aches Skin: no rashes, + hair loss Neurological: no tremors/numbness/tingling/dizziness, + HA  PE: BP 112/64 mmHg  Pulse 70  Temp(Src) 98.5 F (36.9 C) (Oral)  Resp 12  Wt 250 lb (113.399 kg)  SpO2 98% Wt Readings from Last 3 Encounters:  03/25/14 250 lb (113.399 kg)  02/07/14 250 lb 6.4 oz (113.581 kg)  01/17/14 250 lb (113.399 kg)   Constitutional: obese, in NAD Eyes: PERRLA, EOMI, no exophthalmos, no lid lag, no stare ENT: moist mucous membranes, no thyromegaly, no cervical lymphadenopathy Cardiovascular: RRR, No MRG Respiratory: CTA  B Gastrointestinal: abdomen soft, NT, ND, BS+ Musculoskeletal: no deformities, strength intact in all 4 Skin: moist, warm, no rashes Neurological: + mild tremor with outstretched hands, DTR normal in all 4  ASSESSMENT: 1. Adrenal incidentaloma - plasma fractionated metanephrines and catecholamines normal - Plasma Renin activity and aldosterone normal - dexamethasone suppression test normal  02/10/2012 CT abdomen:  31.5 HU nodule in the left adrenal gland measures 1.1 x 1.7 cm. Adrenal nodule measures 9.5 HU nodule in the right adrenal gland measures 1 cm.   05/28/2013 MRI abdomen:  Adrenals: Enlargement of the left adrenal gland to 23 x 14 mm is similar to comparison CT. There is loss of signal intensity on opposed phase imaging consistent with a  benign adenoma. Mild nodular enlargement of the right adrenal gland to 11 mm is also unchanged. The right adrenal gland demonstrates loss signal intensity on opposed phase imaging consistent with a benign adenoma.  Lung bases are clear. No focal hepatic lesion. Multiple gallstones within the gallbladder. No biliary duct dilatation. Pancreas, S and spleen are normal. There peripelvic cysts within the left right kidney. No enhancing a cortical lesion.  The stomach and limited view of the small bowel colon are unremarkable. No retroperitoneal adenopathy. No aggressive osseous lesion.  IMPRESSION: 1. Bilateral small benign adrenal adenomas. 2. Cholelithiasis  11/13/2013 CT abdomen with contrast:  Lung bases are clear.  The liver, spleen, gallbladder, pancreas, abdominal aorta, inferior vena cava, and retroperitoneal lymph nodes are unremarkable. There are small bilateral adrenal gland nodules, similar to prior MRI examination and consistent with adenoma. Moderately prominent extrarenal collecting systems in both kidneys without ureterectasis. This is likely normal variation. The stomach, small bowel, and colon are not abnormally distended. Fluid an air-fluid levels demonstrated throughout the colon consistent with liquid stool. This may indicate inflammatory process or gastroenteritis. No free air or free fluid in the abdomen.  Pelvis: The appendix is normal. Uterus and ovaries are not enlarged. No free or loculated pelvic fluid collections. Bladder wall is not thickened. No pelvic mass or lymphadenopathy. Degenerative changes in the lumbar spine. No destructive bone lesions.  IMPRESSION: Liquid stool in the colon. Stable appearance of bilateral adrenal gland adenomas. Prominent extrarenal pelvis series in the kidneys, likely normal variation.  2. Low GFR  PLAN:  1. Patient with 2 (bilateral) adrenal nodules discovered incidentally.  - We again discussed about the fact  that there are 3 possible scenarios: - A nonfunctioning adrenal nodule >> this is most likely her case - A functioning adrenal adenoma - which can hypersecrete catecholamines/metanephrines, cortisol, or aldosterone - these appear unlikely due to normal hormonal workup a year ago. However, we'll repeat the workup this year. - Adrenal cancer/mets - no known cancer, and there was no increase in size between 03/2012 and 11/2013 imaging.  I suggested to repeat the CAT scan of her abdomen in 2 years to ensure size stability. - we reviewed her previous hormonal workup, which was negative for secreting adenoma: - dexamethasone suppression test to rule out Cushing syndrome (6% of adrenal incidentalomas) - Her cortisol suppressed to 2.6 (normal less than 5.0) >> Negative test - Plasma fractionated metanephrines and catecholamines to rule out pheochromocytoma (3% of adrenal incidentalomas) >> these were normal - Plasma renin activity and aldosterone level to rule out primary hyperaldosteronism (0.6% of adrenal incidentalomas) >> these were normal, too - we discussed about f/u:   hormonal testing yearly for 5 years - repeat at this visit  CT scans yearly x1-2 >> I  would repeat another CT scan in 2 years and then stop - I will see her back in a year  2. Low GFR - She is worried about this finding - reviewed recent GFR levels with her and her husband - a little lower per last checks: Lab Results  Component Value Date   GFR 65.08 07/17/2013   GFR 62.21 11/08/2012   GFR 73.31 05/28/2012   GFR 73.31 05/28/2012   GFR 72.43 01/27/2012  - I explained that she may have been dehydrated then as she had diarrhea - I will repeat the GFR today >> will FWD to Dr. Nancy Fetter  Office Visit on 03/25/2014  Component Date Value Ref Range Status  . Sodium 03/25/2014 140  135 - 145 mEq/L Final  . Potassium 03/25/2014 4.1  3.5 - 5.1 mEq/L Final  . Chloride 03/25/2014 109  96 - 112 mEq/L Final  . CO2 03/25/2014 26  19 - 32  mEq/L Final  . Glucose, Bld 03/25/2014 93  70 - 99 mg/dL Final  . BUN 03/25/2014 11  6 - 23 mg/dL Final  . Creatinine, Ser 03/25/2014 0.9  0.4 - 1.2 mg/dL Final  . Calcium 03/25/2014 9.1  8.4 - 10.5 mg/dL Final  . GFR 03/25/2014 69.09  >60.00 mL/min Final  . PRA LC/MS/MS 03/25/2014 4.07  0.25 - 5.82 ng/mL/h Final  . ALDO / PRA Ratio 03/25/2014 1.0  0.9 - 28.9 Ratio Final  . Aldosterone 03/25/2014 4   Final   Comment:      Adult Reference Ranges for Aldosterone,    LC/MS/MS:       Upright 8:00-10:00 am    < or = 28 ng/dL     Upright 4:00-6:00 pm     < or = 21 ng/dL     Supine  8:00-10:00 am    3-16 ng/dL   . Metanephrine, Free 03/25/2014 27  <=57 pg/mL Final  . Normetanephrine, Free 03/25/2014 59  <=148 pg/mL Final  . Total Metanephrines-Plasma 03/25/2014 86  <=205 pg/mL Final   Comment: For more information on this test, go to http://education.[facility].com/faq/MetFractFree Elevations >4-fold upper reference range: strongly suggestive of a pheochromocytoma(1). Elevations >1- 4-fold upper reference range: significant but not diagnostic, may be due to medications or stress. Suggest running 24 hr urine fractionated metanephrines and/or serum Chromagranin A for confirmation. Reference: (1)Algeciras-Schimnich A et al, Plasma Chromogranin A or Urine Fractionated Metanephrines Follow-Up Testing Improves the Diagnostic Accuracy of Plasma Fractionated Metanephrines for Pheochromocytoma. The Journal of Clinical Endocrinology # Metabolism 93(1), 91-95, 2008.   Marland Kitchen Epinephrine 03/25/2014 38   Final  . Norepinephrine 03/25/2014 300   Final  . Dopamine 03/25/2014 REPORT   Final   Comment: Results are below reportable range for this analyte, which is 30 pg/mL.   . Catecholamines, Total 03/25/2014 338   Final   Comment: Adult Reference Ranges for Catecholamines, Plasma Epinephrine         Supine:  LESS THAN 50 pg/mL                     Upright: LESS THAN 95 pg/mL Norepinephrine       Supine:  112-658 pg/mL                     Upright: [phone] pg/mL Dopamine            Supine:  LESS THAN 30 pg/mL  Upright: LESS THAN 30 pg/mL Total (N+E)         Supine:  123-671 pg/mL                     Upright: [phone] pg/mL Pediatric Reference Ranges for Catecholamines, Plasma Due to stress, plasma catecholamine levels are generally unreliable in infants and small children. Urinary catecholamine assays are more reliable. Epinephrine         Supine:  LESS THAN OR EQUAL    3-15 Years                TO 464 pg/mL                     Upright: Not Available Norepinephrine      Supine:  LESS THAN OR EQUAL    3-15 Years                TO 1251 pg/mL                     Upright: Not Available Dopamine            Supine:  LESS THAN 60 pg/mL    3-15 Years       Upright: Not Available Pedi                          atric data from [employer] [phone].    Component     Latest Ref Rng 04/01/2014  Cortisol, Plasma      2.2   All labs normal!

## 2014-03-28 LAB — METANEPHRINES, PLASMA
METANEPHRINE FREE: 27 pg/mL (ref <=57)
Normetanephrine, Free: 59 pg/mL (ref <=148)
Total Metanephrines-Plasma: 86 pg/mL (ref <=205)

## 2014-03-28 LAB — CATECHOLAMINES, FRACTIONATED, PLASMA
CATECHOLAMINES, TOTAL: 338 pg/mL
Epinephrine: 38 pg/mL
Norepinephrine: 300 pg/mL

## 2014-04-01 ENCOUNTER — Encounter: Payer: Self-pay | Admitting: Internal Medicine

## 2014-04-01 ENCOUNTER — Other Ambulatory Visit (INDEPENDENT_AMBULATORY_CARE_PROVIDER_SITE_OTHER): Payer: 59

## 2014-04-01 DIAGNOSIS — E278 Other specified disorders of adrenal gland: Secondary | ICD-10-CM

## 2014-04-01 LAB — ALDOSTERONE + RENIN ACTIVITY W/ RATIO
ALDO / PRA Ratio: 1 Ratio (ref 0.9–28.9)
Aldosterone: 4 ng/dL
PRA LC/MS/MS: 4.07 ng/mL/h (ref 0.25–5.82)

## 2014-04-01 LAB — CORTISOL: Cortisol, Plasma: 2.2 ug/dL

## 2014-04-13 NOTE — Progress Notes (Signed)
Patient ID: Cassie [name], female   DOB: Mar 26, 1961, 53 y.o.   MRN: [id number] Anxious 53 yo f/u chronic palpitations and some PVC;s seen on event monitor. No CAD and normal EF by echo Has been seen by EP who suggested only beta blockers  And no antiarrhythmic Rx. Has lost 30-40 lbs since I last saw her and has had atenolol dose lowered She continues to have some anxiety. Issues with possible breast CA and adrenal abnormality both resolved.   Has myriad of complaints again Dyspnic. Fatigue Palpitations Feels HR is too high but when she checks it is in 90's  No frank syncope Was released from practice by Dr Laurian Brim   Event monitor 5/15  NSR no arrhythmias    ROS: Denies fever, malais, weight loss, blurry vision, decreased visual acuity, cough, sputum, SOB, hemoptysis, pleuritic pain, palpitaitons, heartburn, abdominal pain, melena, lower extremity edema, claudication, or rash.  All other systems reviewed and negative  General: Affect appropriate Healthy:  appears stated age 20: normal Neck supple with no adenopathy JVP normal no bruits no thyromegaly Lungs clear with no wheezing and good diaphragmatic motion Heart:  S1/S2 no murmur, no rub, gallop or click PMI normal Abdomen: benighn, BS positve, no tenderness, no AAA no bruit.  No HSM or HJR Distal pulses intact with no bruits No edema Neuro non-focal Skin warm and dry No muscular weakness   Current Outpatient Prescriptions  Medication Sig Dispense Refill  . ALPRAZolam (XANAX) 1 MG tablet Take 0.5-1 mg by mouth 2 (two) times daily. She takes half a tablet in the morning and one tablet at bedtime.    Marland Kitchen atenolol (TENORMIN) 50 MG tablet Take 0.5 tablets (25 mg total) by mouth 2 (two) times daily. 30 tablet 11  . cholecalciferol (VITAMIN D) 1000 UNITS tablet Take 2,000 Units by mouth daily with lunch.     . ibuprofen (ADVIL,MOTRIN) 600 MG tablet Take 1 tablet (600 mg total) by mouth every 6 (six) hours as needed for fever,  headache, mild pain, moderate pain or cramping. 30 tablet 0  . JINTELI 1-5 MG-MCG TABS     . loperamide (IMODIUM A-D) 2 MG tablet Take 2 mg by mouth 4 (four) times daily as needed for diarrhea or loose stools.    . norethindrone-ethinyl estradiol (FEMHRT LOW DOSE) 0.5-2.5 MG-MCG per tablet Take 1 tablet by mouth at bedtime.  28 tablet 0  . Probiotic Product (PROBIOTIC DAILY PO) Take 2 capsules by mouth daily with lunch.     . Simethicone (GAS-X PO) Take 1 tablet by mouth 4 (four) times daily as needed (For gas.). Do not exceed 6 tablets in a 24 hour period.    . vitamin B-12 (CYANOCOBALAMIN) 1000 MCG tablet Place 1,000 mcg under the tongue daily with lunch.     No current facility-administered medications for this visit.    Allergies  Doxycycline; Sudafed; Diphenhydramine hcl; Mesalamine; Sulfasalazine; Caffeine; Penicillins; and Prednisone  Electrocardiogram:  9/15 sr  Normal ECG   Assessment and Plan

## 2014-04-14 ENCOUNTER — Ambulatory Visit (INDEPENDENT_AMBULATORY_CARE_PROVIDER_SITE_OTHER): Payer: 59 | Admitting: Cardiovascular Disease

## 2014-04-14 ENCOUNTER — Encounter: Payer: Self-pay | Admitting: Cardiovascular Disease

## 2014-04-14 VITALS — BP 122/68 | HR 63 | Ht 66.0 in | Wt 255.8 lb

## 2014-04-14 DIAGNOSIS — F418 Other specified anxiety disorders: Secondary | ICD-10-CM

## 2014-04-14 DIAGNOSIS — R1013 Epigastric pain: Secondary | ICD-10-CM

## 2014-04-14 DIAGNOSIS — I498 Other specified cardiac arrhythmias: Secondary | ICD-10-CM

## 2014-04-14 NOTE — Assessment & Plan Note (Signed)
Recent laparoscopic GB removal with improvement in symptoms  F/U [facility]

## 2014-04-14 NOTE — Assessment & Plan Note (Signed)
Improved on beta blocker

## 2014-04-14 NOTE — Patient Instructions (Signed)
Your physician wants you to follow-up in: YEAR WITH DR NISHAN  You will receive a reminder letter in the mail two months in advance. If you don't receive a letter, please call our office to schedule the follow-up appointment.  Your physician recommends that you continue on your current medications as directed. Please refer to the Current Medication list given to you today. 

## 2014-04-14 NOTE — Assessment & Plan Note (Signed)
Seems better continue current meds  Seeing new MD at [facility] as she was dismissed from [facility]

## 2014-05-12 ENCOUNTER — Encounter: Payer: Self-pay | Admitting: Internal Medicine

## 2014-05-26 ENCOUNTER — Ambulatory Visit: Payer: 59 | Admitting: Internal Medicine

## 2014-06-10 ENCOUNTER — Encounter: Payer: Self-pay | Admitting: Internal Medicine

## 2014-06-10 ENCOUNTER — Encounter: Payer: Self-pay | Admitting: Nurse Practitioner

## 2014-06-10 ENCOUNTER — Telehealth: Payer: Self-pay | Admitting: *Deleted

## 2014-06-10 ENCOUNTER — Ambulatory Visit (INDEPENDENT_AMBULATORY_CARE_PROVIDER_SITE_OTHER): Payer: 59 | Admitting: Nurse Practitioner

## 2014-06-10 ENCOUNTER — Other Ambulatory Visit (INDEPENDENT_AMBULATORY_CARE_PROVIDER_SITE_OTHER): Payer: 59

## 2014-06-10 VITALS — BP 128/78 | HR 76 | Ht 66.0 in | Wt 257.0 lb

## 2014-06-10 DIAGNOSIS — M7918 Myalgia, other site: Secondary | ICD-10-CM

## 2014-06-10 DIAGNOSIS — R194 Change in bowel habit: Secondary | ICD-10-CM

## 2014-06-10 DIAGNOSIS — M791 Myalgia: Secondary | ICD-10-CM

## 2014-06-10 DIAGNOSIS — K5190 Ulcerative colitis, unspecified, without complications: Secondary | ICD-10-CM

## 2014-06-10 DIAGNOSIS — K51919 Ulcerative colitis, unspecified with unspecified complications: Secondary | ICD-10-CM

## 2014-06-10 LAB — HEPATIC FUNCTION PANEL
ALBUMIN: 3.8 g/dL (ref 3.5–5.2)
ALT: 18 U/L (ref 0–35)
AST: 15 U/L (ref 0–37)
Alkaline Phosphatase: 68 U/L (ref 39–117)
Bilirubin, Direct: 0.1 mg/dL (ref 0.0–0.3)
Total Bilirubin: 0.5 mg/dL (ref 0.2–1.2)
Total Protein: 7.3 g/dL (ref 6.0–8.3)

## 2014-06-10 LAB — AMYLASE: Amylase: 40 U/L (ref 27–131)

## 2014-06-10 LAB — LIPASE: Lipase: 47 U/L (ref 11.0–59.0)

## 2014-06-10 NOTE — Patient Instructions (Addendum)
Please purchase the following medications over the counter and take as directed: Take Ibuprofen 490m daily for 7 days Take prilosec every am for 7 days  Your physician has requested that you go to the basement for lab work before leaving today  You will get a reminder for colonoscopy in Nov 2017

## 2014-06-10 NOTE — Telephone Encounter (Signed)
-----   Message from Lafayette Dragon, MD sent at 06/10/2014 11:59 AM EST ----- Cassie [name] is having pain and diarrhea, she wrote me a note in My chart. She could be seen by an extender? Or just come to have CBC, sed. Rate, LFT's, amy, lipase.

## 2014-06-10 NOTE — Telephone Encounter (Signed)
Spoke with patient and scheduled OV with Tye Savoy, NP today at 3:00 PM.

## 2014-06-11 ENCOUNTER — Encounter: Payer: Self-pay | Admitting: Nurse Practitioner

## 2014-06-11 DIAGNOSIS — M7918 Myalgia, other site: Secondary | ICD-10-CM | POA: Insufficient documentation

## 2014-06-11 NOTE — Progress Notes (Signed)
Reviewed and agree.I am glad she was seen, she is difficult to assess over the phone.

## 2014-06-11 NOTE — Progress Notes (Signed)
     History of Present Illness:   Patient is a 54 year old female with a longstanding history of ulcerative colitis. She is untreated due to intolerance of medication. Patient feels she is actually doing fine from IBD standpoint. For the most part her BMs are normal. She has no blood in stool nor lower abdominal discomfort.   Patient comes in today for evaluation of nausea, vomiting and abdominal pain. On Saturday night patient ate junk food then sat at a table for several hours with friends. The following day after eating [place] food for lunch she developed loose stool and upper abdominal pain radiating around both of her sides. Pain reminiscent of gallbladder attacks. Patient took some imodium. She is feeling better today, just sore and no further diarrhea. Patient thinks that sitting in chair at table for several hours may have caused some musculoskeletal discomfort but wanted to make sure there isn't anything else going on.   Current Medications, Allergies, Past Medical History, Past Surgical History, Family History and Social History were reviewed in [employer] record.   Physical Exam: General: Pleasant, well developed , white female in no acute distress Head: Normocephalic and atraumatic Eyes:  sclerae anicteric, conjunctiva pink  Ears: Normal auditory acuity Lungs: Clear throughout to auscultation Heart: Regular rate and rhythm Abdomen: Soft, non distended, non-tender. No masses, no hepatomegaly. Normal bowel sounds Musculoskeletal: Symmetrical with no gross deformities. Moderate tenderness of both right and left lower anterior rib cage. No abdominal tenderness.   Extremities: No edema  Neurological: Alert oriented x 4, grossly nonfocal Psychological:  Alert and cooperative. Normal mood and affect  Assessment and Recommendations:  30. 54 year old female with bilateral ribcage pain / tenderness. No abdominal pain. Pain reminiscent of gallbladder pain which  she had removed in September. This seems to be musculoskeletal but will obtain LFTs. If labs normal will try Ibuprofen for 7 days. Advised daily PPI while on NSAID. Will call her with lab results in next day or so.Patient will call us in 7-10 days with a condition update.   2. Longstanding ulcerative colitis. Last colonoscopy Nov 2014 with findings of left sided colitis, moderate colitis of transverse colon and severe colitis at splenic flexure. Recum spared.  Patient not tolerant / allergic to IBD meds. Seems to be doing relatively well without treatment. She had loose stool on Sunday after [place] food. Patient will call us for recurrent diarrhea, lower abdominal pain or any other symptoms suggestive for UC flare.

## 2014-06-26 ENCOUNTER — Encounter: Payer: Self-pay | Admitting: Internal Medicine

## 2014-06-26 ENCOUNTER — Ambulatory Visit (INDEPENDENT_AMBULATORY_CARE_PROVIDER_SITE_OTHER): Payer: 59 | Admitting: Internal Medicine

## 2014-06-26 ENCOUNTER — Other Ambulatory Visit (INDEPENDENT_AMBULATORY_CARE_PROVIDER_SITE_OTHER): Payer: 59

## 2014-06-26 VITALS — BP 120/82 | HR 81 | Temp 98.1°F | Resp 14 | Ht 66.0 in | Wt 255.0 lb

## 2014-06-26 DIAGNOSIS — K5190 Ulcerative colitis, unspecified, without complications: Secondary | ICD-10-CM

## 2014-06-26 DIAGNOSIS — Z0000 Encounter for general adult medical examination without abnormal findings: Secondary | ICD-10-CM

## 2014-06-26 DIAGNOSIS — E538 Deficiency of other specified B group vitamins: Secondary | ICD-10-CM

## 2014-06-26 LAB — BASIC METABOLIC PANEL
BUN: 12 mg/dL (ref 6–23)
CALCIUM: 9.4 mg/dL (ref 8.4–10.5)
CO2: 30 mEq/L (ref 19–32)
Chloride: 106 mEq/L (ref 96–112)
Creatinine, Ser: 0.9 mg/dL (ref 0.40–1.20)
GFR: 69.02 mL/min (ref >60.00)
Glucose, Bld: 94 mg/dL (ref 70–99)
POTASSIUM: 4.7 meq/L (ref 3.5–5.1)
SODIUM: 139 meq/L (ref 135–145)

## 2014-06-26 LAB — CBC
HCT: 41.2 % (ref 36.0–46.0)
Hemoglobin: 13.9 g/dL (ref 12.0–15.0)
MCHC: 33.8 g/dL (ref 30.0–36.0)
MCV: 92.5 fl (ref 78.0–100.0)
PLATELETS: 289 10*3/uL (ref 150.0–400.0)
RBC: 4.45 Mil/uL (ref 3.87–5.11)
RDW: 13.8 % (ref 11.5–15.5)
WBC: 8.1 10*3/uL (ref 4.0–10.5)

## 2014-06-26 LAB — SEDIMENTATION RATE: Sed Rate: 46 mm/hr — ABNORMAL HIGH (ref 0–22)

## 2014-06-26 NOTE — Patient Instructions (Signed)
We will send in for the b12 that is a swallowable tablet that you do not have to dissolve under the tongue to see if it does better.   We will check the blood work today and call you back with the results.   Think about water aerobics as a way to exercise.

## 2014-06-26 NOTE — Progress Notes (Signed)
Pre visit review using our clinic review tool, if applicable. No additional management support is needed unless otherwise documented below in the visit note. 

## 2014-06-27 DIAGNOSIS — Z0000 Encounter for general adult medical examination without abnormal findings: Secondary | ICD-10-CM | POA: Insufficient documentation

## 2014-06-27 MED ORDER — VITAMIN B-12 1000 MCG PO TABS: 1000.0000 ug | ORAL_TABLET | Freq: Every day | ORAL | Status: DC

## 2014-06-27 NOTE — Assessment & Plan Note (Signed)
She thinks that her current SL tablet is causing the sinus pain and will change to tablet.

## 2014-06-27 NOTE — Progress Notes (Signed)
   Subjective:    Patient ID: Cassie [name], female    DOB: 30-Sep-1960, 54 y.o.   MRN: [id number]  HPI The patient is a 54 YO female who is coming in for wellness. She denies new problems since last visit although she has a mole she wants me to look at. She is having some mild extra congestion in her sinuses.   PMH, [facility], social history, medications, allergies, problem list reviewed and updated.   Review of Systems  Constitutional: Negative for fever, chills, activity change, appetite change, fatigue and unexpected weight change.  HENT: Positive for congestion and sinus pressure. Negative for ear pain, facial swelling, postnasal drip, rhinorrhea and sore throat.   Eyes: Negative.   Respiratory: Positive for cough. Negative for chest tightness, shortness of breath and wheezing.        Mild  Cardiovascular: Negative for chest pain, palpitations and leg swelling.  Gastrointestinal: Negative for abdominal pain, diarrhea, constipation and abdominal distention.  Endocrine: Negative.   Musculoskeletal: Positive for myalgias. Negative for back pain, arthralgias and gait problem.  Skin: Positive for color change. Negative for pallor, rash and wound.  Neurological: Negative.   Psychiatric/Behavioral: Positive for dysphoric mood. Negative for behavioral problems and decreased concentration.      Objective:   Physical Exam  Constitutional: She is oriented to person, place, and time. She appears well-developed and well-nourished.  HENT:  Head: Normocephalic and atraumatic.  Eyes: EOM are normal.  Neck: Normal range of motion.  Cardiovascular: Normal rate and regular rhythm.   No murmur heard. Pulmonary/Chest: Effort normal and breath sounds normal. No respiratory distress. She has no wheezes. She has no rales.  Abdominal: Soft. Bowel sounds are normal. She exhibits no distension. There is no tenderness. There is no rebound.  Neurological: She is alert and oriented to person, place, and time.    Skin: Skin is warm and dry.  Small mole on stomach with some color shades, irregular border (per patient stable for years and no growth)  Psychiatric: She has a normal mood and affect. Her behavior is normal.    Filed Vitals:   06/26/14 1050  BP: 120/82  Pulse: 81  Temp: 98.1 F (36.7 C)  TempSrc: Oral  Resp: 14  Height: 5' 6"  (1.676 m)  Weight: 255 lb (115.667 kg)  SpO2: 92%      Assessment & Plan:

## 2014-06-27 NOTE — Assessment & Plan Note (Signed)
Colonoscopy due in 2019. Tetanus shot up to date. Mammogram up to date. Flu shot declined. Pap smear done but she is not sure what year. She will check.

## 2014-07-14 ENCOUNTER — Encounter: Payer: Self-pay | Admitting: Internal Medicine

## 2014-07-16 MED ORDER — ALPRAZOLAM 1 MG PO TABS: 0.5000 mg | ORAL_TABLET | Freq: Two times a day (BID) | ORAL | Status: DC

## 2014-07-30 ENCOUNTER — Ambulatory Visit (INDEPENDENT_AMBULATORY_CARE_PROVIDER_SITE_OTHER)
Admission: RE | Admit: 2014-07-30 | Discharge: 2014-07-30 | Disposition: A | Discharge status: Home / Self Care | Payer: 59 | Source: Ambulatory Visit | Attending: Internal Medicine | Admitting: Internal Medicine

## 2014-07-30 ENCOUNTER — Ambulatory Visit (INDEPENDENT_AMBULATORY_CARE_PROVIDER_SITE_OTHER): Payer: 59 | Admitting: Internal Medicine

## 2014-07-30 ENCOUNTER — Encounter: Payer: Self-pay | Admitting: Internal Medicine

## 2014-07-30 VITALS — BP 138/78 | HR 62 | Temp 97.8°F | Resp 16 | Ht 66.0 in | Wt 260.0 lb

## 2014-07-30 DIAGNOSIS — M7701 Medial epicondylitis, right elbow: Secondary | ICD-10-CM

## 2014-07-30 DIAGNOSIS — M7700 Medial epicondylitis, unspecified elbow: Secondary | ICD-10-CM | POA: Insufficient documentation

## 2014-07-30 DIAGNOSIS — M25521 Pain in right elbow: Secondary | ICD-10-CM

## 2014-07-30 DIAGNOSIS — M25529 Pain in unspecified elbow: Secondary | ICD-10-CM | POA: Insufficient documentation

## 2014-07-30 NOTE — Progress Notes (Signed)
Pre visit review using our clinic review tool, if applicable. No additional management support is needed unless otherwise documented below in the visit note. 

## 2014-07-30 NOTE — Patient Instructions (Signed)
Medial Epicondylitis (Golfer's Elbow) with Rehab Medial epicondylitis involves inflammation and pain around the inner (medial) portion of the elbow. This pain is caused by inflammation of the tendons in the forearm that flex (bring down) the wrist. Medial epicondylitis is also called golfer's elbow, because it is common among golfers. However, it may occur in any individual who flexes the wrist regularly. If medial epicondylitis is left untreated, it may become a chronic problem. SYMPTOMS   Pain, tenderness, or inflammation over the inner (medial) side of the elbow.  Pain or weakness with gripping activities.  Pain that increases with wrist twisting motions (using a screwdriver, playing golf, bowling). CAUSES  Medial epicondylitis is caused by inflammation of the tendons that flex the wrist. Causes of injury may include:  Chronic, repetitive stress and strain to the tendons that run from the wrist and forearm to the elbow.  Sudden strain on the forearm, including wrist snap when serving balls with racquet sports, or throwing a baseball. RISK INCREASES WITH:  Sports or occupations that require repetitive and/or strenuous forearm and wrist movements (pitching a baseball, golfing, carpentry).  Poor wrist and forearm strength and flexibility.  Failure to warm up properly before activity.  Resuming activity before healing, rehabilitation, and conditioning are complete. PREVENTION   Warm up and stretch properly before activity.  Maintain physical fitness:  Strength, flexibility, and endurance.  Cardiovascular fitness.  Wear and use properly fitted equipment.  Learn and use proper technique and have a coach correct improper technique.  Wear a tennis elbow (counterforce) brace. PROGNOSIS  The course of this condition depends on the degree of the injury. If treated properly, acute cases (symptoms lasting less than 4 weeks) are often resolved in 2 to 6 weeks. Chronic (longer lasting  cases) often resolve in 3 to 6 months, but may require physical therapy. RELATED COMPLICATIONS   Frequently recurring symptoms, resulting in a chronic problem. Properly treating the problem the first time decreases frequency of recurrence.  Chronic inflammation, scarring, and partial tendon tear, requiring surgery.  Delayed healing or resolution of symptoms. TREATMENT  Treatment first involves the use of ice and medicine, to reduce pain and inflammation. Strengthening and stretching exercises may reduce discomfort, if performed regularly. These exercises may be performed at home, if the condition is an acute injury. Chronic cases may require a referral to a physical therapist for evaluation and treatment. Your caregiver may advise a corticosteroid injection to help reduce inflammation. Rarely, surgery is needed. MEDICATION  If pain medicine is needed, nonsteroidal anti-inflammatory medicines (aspirin and ibuprofen), or other minor pain relievers (acetaminophen), are often advised.  Do not take pain medicine for 7 days before surgery.  Prescription pain relievers may be given, if your caregiver thinks they are needed. Use only as directed and only as much as you need.  Corticosteroid injections may be recommended. These injections should be reserved only for the most severe cases, because they can only be given a certain number of times. HEAT AND COLD  Cold treatment (icing) should be applied for 10 to 15 minutes every 2 to 3 hours for inflammation and pain, and immediately after activity that aggravates your symptoms. Use ice packs or an ice massage.  Heat treatment may be used before performing stretching and strengthening activities prescribed by your caregiver, physical therapist, or athletic trainer. Use a heat pack or a warm water soak. SEEK MEDICAL CARE IF: Symptoms get worse or do not improve in 2 weeks, despite treatment. EXERCISES  RANGE OF MOTION (  ROM) AND STRETCHING EXERCISES -  Epicondylitis, Medial (Golfer's Elbow) These exercises may help you when beginning to rehabilitate your injury. Your symptoms may go away with or without further involvement from your physician, physical therapist or athletic trainer. While completing these exercises, remember:   Restoring tissue flexibility helps normal motion to return to the joints. This allows healthier, less painful movement and activity.  An effective stretch should be held for at least 30 seconds.  A stretch should never be painful. You should only feel a gentle lengthening or release in the stretched tissue. RANGE OF MOTION - Wrist Flexion, Active-Assisted  Extend your right / left elbow with your fingers pointing down.*  Gently pull the back of your hand towards you, until you feel a gentle stretch on the top of your forearm.  Hold this position for __________ seconds. Repeat __________ times. Complete this exercise __________ times per day.  *If directed by your physician, physical therapist or athletic trainer, complete this stretch with your elbow bent, rather than extended. RANGE OF MOTION - Wrist Extension, Active-Assisted  Extend your right / left elbow and turn your palm upwards.*  Gently pull your palm and fingertips back, so your wrist extends and your fingers point more toward the ground.  You should feel a gentle stretch on the inside of your forearm.  Hold this position for __________ seconds. Repeat __________ times. Complete this exercise __________ times per day. *If directed by your physician, physical therapist or athletic trainer, complete this stretch with your elbow bent, rather than extended. STRETCH - Wrist Extension   Place your right / left fingertips on a tabletop leaving your elbow slightly bent. Your fingers should point backwards.  Gently press your fingers and palm down onto the table, by straightening your elbow. You should feel a stretch on the inside of your forearm.  Hold  this position for __________ seconds. Repeat __________ times. Complete this stretch __________ times per day.  STRENGTHENING EXERCISES - Epicondylitis, Medial (Golfer's Elbow) These exercises may help you when beginning to rehabilitate your injury. They may resolve your symptoms with or without further involvement from your physician, physical therapist or athletic trainer. While completing these exercises, remember:   Muscles can gain both the endurance and the strength needed for everyday activities through controlled exercises.  Complete these exercises as instructed by your physician, physical therapist or athletic trainer. Increase the resistance and repetitions only as guided.  You may experience muscle soreness or fatigue, but the pain or discomfort you are trying to eliminate should never worsen during these exercises. If this pain does get worse, stop and make sure you are following the directions exactly. If the pain is still present after adjustments, discontinue the exercise until you can discuss the trouble with your caregiver. STRENGTH - Wrist Flexors  Sit with your right / left forearm palm-up, and fully supported on a table or countertop. Your elbow should be resting below the height of your shoulder. Allow your wrist to extend over the edge of the surface.  Loosely holding a __________ weight, or a piece of rubber exercise band or tubing, slowly curl your hand up toward your forearm.  Hold this position for __________ seconds. Slowly lower the wrist back to the starting position in a controlled manner. Repeat __________ times. Complete this exercise __________ times per day.  STRENGTH - Wrist Extensors  Sit with your right / left forearm palm-down and fully supported. Your elbow should be resting below the height of your shoulder.  Allow your wrist to extend over the edge of the surface.  Loosely holding a __________ weight, or a piece of rubber exercise band or tubing, slowly  curl your hand up toward your forearm.  Hold this position for __________ seconds. Slowly lower the wrist back to the starting position in a controlled manner. Repeat __________ times. Complete this exercise __________ times per day.  STRENGTH - Ulnar Deviators  Stand with a ____________________ weight in your right / left hand, or sit while holding a rubber exercise band or tubing, with your healthy arm supported on a table or countertop.  Move your wrist so that your pinkie travels toward your forearm and your thumb moves away from your forearm.  Hold this position for __________ seconds and then slowly lower the wrist back to the starting position. Repeat __________ times. Complete this exercise __________ times per day STRENGTH - Grip   Grasp a tennis ball, a dense sponge, or a large, rolled sock in your hand.  Squeeze as hard as you can, without increasing any pain.  Hold this position for __________ seconds. Release your grip slowly. Repeat __________ times. Complete this exercise __________ times per day.  STRENGTH - Forearm Supinators   Sit with your right / left forearm supported on a table, keeping your elbow below shoulder height. Rest your hand over the edge, palm down.  Gently grip a hammer or a soup ladle.  Without moving your elbow, slowly turn your palm and hand upward to a "thumbs-up" position.  Hold this position for __________ seconds. Slowly return to the starting position. Repeat __________ times. Complete this exercise __________ times per day.  STRENGTH - Forearm Pronators  Sit with your right / left forearm supported on a table, keeping your elbow below shoulder height. Rest your hand over the edge, palm up.  Gently grip a hammer or a soup ladle.  Without moving your elbow, slowly turn your palm and hand upward to a "thumbs-up" position.  Hold this position for __________ seconds. Slowly return to the starting position. Repeat __________ times. Complete  this exercise __________ times per day.  Document Released: 04/25/2005 Document Revised: 07/18/2011 Document Reviewed: 08/07/2008 [facility] Patient Information 2015 [place], [place]. This information is not intended to replace advice given to you by your health care provider. Make sure you discuss any questions you have with your health care provider.

## 2014-07-31 NOTE — Progress Notes (Signed)
   Subjective:    Patient ID: Cassie [name], female    DOB: October 15, 1960, 54 y.o.   MRN: [id number]  HPI Comments: Right elbow pain for 4 days, she props a lot and has done some repetitive activities but does not recall any specific trauma or injury. She has been taking motrin which as provided some relief.     Review of Systems  Constitutional: Negative.  Negative for fever, chills, diaphoresis, appetite change and fatigue.  HENT: Negative.   Eyes: Negative.   Respiratory: Negative.   Cardiovascular: Negative.  Negative for leg swelling.  Gastrointestinal: Negative.  Negative for abdominal pain.  Endocrine: Negative.   Genitourinary: Negative.   Musculoskeletal: Positive for arthralgias.  Skin: Negative.  Negative for color change, pallor, rash and wound.  Allergic/Immunologic: Negative.   Neurological: Negative.   Hematological: Negative.  Negative for adenopathy. Does not bruise/bleed easily.  Psychiatric/Behavioral: Negative.        Objective:   Physical Exam  Musculoskeletal:       Right elbow: She exhibits normal range of motion, no swelling, no effusion, no deformity and no laceration. Tenderness found. Medial epicondyle tenderness noted. No radial head, no lateral epicondyle and no olecranon process tenderness noted.          Assessment & Plan:

## 2014-07-31 NOTE — Assessment & Plan Note (Addendum)
I tried to fit her into an elbow sleeve but it was too uncomfortable for her She will rest and ice She was given instructions on exercises Will cont nsaids but I have asked her to dose it higher and more frequently

## 2014-07-31 NOTE — Assessment & Plan Note (Signed)
Plain films shows DJD with mild spurring Will cont nsaids

## 2014-08-13 ENCOUNTER — Other Ambulatory Visit: Payer: Self-pay | Admitting: Internal Medicine

## 2014-09-18 ENCOUNTER — Telehealth: Payer: Self-pay | Admitting: Internal Medicine

## 2014-09-18 ENCOUNTER — Encounter (INDEPENDENT_AMBULATORY_CARE_PROVIDER_SITE_OTHER): Payer: Self-pay | Admitting: Ophthalmology

## 2014-09-18 NOTE — Telephone Encounter (Signed)
Received records from [facility] sent to Dr. Doug Sou 09/18/14 fbg

## 2014-09-19 ENCOUNTER — Encounter (INDEPENDENT_AMBULATORY_CARE_PROVIDER_SITE_OTHER): Payer: 59 | Admitting: Ophthalmology

## 2014-09-19 DIAGNOSIS — H33302 Unspecified retinal break, left eye: Secondary | ICD-10-CM | POA: Diagnosis not present

## 2014-09-19 DIAGNOSIS — H43813 Vitreous degeneration, bilateral: Secondary | ICD-10-CM

## 2014-09-19 DIAGNOSIS — H35412 Lattice degeneration of retina, left eye: Secondary | ICD-10-CM | POA: Diagnosis not present

## 2014-09-29 ENCOUNTER — Ambulatory Visit (INDEPENDENT_AMBULATORY_CARE_PROVIDER_SITE_OTHER): Payer: 59 | Admitting: Ophthalmology

## 2014-09-29 DIAGNOSIS — H33302 Unspecified retinal break, left eye: Secondary | ICD-10-CM

## 2014-10-07 ENCOUNTER — Other Ambulatory Visit: Payer: Self-pay | Admitting: Cardiovascular Disease

## 2014-10-13 ENCOUNTER — Encounter (INDEPENDENT_AMBULATORY_CARE_PROVIDER_SITE_OTHER): Payer: 59 | Admitting: Ophthalmology

## 2014-10-13 DIAGNOSIS — H33302 Unspecified retinal break, left eye: Secondary | ICD-10-CM | POA: Diagnosis not present

## 2014-10-13 DIAGNOSIS — H2513 Age-related nuclear cataract, bilateral: Secondary | ICD-10-CM

## 2014-10-13 DIAGNOSIS — H43813 Vitreous degeneration, bilateral: Secondary | ICD-10-CM

## 2014-10-14 ENCOUNTER — Encounter: Payer: Self-pay | Admitting: Internal Medicine

## 2014-10-20 ENCOUNTER — Encounter: Payer: Self-pay | Admitting: Internal Medicine

## 2014-10-22 ENCOUNTER — Ambulatory Visit: Payer: 59 | Admitting: Internal Medicine

## 2014-11-05 ENCOUNTER — Other Ambulatory Visit: Payer: Self-pay | Admitting: Geriatric Medicine

## 2014-11-05 ENCOUNTER — Encounter: Payer: Self-pay | Admitting: Internal Medicine

## 2014-11-05 MED ORDER — IBUPROFEN 600 MG PO TABS: ORAL_TABLET | ORAL | Status: DC

## 2014-11-08 ENCOUNTER — Encounter: Payer: Self-pay | Admitting: Internal Medicine

## 2014-11-11 MED ORDER — ALPRAZOLAM 1 MG PO TABS: 0.5000 mg | ORAL_TABLET | Freq: Two times a day (BID) | ORAL | Status: DC

## 2014-11-12 LAB — HM MAMMOGRAPHY

## 2014-11-17 ENCOUNTER — Emergency Department (HOSPITAL_BASED_OUTPATIENT_CLINIC_OR_DEPARTMENT_OTHER)
Admission: EM | Admit: 2014-11-17 | Discharge: 2014-11-17 | Disposition: A | Discharge status: Home / Self Care | Payer: 59 | Attending: Emergency Medicine | Admitting: Emergency Medicine

## 2014-11-17 ENCOUNTER — Encounter (HOSPITAL_BASED_OUTPATIENT_CLINIC_OR_DEPARTMENT_OTHER): Payer: Self-pay | Admitting: *Deleted

## 2014-11-17 DIAGNOSIS — Z87891 Personal history of nicotine dependence: Secondary | ICD-10-CM | POA: Insufficient documentation

## 2014-11-17 DIAGNOSIS — E538 Deficiency of other specified B group vitamins: Secondary | ICD-10-CM | POA: Insufficient documentation

## 2014-11-17 DIAGNOSIS — N189 Chronic kidney disease, unspecified: Secondary | ICD-10-CM | POA: Insufficient documentation

## 2014-11-17 DIAGNOSIS — Z9049 Acquired absence of other specified parts of digestive tract: Secondary | ICD-10-CM | POA: Diagnosis not present

## 2014-11-17 DIAGNOSIS — Z880 Allergy status to penicillin: Secondary | ICD-10-CM | POA: Diagnosis not present

## 2014-11-17 DIAGNOSIS — E559 Vitamin D deficiency, unspecified: Secondary | ICD-10-CM | POA: Insufficient documentation

## 2014-11-17 DIAGNOSIS — F410 Panic disorder [episodic paroxysmal anxiety] without agoraphobia: Secondary | ICD-10-CM | POA: Diagnosis not present

## 2014-11-17 DIAGNOSIS — K529 Noninfective gastroenteritis and colitis, unspecified: Secondary | ICD-10-CM | POA: Insufficient documentation

## 2014-11-17 DIAGNOSIS — R1084 Generalized abdominal pain: Secondary | ICD-10-CM | POA: Diagnosis present

## 2014-11-17 DIAGNOSIS — Z862 Personal history of diseases of the blood and blood-forming organs and certain disorders involving the immune mechanism: Secondary | ICD-10-CM | POA: Diagnosis not present

## 2014-11-17 DIAGNOSIS — Z79899 Other long term (current) drug therapy: Secondary | ICD-10-CM | POA: Insufficient documentation

## 2014-11-17 DIAGNOSIS — M1990 Unspecified osteoarthritis, unspecified site: Secondary | ICD-10-CM | POA: Insufficient documentation

## 2014-11-17 LAB — COMPREHENSIVE METABOLIC PANEL
ALK PHOS: 61 U/L (ref 38–126)
ALT: 17 U/L (ref 14–54)
AST: 19 U/L (ref 15–41)
Albumin: 3.6 g/dL (ref 3.5–5.0)
Anion gap: 9 (ref 5–15)
BUN: 8 mg/dL (ref 6–20)
CO2: 25 mmol/L (ref 22–32)
Calcium: 9 mg/dL (ref 8.9–10.3)
Chloride: 105 mmol/L (ref 101–111)
Creatinine, Ser: 0.92 mg/dL (ref 0.44–1.00)
GFR calc Af Amer: >60 mL/min (ref >60)
GFR calc non Af Amer: >60 mL/min (ref >60)
Glucose, Bld: 122 mg/dL — ABNORMAL HIGH (ref 65–99)
Potassium: 4.2 mmol/L (ref 3.5–5.1)
Sodium: 139 mmol/L (ref 135–145)
Total Bilirubin: 0.6 mg/dL (ref 0.3–1.2)
Total Protein: 7.5 g/dL (ref 6.5–8.1)

## 2014-11-17 LAB — URINALYSIS, ROUTINE W REFLEX MICROSCOPIC
Glucose, UA: NEGATIVE mg/dL
Ketones, ur: 15 mg/dL — AB
Nitrite: NEGATIVE
Protein, ur: NEGATIVE mg/dL
Specific Gravity, Urine: 1.026 (ref 1.005–1.030)
Urobilinogen, UA: 0.2 mg/dL (ref 0.0–1.0)
pH: 5.5 (ref 5.0–8.0)

## 2014-11-17 LAB — CBC WITH DIFFERENTIAL/PLATELET
Basophils Absolute: 0 10*3/uL (ref 0.0–0.1)
Basophils Relative: 0 % (ref 0–1)
Eosinophils Absolute: 0.1 10*3/uL (ref 0.0–0.7)
Eosinophils Relative: 1 % (ref 0–5)
HEMATOCRIT: 43.6 % (ref 36.0–46.0)
HEMOGLOBIN: 13.9 g/dL (ref 12.0–15.0)
Lymphocytes Relative: 10 % — ABNORMAL LOW (ref 12–46)
Lymphs Abs: 1.1 10*3/uL (ref 0.7–4.0)
MCH: 31.1 pg (ref 26.0–34.0)
MCHC: 31.9 g/dL (ref 30.0–36.0)
MCV: 97.5 fL (ref 78.0–100.0)
MONO ABS: 0.3 10*3/uL (ref 0.1–1.0)
Monocytes Relative: 3 % (ref 3–12)
NEUTROS ABS: 9.6 10*3/uL — AB (ref 1.7–7.7)
Neutrophils Relative %: 86 % — ABNORMAL HIGH (ref 43–77)
Platelets: 280 10*3/uL (ref 150–400)
RBC: 4.47 MIL/uL (ref 3.87–5.11)
RDW: 12.8 % (ref 11.5–15.5)
WBC: 11.1 10*3/uL — AB (ref 4.0–10.5)

## 2014-11-17 LAB — URINE MICROSCOPIC-ADD ON

## 2014-11-17 LAB — LIPASE, BLOOD: Lipase: 30 U/L (ref 22–51)

## 2014-11-17 MED ORDER — SODIUM CHLORIDE 0.9 % IV BOLUS (SEPSIS): 1000.0000 mL | Freq: Once | INTRAVENOUS | Status: DC

## 2014-11-17 NOTE — Discharge Instructions (Signed)
Fluids as tolerated.  Return to the emergency department if you develop severe abdominal pain, high fever, bloody stool, or other new and concerning symptoms.   Viral Gastroenteritis Viral gastroenteritis is also known as stomach flu. This condition affects the stomach and intestinal tract. It can cause sudden diarrhea and vomiting. The illness typically lasts 3 to 8 days. Most people develop an immune response that eventually gets rid of the virus. While this natural response develops, the virus can make you quite ill. CAUSES  Many different viruses can cause gastroenteritis, such as rotavirus or noroviruses. You can catch one of these viruses by consuming contaminated food or water. You may also catch a virus by sharing utensils or other personal items with an infected person or by touching a contaminated surface. SYMPTOMS  The most common symptoms are diarrhea and vomiting. These problems can cause a severe loss of body fluids (dehydration) and a body salt (electrolyte) imbalance. Other symptoms may include:  Fever.  Headache.  Fatigue.  Abdominal pain. DIAGNOSIS  Your caregiver can usually diagnose viral gastroenteritis based on your symptoms and a physical exam. A stool sample may also be taken to test for the presence of viruses or other infections. TREATMENT  This illness typically goes away on its own. Treatments are aimed at rehydration. The most serious cases of viral gastroenteritis involve vomiting so severely that you are not able to keep fluids down. In these cases, fluids must be given through an intravenous line (IV). HOME CARE INSTRUCTIONS   Drink enough fluids to keep your urine clear or pale yellow. Drink small amounts of fluids frequently and increase the amounts as tolerated.  Ask your caregiver for specific rehydration instructions.  Avoid:  Foods high in sugar.  Alcohol.  Carbonated drinks.  Tobacco.  Juice.  Caffeine drinks.  Extremely hot or cold  fluids.  Fatty, greasy foods.  Too much intake of anything at one time.  Dairy products until 24 to 48 hours after diarrhea stops.  You may consume probiotics. Probiotics are active cultures of beneficial bacteria. They may lessen the amount and number of diarrheal stools in adults. Probiotics can be found in yogurt with active cultures and in supplements.  Wash your hands well to avoid spreading the virus.  Only take over-the-counter or prescription medicines for pain, discomfort, or fever as directed by your caregiver. Do not give aspirin to children. Antidiarrheal medicines are not recommended.  Ask your caregiver if you should continue to take your regular prescribed and over-the-counter medicines.  Keep all follow-up appointments as directed by your caregiver. SEEK IMMEDIATE MEDICAL CARE IF:   You are unable to keep fluids down.  You do not urinate at least once every 6 to 8 hours.  You develop shortness of breath.  You notice blood in your stool or vomit. This may look like coffee grounds.  You have abdominal pain that increases or is concentrated in one small area (localized).  You have persistent vomiting or diarrhea.  You have a fever.  The patient is a child younger than 3 months, and he or she has a fever.  The patient is a child older than 3 months, and he or she has a fever and persistent symptoms.  The patient is a child older than 3 months, and he or she has a fever and symptoms suddenly get worse.  The patient is a baby, and he or she has no tears when crying. MAKE SURE YOU:   Understand these instructions.  Will  watch your condition.  Will get help right away if you are not doing well or get worse. Document Released: 04/25/2005 Document Revised: 07/18/2011 Document Reviewed: 02/09/2011 [facility] Patient Information 2015 [place], [place]. This information is not intended to replace advice given to you by your health care provider. Make sure you discuss  any questions you have with your health care provider.

## 2014-11-17 NOTE — ED Notes (Signed)
Pt c/o abd pain x 2 days  With diarrhea

## 2014-11-17 NOTE — ED Notes (Signed)
Patient refusing IV at present.  Explained to patient the necessity of having IV.  Patient voiced understanding but states she would like to hold on IV unless absolutely necessary.  MD made aware.

## 2014-11-17 NOTE — ED Provider Notes (Signed)
CSN: [id number]     Arrival date & time 11/17/14  2110 History   This chart was scribed for Veryl Speak, MD by Helane Gunther, ED Scribe. This patient was seen in room MH10/MH10 and the patient's care was started at 10:16 PM.    Chief Complaint  Patient presents with  . Abdominal Pain   Patient is a 54 y.o. female presenting with abdominal pain. The history is provided by the patient. No language interpreter was used.  Abdominal Pain Pain location:  Generalized Pain quality: cramping   Pain radiates to:  Does not radiate Pain severity:  Moderate Onset quality:  Sudden Duration:  2 days Timing:  Intermittent Progression:  Unchanged Chronicity:  New Relieved by:  Nothing Worsened by:  Nothing tried Ineffective treatments:  OTC medications Associated symptoms: diarrhea and nausea   Associated symptoms: no dysuria, no fever and no vomiting    HPI Comments: Cassie [name] is a 54 y.o. female who presents to the Emergency Department complaining of intermittent, cramping, diffuse abdominal pain onset 2 days ago. She reports associated nausea, and 3x diarrhea 1 day ago. She took some Pepto bismol with some relief. She has a PMHx of ulcerative Colitis. She states that this does not feel like a usual flare-up. She has a PSHx of cholecystectomy 1 year ago, as well as a recent eye surgery for cataracts. She denies back pain, vomiting, bloody stool and dysuria.   Past Medical History  Diagnosis Date  . Anxiety   . Panic disorder   . Other allergy, other than to medicinal agents   . Ulcerative colitis, unspecified   . Morbid obesity   . Vitamin B12 deficiency   . Vitamin D deficiency   . Cardiac arrhythmia     palpitations,PVC'sAC  . Chronic kidney disease     cyst right kidney  . Arthritis     knees,thumbs  . Anemia     20 yrs. ago not now  . Adrenal nodule   . Gallstones    Past Surgical History  Procedure Laterality Date  . Breast biopsy  11/2011    Left- benign  .  Colonoscopy    . Sigmoidoscopy    . Cholecystectomy N/A 01/17/2014    Procedure: LAPAROSCOPIC CHOLECYSTECTOMY;  Surgeon: Excell Seltzer, MD;  Location: [facility];  Service: General;  Laterality: N/A;   Family History  Problem Relation Age of Onset  . Heart disease Father 34    pacemaker  . Atrial fibrillation Father   . Colon cancer Neg Hx   . Stomach cancer Neg Hx    History  Substance Use Topics  . Smoking status: Former Smoker    Types: Cigarettes    Quit date: 03/07/1979  . Smokeless tobacco: Never Used  . Alcohol Use: No   OB History    No data available     Review of Systems  Constitutional: Negative for fever.  Gastrointestinal: Positive for nausea, abdominal pain and diarrhea. Negative for vomiting and blood in stool.  Genitourinary: Negative for dysuria.  Musculoskeletal: Negative for back pain.  All other systems reviewed and are negative.   Allergies  Doxycycline; Sudafed; Diphenhydramine hcl; Mesalamine; Sulfasalazine; Caffeine; Penicillins; and Prednisone  Home Medications   Prior to Admission medications   Medication Sig Start Date End Date Taking? Authorizing Provider  ALPRAZolam Duanne Moron) 1 MG tablet Take 0.5-1 tablets (0.5-1 mg total) by mouth 2 (two) times daily. She takes half a tablet in the morning and one tablet at bedtime. 11/11/14  Olga Millers, MD  atenolol (TENORMIN) 50 MG tablet TAKE 1/2 TABLET BY MOUTH TWICE DAILY 10/08/14   Josue Hector, MD  cholecalciferol (VITAMIN D) 1000 UNITS tablet Take 2,000 Units by mouth daily with lunch.     Historical Provider, MD  ibuprofen (ADVIL,MOTRIN) 600 MG tablet TAKE ONE TABLET BY MOUTH THREE TIMES DAILY WITH FOOD AS NEEDED 11/05/14   Olga Millers, MD  JINTELI 1-5 MG-MCG TABS 1 tablet daily.  02/26/14   Historical Provider, MD  loperamide (IMODIUM A-D) 2 MG tablet Take 2 mg by mouth 4 (four) times daily as needed for diarrhea or loose stools.    Historical Provider, MD  Probiotic Product (PROBIOTIC  DAILY PO) Take 2 capsules by mouth daily with lunch.     Historical Provider, MD  Simethicone (GAS-X PO) Take 1 tablet by mouth 4 (four) times daily as needed (For gas.). Do not exceed 6 tablets in a 24 hour period.    Historical Provider, MD  vitamin B-12 (CYANOCOBALAMIN) 1000 MCG tablet Take 1 tablet (1,000 mcg total) by mouth daily. 06/27/14   Olga Millers, MD   BP 116/47 mmHg  Pulse 64  Temp(Src) 98.4 F (36.9 C) (Oral)  Resp 16  Wt 260 lb (117.935 kg)  SpO2 100% Physical Exam  Constitutional: She is oriented to person, place, and time. She appears well-developed and well-nourished. No distress.  HENT:  Head: Normocephalic and atraumatic.  Mouth/Throat: Oropharynx is clear and moist.  Eyes: Conjunctivae and EOM are normal. Pupils are equal, round, and reactive to light.  Neck: Normal range of motion. Neck supple. No tracheal deviation present.  Cardiovascular: Normal rate, regular rhythm and normal heart sounds.   Pulmonary/Chest: Effort normal and breath sounds normal. No respiratory distress.  Abdominal: Soft. Bowel sounds are normal. She exhibits no distension. There is tenderness. There is no rebound and no guarding.  Mild TTP in all 4 quadrants.  Musculoskeletal: Normal range of motion.  Neurological: She is alert and oriented to person, place, and time.  Skin: Skin is warm and dry.  Psychiatric: She has a normal mood and affect. Her behavior is normal.  Nursing note and vitals reviewed.   ED Course  Procedures  DIAGNOSTIC STUDIES: Oxygen Saturation is 100% on RA, normal by my interpretation.    COORDINATION OF CARE: 10:24 PM - Discussed plans to order IV fluids and diagnostic studies. Pt advised of plan for treatment and pt agrees.  Labs Review Labs Reviewed  URINALYSIS, ROUTINE W REFLEX MICROSCOPIC (NOT AT [facility]) - Abnormal; Notable for the following:    Color, Urine AMBER (*)    APPearance CLOUDY (*)    Hgb urine dipstick MODERATE (*)    Bilirubin Urine  SMALL (*)    Ketones, ur 15 (*)    Leukocytes, UA MODERATE (*)    All other components within normal limits  URINE MICROSCOPIC-ADD ON - Abnormal; Notable for the following:    Squamous Epithelial / LPF FEW (*)    Bacteria, UA FEW (*)    All other components within normal limits    Imaging Review No results found.   EKG Interpretation None      MDM   Final diagnoses:  None    Patient presents with abdominal cramping and loose stools which started earlier today. Her diarrhea is nonbloody and she is not having any vomiting. She has no fevers and her white count is 11,000. Electrolytes and LFTs are unremarkable. Her presentation, exam, and workup are consistent  with a viral gastroenteritis. Patient is declining IV fluids. She will be discharged to home with instructions to return as needed if she develops worsening pain, bloody stool, high fever, or other new and concerning symptoms.  I personally performed the services described in this documentation, which was scribed in my presence. The recorded information has been reviewed and is accurate.     Veryl Speak, MD 11/17/14 2330

## 2014-11-18 ENCOUNTER — Ambulatory Visit (INDEPENDENT_AMBULATORY_CARE_PROVIDER_SITE_OTHER): Payer: 59 | Admitting: Gastroenterology

## 2014-11-18 ENCOUNTER — Telehealth: Payer: Self-pay | Admitting: Internal Medicine

## 2014-11-18 ENCOUNTER — Encounter: Payer: Self-pay | Admitting: Gastroenterology

## 2014-11-18 VITALS — BP 120/82 | HR 60 | Ht 64.5 in | Wt 263.5 lb

## 2014-11-18 DIAGNOSIS — K5190 Ulcerative colitis, unspecified, without complications: Secondary | ICD-10-CM | POA: Diagnosis not present

## 2014-11-18 DIAGNOSIS — R197 Diarrhea, unspecified: Secondary | ICD-10-CM

## 2014-11-18 DIAGNOSIS — R109 Unspecified abdominal pain: Secondary | ICD-10-CM

## 2014-11-18 MED ORDER — DICYCLOMINE HCL 10 MG PO CAPS: 10.0000 mg | ORAL_CAPSULE | Freq: Two times a day (BID) | ORAL | Status: DC

## 2014-11-18 NOTE — Progress Notes (Signed)
     11/18/2014 Cassie [name] [id number] December 30, 1960   History of Present Illness:  This is a 54 year old female who is known to Dr. Olevia Perches.  She has a longstanding history of ulcerative colitis. She is untreated due to intolerance of medication.  Patient comes in today for evaluation of abdominal pain and diarrhea that began on Sunday night.  She says that this does not feel like her UC and rather the pain is more reminiscent of gallbladder attacks that she had prior to her cholecystectomy.  She says that she has been visiting her father in the hospital a lot and wanted to make sure that there was not something else going on.  Has taken Imodium and pepto-bismol.  Denies seeing any blood in her stool.  She went to the ED where they told her it may be a gastroenteritis.  CBC showed slightly elevated WBC but was otherwise normal.  Lipase and CMP normal.  Last colonoscopy Nov 2014 with findings of left sided colitis, moderate colitis of transverse colon, and severe colitis at splenic flexure; rectum spared.  Patient not tolerant / allergic to IBD meds.  Due for colonoscopy 03/2016.  Current Medications, Allergies, Past Medical History, Past Surgical History, Family History and Social History were reviewed in [employer] record.   Physical Exam: BP 120/82 mmHg  Pulse 60  Ht 5' 4.5" (1.638 m)  Wt 263 lb 8 oz (119.523 kg)  BMI 44.55 kg/m2 General: Well developed white female in no acute distress Head: Normocephalic and atraumatic Eyes:  Sclerae anicteric, conjunctiva pink  Ears: Normal auditory acuity Lungs: Clear throughout to auscultation Heart: Regular rate and rhythm Abdomen: Soft, non-distended.  Hyperactive bowel sounds.  Minimal diffuse TTP without R/R/G. Musculoskeletal: Symmetrical with no gross deformities  Extremities: No edema  Neurological: Alert oriented x 4, grossly non-focal Psychological:  Alert and cooperative. Normal mood and affect  Assessment  and Recommendations: -Acute onset diarrhea and abdominal pain in a patient with untreated long-standing UC:  Sounds infectious, possibly viral, but will check stool studies particularly to rule out Cdiff.  Will drink a lot of fluids and eat a bland diet as tolerated.  will try Bentyl 10 mg BID for the abdominal pain.

## 2014-11-18 NOTE — Patient Instructions (Signed)
Go to the basement for labs today We have sent your prescription to your pharmacy

## 2014-11-18 NOTE — Telephone Encounter (Signed)
Pt states she started having abdominal pain and cramping Sunday. States her father has been in the hospital and she has been spending a lot of time there. Pt went to [facility] last night due to the pain and diarrhea. States they were not able to find anything. Pt requesting to be seen. Pt scheduled to see Alonza Bogus PA today at 2pm. Pt aware of appt.

## 2014-11-19 ENCOUNTER — Other Ambulatory Visit: Payer: 59

## 2014-11-19 DIAGNOSIS — R197 Diarrhea, unspecified: Secondary | ICD-10-CM

## 2014-11-19 DIAGNOSIS — R109 Unspecified abdominal pain: Secondary | ICD-10-CM

## 2014-11-20 NOTE — Progress Notes (Signed)
Reviewed and agree with symptomatic treatment. If diarrhea continues, she will need sed.rate  CBC etc.Difficuth to treat due to  Intolerance to meds

## 2014-11-26 LAB — OTHER SOLSTAS TEST: MISCELLANEOUS TEST: 65008

## 2014-11-27 ENCOUNTER — Encounter: Payer: Self-pay | Admitting: Internal Medicine

## 2014-12-21 ENCOUNTER — Encounter: Payer: Self-pay | Admitting: Internal Medicine

## 2014-12-25 ENCOUNTER — Ambulatory Visit (INDEPENDENT_AMBULATORY_CARE_PROVIDER_SITE_OTHER)
Admission: RE | Admit: 2014-12-25 | Discharge: 2014-12-25 | Disposition: A | Discharge status: Home / Self Care | Payer: 59 | Source: Ambulatory Visit | Attending: Internal Medicine | Admitting: Internal Medicine

## 2014-12-25 ENCOUNTER — Ambulatory Visit (INDEPENDENT_AMBULATORY_CARE_PROVIDER_SITE_OTHER): Payer: 59 | Admitting: Internal Medicine

## 2014-12-25 ENCOUNTER — Encounter: Payer: Self-pay | Admitting: Internal Medicine

## 2014-12-25 ENCOUNTER — Other Ambulatory Visit (INDEPENDENT_AMBULATORY_CARE_PROVIDER_SITE_OTHER): Payer: 59

## 2014-12-25 VITALS — BP 122/72 | HR 65 | Temp 98.7°F | Resp 16 | Ht 65.0 in | Wt 268.0 lb

## 2014-12-25 DIAGNOSIS — E538 Deficiency of other specified B group vitamins: Secondary | ICD-10-CM | POA: Diagnosis not present

## 2014-12-25 DIAGNOSIS — M25532 Pain in left wrist: Secondary | ICD-10-CM

## 2014-12-25 DIAGNOSIS — E559 Vitamin D deficiency, unspecified: Secondary | ICD-10-CM

## 2014-12-25 LAB — VITAMIN B12: VITAMIN B 12: 584 pg/mL (ref 211–911)

## 2014-12-25 LAB — VITAMIN D 25 HYDROXY (VIT D DEFICIENCY, FRACTURES): VITD: 26.39 ng/mL — AB (ref 30.00–100.00)

## 2014-12-25 MED ORDER — IBUPROFEN 600 MG PO TABS: ORAL_TABLET | ORAL | Status: DC

## 2014-12-25 MED ORDER — ALPRAZOLAM 1 MG PO TABS: 0.5000 mg | ORAL_TABLET | Freq: Two times a day (BID) | ORAL | Status: DC

## 2014-12-25 NOTE — Assessment & Plan Note (Signed)
Checking x-ray of the wrist. Concern given her UC and recurrent steroid courses that she could have stress fracture. Continue with ice or ibuprofen for pain.

## 2014-12-25 NOTE — Progress Notes (Signed)
   Subjective:    Patient ID: Cassie [name], female    DOB: 08-04-1960, 54 y.o.   MRN: [id number]  HPI The patient is a 54 YO female coming in for left wrist pain. She denies injury to the area. Awoke with the pain several days ago. Rates as 10/10 when severe. She is using ice and ibuprofen to the area which is controlling the pain. She has had similar episodes to the shoulder and knee which passed in a couple days.   Review of Systems  Constitutional: Negative for fever, chills, activity change, appetite change, fatigue and unexpected weight change.  Respiratory: Negative for cough, chest tightness, shortness of breath and wheezing.        Mild  Cardiovascular: Negative for chest pain, palpitations and leg swelling.  Gastrointestinal: Negative for abdominal pain, diarrhea, constipation and abdominal distention.  Musculoskeletal: Positive for myalgias and arthralgias. Negative for back pain and gait problem.  Skin: Negative for pallor, rash and wound.  Neurological: Negative.   Psychiatric/Behavioral: Positive for dysphoric mood. Negative for behavioral problems and decreased concentration.      Objective:   Physical Exam  Constitutional: She is oriented to person, place, and time. She appears well-developed and well-nourished.  HENT:  Head: Normocephalic and atraumatic.  Eyes: EOM are normal.  Neck: Normal range of motion.  Cardiovascular: Normal rate and regular rhythm.   No murmur heard. Pulmonary/Chest: Effort normal and breath sounds normal. No respiratory distress. She has no wheezes. She has no rales.  Abdominal: Soft. Bowel sounds are normal. She exhibits no distension. There is no tenderness. There is no rebound.  Musculoskeletal: She exhibits tenderness.  Tenderness along the radial aspect of the wrist, pain with flexion and extension of the wrist.   Neurological: She is alert and oriented to person, place, and time.  Skin: Skin is warm and dry.  Psychiatric: She has a  normal mood and affect. Her behavior is normal.   Filed Vitals:   12/25/14 0805  BP: 122/72  Pulse: 65  Temp: 98.7 F (37.1 C)  TempSrc: Oral  Resp: 16  Height: 5' 5"  (1.651 m)  Weight: 268 lb (121.564 kg)  SpO2: 98%      Assessment & Plan:

## 2014-12-25 NOTE — Progress Notes (Signed)
Pre visit review using our clinic review tool, if applicable. No additional management support is needed unless otherwise documented below in the visit note. 

## 2014-12-25 NOTE — Assessment & Plan Note (Signed)
Checking levels today. Is currently on oral replacement.

## 2014-12-25 NOTE — Assessment & Plan Note (Signed)
Checking levels today, currently on oral replacement.

## 2014-12-25 NOTE — Patient Instructions (Signed)
We are going to check an x-ray of the wrist as well as lab tests today.   We will call you back with the results. In the meantime with the wrist keep using the ice on it and you can take the ibuprofen or tylenol for pain.

## 2015-01-23 ENCOUNTER — Encounter: Payer: Self-pay | Admitting: Nurse Practitioner

## 2015-01-23 ENCOUNTER — Encounter: Payer: Self-pay | Admitting: Internal Medicine

## 2015-01-23 ENCOUNTER — Telehealth: Payer: Self-pay | Admitting: Internal Medicine

## 2015-01-28 ENCOUNTER — Ambulatory Visit (INDEPENDENT_AMBULATORY_CARE_PROVIDER_SITE_OTHER): Payer: 59 | Admitting: Gastroenterology

## 2015-01-28 ENCOUNTER — Encounter: Payer: Self-pay | Admitting: Gastroenterology

## 2015-01-28 ENCOUNTER — Other Ambulatory Visit (INDEPENDENT_AMBULATORY_CARE_PROVIDER_SITE_OTHER): Payer: 59

## 2015-01-28 VITALS — BP 120/78 | HR 68 | Ht 64.5 in | Wt 269.2 lb

## 2015-01-28 DIAGNOSIS — R102 Pelvic and perineal pain: Secondary | ICD-10-CM | POA: Insufficient documentation

## 2015-01-28 DIAGNOSIS — K5190 Ulcerative colitis, unspecified, without complications: Secondary | ICD-10-CM | POA: Insufficient documentation

## 2015-01-28 DIAGNOSIS — K51919 Ulcerative colitis, unspecified with unspecified complications: Secondary | ICD-10-CM

## 2015-01-28 DIAGNOSIS — R1010 Upper abdominal pain, unspecified: Secondary | ICD-10-CM | POA: Diagnosis not present

## 2015-01-28 LAB — CBC WITH DIFFERENTIAL/PLATELET
BASOS ABS: 0 10*3/uL (ref 0.0–0.1)
Basophils Relative: 0 % (ref 0.0–3.0)
Eosinophils Absolute: 0.2 10*3/uL (ref 0.0–0.7)
Eosinophils Relative: 2.5 % (ref 0.0–5.0)
HEMATOCRIT: 41.3 % (ref 36.0–46.0)
HEMOGLOBIN: 13.6 g/dL (ref 12.0–15.0)
LYMPHS PCT: 14.7 % (ref 12.0–46.0)
Lymphs Abs: 1.3 10*3/uL (ref 0.7–4.0)
MCHC: 33 g/dL (ref 30.0–36.0)
MCV: 95.4 fl (ref 78.0–100.0)
MONOS PCT: 5.1 % (ref 3.0–12.0)
Monocytes Absolute: 0.5 10*3/uL (ref 0.1–1.0)
NEUTROS ABS: 7 10*3/uL (ref 1.4–7.7)
Neutrophils Relative %: 77.7 % — ABNORMAL HIGH (ref 43.0–77.0)
PLATELETS: 280 10*3/uL (ref 150.0–400.0)
RBC: 4.33 Mil/uL (ref 3.87–5.11)
RDW: 13.9 % (ref 11.5–15.5)
WBC: 9 10*3/uL (ref 4.0–10.5)

## 2015-01-28 LAB — BASIC METABOLIC PANEL
BUN: 10 mg/dL (ref 6–23)
CO2: 30 mEq/L (ref 19–32)
CREATININE: 0.84 mg/dL (ref 0.40–1.20)
Calcium: 9.1 mg/dL (ref 8.4–10.5)
Chloride: 106 mEq/L (ref 96–112)
GFR: 74.58 mL/min (ref >60.00)
GLUCOSE: 101 mg/dL — AB (ref 70–99)
POTASSIUM: 3.9 meq/L (ref 3.5–5.1)
Sodium: 141 mEq/L (ref 135–145)

## 2015-01-28 LAB — SEDIMENTATION RATE: SED RATE: 39 mm/h — AB (ref 0–22)

## 2015-01-28 LAB — HIGH SENSITIVITY CRP: CRP, High Sensitivity: 18.6 mg/L — ABNORMAL HIGH (ref 0.000–5.000)

## 2015-01-28 NOTE — Patient Instructions (Addendum)
Please go to the basement level to have your labs drawn.  You have been scheduled for an abdominal ultrasound at [facility] (1st floor of hospital) on 02-02-2015 at 9:00 am . Please arrive at 8:45 am prior to your appointment for registration. Make certain not to have anything to eat or drink  After midnight . Should you need to reschedule your appointment, please contact radiology at [phone]. This test typically takes about 30 minutes to perform for one Ultrasound, this may take longer due to doing both the Abdomen and pelvis.           Normal BMI (Body Mass Index- based on height and weight) is between 19 and 25. Your BMI today is Body mass index is 45.52 kg/(m^2). Marland Kitchen Please consider follow up  regarding your BMI with your Primary Care Provider.

## 2015-01-28 NOTE — Progress Notes (Addendum)
     01/28/2015 Cassie [name] [id number] 06-23-1960   History of Present Illness:  This is a 54 year old female who is previously known to Dr. Olevia Perches.  She has a longstanding history of ulcerative colitis. She is untreated due to intolerance of medication and resistance to try other medications due to problems with side effects in the past.  She is requesting Dr. Hilarie Fredrickson for her future primary MD.  Roney Jaffe that she wants someone who is not going to "push medications" on her.  She comes in today with complaints of lower abdominal/pelvic pain.  Saw her GYN and is being treated for a mild yeast infection.  Urine studies at their office were negative according to her report.  No exam or imaging performed there but patient says that GYN does not think that pain is GYN related.  Pain was moderate in intensity when it began about one or so months ago, but has improved.  Says that it does not hurt to push on abdomen.  Denies nausea, vomiting, diarrhea, fevers, etc.  Is eating well.  She is requesting labs including inflammatory markers even though she tells me that they are always elevated.    Last colonoscopy Nov 2014 with findings of left sided colitis, moderate colitis of transverse colon, and severe colitis at splenic flexure; rectum spared.  Patient not tolerant/allergic to IBD meds.  Due for colonoscopy 03/2016.   Current Medications, Allergies, Past Medical History, Past Surgical History, Family History and Social History were reviewed in [employer] record.   Physical Exam: BP 120/78 mmHg  Pulse 68  Ht 5' 4.5" (1.638 m)  Wt 269 lb 4 oz (122.131 kg)  BMI 45.52 kg/m2 General: Well developed white female in no acute distress Head: Normocephalic and atraumatic Eyes:  Sclerae anicteric, conjunctiva pink  Ears: Normal auditory acuity Lungs: Clear throughout to auscultation Heart: Regular rate and rhythm Abdomen: Soft, non-distended.  Normal bowel sounds.   Non-tender. Musculoskeletal: Symmetrical with no gross deformities  Extremities: No edema  Neurological: Alert oriented x 4, grossly non-focal Psychological:  Alert and cooperative. Normal mood and affect  Assessment and Recommendations: -Lower abdominal/pelvic pain in a patient with untreated long-standing UC:  No other associated GI symptoms.  U/A reportedly negative at GYN and being treated for mild yeast infection.  Will perform abdominal and pelvic ultrasounds for patient reassurance.  Also will check labs at patient request including CBC, BMP, sed rate, and CRP although I expect the inflammatory markers to be high with her untreated disease.  ? If her pain is related to her UC but even if so then what is the plan of treatment since she is resistant to take meds and intolerant to several meds.  Will try Bentyl prn to see if this helps (she has this at home when she previously had complaints of abdominal pain).  Will need follow-up appt with new attending MD.  Patient requesting Dr. Hilarie Fredrickson.  Will see if he is able to assume this patient's care.  Addendum: Reviewed and agree with initial management. Needs office follow-up with me next available Concern is untreated IBD which is chronic and unlikely to resolve without dedicated treatment.  This is something that needs to be discussed in the office Jerene Bears, MD

## 2015-01-30 ENCOUNTER — Ambulatory Visit (INDEPENDENT_AMBULATORY_CARE_PROVIDER_SITE_OTHER): Payer: 59 | Admitting: Ophthalmology

## 2015-02-02 ENCOUNTER — Ambulatory Visit (HOSPITAL_COMMUNITY)
Admission: RE | Admit: 2015-02-02 | Discharge: 2015-02-02 | Disposition: A | Discharge status: Home / Self Care | Payer: 59 | Source: Ambulatory Visit | Attending: Gastroenterology | Admitting: Gastroenterology

## 2015-02-02 DIAGNOSIS — K51919 Ulcerative colitis, unspecified with unspecified complications: Secondary | ICD-10-CM

## 2015-02-02 DIAGNOSIS — R1010 Upper abdominal pain, unspecified: Secondary | ICD-10-CM

## 2015-02-02 DIAGNOSIS — R102 Pelvic and perineal pain: Secondary | ICD-10-CM | POA: Diagnosis not present

## 2015-02-02 DIAGNOSIS — Z9049 Acquired absence of other specified parts of digestive tract: Secondary | ICD-10-CM | POA: Diagnosis not present

## 2015-02-02 DIAGNOSIS — M545 Low back pain: Secondary | ICD-10-CM | POA: Insufficient documentation

## 2015-02-03 ENCOUNTER — Encounter: Payer: Self-pay | Admitting: Gastroenterology

## 2015-02-12 ENCOUNTER — Ambulatory Visit (INDEPENDENT_AMBULATORY_CARE_PROVIDER_SITE_OTHER): Payer: 59 | Admitting: Ophthalmology

## 2015-02-16 ENCOUNTER — Ambulatory Visit (INDEPENDENT_AMBULATORY_CARE_PROVIDER_SITE_OTHER): Payer: 59 | Admitting: Ophthalmology

## 2015-02-26 ENCOUNTER — Ambulatory Visit (INDEPENDENT_AMBULATORY_CARE_PROVIDER_SITE_OTHER): Payer: 59 | Admitting: Ophthalmology

## 2015-02-26 DIAGNOSIS — H43813 Vitreous degeneration, bilateral: Secondary | ICD-10-CM

## 2015-02-26 DIAGNOSIS — H33302 Unspecified retinal break, left eye: Secondary | ICD-10-CM | POA: Diagnosis not present

## 2015-03-06 ENCOUNTER — Encounter: Payer: Self-pay | Admitting: Gastroenterology

## 2015-03-13 ENCOUNTER — Encounter: Payer: Self-pay | Admitting: Gastroenterology

## 2015-03-25 ENCOUNTER — Telehealth: Payer: Self-pay | Admitting: Internal Medicine

## 2015-03-25 NOTE — Telephone Encounter (Signed)
No, but I will discuss with her further tomorrow

## 2015-03-25 NOTE — Telephone Encounter (Signed)
Pt called asking if we need to send in a pill for her to take in the morning before she comes in for her one yr f/up? Does she need labs? Please advise.

## 2015-03-25 NOTE — Telephone Encounter (Signed)
Pt is requesting help to see if we are to rx her the pill she has taken in the past for her one yr fu tomorrow

## 2015-03-25 NOTE — Telephone Encounter (Signed)
Called pt and advised her per Dr Gherghe's message. Pt voiced understanding.  

## 2015-03-26 ENCOUNTER — Encounter: Payer: Self-pay | Admitting: Internal Medicine

## 2015-03-26 ENCOUNTER — Ambulatory Visit (INDEPENDENT_AMBULATORY_CARE_PROVIDER_SITE_OTHER): Payer: 59 | Admitting: Internal Medicine

## 2015-03-26 VITALS — BP 124/82 | HR 98 | Temp 98.3°F | Resp 12 | Wt 272.6 lb

## 2015-03-26 DIAGNOSIS — E278 Other specified disorders of adrenal gland: Secondary | ICD-10-CM

## 2015-03-26 LAB — POTASSIUM: Potassium: 4.2 mEq/L (ref 3.5–5.1)

## 2015-03-26 MED ORDER — DEXAMETHASONE 1 MG PO TABS: ORAL_TABLET | ORAL | Status: DC

## 2015-03-26 NOTE — Progress Notes (Signed)
Patient ID: Cassie [name], female   DOB: 02-26-1961, 54 y.o.   MRN: [id number]   HPI  Cassie [name] is a 54 y.o.-year-old female, initially referred by Dr. Delfin Edis, for evaluation and management bilateral adrenal incidentaloma. She is here with her husband who offers part of the hx.  Reviewed hx and imaging scan reports along with the pt: Patient was incidentally found to have 2 adrenal nodules on a CT scan obtained before her colonoscopy from 2013. I reviewed pt's previous CT scan images from 02/10/2012: 31.5 HU nodule in the left adrenal gland measures 1.1 x 1.7 cm. Left adrenal nodule measures 9.5 HU nodule in the right adrenal gland measures 1 cm. Pt is worried especially about her Right adrenal nodule as it is more intense.  Abdominal MRI with focus on the adrenal (05/2013) showed stable appearance of the adrenal nodules.  Patient had an abdominal CT scan with contrast for investigation of diarrhea and abdominal cramping (11/2013), and this confirmed stable adrenal nodules consistent with adenomas. She had GB surgery soon after this.   The entire adrenal hormonal workup was normal in 2014 and 2015: Component     Latest Ref Rng 03/25/2013 04/09/2013  Epinephrine      45   Norepinephrine      365   Dopamine      REPORT   Catecholamines, Total      410   PRA LC/MS/MS     0.25 - 5.82 ng/mL/h 3.69   ALDO / PRA Ratio     0.9 - 28.9 Ratio 2.4   ALDOSTERONE      9   Metanephrine, Free     <=57 pg/mL <25   Normetanephrine, Free     <=148 pg/mL 48   Total Metanephrines-Plasma     <=205 pg/mL 48   Potassium     3.5 - 5.1 mEq/L 4.2   Cortisol, Plasma       2.6  Dexamethasone, Serum       550   Component     Latest Ref Rng 03/25/2014 04/01/2014  Sodium     135 - 145 mEq/L 140   Potassium     3.5 - 5.1 mEq/L 4.1   Chloride     96 - 112 mEq/L 109   CO2     19 - 32 mEq/L 26   Glucose     70 - 99 mg/dL 93   BUN     6 - 23 mg/dL 11   Creatinine     0.4 - 1.2 mg/dL  0.9   Calcium     8.4 - 10.5 mg/dL 9.1   GFR     >60.00 mL/min 69.09   Epinephrine      38   Norepinephrine      300   Dopamine      REPORT   Catecholamines, Total      338   PRA LC/MS/MS     0.25 - 5.82 ng/mL/h 4.07   ALDO / PRA Ratio     0.9 - 28.9 Ratio 1.0   ALDOSTERONE      4   Metanephrine, Free     <=57 pg/mL 27   Normetanephrine, Free     <=148 pg/mL 59   Total Metanephrines-Plasma     <=205 pg/mL 86   Cortisol, Plasma       2.2   She does not have HTN. Occasional palpitations. She is on Atenolol 25 mg bid.   She  lost 23 lbs in 2015 - by changing the diet and reducing portion size. Previously, she lost ~60 lbs.  Has UC. She has been on Prednisone for the UC years ago and also got inj with steroids in the past.   She has thinning hair - androgenic alopecia  - for the last 10 years.    ROS: Constitutional: see HPI Eyes: no blurry vision, no xerophthalmia ENT: no sore throat, no nodules palpated in throat, no dysphagia/odynophagia, no hoarseness Cardiovascular: no CP/SOB/+ palpitations/no leg swelling Respiratory: no cough/SOB Gastrointestinal: no N/V/D/no C Musculoskeletal: no muscle/joint aches Skin: no rashes, + hair loss Neurological: + tremors/no numbness/tingling/dizziness, + HA  I reviewed pt's medications, allergies, PMH, social hx, family hx, and changes were documented in the history of present illness. Otherwise, unchanged from my initial visit note.  PE: BP 124/82 mmHg  Pulse 98  Temp(Src) 98.3 F (36.8 C) (Oral)  Resp 12  Wt 272 lb 9.6 oz (123.651 kg)  SpO2 97% Body mass index is 46.09 kg/(m^2). Wt Readings from Last 3 Encounters:  03/26/15 272 lb 9.6 oz (123.651 kg)  01/28/15 269 lb 4 oz (122.131 kg)  12/25/14 268 lb (121.564 kg)   Constitutional: obese, in NAD Eyes: PERRLA, EOMI, no exophthalmos, no lid lag, no stare ENT: moist mucous membranes, no thyromegaly, no cervical lymphadenopathy Cardiovascular: RRR, No MRG Respiratory:  CTA B Gastrointestinal: abdomen soft, NT, ND, BS+ Musculoskeletal: no deformities, strength intact in all 4 Skin: moist, warm, no rashes Neurological: + mild tremor with outstretched hands, DTR normal in all 4  ASSESSMENT: 1. Adrenal incidentaloma - plasma fractionated metanephrines and catecholamines normal - Plasma Renin activity and aldosterone normal - dexamethasone suppression test normal  02/10/2012 CT abdomen:  31.5 HU nodule in the left adrenal gland measures 1.1 x 1.7 cm. Adrenal nodule measures 9.5 HU nodule in the right adrenal gland measures 1 cm.   05/28/2013 MRI abdomen:  Adrenals: Enlargement of the left adrenal gland to 23 x 14 mm is similar to comparison CT. There is loss of signal intensity on opposed phase imaging consistent with a benign adenoma. Mild nodular enlargement of the right adrenal gland to 11 mm is also unchanged. The right adrenal gland demonstrates loss signal intensity on opposed phase imaging consistent with a benign adenoma.  Lung bases are clear. No focal hepatic lesion. Multiple gallstones within the gallbladder. No biliary duct dilatation. Pancreas, S and spleen are normal. There peripelvic cysts within the left right kidney. No enhancing a cortical lesion.  The stomach and limited view of the small bowel colon are unremarkable. No retroperitoneal adenopathy. No aggressive osseous lesion.  IMPRESSION: 1. Bilateral small benign adrenal adenomas. 2. Cholelithiasis  11/13/2013 CT abdomen with contrast:  Lung bases are clear.  The liver, spleen, gallbladder, pancreas, abdominal aorta, inferior vena cava, and retroperitoneal lymph nodes are unremarkable. There are small bilateral adrenal gland nodules, similar to prior MRI examination and consistent with adenoma. Moderately prominent extrarenal collecting systems in both kidneys without ureterectasis. This is likely normal variation. The stomach, small bowel, and colon are not  abnormally distended. Fluid an air-fluid levels demonstrated throughout the colon consistent with liquid stool. This may indicate inflammatory process or gastroenteritis. No free air or free fluid in the abdomen.  Pelvis: The appendix is normal. Uterus and ovaries are not enlarged. No free or loculated pelvic fluid collections. Bladder wall is not thickened. No pelvic mass or lymphadenopathy. Degenerative changes in the lumbar spine. No destructive bone lesions.  IMPRESSION: Liquid stool  in the colon. Stable appearance of bilateral adrenal gland adenomas. Prominent extrarenal pelvis series in the kidneys, likely normal variation.   PLAN:  1. Patient with 2 (bilateral) adrenal nodules discovered incidentally.  - We again discussed about the fact that there are 3 possible scenarios: - A nonfunctioning adrenal nodule >> this is most likely her case - A functioning adrenal adenoma - which can hypersecrete catecholamines/metanephrines, cortisol, or aldosterone - these appear unlikely due to normal hormonal workup x2. However, we'll repeat the workup this year, and 2x more. - Adrenal cancer/mets - no known cancer, and there was no increase in size between 03/2012 and 11/2013 imaging.  I suggested to repeat the CT scan of her abdomen in ~2 years from the previous to ensure size stability (will order at next visit) - we reviewed her previous hormonal workup, which was negative for secreting adenoma: - dexamethasone suppression test (DST) to rule out Cushing syndrome (6% of adrenal incidentalomas) - Her cortisol suppressed to less than 5.0 >> Negative test - Plasma fractionated metanephrines and catecholamines to rule out pheochromocytoma (3% of adrenal incidentalomas) >> these were normal - Plasma renin activity and aldosterone level to rule out primary hyperaldosteronism (0.6% of adrenal incidentalomas) >> these were normal, too - we discussed about f/u:   hormonal testing yearly for 5 years -  repeat at this visit  CT scans yearly x1-2 >> I would repeat another CT scan in 1 more year and then stop - I will see her back in a year  Component     Latest Ref Rng 03/26/2015          Epinephrine      37  Norepinephrine      393  Dopamine      REPORT  Catecholamines, Total      430  PRA LC/MS/MS     0.25 - 5.82 ng/mL/h 3.53  ALDO / PRA Ratio     0.9 - 28.9 Ratio 1.4  ALDOSTERONE      5  Metanephrine, Free     <=57 pg/mL 33  Normetanephrine, Free     <=148 pg/mL 62  Total Metanephrines-Plasma     <=205 pg/mL 95  Potassium     3.5 - 5.1 mEq/L 4.2   Component     Latest Ref Rng 03/30/2015  Cortisol, Plasma     (DST) 2.6   All adrenal labs are normal

## 2015-03-26 NOTE — Patient Instructions (Signed)
Please stop at the lab. Please come at 8 am for a cortisol check after taking the Dexamethasone tab at 11 pm the night before. Please return in 1 year.

## 2015-03-30 ENCOUNTER — Other Ambulatory Visit (INDEPENDENT_AMBULATORY_CARE_PROVIDER_SITE_OTHER): Payer: 59

## 2015-03-30 DIAGNOSIS — E278 Other specified disorders of adrenal gland: Secondary | ICD-10-CM

## 2015-03-30 LAB — CATECHOLAMINES, FRACTIONATED, PLASMA
Catecholamines, Total: 430 pg/mL
Epinephrine: 37 pg/mL
Norepinephrine: 393 pg/mL

## 2015-03-30 LAB — CORTISOL: Cortisol, Plasma: 2.6 ug/dL

## 2015-03-30 LAB — METANEPHRINES, PLASMA
Metanephrine, Free: 33 pg/mL (ref <=57)
Normetanephrine, Free: 62 pg/mL (ref <=148)
Total Metanephrines-Plasma: 95 pg/mL (ref <=205)

## 2015-04-02 LAB — ALDOSTERONE + RENIN ACTIVITY W/ RATIO
ALDO / PRA RATIO: 1.4 ratio (ref 0.9–28.9)
Aldosterone: 5 ng/dL
PRA LC/MS/MS: 3.53 ng/mL/h (ref 0.25–5.82)

## 2015-04-09 ENCOUNTER — Encounter: Payer: Self-pay | Admitting: *Deleted

## 2015-04-09 NOTE — Progress Notes (Signed)
Patient ID: Cassie [name], female   DOB: 02/20/1961, 54 y.o.   MRN: [id number] Anxious 54 y.o.  f/u chronic palpitations and some PVC;s seen on event monitor. No CAD and normal EF by echo Has been seen by EP who suggested only beta blockers  And no antiarrhythmic Rx. Has lost 30-40 lbs since I last saw her and has had atenolol dose lowered She continues to have some anxiety. Issues with possible breast CA and adrenal abnormality both resolved.   Has myriad of complaints again Dyspnic. Fatigue Palpitations Feels HR is too high but when she checks it is in 90's  No frank syncope Was released from practice by Dr Laurian Brim   Event monitor 5/15  NSR no arrhythmias   ROS: Denies fever, malais, weight loss, blurry vision, decreased visual acuity, cough, sputum, SOB, hemoptysis, pleuritic pain, palpitaitons, heartburn, abdominal pain, melena, lower extremity edema, claudication, or rash.  All other systems reviewed and negative  General: Affect appropriate Healthy:  appears stated age 54: normal Neck supple with no adenopathy JVP normal no bruits no thyromegaly Lungs clear with no wheezing and good diaphragmatic motion Heart:  S1/S2 no murmur, no rub, gallop or click PMI normal Abdomen: benighn, BS positve, no tenderness, no AAA no bruit.  No HSM or HJR Distal pulses intact with no bruits No edema Neuro non-focal Skin warm and dry No muscular weakness   Current Outpatient Prescriptions  Medication Sig Dispense Refill  . ALPRAZolam (XANAX) 1 MG tablet Take 0.5-1 mg by mouth daily.    Marland Kitchen atenolol (TENORMIN) 50 MG tablet TAKE 1/2 TABLET BY MOUTH TWICE DAILY 30 tablet 6  . cholecalciferol (VITAMIN D) 1000 UNITS tablet Take 2,000 Units by mouth daily with lunch.     . dexamethasone (DECADRON) 1 MG tablet Take 1 tablet by mouth once at 11 pm, before coming for labs at 8 am the next morning 1 tablet 0  . dicyclomine (BENTYL) 10 MG capsule Take 1 capsule (10 mg total) by mouth 2 (two)  times daily. 20 capsule 2  . ibuprofen (ADVIL,MOTRIN) 600 MG tablet TAKE ONE TABLET BY MOUTH THREE TIMES DAILY WITH FOOD AS NEEDED 42 tablet 1  . JINTELI 1-5 MG-MCG TABS Take 1 tablet by mouth daily.     Marland Kitchen loperamide (IMODIUM A-D) 2 MG tablet Take 2 mg by mouth 4 (four) times daily as needed for diarrhea or loose stools.    . Probiotic Product (PROBIOTIC DAILY PO) Take 2 capsules by mouth daily with lunch.     . Simethicone (GAS-X PO) Take 1 tablet by mouth 4 (four) times daily as needed (For gas.). Do not exceed 6 tablets in a 24 hour period.    . vitamin B-12 (CYANOCOBALAMIN) 1000 MCG tablet Take 1 tablet (1,000 mcg total) by mouth daily. 30 tablet 6   No current facility-administered medications for this visit.    Allergies  Doxycycline; Sudafed; Acular; Diphenhydramine hcl; Mesalamine; Sulfasalazine; Caffeine; Penicillins; and Prednisone  Electrocardiogram:  9/15 sr  Normal ECG   Assessment and Plan Palpitations: Anxiety:   Jenkins Rouge

## 2015-04-14 ENCOUNTER — Encounter: Payer: 59 | Admitting: Cardiovascular Disease

## 2015-04-14 ENCOUNTER — Ambulatory Visit (INDEPENDENT_AMBULATORY_CARE_PROVIDER_SITE_OTHER): Payer: 59 | Admitting: Physician Assistant

## 2015-04-14 ENCOUNTER — Encounter: Payer: Self-pay | Admitting: Physician Assistant

## 2015-04-14 VITALS — BP 114/66 | HR 63 | Ht 64.5 in | Wt 275.0 lb

## 2015-04-14 DIAGNOSIS — I499 Cardiac arrhythmia, unspecified: Secondary | ICD-10-CM

## 2015-04-14 DIAGNOSIS — R002 Palpitations: Secondary | ICD-10-CM | POA: Diagnosis not present

## 2015-04-14 NOTE — Patient Instructions (Signed)
Medication Instructions:  Your physician recommends that you continue on your current medications as directed. Please refer to the Current Medication list given to you today.   Labwork: None ordered  Testing/Procedures: Your physician has requested that you have an echocardiogram. Echocardiography is a painless test that uses sound waves to create images of your heart. It provides your doctor with information about the size and shape of your heart and how well your heart's chambers and valves are working. This procedure takes approximately one hour. There are no restrictions for this procedure.    Follow-Up: Your physician wants you to follow-up in: 1 year with Dr.Nishan You will receive a reminder letter in the mail two months in advance. If you don't receive a letter, please call our office to schedule the follow-up appointment.   Any Other Special Instructions Will Be Listed Below (If Applicable).     If you need a refill on your cardiac medications before your next appointment, please call your pharmacy.

## 2015-04-14 NOTE — Progress Notes (Signed)
Cardiology Office Note   Date:  04/14/2015   ID:  Cassie [name], DOB 02-20-1961, MRN [id number]  PCP:  Hoyt Koch, MD  Cardiologist Dr. Johnsie Cancel  Chief Complaint: Fluttering    History of Present Illness: Cassie [name] is a 54 y.o. female who presents for yearly follow-up. She has a history of chronic palpitations some PVCs on event monitor treated with beta blocker, normal EF on echo in 2010. She has seen Dr. Caryl Comes in the past and he advised no antiarrhythmics or ablation. Nuclear stress test normal 2005.  Patient says she's had fluttering more frequently that last about 5 seconds. There are no different than the palpitation she's had in the past. She has some lightheadedness but not necessarily related to the fluttering. She denies any chest pain, dyspnea, or presyncope. She has been under a lot of stress with family illnesses since the summer. She has gained 25 pounds because of eating fast food and not exercising much. She had lost 75 pounds in the past.      Past Medical History  Diagnosis Date  . Anxiety   . Panic disorder   . Other allergy, other than to medicinal agents   . Ulcerative colitis, unspecified   . Morbid obesity ([place])   . Vitamin B12 deficiency   . Vitamin D deficiency   . Cardiac arrhythmia     palpitations,PVC'sAC  . Chronic kidney disease     cyst right kidney  . Arthritis     knees,thumbs  . Anemia     20 yrs. ago not now  . Adrenal nodule ([place])   . Gallstones     Past Surgical History  Procedure Laterality Date  . Breast biopsy  11/2011    Left- benign  . Colonoscopy    . Sigmoidoscopy    . Cholecystectomy N/A 01/17/2014    Procedure: LAPAROSCOPIC CHOLECYSTECTOMY;  Surgeon: Excell Seltzer, MD;  Location: [facility];  Service: General;  Laterality: N/A;  . Cataract extraction Right   . Retinal laser procedure Left      Current Outpatient Prescriptions  Medication Sig Dispense Refill  . ALPRAZolam (XANAX) 1 MG tablet Take 0.5 mg  by mouth daily.     Marland Kitchen atenolol (TENORMIN) 50 MG tablet TAKE 1/2 TABLET BY MOUTH TWICE DAILY 30 tablet 6  . cholecalciferol (VITAMIN D) 1000 UNITS tablet Take 2,000 Units by mouth daily with lunch.     . ibuprofen (ADVIL,MOTRIN) 600 MG tablet TAKE ONE TABLET BY MOUTH THREE TIMES DAILY WITH FOOD AS NEEDED 42 tablet 1  . JINTELI 1-5 MG-MCG TABS Take 1 tablet by mouth daily.     Marland Kitchen loperamide (IMODIUM A-D) 2 MG tablet Take 2 mg by mouth 4 (four) times daily as needed for diarrhea or loose stools.    . Probiotic Product (PROBIOTIC DAILY PO) Take 2 capsules by mouth daily with lunch.     . Simethicone (GAS-X PO) Take 1 tablet by mouth 4 (four) times daily as needed (For gas.). Do not exceed 6 tablets in a 24 hour period.    . vitamin B-12 (CYANOCOBALAMIN) 1000 MCG tablet Take 1 tablet (1,000 mcg total) by mouth daily. 30 tablet 6   No current facility-administered medications for this visit.    Allergies:   Doxycycline; Sudafed; Acular; Diphenhydramine hcl; Mesalamine; Sulfasalazine; Caffeine; Penicillins; and Prednisone    Social History:  The patient  reports that she quit smoking about 36 years ago. Her smoking use included Cigarettes. She has never used  smokeless tobacco. She reports that she does not drink alcohol or use illicit drugs.   Family History:  The patient's    family history includes Atrial fibrillation in her father; Heart disease (age of onset: 51) in her father. There is no history of Colon cancer or Stomach cancer.    ROS:  Please see the history of present illness.   Otherwise, review of systems are positive for none.   All other systems are reviewed and negative.    PHYSICAL EXAM: VS:  BP 114/66 mmHg  Pulse 63  Ht 5' 4.5" (1.638 m)  Wt 275 lb (124.739 kg)  BMI 46.49 kg/m2 , BMI Body mass index is 46.49 kg/(m^2). GEN: Obese in no acute distress Neck: no JVD, HJR, carotid bruits, or masses Cardiac: RRR; no murmurs,gallop, rubs, thrill or heave,  Respiratory:  clear to  auscultation bilaterally, normal work of breathing GI: soft, nontender, nondistended, + BS MS: no deformity or atrophy Extremities: without cyanosis, clubbing, edema, good distal pulses bilaterally.  Skin: warm and dry, no rash Neuro:  Strength and sensation are intact    EKG:  EKG is ordered today. The ekg ordered today demonstrates normal sinus rhythm, normal EKG Recent Labs: 11/17/2014: ALT 17 01/28/2015: BUN 10; Creatinine, Ser 0.84; Hemoglobin 13.6; Platelets 280.0; Sodium 141 03/26/2015: Potassium 4.2    Lipid Panel    Component Value Date/Time   CHOL 180 11/08/2012 0926   TRIG 130.0 11/08/2012 0926   HDL 31.10* 11/08/2012 0926   CHOLHDL 6 11/08/2012 0926   VLDL 26.0 11/08/2012 0926   LDLCALC 123* 11/08/2012 0926      Wt Readings from Last 3 Encounters:  04/14/15 275 lb (124.739 kg)  03/26/15 272 lb 9.6 oz (123.651 kg)  01/28/15 269 lb 4 oz (122.131 kg)      Other studies Reviewed: Additional studies/ records that were reviewed today include and review of the records demonstrates:   2-D echo 2010 Study Conclusions  Left ventricle: The cavity size was normal. There was mild focal  basal hypertrophy of the septum. Systolic function was normal. The  estimated ejection fraction was in the range of 55% to 60%. Wall  motion was normal; there were no regional wall motion abnormalities.     Echocardiography. M-mode, complete 2D, spectral Doppler, and  color Doppler. Height: Height: 165.1cm. Height: 65in. Weight:  Weight: 139.3kg. Weight: 306.4lb. Body mass index: BMI: 51.1kg/m^2.  Body surface area: BSA: 2.72m2. Patient status: Outpatient.  Location: [facility]   ASSESSMENT AND PLAN: Cardiac dysrhythmia Patient has had a slight increase in heart fluttering over the past month. It is no different than her prior symptoms. Labs were checked back in September were normal. Continue atenolol. If palpitations continue to worsen we can place a monitor on her. She is  under a lot of stress. Denies caffeine. Follow-up 2-D echo. Follow-up with Dr. NJohnsie Cancelin one year.  OBESITY, MORBID Weight loss and exercise program recommended     Signed, MErmalinda Barrios PA-C  04/14/2015 10:55 AM    [facility] [address] [place]  [id number]: [phone] Fax: [phone]

## 2015-04-14 NOTE — Assessment & Plan Note (Signed)
Weight loss and exercise program recommended

## 2015-04-14 NOTE — Assessment & Plan Note (Signed)
Patient has had a slight increase in heart fluttering over the past month. It is no different than her prior symptoms. Labs were checked back in September were normal. Continue atenolol. If palpitations continue to worsen we can place a monitor on her. She is under a lot of stress. Denies caffeine. Follow-up 2-D echo. Follow-up with Dr. Johnsie Cancel in one year.

## 2015-04-15 MED ORDER — ATENOLOL 50 MG PO TABS: 25.0000 mg | ORAL_TABLET | Freq: Two times a day (BID) | ORAL | Status: DC

## 2015-04-24 ENCOUNTER — Other Ambulatory Visit (HOSPITAL_COMMUNITY): Payer: 59

## 2015-04-25 ENCOUNTER — Other Ambulatory Visit: Payer: Self-pay | Admitting: Internal Medicine

## 2015-04-27 NOTE — Telephone Encounter (Signed)
Faxed to pharmacy

## 2015-05-07 ENCOUNTER — Other Ambulatory Visit: Payer: Self-pay

## 2015-05-07 ENCOUNTER — Ambulatory Visit (HOSPITAL_COMMUNITY): Payer: 59 | Attending: Cardiovascular Disease

## 2015-05-07 DIAGNOSIS — R002 Palpitations: Secondary | ICD-10-CM | POA: Diagnosis not present

## 2015-05-08 ENCOUNTER — Telehealth: Payer: Self-pay | Admitting: *Deleted

## 2015-05-08 NOTE — Telephone Encounter (Signed)
Called pt to inform her that her ECHO was normal.  Pt was very appreciative and verbalized understanding.

## 2015-05-08 NOTE — Telephone Encounter (Signed)
-----   Message from Imogene Burn, PA-C sent at 05/08/2015 11:07 AM EST ----- 2Decho normal

## 2015-05-15 ENCOUNTER — Telehealth: Payer: Self-pay | Admitting: Internal Medicine

## 2015-05-15 ENCOUNTER — Ambulatory Visit: Payer: 59 | Admitting: Internal Medicine

## 2015-05-15 NOTE — Telephone Encounter (Signed)
No but she will be charged for the next no-show

## 2015-05-15 NOTE — Telephone Encounter (Signed)
Dr Pyrtle Please advise  

## 2015-05-21 ENCOUNTER — Telehealth: Payer: Self-pay | Admitting: Internal Medicine

## 2015-05-21 NOTE — Telephone Encounter (Signed)
Pt states she is having abdominal pain and some loose stools/diarrhea. Pt requesting to be seen sooner than scheduled appt in March. Pts appt moved to Monday at 1:45pm, pt aware. Pt instructed to take Imodium and gas-x as needed, if pain to much pt instructed to go to the ER. Pt verbalized understanding.

## 2015-05-25 ENCOUNTER — Other Ambulatory Visit (INDEPENDENT_AMBULATORY_CARE_PROVIDER_SITE_OTHER): Payer: 59

## 2015-05-25 ENCOUNTER — Ambulatory Visit (INDEPENDENT_AMBULATORY_CARE_PROVIDER_SITE_OTHER): Payer: 59 | Admitting: Internal Medicine

## 2015-05-25 ENCOUNTER — Encounter: Payer: Self-pay | Admitting: Internal Medicine

## 2015-05-25 VITALS — BP 124/88 | HR 60 | Ht 64.5 in | Wt 278.4 lb

## 2015-05-25 DIAGNOSIS — R1013 Epigastric pain: Secondary | ICD-10-CM

## 2015-05-25 DIAGNOSIS — K51919 Ulcerative colitis, unspecified with unspecified complications: Secondary | ICD-10-CM | POA: Diagnosis not present

## 2015-05-25 DIAGNOSIS — Z8719 Personal history of other diseases of the digestive system: Secondary | ICD-10-CM | POA: Diagnosis not present

## 2015-05-25 LAB — COMPREHENSIVE METABOLIC PANEL
ALBUMIN: 3.7 g/dL (ref 3.5–5.2)
ALT: 16 U/L (ref 0–35)
AST: 12 U/L (ref 0–37)
Alkaline Phosphatase: 71 U/L (ref 39–117)
BILIRUBIN TOTAL: 0.3 mg/dL (ref 0.2–1.2)
BUN: 9 mg/dL (ref 6–23)
CALCIUM: 9.5 mg/dL (ref 8.4–10.5)
CHLORIDE: 107 meq/L (ref 96–112)
CO2: 28 mEq/L (ref 19–32)
CREATININE: 0.8 mg/dL (ref 0.40–1.20)
GFR: 78.81 mL/min (ref >60.00)
Glucose, Bld: 95 mg/dL (ref 70–99)
Potassium: 4.1 mEq/L (ref 3.5–5.1)
Sodium: 143 mEq/L (ref 135–145)
Total Protein: 7.5 g/dL (ref 6.0–8.3)

## 2015-05-25 LAB — CBC WITH DIFFERENTIAL/PLATELET
BASOS PCT: 0.8 % (ref 0.0–3.0)
Basophils Absolute: 0.1 10*3/uL (ref 0.0–0.1)
EOS ABS: 0.2 10*3/uL (ref 0.0–0.7)
EOS PCT: 2.5 % (ref 0.0–5.0)
HEMATOCRIT: 42.5 % (ref 36.0–46.0)
HEMOGLOBIN: 13.7 g/dL (ref 12.0–15.0)
LYMPHS PCT: 24.7 % (ref 12.0–46.0)
Lymphs Abs: 2.3 10*3/uL (ref 0.7–4.0)
MCHC: 32.3 g/dL (ref 30.0–36.0)
MCV: 95.3 fl (ref 78.0–100.0)
MONOS PCT: 5.1 % (ref 3.0–12.0)
Monocytes Absolute: 0.5 10*3/uL (ref 0.1–1.0)
Neutro Abs: 6.2 10*3/uL (ref 1.4–7.7)
Neutrophils Relative %: 66.9 % (ref 43.0–77.0)
Platelets: 308 10*3/uL (ref 150.0–400.0)
RBC: 4.46 Mil/uL (ref 3.87–5.11)
RDW: 13.6 % (ref 11.5–15.5)
WBC: 9.2 10*3/uL (ref 4.0–10.5)

## 2015-05-25 LAB — IGA: IgA: 341 mg/dL (ref 68–378)

## 2015-05-25 LAB — HIGH SENSITIVITY CRP: CRP HIGH SENSITIVITY: 17.52 mg/L — AB (ref 0.000–5.000)

## 2015-05-25 MED ORDER — NA SULFATE-K SULFATE-MG SULF 17.5-3.13-1.6 GM/177ML PO SOLN: ORAL | Status: DC

## 2015-05-25 NOTE — Patient Instructions (Addendum)
Your physician has requested that you go to the basement for the following lab work before leaving today: CBC, CMP, CRP, IgA, TtG  You have been scheduled for a colonoscopy. Please follow written instructions given to you at your visit today.  Please pick up your prep supplies at the pharmacy within the next 1-3 days. If you use inhalers (even only as needed), please bring them with you on the day of your procedure. Your physician has requested that you go to www.startemmi.com and enter the access code given to you at your visit today. This web site gives a general overview about your procedure. However, you should still follow specific instructions given to you by our office regarding your preparation for the procedure.  Please follow up with Dr Hilarie Fredrickson in 6 months.  You have been scheduled for an MRI/MRCP at [facility] on 06/15/15. Your appointment time is 9:00 am. Please arrive 15 minutes prior to your appointment time for registration purposes. Please make certain not to have anything to eat or drink 6 hours prior to your test. In addition, if you have any metal in your body, have a pacemaker or defibrillator, please be sure to let your ordering physician know. This test typically takes 45 minutes to 1 hour to complete.

## 2015-05-25 NOTE — Progress Notes (Signed)
Patient ID: Cassie [name], female   DOB: [birth year], 55 y.o.   MRN: [id number]  HPI: Cassie [name] is a 55 yo female with PMH of  Long-standing ulcerative colitis diagnosed around age 60 , B12 deficiency, vitamin D deficiency, arthritis who is seen in follow-up. She was  Managed by Dr. Olevia Perches prior to her retirement and this is her first visit  With me.   She is not currently on maintenance medication for ulcerative colitis and has been intolerant to previous treatment such as sulfasalazine and mesalamine. She reports sulfasalazine worsened her diarrhea and mesalamine also worsened diarrhea and caused hair loss. Her last colonoscopy was in November 2014 which showed left-sided colitis but also moderate colitis in the transverse colon and hepatic flexure. The rectum was spared. Biopsies were consistent with IBD and negative for dysplasia. She reports that for the most part she feels that her colitis is in remission. Bowel movements depend on her diet and can vary from one formed stool a day  To 2-3 loose stools daily. She denies blood in her stool or melena. No abdominal pain. No fevers or chills. She has had issues with bandlike upper abdominal pain coming in episodic fashion. She was seen by Alonza Bogus, PA-C in September after one such attack. This has occurred several times in the last few months. She wonders about retained gallstone. She had cholecystectomy 1-1/2 years ago for cholecystitis and was found to have gallstones pathology.   No family history of colorectal cancer or IBD  Past Medical History  Diagnosis Date  . Anxiety   . Panic disorder   . Other allergy, other than to medicinal agents   . Ulcerative colitis, unspecified   . Morbid obesity ([place])   . Vitamin B12 deficiency   . Vitamin D deficiency   . Cardiac arrhythmia     palpitations,PVC'sAC  . Chronic kidney disease     cyst right kidney  . Arthritis     knees,thumbs  . Anemia     20 yrs. ago not now  . Adrenal nodule ([place])    . Gallstones     Past Surgical History  Procedure Laterality Date  . Breast biopsy  11/2011    Left- benign  . Colonoscopy    . Sigmoidoscopy    . Cholecystectomy N/A 01/17/2014    Procedure: LAPAROSCOPIC CHOLECYSTECTOMY;  Surgeon: Excell Seltzer, MD;  Location: [facility];  Service: General;  Laterality: N/A;  . Cataract extraction Right   . Retinal laser procedure Left   . Cataract extraction Left     Outpatient Prescriptions Prior to Visit  Medication Sig Dispense Refill  . ALPRAZolam (XANAX) 1 MG tablet TAKE 1/2 TO 1 TABLET BY MOUTH TWICE DAILY AS DIRECTED 60 tablet 1  . atenolol (TENORMIN) 50 MG tablet Take 0.5 tablets (25 mg total) by mouth 2 (two) times daily. 30 tablet 11  . cholecalciferol (VITAMIN D) 1000 UNITS tablet Take 2,000 Units by mouth daily with lunch.     . ibuprofen (ADVIL,MOTRIN) 600 MG tablet TAKE ONE TABLET BY MOUTH THREE TIMES DAILY WITH FOOD AS NEEDED 42 tablet 1  . JINTELI 1-5 MG-MCG TABS Take 1 tablet by mouth daily.     Marland Kitchen loperamide (IMODIUM A-D) 2 MG tablet Take 2 mg by mouth 4 (four) times daily as needed for diarrhea or loose stools.    . Probiotic Product (PROBIOTIC DAILY PO) Take 2 capsules by mouth daily with lunch.     . Simethicone (GAS-X PO) Take 1  tablet by mouth 4 (four) times daily as needed (For gas.). Do not exceed 6 tablets in a 24 hour period.    . vitamin B-12 (CYANOCOBALAMIN) 1000 MCG tablet Take 1 tablet (1,000 mcg total) by mouth daily. 30 tablet 6   No facility-administered medications prior to visit.    Allergies  Allergen Reactions  . Doxycycline Palpitations  . Sudafed [Pseudoephedrine Hcl] Shortness Of Breath  . Acular [Ketorolac Tromethamine]     Unknown per pt   . Diphenhydramine Hcl Swelling    REACTION: unspecified  . Mesalamine     REACTION: unspecified. Does not remember taking. Hair loss-thinks this was in an enema in 2004  . Sulfasalazine Diarrhea  . Caffeine Palpitations  . Penicillins Rash    REACTION:  unspecified  . Prednisone Palpitations    REACTION: unspecified    Family History  Problem Relation Age of Onset  . Heart disease Father 28    pacemaker  . Atrial fibrillation Father   . Colon cancer Neg Hx   . Stomach cancer Neg Hx     Social History  Substance Use Topics  . Smoking status: Former Smoker    Types: Cigarettes    Quit date: 03/07/1979  . Smokeless tobacco: Never Used  . Alcohol Use: No    ROS: As per history of present illness, otherwise negative  BP 124/88 mmHg  Pulse 60  Ht 5' 4.5" (1.638 m)  Wt 278 lb 6 oz (126.27 kg)  BMI 47.06 kg/m2 Constitutional: Well-developed and well-nourished. No distress. HEENT: Normocephalic and atraumatic. Oropharynx is clear and moist. No oropharyngeal exudate. Conjunctivae are normal.  No scleral icterus. Neck: Neck supple. Trachea midline. Cardiovascular: Normal rate, regular rhythm and intact distal pulses. No M/R/G Pulmonary/chest: Effort normal and breath sounds normal. No wheezing, rales or rhonchi. Abdominal: Soft, obese, nontender, nondistended. Bowel sounds active throughout.  Extremities: no clubbing, cyanosis, or edema Lymphadenopathy: No cervical adenopathy noted. Neurological: Alert and oriented to person place and time. Skin: Skin is warm and dry. No rashes noted. Psychiatric: Normal mood and affect. Behavior is normal.  RELEVANT LABS AND IMAGING: CBC    Component Value Date/Time   WBC 9.0 01/28/2015 0955   RBC 4.33 01/28/2015 0955   HGB 13.6 01/28/2015 0955   HCT 41.3 01/28/2015 0955   PLT 280.0 01/28/2015 0955   MCV 95.4 01/28/2015 0955   MCH 31.1 11/17/2014 2240   MCHC 33.0 01/28/2015 0955   RDW 13.9 01/28/2015 0955   LYMPHSABS 1.3 01/28/2015 0955   MONOABS 0.5 01/28/2015 0955   EOSABS 0.2 01/28/2015 0955   BASOSABS 0.0 01/28/2015 0955    CMP     Component Value Date/Time   NA 141 01/28/2015 0955   K 4.2 03/26/2015 1027   CL 106 01/28/2015 0955   CO2 30 01/28/2015 0955   GLUCOSE 101*  01/28/2015 0955   BUN 10 01/28/2015 0955   CREATININE 0.84 01/28/2015 0955   CALCIUM 9.1 01/28/2015 0955   PROT 7.5 11/17/2014 2240   ALBUMIN 3.6 11/17/2014 2240   AST 19 11/17/2014 2240   ALT 17 11/17/2014 2240   ALKPHOS 61 11/17/2014 2240   BILITOT 0.6 11/17/2014 2240   GFRNONAA >60 11/17/2014 2240   GFRAA >60 11/17/2014 2240   Colonoscopy reviewed Abd [place] from Sept 2016 reviewed  ASSESSMENT/PLAN:  55 yo female with PMH of  Long-standing ulcerative colitis diagnosed around age 30 , B12 deficiency, vitamin D deficiency, arthritis who is seen in follow-up.  1.  Long-standing ulcerative colitis --  She is due surveillance colonoscopy at this time. We discussed guidelines to support surveillance colonoscopy for patients with long-term IBD every 12-24 months. We discussed the risks, benefits and alternatives to colonoscopy and she is agreeable to proceed. If active colitis is found I will recommend to her escalation of therapy likely to include biologic given her intolerance to 5-ASA medicines in the past. CBC, CMP, CRP and celiac panel today.   2. Upper abdominal pain and history of gallstones --  Episodic nature. Need to exclude retained CBD stone causing intermittent obstruction. MRCP recommended and scheduled   3. History of adrenal lesion --  Seen by endocrine, she feels she is due surveillance. I will ask Dr. Cruzita Lederer  If she would like abdominal MRI to be performed at the same time as MRCP for surveillance  12 month follow-up, sooner if needed and depending on colonoscopy results    JN:[id number] A [address], [place] [place], [place] [id number]

## 2015-05-26 ENCOUNTER — Telehealth: Payer: Self-pay | Admitting: *Deleted

## 2015-05-26 LAB — TISSUE TRANSGLUTAMINASE, IGA: TISSUE TRANSGLUTAMINASE AB, IGA: 1 U/mL (ref <4)

## 2015-05-26 NOTE — Telephone Encounter (Signed)
-----   Message from Jerene Bears, MD sent at 05/26/2015  1:08 PM EST ----- The patient's endocrinologist wants [place] to add MRI abd "focus on the adrenals" to the MRCP ordered yesterday Copy result to Dr. Cruzita Lederer Thanks JMP   ----- Message -----    From: Philemon Kingdom, MD    Sent: 05/26/2015   7:42 AM      To: Jerene Bears, MD  Dear [name], Yes, that would be a great idea! If you order the MRI, please also mention - "focus on the adrenals ". Thank you! Salena Saner  ----- Message -----    From: Jerene Bears, MD    Sent: 05/25/2015   4:53 PM      To: Philemon Kingdom, MD  Salley Scarlet Salena Saner I saw Cassie [name] today for her UC.  I have ordered MRCP to exclude retained CBD stone as she is having upper abd pain. She thinks she may be due surveillance imaging for adrenal lesion.  I wanted to check with you to see if MRI abd should be included with the MRCP I have ordered (which may save her money) Thanks [employer]

## 2015-05-26 NOTE — Telephone Encounter (Signed)
I have spoken to Cassie [name] in Radiology scheduling who states that she will place a note to focus on the adrenals on the MRI portion of the test.

## 2015-06-15 ENCOUNTER — Other Ambulatory Visit: Payer: Self-pay | Admitting: Internal Medicine

## 2015-06-15 ENCOUNTER — Telehealth: Payer: Self-pay | Admitting: Internal Medicine

## 2015-06-15 ENCOUNTER — Inpatient Hospital Stay (HOSPITAL_COMMUNITY)
Admission: RE | Admit: 2015-06-15 | Discharge: 2015-06-15 | Disposition: A | Discharge status: Home / Self Care | Payer: 59 | Source: Ambulatory Visit | Attending: Internal Medicine | Admitting: Internal Medicine

## 2015-06-15 ENCOUNTER — Ambulatory Visit (HOSPITAL_COMMUNITY)
Admission: RE | Admit: 2015-06-15 | Discharge: 2015-06-15 | Disposition: A | Discharge status: Home / Self Care | Payer: 59 | Source: Ambulatory Visit | Attending: Internal Medicine | Admitting: Internal Medicine

## 2015-06-15 DIAGNOSIS — Z8719 Personal history of other diseases of the digestive system: Secondary | ICD-10-CM

## 2015-06-15 DIAGNOSIS — R1013 Epigastric pain: Secondary | ICD-10-CM

## 2015-06-15 DIAGNOSIS — K51919 Ulcerative colitis, unspecified with unspecified complications: Secondary | ICD-10-CM

## 2015-06-15 DIAGNOSIS — K5100 Ulcerative (chronic) pancolitis without complications: Secondary | ICD-10-CM

## 2015-06-15 NOTE — Telephone Encounter (Signed)
Patient did not fit in scanner at [facility]. She needs open scanner due to weight. I have confirmed with Morey Hummingbird at Radiology that orders ARE correct. The tech added in a 3D Recon MRI for the attention to adrenals previously requested. Patient would like scan completed at [facility]. I have spoken to Mardene Celeste at [facility] who has scheduled patient for MR/MRCP, MRI attn adrenals. She states patient should be NPO midnight with the exception that she should drink plenty of water so her veins will be good enough to start IV. Patient is advised of her appointment 06/23/15 @ 8:30 am with 8 am arrival ([address] location). Amy Hazelwood, precert coordinator is advised of change in location as well.

## 2015-06-23 ENCOUNTER — Telehealth: Payer: Self-pay | Admitting: Internal Medicine

## 2015-06-23 ENCOUNTER — Ambulatory Visit
Admission: RE | Admit: 2015-06-23 | Discharge: 2015-06-23 | Disposition: A | Discharge status: Home / Self Care | Payer: 59 | Source: Ambulatory Visit | Attending: Internal Medicine | Admitting: Internal Medicine

## 2015-06-23 ENCOUNTER — Other Ambulatory Visit: Payer: 59

## 2015-06-23 DIAGNOSIS — R1013 Epigastric pain: Secondary | ICD-10-CM

## 2015-06-23 DIAGNOSIS — Z8719 Personal history of other diseases of the digestive system: Secondary | ICD-10-CM

## 2015-06-23 DIAGNOSIS — K5100 Ulcerative (chronic) pancolitis without complications: Secondary | ICD-10-CM

## 2015-06-23 MED ORDER — GADOBENATE DIMEGLUMINE 529 MG/ML IV SOLN
20.0000 mL | Freq: Once | INTRAVENOUS | Status: AC | PRN
  Administered 2015-06-23: 20 mL via INTRAVENOUS

## 2015-06-23 NOTE — Telephone Encounter (Signed)
Patient has questions about her MRI, please advise

## 2015-06-23 NOTE — Telephone Encounter (Signed)
Will call pt in the AM.

## 2015-06-24 NOTE — Telephone Encounter (Signed)
Called pt and answered her questions. Pt voiced understanding.

## 2015-06-24 NOTE — Telephone Encounter (Signed)
See result note.  

## 2015-07-01 ENCOUNTER — Ambulatory Visit (HOSPITAL_COMMUNITY): Payer: 59

## 2015-07-01 ENCOUNTER — Other Ambulatory Visit (HOSPITAL_COMMUNITY): Payer: 59

## 2015-07-06 ENCOUNTER — Telehealth: Payer: Self-pay | Admitting: Internal Medicine

## 2015-07-06 NOTE — Telephone Encounter (Signed)
No charge. 

## 2015-07-08 ENCOUNTER — Encounter: Payer: 59 | Admitting: Internal Medicine

## 2015-07-10 ENCOUNTER — Ambulatory Visit: Payer: 59 | Admitting: Internal Medicine

## 2015-07-16 ENCOUNTER — Other Ambulatory Visit: Payer: Self-pay | Admitting: Internal Medicine

## 2015-07-16 NOTE — Telephone Encounter (Signed)
Sent to [employer]

## 2015-08-17 ENCOUNTER — Telehealth: Payer: Self-pay | Admitting: Internal Medicine

## 2015-08-17 MED ORDER — DICYCLOMINE HCL 10 MG PO CAPS: 10.0000 mg | ORAL_CAPSULE | Freq: Three times a day (TID) | ORAL | Status: DC | PRN

## 2015-08-17 NOTE — Telephone Encounter (Signed)
Symptoms sound musculoskeletal. Advil or Tylenol for symptoms. If pain does not continue to improve, worsens or is associated with any cardiopulmonary symptoms or any other GI symptoms please call back.

## 2015-08-17 NOTE — Telephone Encounter (Signed)
Spoke with pt and she does not feel it is musculoskeletal. States the pain started after she ate a steak on Saturday and she also states she had diarrhea on Sunday. Would bentyl be an option for her? Please advise as doc of the day.

## 2015-08-17 NOTE — Telephone Encounter (Signed)
Pyrtle pt with history of UC. States she started having pain under her left rib Saturday and states it was a sharp stabbing pain that was intermittent. Sunday and today she states the pain is not a stabbing pain but a sore, pulling type discomfort. States she does not have any fever and no blood in stool. Pt states she has not had any blood in her stool. States she is supposed to go out of town later this week and is nervous about going due to the discomfort. Dr. Fuller Plan as doc of the day please advise.

## 2015-08-17 NOTE — Telephone Encounter (Signed)
Trial of Bentyl 10 mg po tid prn, #60, no refills. Avoid high fat foods, red meat, raw fruits, raw vegetabables and milk products until symptoms improve. If not improved call back.

## 2015-08-17 NOTE — Telephone Encounter (Signed)
Pt aware and script sent to pharmacy.

## 2015-09-09 ENCOUNTER — Other Ambulatory Visit: Payer: Self-pay | Admitting: Internal Medicine

## 2015-09-09 NOTE — Telephone Encounter (Signed)
Sent to pharmacy 

## 2015-09-10 IMAGING — CR DG WRIST COMPLETE 3+V*L*
2 series · 2 of 2 positions shown · non-contrast
Comparison: None in PACs

CLINICAL DATA: Left wrist pain for the past week without known
injury

EXAM:
LEFT WRIST - COMPLETE 3+ VIEW

[view not recorded (1 of 2)]
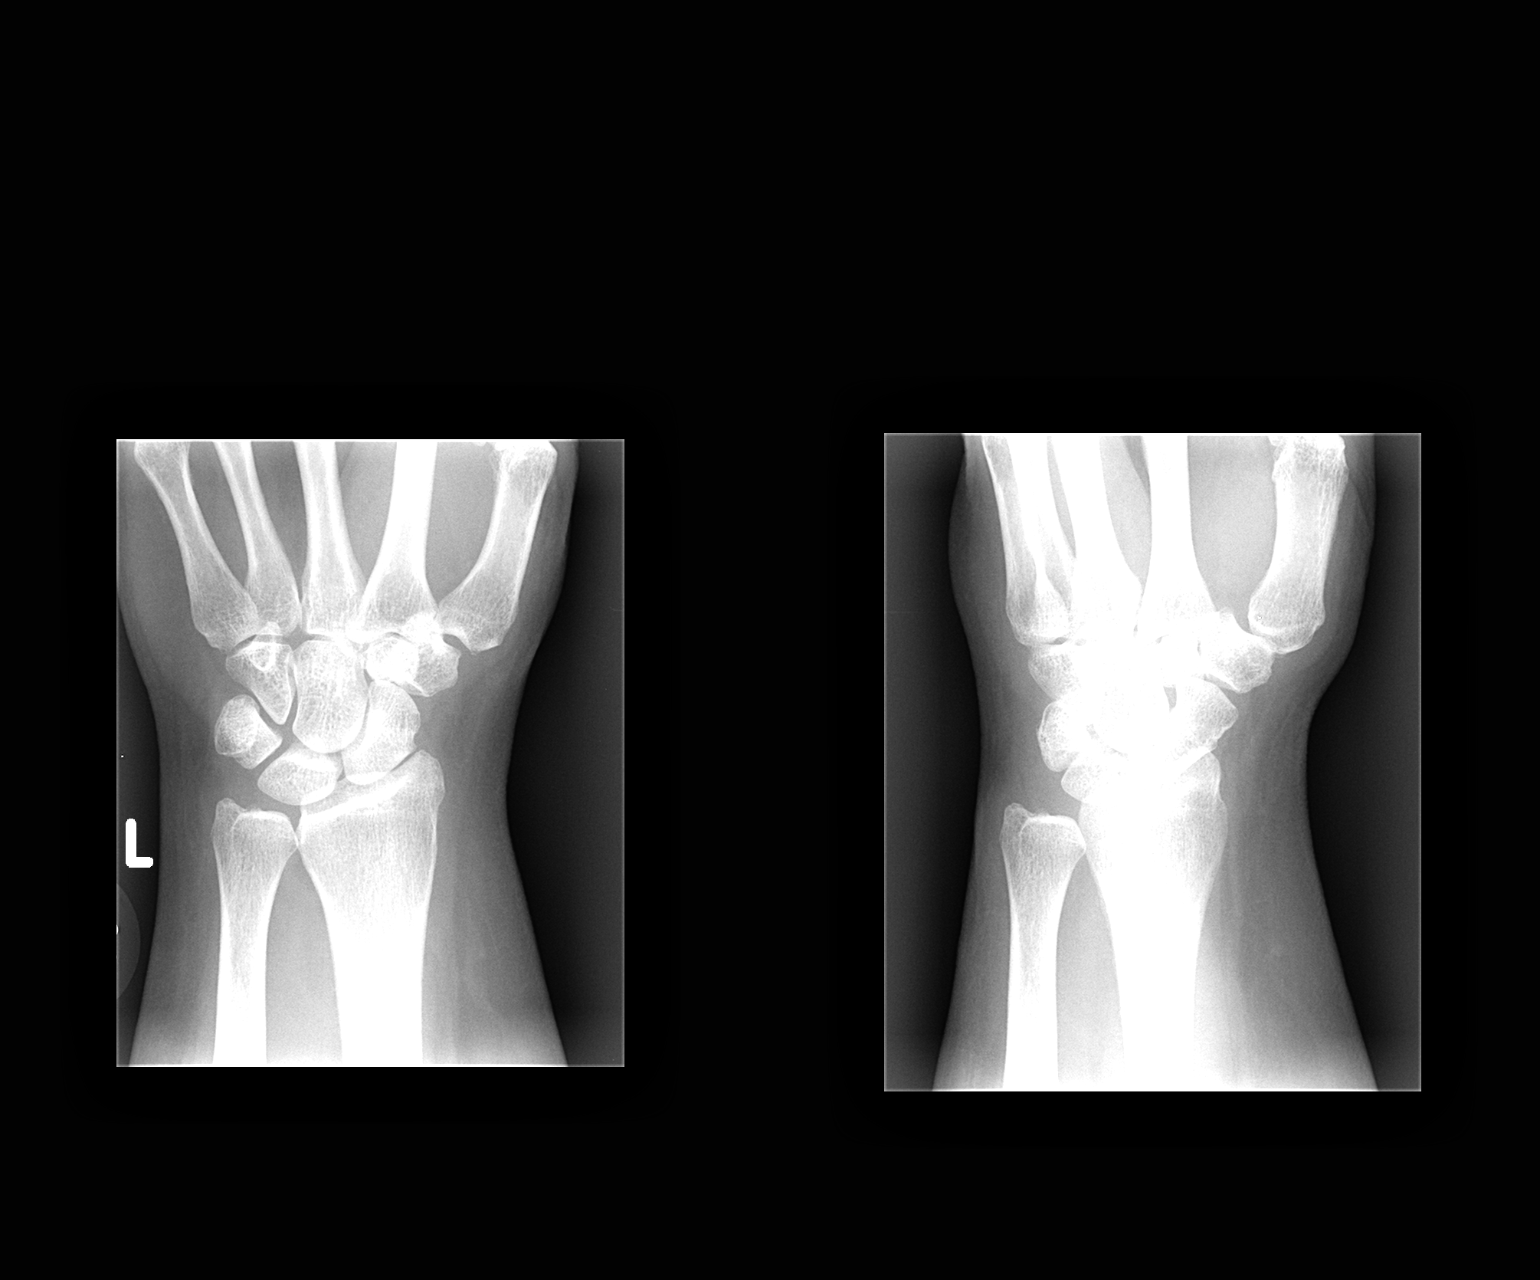

[view not recorded (2 of 2)]
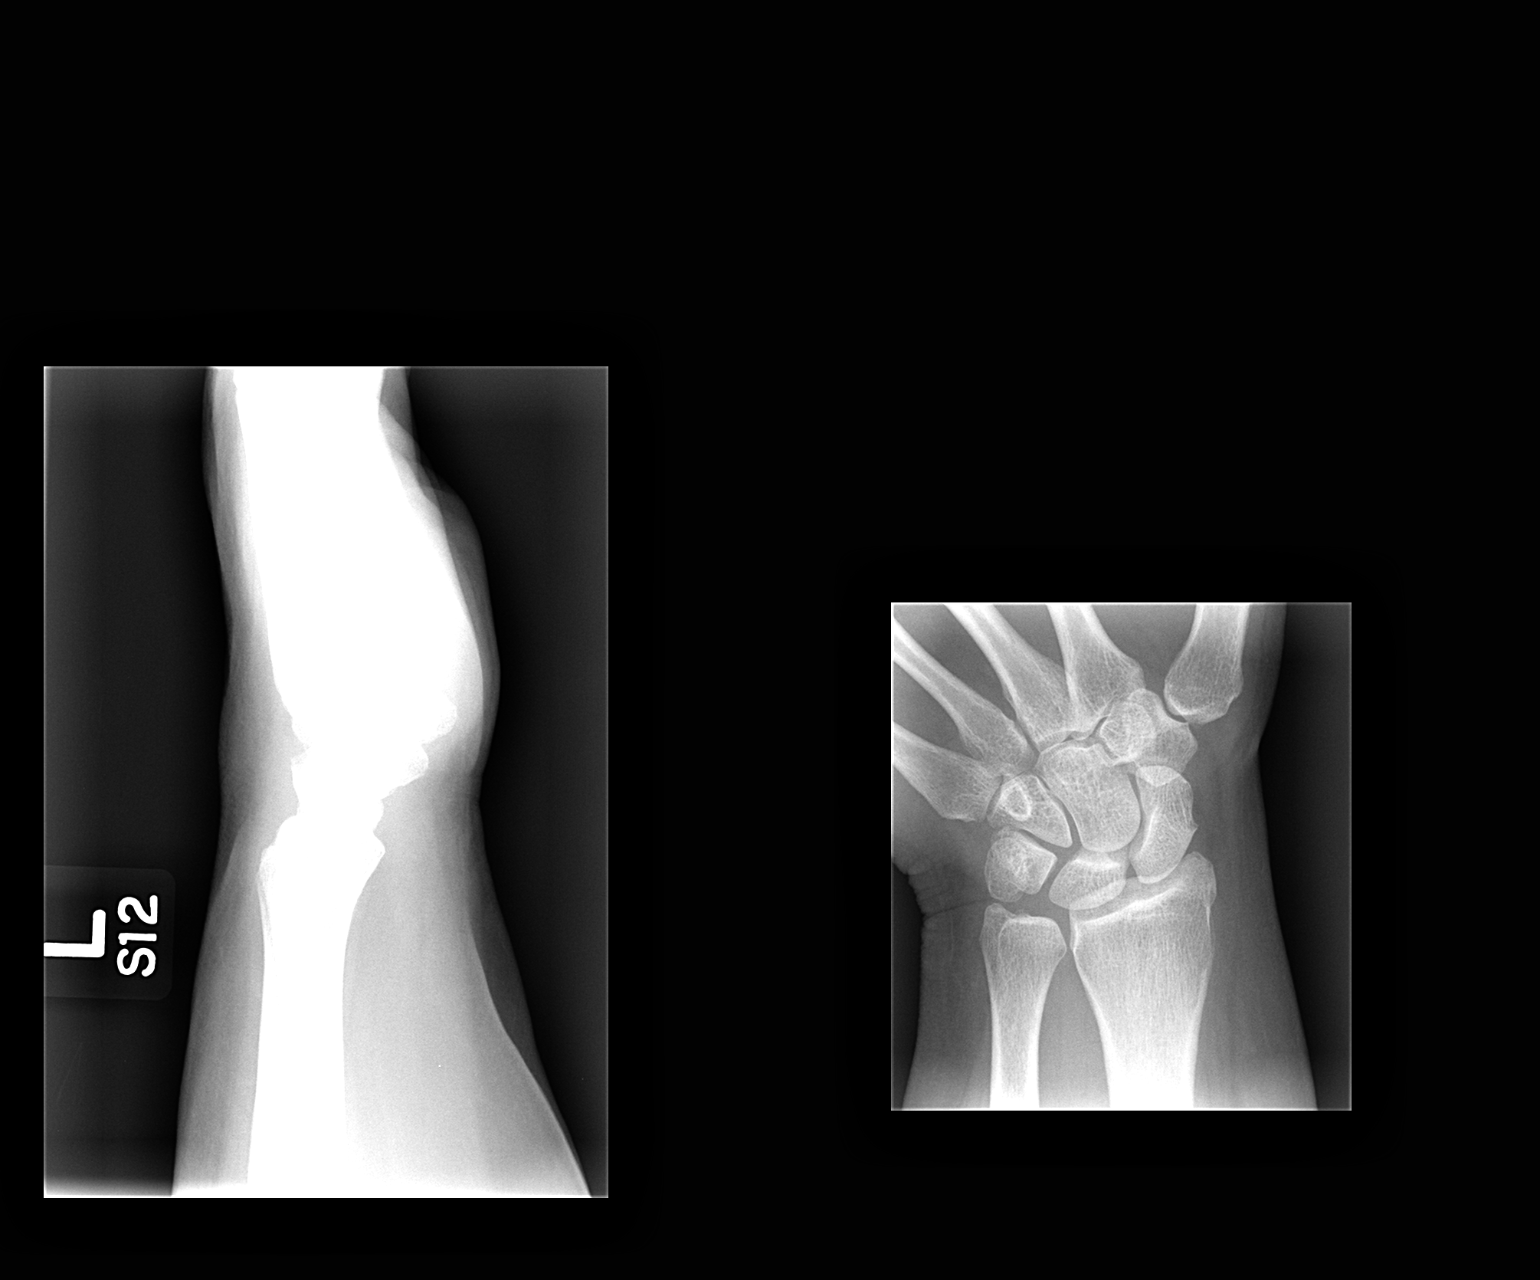

[2 of 2 positions shown; findings below may reference images not displayed]

FINDINGS: The bones are adequately mineralized. The radiocarpal, ulnocarpal,
and intercarpal joints are normal. The carpometacarpal joints are
unremarkable. There is no lytic nor blastic bony lesion. The soft
tissues are unremarkable.
IMPRESSION: There is no acute or chronic bony abnormality of the left wrist.

## 2015-09-15 ENCOUNTER — Telehealth: Payer: Self-pay | Admitting: Internal Medicine

## 2015-09-15 NOTE — Telephone Encounter (Signed)
patient states that she has had an ulceritive colitis flare up last week. She states that she has had minimal bleeding with mucous. She also states that she has gained 10 pounds within the past month.  She is wanting to know if she can still have upcoming procedure?

## 2015-09-15 NOTE — Telephone Encounter (Signed)
Spoke with pt and she is aware.

## 2015-09-15 NOTE — Telephone Encounter (Signed)
Would recommend proceeding with colonoscopy as scheduled

## 2015-09-15 NOTE — Telephone Encounter (Signed)
Pt thinks she was having a uc flare recently. States she was having urgency, a little mucous in the stool and a little bit of blood in the tissue. Pt states she took probiotics and imodium and ate a lot of potatoes and bread. She states that as of Saturday her stools are back to normal and the urgency is gone. She has not added fruits and veggies back to diet yet. Pt wants to know if her colon should be moved back or should she keep it as scheduled for 09/23/15. Please advise.

## 2015-09-17 ENCOUNTER — Encounter: Payer: Self-pay | Admitting: Internal Medicine

## 2015-09-17 NOTE — Telephone Encounter (Signed)
Ok to proceed with procedures as schedule She will be monitored in a controlled setting during the exam

## 2015-09-18 ENCOUNTER — Encounter: Payer: Self-pay | Admitting: Internal Medicine

## 2015-09-18 NOTE — Telephone Encounter (Signed)
Please reschedule the patient for colonoscopy as previously recommended when I saw her in clinic

## 2015-09-23 ENCOUNTER — Encounter: Payer: 59 | Admitting: Internal Medicine

## 2015-10-12 ENCOUNTER — Encounter (INDEPENDENT_AMBULATORY_CARE_PROVIDER_SITE_OTHER): Payer: 59 | Admitting: Ophthalmology

## 2015-10-12 DIAGNOSIS — H33302 Unspecified retinal break, left eye: Secondary | ICD-10-CM | POA: Diagnosis not present

## 2015-10-12 DIAGNOSIS — H43813 Vitreous degeneration, bilateral: Secondary | ICD-10-CM

## 2015-11-04 ENCOUNTER — Other Ambulatory Visit: Payer: Self-pay | Admitting: Internal Medicine

## 2015-11-05 NOTE — Telephone Encounter (Signed)
Script faxed [facility]ck to [facility].../lmb

## 2015-11-06 ENCOUNTER — Ambulatory Visit (AMBULATORY_SURGERY_CENTER): Payer: Self-pay | Admitting: *Deleted

## 2015-11-06 VITALS — Ht 65.5 in | Wt 290.0 lb

## 2015-11-06 DIAGNOSIS — K5100 Ulcerative (chronic) pancolitis without complications: Secondary | ICD-10-CM

## 2015-11-06 NOTE — Progress Notes (Signed)
No egg or soy allergy known to patient  No issues with past sedation with any surgeries  or procedures, no intubation problems  No diet pills per patient No home 02 use per patient  No blood thinners per patient  Pt denies issues with constipation  emmi declined Pt has a suprep at home from previous scheduled colon Pt had to cancel last time due to low heart rate- she is on atenolol- instructed her to keep check on her rate and call with issues and problems - heart rate in PV 55

## 2015-11-10 ENCOUNTER — Encounter: Payer: Self-pay | Admitting: Internal Medicine

## 2015-11-18 ENCOUNTER — Ambulatory Visit (AMBULATORY_SURGERY_CENTER): Payer: 59 | Admitting: Internal Medicine

## 2015-11-18 ENCOUNTER — Telehealth: Payer: Self-pay | Admitting: Internal Medicine

## 2015-11-18 ENCOUNTER — Encounter: Payer: Self-pay | Admitting: Internal Medicine

## 2015-11-18 VITALS — BP 123/82 | HR 60 | Temp 97.5°F | Resp 10 | Ht 65.5 in | Wt 290.0 lb

## 2015-11-18 DIAGNOSIS — K5100 Ulcerative (chronic) pancolitis without complications: Secondary | ICD-10-CM

## 2015-11-18 DIAGNOSIS — K635 Polyp of colon: Secondary | ICD-10-CM | POA: Diagnosis not present

## 2015-11-18 DIAGNOSIS — K529 Noninfective gastroenteritis and colitis, unspecified: Secondary | ICD-10-CM | POA: Diagnosis not present

## 2015-11-18 DIAGNOSIS — Z1211 Encounter for screening for malignant neoplasm of colon: Secondary | ICD-10-CM | POA: Diagnosis not present

## 2015-11-18 DIAGNOSIS — K5140 Inflammatory polyps of colon without complications: Secondary | ICD-10-CM | POA: Diagnosis not present

## 2015-11-18 DIAGNOSIS — D123 Benign neoplasm of transverse colon: Secondary | ICD-10-CM

## 2015-11-18 MED ORDER — SODIUM CHLORIDE 0.9 % IV SOLN: 500.0000 mL | INTRAVENOUS | Status: DC

## 2015-11-18 NOTE — Op Note (Addendum)
[facility] Patient Name: Cassie [name] Procedure Date: 11/18/2015 8:27 AM MRN: [id number] Endoscopist: Jerene Bears , MD Age: 55 Referring MD:  Date of Birth: Feb 25, 1961 Gender: Female Account #: [id number] Procedure:                Colonoscopy Indications:              High risk colon cancer surveillance: Ulcerative                            pancolitis of 8 (or more) years duration, last                            colon Nov 2014 Medicines:                Total IV Anesthesia (TIVA) Procedure:                Pre-Anesthesia Assessment:                           - Prior to the procedure, a History and Physical                            was performed, and patient medications and                            allergies were reviewed. The patient's tolerance of                            previous anesthesia was also reviewed. The risks                            and benefits of the procedure and the sedation                            options and risks were discussed with the patient.                            All questions were answered, and informed consent                            was obtained. Prior Anticoagulants: The patient has                            taken no previous anticoagulant or antiplatelet                            agents. ASA Grade Assessment: II - A patient with                            mild systemic disease. After reviewing the risks                            and benefits, the patient was deemed in  satisfactory condition to undergo the procedure.                           After obtaining informed consent, the colonoscope                            was passed under direct vision. Throughout the                            procedure, the patient's blood pressure, pulse, and                            oxygen saturations were monitored continuously. The                            Model CF-HQ190L [id number]) scope was introduced                             through the anus and advanced to the the cecum,                            identified by appendiceal orifice and ileocecal                            valve. The colonoscopy was performed without                            difficulty. The patient tolerated the procedure                            well. The quality of the bowel preparation was                            good. The ileocecal valve, appendiceal orifice, and                            rectum were photographed. Scope In: 8:45:57 AM Scope Out: 9:01:36 AM Scope Withdrawal Time: 0 hours 13 minutes 49 seconds  Total Procedure Duration: 0 hours 15 minutes 39 seconds  Findings:                 The digital rectal exam was normal.                           Inflammation characterized by congestion (edema),                            erythema, friability, granularity and loss of                            vascularity was found as large patches in the                            descending colon, in the splenic flexure, in the  transverse colon, in the hepatic flexure and in the                            ascending colon. The rectum, the recto-sigmoid                            colon and the cecum were spared. This was mild in                            severity, and when compared to previous                            examinations, the findings appear somewhat improved                            from 2014 colonoscopy with Dr. Olevia Perches.                           A 5 mm polyp was found in the proximal transverse                            colon. The polyp was sessile. The polyp was removed                            with a cold snare. Resection and retrieval were                            complete. Multiple biopsies were obtained with cold                            forceps for histology in a targeted manner.                           The retroflexed view of the distal rectum and anal                             verge was normal and showed no anal or rectal                            abnormalities. Complications:            No immediate complications. Estimated Blood Loss:     Estimated blood loss: none. Estimated blood loss                            was minimal. Impression:               - Ulcerative colitis. Inflammation was found in the                            descending colon, in the splenic flexure, in the                            transverse colon, in the  hepatic flexure and in the                            ascending colon. This was mild in severity.                            Surveillance biopsies performed.                           - One 5 mm polyp in the proximal transverse colon,                            removed with a cold snare. Resected and retrieved.                           - Multiple biopsies were obtained. Recommendation:           - Patient has a contact number available for                            emergencies. The signs and symptoms of potential                            delayed complications were discussed with the                            patient. Return to normal activities tomorrow.                            Written discharge instructions were provided to the                            patient.                           - Resume previous diet.                           - Continue present medications.                           - Await pathology results.                           - Repeat colonoscopy in 2 years for surveillance. Jerene Bears, MD 11/18/2015 9:14:48 AM This report has been signed electronically. Addendum Number: 1   Addendum Date: 11/18/2015 11:38:04 AM      At site of transverse colon polyp, the polyp was removed completely with       cold snare. Due to long-standing ulcerative colitis, additional biopsies       were obtained from around the polypectomy base with cold forceps to rule       out dysplasia. Jerene Bears,  MD 11/18/2015 11:39:04 AM This report has been signed electronically.

## 2015-11-18 NOTE — Telephone Encounter (Signed)
Patient was notified by Salome Arnt, RN that it is ok to take Imodium OTC as needed.

## 2015-11-18 NOTE — Progress Notes (Signed)
When I went into the bay to check the pt in, pt was coughing.  Pt stated that when anything touched her throat that it caused her to cough.  Pt had put her gown on and it was untied and loose, but she said it had touched her throat.  I asked what we could do to help her.  I offered to set the head of the stretcher up in a L position.  Pt did like that position better.  Her gown was pulled forward so it was not touching her skin.  I advised pt to let us know if she needed andthing while waiting for her colonoscopy.  Pt said she would. maw

## 2015-11-18 NOTE — Progress Notes (Signed)
Called to room to assist during endoscopic procedure.  Patient ID and intended procedure confirmed with present staff. Received instructions for my participation in the procedure from the performing physician.  

## 2015-11-18 NOTE — Patient Instructions (Signed)
Biopsies taken today and colon polyp x 1 removed. Handouts given. Resume current medications. Repeat colonoscopy in 2 years. Call us with any questions or concerns. Thank you!!  YOU HAD AN ENDOSCOPIC PROCEDURE TODAY AT [facility]:   Refer to the procedure report that was given to you for any specific questions about what was found during the examination.  If the procedure report does not answer your questions, please call your gastroenterologist to clarify.  If you requested that your care partner not be given the details of your procedure findings, then the procedure report has been included in a sealed envelope for you to review at your convenience later.  YOU SHOULD EXPECT: Some feelings of bloating in the abdomen. Passage of more gas than usual.  Walking can help get rid of the air that was put into your GI tract during the procedure and reduce the bloating. If you had a lower endoscopy (such as a colonoscopy or flexible sigmoidoscopy) you may notice spotting of blood in your stool or on the toilet paper. If you underwent a bowel prep for your procedure, you may not have a normal bowel movement for a few days.  Please Note:  You might notice some irritation and congestion in your nose or some drainage.  This is from the oxygen used during your procedure.  There is no need for concern and it should clear up in a day or so.  SYMPTOMS TO REPORT IMMEDIATELY:   Following lower endoscopy (colonoscopy or flexible sigmoidoscopy):  Excessive amounts of blood in the stool  Significant tenderness or worsening of abdominal pains  Swelling of the abdomen that is new, acute  Fever of 100F or higher   For urgent or emergent issues, a gastroenterologist can be reached at any hour by calling [phone].   DIET: Your first meal following the procedure should be a small meal and then it is ok to progress to your normal diet. Heavy or fried foods are harder to digest and may make you  feel nauseous or bloated.  Likewise, meals heavy in dairy and vegetables can increase bloating.  Drink plenty of fluids but you should avoid alcoholic beverages for 24 hours.  ACTIVITY:  You should plan to take it easy for the rest of today and you should NOT DRIVE or use heavy machinery until tomorrow (because of the sedation medicines used during the test).    FOLLOW UP: Our staff will call the number listed on your records the next business day following your procedure to check on you and address any questions or concerns that you may have regarding the information given to you following your procedure. If we do not reach you, we will leave a message.  However, if you are feeling well and you are not experiencing any problems, there is no need to return our call.  We will assume that you have returned to your regular daily activities without incident.  If any biopsies were taken you will be contacted by phone or by letter within the next 1-3 weeks.  Please call us at [phone] if you have not heard about the biopsies in 3 weeks.    SIGNATURES/CONFIDENTIALITY: You and/or your care partner have signed paperwork which will be entered into your electronic medical record.  These signatures attest to the fact that that the information above on your After Visit Summary has been reviewed and is understood.  Full responsibility of the confidentiality of this discharge information lies with  you and/or your care-partner.

## 2015-11-18 NOTE — Progress Notes (Signed)
To recovery, report to Doyle Askew, RN, VSS.

## 2015-11-19 ENCOUNTER — Telehealth: Payer: Self-pay

## 2015-11-19 NOTE — Telephone Encounter (Signed)
  Follow up Call-  Call back number 11/18/2015 03/29/2013  Post procedure Call Back phone  # [phone] hm [phone]  Permission to leave phone message Yes Yes     Patient questions:  Do you have a fever, pain , or abdominal swelling? No. Pain Score  0 *  Have you tolerated food without any problems? Yes.    Have you been able to return to your normal activities? Yes.    Do you have any questions about your discharge instructions: Diet   No. Medications  No. Follow up visit  No.  Do you have questions or concerns about your Care? No.  Actions: * If pain score is 4 or above: No action needed, pain <4.

## 2015-11-25 ENCOUNTER — Encounter: Payer: Self-pay | Admitting: Internal Medicine

## 2015-11-30 ENCOUNTER — Encounter: Payer: Self-pay | Admitting: Internal Medicine

## 2015-12-25 ENCOUNTER — Other Ambulatory Visit (INDEPENDENT_AMBULATORY_CARE_PROVIDER_SITE_OTHER): Payer: 59

## 2015-12-25 ENCOUNTER — Ambulatory Visit (INDEPENDENT_AMBULATORY_CARE_PROVIDER_SITE_OTHER): Payer: 59 | Admitting: Internal Medicine

## 2015-12-25 ENCOUNTER — Encounter: Payer: Self-pay | Admitting: Internal Medicine

## 2015-12-25 VITALS — BP 110/76 | HR 65 | Temp 98.1°F | Resp 16 | Ht 66.0 in | Wt 297.1 lb

## 2015-12-25 DIAGNOSIS — Z0000 Encounter for general adult medical examination without abnormal findings: Secondary | ICD-10-CM | POA: Diagnosis not present

## 2015-12-25 DIAGNOSIS — Z1159 Encounter for screening for other viral diseases: Secondary | ICD-10-CM

## 2015-12-25 DIAGNOSIS — E559 Vitamin D deficiency, unspecified: Secondary | ICD-10-CM

## 2015-12-25 DIAGNOSIS — I498 Other specified cardiac arrhythmias: Secondary | ICD-10-CM

## 2015-12-25 DIAGNOSIS — E538 Deficiency of other specified B group vitamins: Secondary | ICD-10-CM

## 2015-12-25 LAB — CBC
HEMATOCRIT: 41.9 % (ref 36.0–46.0)
HEMOGLOBIN: 13.9 g/dL (ref 12.0–15.0)
MCHC: 33.1 g/dL (ref 30.0–36.0)
MCV: 93.1 fl (ref 78.0–100.0)
PLATELETS: 300 10*3/uL (ref 150.0–400.0)
RBC: 4.5 Mil/uL (ref 3.87–5.11)
RDW: 14 % (ref 11.5–15.5)
WBC: 9.9 10*3/uL (ref 4.0–10.5)

## 2015-12-25 LAB — VITAMIN D 25 HYDROXY (VIT D DEFICIENCY, FRACTURES): VITD: 30.91 ng/mL (ref 30.00–100.00)

## 2015-12-25 LAB — COMPREHENSIVE METABOLIC PANEL
ALT: 14 U/L (ref 0–35)
AST: 13 U/L (ref 0–37)
Albumin: 3.8 g/dL (ref 3.5–5.2)
Alkaline Phosphatase: 69 U/L (ref 39–117)
BUN: 12 mg/dL (ref 6–23)
CHLORIDE: 108 meq/L (ref 96–112)
CO2: 28 meq/L (ref 19–32)
Calcium: 9.5 mg/dL (ref 8.4–10.5)
Creatinine, Ser: 0.91 mg/dL (ref 0.40–1.20)
GFR: 67.78 mL/min (ref >60.00)
GLUCOSE: 110 mg/dL — AB (ref 70–99)
POTASSIUM: 4.3 meq/L (ref 3.5–5.1)
SODIUM: 144 meq/L (ref 135–145)
Total Bilirubin: 0.4 mg/dL (ref 0.2–1.2)
Total Protein: 7.4 g/dL (ref 6.0–8.3)

## 2015-12-25 LAB — LIPID PANEL
CHOL/HDL RATIO: 4
CHOLESTEROL: 175 mg/dL (ref 0–200)
HDL: 40.3 mg/dL (ref >39.00)
LDL CALC: 114 mg/dL — AB (ref 0–99)
NONHDL: 134.65
Triglycerides: 103 mg/dL (ref 0.0–149.0)
VLDL: 20.6 mg/dL (ref 0.0–40.0)

## 2015-12-25 LAB — VITAMIN B12: VITAMIN B 12: 477 pg/mL (ref 211–911)

## 2015-12-25 LAB — HEPATITIS C ANTIBODY: HCV AB: NEGATIVE

## 2015-12-25 LAB — TSH: TSH: 3.19 u[IU]/mL (ref 0.35–4.50)

## 2015-12-25 MED ORDER — ALPRAZOLAM 1 MG PO TABS
ORAL_TABLET | ORAL | 3 refills | Status: DC
Start: 2015-12-25 — End: 2016-04-21

## 2015-12-25 MED ORDER — ATENOLOL 50 MG PO TABS: 25.0000 mg | ORAL_TABLET | Freq: Two times a day (BID) | ORAL | 11 refills | Status: DC

## 2015-12-25 NOTE — Progress Notes (Signed)
Pre visit review using our clinic review tool, if applicable. No additional management support is needed unless otherwise documented below in the visit note. 

## 2015-12-25 NOTE — Assessment & Plan Note (Signed)
Checking vitamin D level.  

## 2015-12-25 NOTE — Patient Instructions (Signed)
We are checking the labs today and call you with the results.   Work on increasing the exercise by doing some pool exercise for the knees. Every 1 pound you lose takes 4 pounds of pressure off your knees.   Health Maintenance, Female Adopting a healthy lifestyle and getting preventive care can go a long way to promote health and wellness. Talk with your health care provider about what schedule of regular examinations is right for you. This is a good chance for you to check in with your provider about disease prevention and staying healthy. In between checkups, there are plenty of things you can do on your own. Experts have done a lot of research about which lifestyle changes and preventive measures are most likely to keep you healthy. Ask your health care provider for more information. WEIGHT AND DIET  Eat a healthy diet  Be sure to include plenty of vegetables, fruits, low-fat dairy products, and lean protein.  Do not eat a lot of foods high in solid fats, added sugars, or salt.  Get regular exercise. This is one of the most important things you can do for your health.  Most adults should exercise for at least 150 minutes each week. The exercise should increase your heart rate and make you sweat (moderate-intensity exercise).  Most adults should also do strengthening exercises at least twice a week. This is in addition to the moderate-intensity exercise.  Maintain a healthy weight  Body mass index (BMI) is a measurement that can be used to identify possible weight problems. It estimates body fat based on height and weight. Your health care provider can help determine your BMI and help you achieve or maintain a healthy weight.  For females 67 years of age and older:   A BMI below 18.5 is considered underweight.  A BMI of 18.5 to 24.9 is normal.  A BMI of 25 to 29.9 is considered overweight.  A BMI of 30 and above is considered obese.  Watch levels of cholesterol and blood  lipids  You should start having your blood tested for lipids and cholesterol at 57 years of age, then have this test every 5 years.  You may need to have your cholesterol levels checked more often if:  Your lipid or cholesterol levels are high.  You are older than 57 years of age.  You are at high risk for heart disease.  CANCER SCREENING   Lung Cancer  Lung cancer screening is recommended for adults 79-78 years old who are at high risk for lung cancer because of a history of smoking.  A yearly low-dose CT scan of the lungs is recommended for people who:  Currently smoke.  Have quit within the past 15 years.  Have at least a 30-pack-year history of smoking. A pack year is smoking an average of one pack of cigarettes a day for 1 year.  Yearly screening should continue until it has been 15 years since you quit.  Yearly screening should stop if you develop a health problem that would prevent you from having lung cancer treatment.  Breast Cancer  Practice breast self-awareness. This means understanding how your breasts normally appear and feel.  It also means doing regular breast self-exams. Let your health care provider know about any changes, no matter how small.  If you are in your 20s or 30s, you should have a clinical breast exam (CBE) by a health care provider every 1-3 years as part of a regular health exam.  If you are 40 or older, have a CBE every year. Also consider having a breast X-ray (mammogram) every year.  If you have a family history of breast cancer, talk to your health care provider about genetic screening.  If you are at high risk for breast cancer, talk to your health care provider about having an MRI and a mammogram every year.  Breast cancer gene (BRCA) assessment is recommended for women who have family members with BRCA-related cancers. BRCA-related cancers include:  Breast.  Ovarian.  Tubal.  Peritoneal cancers.  Results of the assessment  will determine the need for genetic counseling and BRCA1 and BRCA2 testing. Cervical Cancer Your health care provider may recommend that you be screened regularly for cancer of the pelvic organs (ovaries, uterus, and vagina). This screening involves a pelvic examination, including checking for microscopic changes to the surface of your cervix (Pap test). You may be encouraged to have this screening done every 3 years, beginning at age 24.  For women ages 59-65, health care providers may recommend pelvic exams and Pap testing every 3 years, or they may recommend the Pap and pelvic exam, combined with testing for human papilloma virus (HPV), every 5 years. Some types of HPV increase your risk of cervical cancer. Testing for HPV may also be done on women of any age with unclear Pap test results.  Other health care providers may not recommend any screening for nonpregnant women who are considered low risk for pelvic cancer and who do not have symptoms. Ask your health care provider if a screening pelvic exam is right for you.  If you have had past treatment for cervical cancer or a condition that could lead to cancer, you need Pap tests and screening for cancer for at least 20 years after your treatment. If Pap tests have been discontinued, your risk factors (such as having a new sexual partner) need to be reassessed to determine if screening should resume. Some women have medical problems that increase the chance of getting cervical cancer. In these cases, your health care provider may recommend more frequent screening and Pap tests. Colorectal Cancer  This type of cancer can be detected and often prevented.  Routine colorectal cancer screening usually begins at 57 years of age and continues through 58 years of age.  Your health care provider may recommend screening at an earlier age if you have risk factors for colon cancer.  Your health care provider may also recommend using home test kits to check  for hidden blood in the stool.  A small camera at the end of a tube can be used to examine your colon directly (sigmoidoscopy or colonoscopy). This is done to check for the earliest forms of colorectal cancer.  Routine screening usually begins at age 26.  Direct examination of the colon should be repeated every 5-10 years through 57 years of age. However, you may need to be screened more often if early forms of precancerous polyps or small growths are found. Skin Cancer  Check your skin from head to toe regularly.  Tell your health care provider about any new moles or changes in moles, especially if there is a change in a mole's shape or color.  Also tell your health care provider if you have a mole that is larger than the size of a pencil eraser.  Always use sunscreen. Apply sunscreen liberally and repeatedly throughout the day.  Protect yourself by wearing long sleeves, pants, a wide-brimmed hat, and sunglasses whenever you  are outside. HEART DISEASE, DIABETES, AND HIGH BLOOD PRESSURE   High blood pressure causes heart disease and increases the risk of stroke. High blood pressure is more likely to develop in:  People who have blood pressure in the high end of the normal range (130-139/85-89 mm Hg).  People who are overweight or obese.  People who are African American.  If you are 18-39 years of age, have your blood pressure checked every 3-5 years. If you are 40 years of age or older, have your blood pressure checked every year. You should have your blood pressure measured twice--once when you are at a hospital or clinic, and once when you are not at a hospital or clinic. Record the average of the two measurements. To check your blood pressure when you are not at a hospital or clinic, you can use:  An automated blood pressure machine at a pharmacy.  A home blood pressure monitor.  If you are between 55 years and 79 years old, ask your health care provider if you should take  aspirin to prevent strokes.  Have regular diabetes screenings. This involves taking a blood sample to check your fasting blood sugar level.  If you are at a normal weight and have a low risk for diabetes, have this test once every three years after 57 years of age.  If you are overweight and have a high risk for diabetes, consider being tested at a younger age or more often. PREVENTING INFECTION  Hepatitis B  If you have a higher risk for hepatitis B, you should be screened for this virus. You are considered at high risk for hepatitis B if:  You were born in a country where hepatitis B is common. Ask your health care provider which countries are considered high risk.  Your parents were born in a high-risk country, and you have not been immunized against hepatitis B (hepatitis B vaccine).  You have HIV or AIDS.  You use needles to inject street drugs.  You live with someone who has hepatitis B.  You have had sex with someone who has hepatitis B.  You get hemodialysis treatment.  You take certain medicines for conditions, including cancer, organ transplantation, and autoimmune conditions. Hepatitis C  Blood testing is recommended for:  Everyone born from 1945 through 1965.  Anyone with known risk factors for hepatitis C. Sexually transmitted infections (STIs)  You should be screened for sexually transmitted infections (STIs) including gonorrhea and chlamydia if:  You are sexually active and are younger than 57 years of age.  You are older than 57 years of age and your health care provider tells you that you are at risk for this type of infection.  Your sexual activity has changed since you were last screened and you are at an increased risk for chlamydia or gonorrhea. Ask your health care provider if you are at risk.  If you do not have HIV, but are at risk, it may be recommended that you take a prescription medicine daily to prevent HIV infection. This is called  pre-exposure prophylaxis (PrEP). You are considered at risk if:  You are sexually active and do not regularly use condoms or know the HIV status of your partner(s).  You take drugs by injection.  You are sexually active with a partner who has HIV. Talk with your health care provider about whether you are at high risk of being infected with HIV. If you choose to begin PrEP, you should first be tested for   HIV. You should then be tested every 3 months for as long as you are taking PrEP.  PREGNANCY   If you are premenopausal and you may become pregnant, ask your health care provider about preconception counseling.  If you may become pregnant, take 400 to 800 micrograms (mcg) of folic acid every day.  If you want to prevent pregnancy, talk to your health care provider about birth control (contraception). OSTEOPOROSIS AND MENOPAUSE   Osteoporosis is a disease in which the bones lose minerals and strength with aging. This can result in serious bone fractures. Your risk for osteoporosis can be identified using a bone density scan.  If you are 65 years of age or older, or if you are at risk for osteoporosis and fractures, ask your health care provider if you should be screened.  Ask your health care provider whether you should take a calcium or vitamin D supplement to lower your risk for osteoporosis.  Menopause may have certain physical symptoms and risks.  Hormone replacement therapy may reduce some of these symptoms and risks. Talk to your health care provider about whether hormone replacement therapy is right for you.  HOME CARE INSTRUCTIONS   Schedule regular health, dental, and eye exams.  Stay current with your immunizations.   Do not use any tobacco products including cigarettes, chewing tobacco, or electronic cigarettes.  If you are pregnant, do not drink alcohol.  If you are breastfeeding, limit how much and how often you drink alcohol.  Limit alcohol intake to no more than 1  drink per day for nonpregnant women. One drink equals 12 ounces of beer, 5 ounces of wine, or 1 ounces of hard liquor.  Do not use street drugs.  Do not share needles.  Ask your health care provider for help if you need support or information about quitting drugs.  Tell your health care provider if you often feel depressed.  Tell your health care provider if you have ever been abused or do not feel safe at home.   This information is not intended to replace advice given to you by your health care provider. Make sure you discuss any questions you have with your health care provider.   Document Released: 11/08/2010 Document Revised: 05/16/2014 Document Reviewed: 03/27/2013 Elsevier Interactive Patient Education 2016 [employer]  

## 2015-12-25 NOTE — Assessment & Plan Note (Signed)
Checking B12 level today and adjust as needed.

## 2015-12-25 NOTE — Progress Notes (Signed)
   Subjective:    Patient ID: Cassie [name], female    DOB: 08/25/1960, 55 y.o.   MRN: [id number]  HPI The patient is a 55 YO female coming in for wellness.   PMH, [facility], social history reviewed and updated.   Review of Systems  Constitutional: Negative for activity change, appetite change, chills, fatigue, fever and unexpected weight change.  HENT: Negative.   Eyes: Negative.   Respiratory: Negative for cough, chest tightness, shortness of breath and wheezing.   Cardiovascular: Negative for chest pain, palpitations and leg swelling.  Gastrointestinal: Negative for abdominal distention, abdominal pain, constipation and diarrhea.  Musculoskeletal: Positive for arthralgias. Negative for back pain, gait problem and myalgias.  Skin: Negative for pallor, rash and wound.  Neurological: Negative.   Psychiatric/Behavioral: Negative for behavioral problems, decreased concentration and dysphoric mood.      Objective:   Physical Exam  Constitutional: She is oriented to person, place, and time. She appears well-developed and well-nourished.  HENT:  Head: Normocephalic and atraumatic.  Eyes: EOM are normal.  Neck: Normal range of motion.  Cardiovascular: Normal rate and regular rhythm.   No murmur heard. Pulmonary/Chest: Effort normal and breath sounds normal. No respiratory distress. She has no wheezes. She has no rales.  Abdominal: Soft. Bowel sounds are normal. She exhibits no distension. There is no tenderness. There is no rebound.  Musculoskeletal: She exhibits no tenderness.  Neurological: She is alert and oriented to person, place, and time.  Skin: Skin is warm and dry.  Psychiatric: She has a normal mood and affect.   Vitals:   12/25/15 0805  BP: 110/76  Pulse: 65  Resp: 16  Temp: 98.1 F (36.7 C)  TempSrc: Oral  SpO2: 98%  Weight: 297 lb 1.9 oz (134.8 kg)  Height: 5' 6"  (1.676 m)      Assessment & Plan:

## 2015-12-25 NOTE — Assessment & Plan Note (Signed)
Weight is up now and we discussed pool aerobics to help with her weight loss since she has so many arthritis problems with walking and traditional exercise.

## 2015-12-25 NOTE — Assessment & Plan Note (Signed)
Checking labs, colonoscopy up to date as well as pap smear and mammogram. Checking hep c screening and declines need for hiv screening. Counseled on sun safety and mole surveillance and on the dangers of distracted driving. Given screening recommendations.

## 2016-01-29 ENCOUNTER — Other Ambulatory Visit: Payer: Self-pay | Admitting: Geriatric Medicine

## 2016-01-29 ENCOUNTER — Telehealth: Payer: Self-pay

## 2016-01-29 DIAGNOSIS — I498 Other specified cardiac arrhythmias: Secondary | ICD-10-CM

## 2016-01-29 MED ORDER — ATENOLOL 50 MG PO TABS: 25.0000 mg | ORAL_TABLET | Freq: Two times a day (BID) | ORAL | 3 refills | Status: DC

## 2016-01-29 NOTE — Telephone Encounter (Signed)
Sent 90 day supply to [facility] on [address].

## 2016-01-29 NOTE — Telephone Encounter (Signed)
atenolol (TENORMIN) 50 MG tablet  1 half morning and 1 half in the night.. Is how she takes it.   Patient states that she has a pharmacy near her that has the rx she needs. She would like a 27monthor a 63monthupply of this medication.  The pharmacy she needs it sent is   [facility] on [phone] spoke with DaShanon Browho told her they did have it.

## 2016-03-18 ENCOUNTER — Encounter: Payer: Self-pay | Admitting: Internal Medicine

## 2016-03-18 ENCOUNTER — Ambulatory Visit (INDEPENDENT_AMBULATORY_CARE_PROVIDER_SITE_OTHER): Payer: 59 | Admitting: Internal Medicine

## 2016-03-18 VITALS — BP 142/82 | HR 81 | Temp 98.3°F | Resp 20 | Ht 65.0 in | Wt 305.8 lb

## 2016-03-18 DIAGNOSIS — R300 Dysuria: Secondary | ICD-10-CM

## 2016-03-18 DIAGNOSIS — N390 Urinary tract infection, site not specified: Secondary | ICD-10-CM | POA: Insufficient documentation

## 2016-03-18 DIAGNOSIS — N3000 Acute cystitis without hematuria: Secondary | ICD-10-CM | POA: Diagnosis not present

## 2016-03-18 LAB — POCT URINALYSIS DIPSTICK
BILIRUBIN UA: NEGATIVE
Blood, UA: 1
Glucose, UA: NEGATIVE
Ketones, UA: NEGATIVE
NITRITE UA: NEGATIVE
PH UA: 6
PROTEIN UA: NEGATIVE
Spec Grav, UA: >=1.03
Urobilinogen, UA: NEGATIVE

## 2016-03-18 MED ORDER — NITROFURANTOIN MONOHYD MACRO 100 MG PO CAPS: 100.0000 mg | ORAL_CAPSULE | Freq: Two times a day (BID) | ORAL | 0 refills | Status: DC

## 2016-03-18 NOTE — Progress Notes (Signed)
Pre visit review using our clinic review tool, if applicable. No additional management support is needed unless otherwise documented below in the visit note. 

## 2016-03-18 NOTE — Assessment & Plan Note (Signed)
U/A consistent with UTI. Rx macrobid.

## 2016-03-18 NOTE — Progress Notes (Signed)
   Subjective:    Patient ID: Cassie [name], female    DOB: [birth year], 55 y.o.   MRN: [id number]  HPI The patient is a 55 YO female coming in for possible UTI and nausea. Started about 1 week ago. Denies fevers or chills. A lot of suprapubic pressure and pain. She is urinating normally, some darker. Having some GI complaints and this is different than that.   Review of Systems  Constitutional: Negative.   Respiratory: Negative.   Cardiovascular: Negative.   Gastrointestinal: Positive for diarrhea.  Genitourinary: Positive for dysuria and frequency. Negative for decreased urine volume, difficulty urinating, flank pain, hematuria, pelvic pain, urgency, vaginal bleeding and vaginal discharge.  Musculoskeletal: Negative.       Objective:   Physical Exam  Constitutional: She appears well-developed and well-nourished.  HENT:  Head: Normocephalic and atraumatic.  Eyes: EOM are normal.  Cardiovascular: Normal rate and regular rhythm.   Pulmonary/Chest: Effort normal and breath sounds normal.  Abdominal: Soft.  Suprapubic tenderness, no flank pain  Skin: Skin is warm and dry.   Vitals:   03/18/16 1118  BP: (!) 142/82  Pulse: 81  Resp: 20  Temp: 98.3 F (36.8 C)  TempSrc: Oral  SpO2: 98%  Weight: (!) 305 lb 12.8 oz (138.7 kg)  Height: 5' 5"  (1.651 m)      Assessment & Plan:

## 2016-03-18 NOTE — Patient Instructions (Addendum)
We have sent in the medicine called macrobid or nitrofurantoin. Take 1 pill twice a day for 1 week.  If you are not feeling better by Monday call us back.

## 2016-03-20 ENCOUNTER — Encounter: Payer: Self-pay | Admitting: Internal Medicine

## 2016-03-21 ENCOUNTER — Telehealth: Payer: Self-pay | Admitting: Internal Medicine

## 2016-03-21 ENCOUNTER — Encounter: Payer: Self-pay | Admitting: Internal Medicine

## 2016-03-21 DIAGNOSIS — R1030 Lower abdominal pain, unspecified: Secondary | ICD-10-CM

## 2016-03-21 NOTE — Telephone Encounter (Signed)
Answered via my chart

## 2016-03-21 NOTE — Telephone Encounter (Signed)
Patient called in saying she is not feeling better. She was her on Friday to see Dr. Sharlet Salina. She does have a uti and is taking abx. She is still having some pain in her belly and when she goes to the bathroom. She wanted to know if you needed to test anything else or what her next step should be, She is worried it could be something else going on. Please follow up with patient. Thank you.

## 2016-03-22 ENCOUNTER — Encounter: Payer: Self-pay | Admitting: Internal Medicine

## 2016-03-23 ENCOUNTER — Encounter: Payer: Self-pay | Admitting: Internal Medicine

## 2016-03-24 ENCOUNTER — Other Ambulatory Visit: Payer: Self-pay | Admitting: Internal Medicine

## 2016-03-24 ENCOUNTER — Ambulatory Visit
Admission: RE | Admit: 2016-03-24 | Discharge: 2016-03-24 | Disposition: A | Discharge status: Home / Self Care | Payer: 59 | Source: Ambulatory Visit | Attending: Internal Medicine | Admitting: Internal Medicine

## 2016-03-24 DIAGNOSIS — R1030 Lower abdominal pain, unspecified: Secondary | ICD-10-CM

## 2016-03-24 DIAGNOSIS — E278 Other specified disorders of adrenal gland: Secondary | ICD-10-CM

## 2016-03-24 MED ORDER — ONDANSETRON HCL 4 MG PO TABS: 4.0000 mg | ORAL_TABLET | Freq: Three times a day (TID) | ORAL | 0 refills | Status: DC | PRN

## 2016-03-24 MED ORDER — IOPAMIDOL (ISOVUE-300) INJECTION 61%
100.0000 mL | Freq: Once | INTRAVENOUS | Status: AC | PRN
  Administered 2016-03-24: 100 mL via INTRAVENOUS

## 2016-03-25 ENCOUNTER — Ambulatory Visit: Payer: 59 | Admitting: Internal Medicine

## 2016-03-25 ENCOUNTER — Encounter: Payer: Self-pay | Admitting: Internal Medicine

## 2016-03-30 ENCOUNTER — Encounter: Payer: Self-pay | Admitting: Internal Medicine

## 2016-03-30 ENCOUNTER — Other Ambulatory Visit (INDEPENDENT_AMBULATORY_CARE_PROVIDER_SITE_OTHER): Payer: 59

## 2016-03-30 ENCOUNTER — Ambulatory Visit (INDEPENDENT_AMBULATORY_CARE_PROVIDER_SITE_OTHER): Payer: 59 | Admitting: Internal Medicine

## 2016-03-30 VITALS — BP 110/68 | HR 66 | Temp 98.5°F | Resp 18 | Ht 66.0 in | Wt 305.0 lb

## 2016-03-30 DIAGNOSIS — R109 Unspecified abdominal pain: Secondary | ICD-10-CM | POA: Diagnosis not present

## 2016-03-30 LAB — POCT URINALYSIS DIPSTICK
BILIRUBIN UA: NEGATIVE
GLUCOSE UA: NEGATIVE
KETONES UA: NEGATIVE
Nitrite, UA: NEGATIVE
PH UA: 5.5
Protein, UA: 15
RBC UA: 1
Spec Grav, UA: >=1.03
Urobilinogen, UA: 0.2

## 2016-03-30 LAB — CBC
HEMATOCRIT: 39.9 % (ref 36.0–46.0)
Hemoglobin: 13.2 g/dL (ref 12.0–15.0)
MCHC: 33 g/dL (ref 30.0–36.0)
MCV: 93.8 fl (ref 78.0–100.0)
PLATELETS: 305 10*3/uL (ref 150.0–400.0)
RBC: 4.26 Mil/uL (ref 3.87–5.11)
RDW: 14.3 % (ref 11.5–15.5)
WBC: 8.9 10*3/uL (ref 4.0–10.5)

## 2016-03-30 LAB — COMPREHENSIVE METABOLIC PANEL
ALBUMIN: 3.5 g/dL (ref 3.5–5.2)
ALT: 14 U/L (ref 0–35)
AST: 12 U/L (ref 0–37)
Alkaline Phosphatase: 68 U/L (ref 39–117)
BUN: 8 mg/dL (ref 6–23)
CALCIUM: 9.1 mg/dL (ref 8.4–10.5)
CHLORIDE: 106 meq/L (ref 96–112)
CO2: 28 meq/L (ref 19–32)
CREATININE: 0.84 mg/dL (ref 0.40–1.20)
GFR: 74.27 mL/min (ref >60.00)
Glucose, Bld: 99 mg/dL (ref 70–99)
POTASSIUM: 4.2 meq/L (ref 3.5–5.1)
SODIUM: 141 meq/L (ref 135–145)
Total Bilirubin: 0.4 mg/dL (ref 0.2–1.2)
Total Protein: 7.1 g/dL (ref 6.0–8.3)

## 2016-03-30 LAB — C-REACTIVE PROTEIN: CRP: 1.4 mg/dL (ref 0.5–20.0)

## 2016-03-30 LAB — LIPASE: LIPASE: 35 U/L (ref 11.0–59.0)

## 2016-03-30 LAB — SEDIMENTATION RATE: SED RATE: 42 mm/h — AB (ref 0–30)

## 2016-03-30 NOTE — Progress Notes (Signed)
Pre visit review using our clinic review tool, if applicable. No additional management support is needed unless otherwise documented below in the visit note. 

## 2016-03-30 NOTE — Assessment & Plan Note (Signed)
CT with contrast without acute findings which was discussed with her today and images pulled up for explanations. Checking CMP, CBC, ESR, CRP to check for flare of her UC. She is insistent that prednisone is bothersome to her and she cannot take without serious side effects. If positive for flare she will call her GI specialist for advice. Checking U/A in office with minimal leukocytes and will check culture before antibiotics as she has many side effects with antibiotics. She is encouraged to use gas-x and work on some exercise to help with that as well.

## 2016-03-30 NOTE — Progress Notes (Signed)
   Subjective:    Patient ID: Cassie [name], female    DOB: Aug 18, 1960, 55 y.o.   MRN: [id number]  HPI The patient is a 55 YO female coming in for follow up of UTI and ongoing abdominal pain. She has UC which was active as of last colonoscopy this year. She denies signs of a flare though because she normally has blood or mucus in her stools which she does not have. They have been normal and no constipation or diarrhea. No fevers or chills. Some pain still after urinating even after finishing the macrobid. No side effects from macrobid. The pain in her stomach is migrating and mostly LLQ and sometimes in the LUQ and RUQ and RLQ but today LLQ soreness. Not relieved with defecation. Had CT scan and has questions about the findings. These images are pulled up with her today and explanations given.   Review of Systems  Constitutional: Positive for activity change and appetite change. Negative for chills, fatigue, fever and unexpected weight change.  HENT: Negative.   Eyes: Negative.   Respiratory: Negative for cough, chest tightness, shortness of breath and wheezing.   Cardiovascular: Negative for chest pain, palpitations and leg swelling.  Gastrointestinal: Positive for abdominal pain. Negative for abdominal distention, blood in stool, constipation, diarrhea, nausea and vomiting.  Musculoskeletal: Negative.   Skin: Negative.   Neurological: Negative for weakness, light-headedness and numbness.  Psychiatric/Behavioral: Positive for agitation. Negative for decreased concentration, dysphoric mood, sleep disturbance and suicidal ideas. The patient is nervous/anxious.       Objective:   Physical Exam  Constitutional: She is oriented to person, place, and time. She appears well-developed and well-nourished.  Overweight  HENT:  Head: Normocephalic and atraumatic.  Eyes: EOM are normal.  Neck: Normal range of motion.  Cardiovascular: Normal rate and regular rhythm.   Pulmonary/Chest: Effort  normal. No respiratory distress. She has no wheezes. She has no rales.  Abdominal: Soft. She exhibits no distension. There is tenderness. There is no rebound and no guarding.  Some tenderness in the lower LQ of the stomach.   Musculoskeletal: She exhibits no edema.  Neurological: She is alert and oriented to person, place, and time. Coordination normal.  Skin: Skin is warm and dry.   Vitals:   03/30/16 1054  BP: 110/68  Pulse: 66  Resp: 18  Temp: 98.5 F (36.9 C)  TempSrc: Oral  SpO2: 98%  Weight: (!) 305 lb (138.3 kg)  Height: 5' 6"  (1.676 m)      Assessment & Plan:

## 2016-03-30 NOTE — Patient Instructions (Addendum)
We will check the labs today and the culture for the urine. There are minimal signs that could be infection but I would recommend to wait until the culture is back since you have so many problems with medicines.   We will send the results on mychart.

## 2016-03-31 ENCOUNTER — Encounter: Payer: Self-pay | Admitting: Internal Medicine

## 2016-03-31 LAB — URINE CULTURE

## 2016-04-05 ENCOUNTER — Encounter: Payer: Self-pay | Admitting: Internal Medicine

## 2016-04-07 ENCOUNTER — Encounter: Payer: Self-pay | Admitting: Internal Medicine

## 2016-04-07 LAB — HM MAMMOGRAPHY

## 2016-04-12 ENCOUNTER — Encounter: Payer: Self-pay | Admitting: Internal Medicine

## 2016-04-12 DIAGNOSIS — R609 Edema, unspecified: Secondary | ICD-10-CM

## 2016-04-12 MED ORDER — PHENAZOPYRIDINE HCL 100 MG PO TABS: 100.0000 mg | ORAL_TABLET | Freq: Three times a day (TID) | ORAL | 0 refills | Status: DC | PRN

## 2016-04-14 ENCOUNTER — Other Ambulatory Visit (INDEPENDENT_AMBULATORY_CARE_PROVIDER_SITE_OTHER): Payer: 59

## 2016-04-14 ENCOUNTER — Encounter: Payer: Self-pay | Admitting: Internal Medicine

## 2016-04-14 DIAGNOSIS — R609 Edema, unspecified: Secondary | ICD-10-CM

## 2016-04-14 LAB — BRAIN NATRIURETIC PEPTIDE: PRO B NATRI PEPTIDE: 51 pg/mL (ref 0.0–100.0)

## 2016-04-21 ENCOUNTER — Other Ambulatory Visit: Payer: Self-pay | Admitting: Internal Medicine

## 2016-04-21 NOTE — Telephone Encounter (Signed)
Routing to dr crawford, please advise, thanks 

## 2016-04-21 NOTE — Telephone Encounter (Signed)
MD approved faxed script back to [facility].../lmb

## 2016-05-06 ENCOUNTER — Ambulatory Visit (INDEPENDENT_AMBULATORY_CARE_PROVIDER_SITE_OTHER): Payer: 59 | Admitting: Internal Medicine

## 2016-05-06 ENCOUNTER — Encounter: Payer: Self-pay | Admitting: Internal Medicine

## 2016-05-06 VITALS — BP 124/64 | HR 68 | Temp 98.2°F | Resp 16 | Ht 65.0 in | Wt 312.8 lb

## 2016-05-06 DIAGNOSIS — E278 Other specified disorders of adrenal gland: Secondary | ICD-10-CM

## 2016-05-06 LAB — POTASSIUM: POTASSIUM: 4.1 meq/L (ref 3.5–5.1)

## 2016-05-06 MED ORDER — DEXAMETHASONE 1 MG PO TABS: ORAL_TABLET | ORAL | 0 refills | Status: DC

## 2016-05-06 NOTE — Patient Instructions (Signed)
Please stop at the lab.  Please come at 8 am for a cortisol check after taking the Dexamethasone tab at 11 pm the night before.  Please return in 1 year.

## 2016-05-06 NOTE — Progress Notes (Signed)
Patient ID: Cassie [name], female   DOB: 13-Feb-1961, 55 y.o.   MRN: [id number]   HPI  Cassie [name] is a 55 y.o.-year-old female, initially referred by Dr. Delfin Edis, returning for follow-up for bilateral adrenal incidentaloma. She is here with her husband who offers part of the hx. last visit a year ago.  Reviewed and addended history: Patient was incidentally found to have 2 adrenal nodules on a CT scan obtained before her colonoscopy from 2013.   Abdominal CT scan (02/10/2012): 31.5 HU nodule in the left adrenal gland measures 1.1 x 1.7 cm. Left adrenal nodule measures 9.5 HU nodule in the right adrenal gland measures 1 cm.   Abdominal MRI with focus on the adrenal (05/2013) showed stable appearance of the adrenal nodules.  Enlargement of the left adrenal gland to 23 x 14 mm is similar to comparison CT. There is loss of signal intensity on opposed phase imaging consistent with a benign adenoma.  Mild nodular enlargement of the right adrenal gland to 11 mm is also unchanged. The right adrenal gland demonstrates loss signal intensity on opposed phase imaging consistent with a benign adenoma.  Abdominal CT scan with contrast for investigation of diarrhea and abdominal cramping (11/2013): confirmed stable adrenal nodules consistent with adenomas. She had GB surgery soon after this.  There are small bilateral adrenal gland nodules, similar to prior MRI examination and consistent with adenoma.   She had a new abdominal CT (03/24/2016) showing: minimal nonspecific nodularity at the adrenal glands. No significant mass is seen.  The entire adrenal hormonal workup was normal in 2014, 2015, 2016 - reviewed with the patient today: Component     Latest Ref Rng & Units 03/25/2013 03/25/2014 03/26/2015  Epinephrine     pg/mL 45 38 37  Norepinephrine     pg/mL 365 300 393  Dopamine      < LLN < LLN < LLN  Catecholamines, Total     pg/mL 410 338 430  PRA LC/MS/MS     0.25 - 5.82 ng/mL/h 3.69  4.07 3.53  ALDO / PRA Ratio     0.9 - 28.9 Ratio 2.4 1.0 1.4  ALDOSTERONE     ng/dL 9 4 5   Metanephrine, Pl     <=57 pg/mL <25 27 33  Normetanephrine, Pl     <=148 pg/mL 48 59 62  Total Metanephrines-Plasma     <=205 pg/mL 48 86 95  Of note, potassium levels were normal at all checks.  Dexamethasone stimulation tests normal: Component     Latest Ref Rng & Units 04/09/2013 04/01/2014 03/30/2015  Cortisol, Plasma     ug/dL 2.6 2.2 2.6   She does not have HTN, but is on Atenolol 25 mg bid for occasional palpitations. She still has these, more in past 2 weeks.  Has UC. She has been on Prednisone for the UC years ago and also got inj with steroids in the past, not recently.  ROS: Constitutional: see HPI Eyes: no blurry vision, no xerophthalmia ENT: no sore throat, no nodules palpated in throat, no dysphagia/odynophagia, no hoarseness Cardiovascular: no CP/SOB/+ palpitations/+ leg swelling Respiratory: + cough/+SOB/+ wheezing Gastrointestinal: no N/V/+D/no C Musculoskeletal: no muscle/+ joint aches Skin: no rashes, + hair loss - androgenic alopecia. Neurological: + tremors/no numbness/tingling/dizziness, no HA  I reviewed pt's medications, allergies, PMH, social hx, family hx, and changes were documented in the history of present illness. Otherwise, unchanged from my initial visit note.  PE: BP 124/64   Pulse 68  Temp 98.2 F (36.8 C) (Oral)   Resp 16   Ht 5' 5"  (1.651 m)   Wt (!) 312 lb 12.8 oz (141.9 kg)   BMI 52.05 kg/m  Body mass index is 52.05 kg/m. Wt Readings from Last 3 Encounters:  05/06/16 (!) 312 lb 12.8 oz (141.9 kg)  03/30/16 (!) 305 lb (138.3 kg)  03/18/16 (!) 305 lb 12.8 oz (138.7 kg)   Constitutional: obese, in NAD Eyes: PERRLA, EOMI, no exophthalmos, no lid lag, no stare ENT: moist mucous membranes, no thyromegaly, no cervical lymphadenopathy Cardiovascular: RRR, No MRG Respiratory: CTA B Gastrointestinal: abdomen soft, NT, ND, BS+ Musculoskeletal:  no deformities, strength intact in all 4 Skin: moist, warm, no rashes Neurological: + mild tremor with outstretched hands, DTR normal in all 4  ASSESSMENT: 1. Adrenal incidentaloma  PLAN:  1. Patient with 2 (bilateral) adrenal nodules discovered incidentally in 2013. Since then, she was followed yearly with annual plasma metanephrines and catecholamines, plasma reading activity and aldosterone levels, and dexamethasone suppression tests and all of these have been normal 3x. Therefore, her adrenal nodules appeared to be nonsecreting. However, we discussed about the recommended follow-up for these nodules:  hormonal testing yearly for 5 years - repeat at this visit and that the next and then stop  CT scans yearly x1-2 >> already done  - We reviewed possible adrenal pathology: - A nonfunctioning adrenal nodule >> most likely her case - A functioning adrenal adenoma - which can hypersecrete catecholamines/metanephrines, cortisol, or aldosterone - these appear unlikely due to normal hormonal workup x3.  - Adrenal cancer/mets - no known cancer, and there was no increase in size between 03/2012 and 11/2015 imaging. No further imaging tests are indicated. - I will see her back in a year, for a last visit  Component     Latest Ref Rng & Units 05/06/2016          Epinephrine     pg/mL 51  Norepinephrine     pg/mL 501  Dopamine      REPORT  Catecholamines, Total     pg/mL 552  PRA LC/MS/MS     0.25 - 5.82 ng/mL/h 1.32  ALDO / PRA Ratio     0.9 - 28.9 Ratio SEE NOTE  ALDOSTERONE     ng/dL <1  Metanephrine, Pl     <=57 pg/mL 37  Normetanephrine, Pl     <=148 pg/mL 73  Total Metanephrines-Plasma     <=205 pg/mL 110  Potassium     3.5 - 5.1 mEq/L 4.1   Message sent: Dear Ms Beatrix Shipper, Adrenal labs are again normal!  I will let you know about the cortisol as soon as it is resulted. Sincerely, Philemon Kingdom MD  Philemon Kingdom, MD PhD [facility] Endocrinology

## 2016-05-10 LAB — ALDOSTERONE + RENIN ACTIVITY W/ RATIO
Aldosterone: <1 ng/dL
PRA LC/MS/MS: 1.32 ng/mL/h (ref 0.25–5.82)

## 2016-05-11 ENCOUNTER — Encounter: Payer: Self-pay | Admitting: Internal Medicine

## 2016-05-11 LAB — METANEPHRINES, PLASMA
METANEPHRINE FREE: 37 pg/mL (ref <=57)
NORMETANEPHRINE FREE: 73 pg/mL (ref <=148)
TOTAL METANEPHRINES-PLASMA: 110 pg/mL (ref <=205)

## 2016-05-12 ENCOUNTER — Encounter: Payer: Self-pay | Admitting: Internal Medicine

## 2016-05-12 LAB — CATECHOLAMINES, FRACTIONATED, PLASMA
Catecholamines, Total: 552 pg/mL
EPINEPHRINE: 51 pg/mL
NOREPINEPHRINE: 501 pg/mL

## 2016-05-16 NOTE — Progress Notes (Signed)
Cardiology Office Note   Date:  05/19/2016   ID:  JODECI RINI, DOB 05-Jan-1961, MRN [id number]  PCP:  Hoyt Koch, MD  Cardiologist Dr. Johnsie Cancel  Chief Complaint: Fluttering    History of Present Illness: Cassie [name] is a 56 y.o. female who presents for yearly follow-up. She has a history of chronic palpitations some PVCs on event monitor treated with beta blocker, normal EF on echo in 2010. She has seen Dr. Caryl Comes in the past and he advised no antiarrhythmics or ablation. Nuclear stress test normal 2005.  Patient says she's had fluttering more frequently that last about 5 seconds. There are no different than the palpitation she's had in the past. She has some lightheadedness but not necessarily related to the fluttering. She denies any chest pain, dyspnea, or presyncope. She has been under a lot of stress with family illnesses since the summer. She has gained 25 pounds because of eating fast food and not exercising much. She had lost 75 pounds in the past.  UC active by colonoscopy 11/18/15 Pyrtle  Echo 05/07/15 normal EF 60-65%   Complains of dyspnea   Past Medical History:  Diagnosis Date  . Adrenal nodule ([place])   . Allergy   . Anemia    20 yrs. ago not now- pt denies   . Anxiety   . Arthritis    knees,thumbs  . Cardiac arrhythmia    palpitations,PVC'sAC  . Cataract    bilateral removed   . Cyst of kidney, acquired    cyst right kidney- benign  . Gallstones   . Morbid obesity ([place])   . Osteoporosis    knees  . Other allergy, other than to medicinal agents   . Panic disorder   . Ulcerative colitis, unspecified   . Vitamin B12 deficiency   . Vitamin D deficiency     Past Surgical History:  Procedure Laterality Date  . BREAST BIOPSY  11/2011   Left- benign  . CATARACT EXTRACTION Right   . CATARACT EXTRACTION Left   . CHOLECYSTECTOMY N/A 01/17/2014   Procedure: LAPAROSCOPIC CHOLECYSTECTOMY;  Surgeon: Excell Seltzer, MD;  Location: [facility];  Service:  General;  Laterality: N/A;  . COLONOSCOPY    . RETINAL LASER PROCEDURE Left   . SIGMOIDOSCOPY       Current Outpatient Prescriptions  Medication Sig Dispense Refill  . ALPRAZolam (XANAX) 1 MG tablet TAKE 1/2 TO 1 TABLET BY MOUTH TWICE A DAY AS NEEDED FOR ANXIETY 60 tablet 1  . atenolol (TENORMIN) 50 MG tablet Take 0.5 tablets (25 mg total) by mouth 2 (two) times daily. 90 tablet 3  . cholecalciferol (VITAMIN D) 1000 UNITS tablet Take 4,000 Units by mouth daily with lunch.     . dexamethasone (DECADRON) 1 MG tablet Take 1 tablet by mouth once at 11 pm, before coming for labs at 8 am the next morning 1 tablet 0  . ibuprofen (ADVIL,MOTRIN) 600 MG tablet TAKE ONE TABLET BY MOUTH THREE TIMES DAILY WITH FOOD AS NEEDED 42 tablet 1  . JINTELI 1-5 MG-MCG TABS Take 1 tablet by mouth daily.     Marland Kitchen loperamide (IMODIUM A-D) 2 MG tablet Take 2 mg by mouth 4 (four) times daily as needed for diarrhea or loose stools.    . ondansetron (ZOFRAN) 4 MG tablet Take 1 tablet (4 mg total) by mouth every 8 (eight) hours as needed for nausea or vomiting. 20 tablet 0  . Probiotic Product (PROBIOTIC DAILY PO) Take 2 capsules by  mouth daily with lunch.     . Simethicone (GAS-X PO) Take 1 tablet by mouth 4 (four) times daily as needed (For gas.). Do not exceed 6 tablets in a 24 hour period.    . vitamin B-12 (CYANOCOBALAMIN) 1000 MCG tablet Take 1 tablet (1,000 mcg total) by mouth daily. 30 tablet 6   No current facility-administered medications for this visit.     Allergies:   Doxycycline; Hydrocodone; Sudafed [pseudoephedrine hcl]; Acular [ketorolac tromethamine]; Diphenhydramine hcl; Ivp dye [iodinated diagnostic agents]; Mesalamine; Sulfasalazine; Azithromycin; Caffeine; Penicillins; and Prednisone    Social History:  The patient  reports that she quit smoking about 37 years ago. Her smoking use included Cigarettes. She has never used smokeless tobacco. She reports that she does not drink alcohol or use drugs.    Family History:  The patient's    family history includes Atrial fibrillation in her father; Heart disease (age of onset: 83) in her father.    ROS:  Please see the history of present illness.   Otherwise, review of systems are positive for none.   All other systems are reviewed and negative.    PHYSICAL EXAM: VS:  BP (!) 146/76   Pulse 69   Ht 5' 6"  (1.676 m)   Wt (!) 336 lb (152.4 kg)   SpO2 98%   BMI 54.23 kg/m  , BMI Body mass index is 54.23 kg/m. GEN: Obese in no acute distress  Neck: no JVD, HJR, carotid bruits, or masses Cardiac: RRR; no murmurs,gallop, rubs, thrill or heave,  Respiratory:  clear to auscultation bilaterally, normal work of breathing GI: soft, nontender, nondistended, + BS MS: no deformity or atrophy  Extremities: without cyanosis, clubbing, edema, good distal pulses bilaterally.  Skin: warm and dry, no rash Neuro:  Strength and sensation are intact    EKG:    Normal sinus rhythm, normal EKG   Recent Labs 12/25/2015: TSH 3.19 03/30/2016: ALT 14; BUN 8; Creatinine, Ser 0.84; Hemoglobin 13.2; Platelets 305.0; Sodium 141 04/14/2016: Pro B Natriuretic peptide (BNP) 51.0 05/06/2016: Potassium 4.1    Lipid Panel    Component Value Date/Time   CHOL 175 12/25/2015 0841   TRIG 103.0 12/25/2015 0841   HDL 40.30 12/25/2015 0841   CHOLHDL 4 12/25/2015 0841   VLDL 20.6 12/25/2015 0841   LDLCALC 114 (H) 12/25/2015 0841      Wt Readings from Last 3 Encounters:  05/19/16 (!) 336 lb (152.4 kg)  05/06/16 (!) 312 lb 12.8 oz (141.9 kg)  03/30/16 (!) 305 lb (138.3 kg)      Other studies Reviewed: Additional studies/ records that were reviewed today include and review of the records demonstrates:   Colonoscopy Pyrtle 11/2015 Echo 05/07/15    ASSESSMENT AND PLAN: No problem-specific Assessment & Plan notes found for this encounter.   1. PVC's:  Benign no history of CAD normal EF on echo continue beta blocker 2. UC:  F/u GI pro biotics zofran  3.  Adrenal Mass:  Nonspecific nodularity stable normal dex stim test  4. Dyspnea function BNP normal lungs clear discussed weight loss and exercise   Signed, Jenkins Rouge, MD  05/19/2016 8:44 AM    [facility] [address], [place], [place]  [address] Phone: [phone]; Fax: [phone]

## 2016-05-19 ENCOUNTER — Encounter: Payer: Self-pay | Admitting: Cardiovascular Disease

## 2016-05-19 ENCOUNTER — Ambulatory Visit (INDEPENDENT_AMBULATORY_CARE_PROVIDER_SITE_OTHER): Payer: 59 | Admitting: Cardiovascular Disease

## 2016-05-19 ENCOUNTER — Encounter: Payer: Self-pay | Admitting: Internal Medicine

## 2016-05-19 VITALS — BP 146/76 | HR 69 | Ht 66.0 in | Wt 336.0 lb

## 2016-05-19 DIAGNOSIS — I499 Cardiac arrhythmia, unspecified: Secondary | ICD-10-CM | POA: Diagnosis not present

## 2016-05-19 NOTE — Patient Instructions (Signed)

## 2016-05-23 ENCOUNTER — Encounter: Payer: Self-pay | Admitting: Internal Medicine

## 2016-05-25 ENCOUNTER — Ambulatory Visit: Payer: 59 | Admitting: Internal Medicine

## 2016-05-26 ENCOUNTER — Ambulatory Visit: Payer: 59 | Admitting: Internal Medicine

## 2016-05-31 ENCOUNTER — Other Ambulatory Visit: Payer: Self-pay | Admitting: Internal Medicine

## 2016-05-31 ENCOUNTER — Encounter: Payer: Self-pay | Admitting: Internal Medicine

## 2016-05-31 ENCOUNTER — Ambulatory Visit (INDEPENDENT_AMBULATORY_CARE_PROVIDER_SITE_OTHER): Payer: 59 | Admitting: Internal Medicine

## 2016-05-31 ENCOUNTER — Ambulatory Visit (INDEPENDENT_AMBULATORY_CARE_PROVIDER_SITE_OTHER)
Admission: RE | Admit: 2016-05-31 | Discharge: 2016-05-31 | Disposition: A | Discharge status: Home / Self Care | Payer: 59 | Source: Ambulatory Visit | Attending: Internal Medicine | Admitting: Internal Medicine

## 2016-05-31 VITALS — BP 140/70 | HR 75 | Temp 97.5°F | Resp 18 | Ht 66.0 in | Wt 309.0 lb

## 2016-05-31 DIAGNOSIS — R0600 Dyspnea, unspecified: Secondary | ICD-10-CM | POA: Diagnosis not present

## 2016-05-31 DIAGNOSIS — R0609 Other forms of dyspnea: Secondary | ICD-10-CM | POA: Insufficient documentation

## 2016-05-31 DIAGNOSIS — I872 Venous insufficiency (chronic) (peripheral): Secondary | ICD-10-CM | POA: Diagnosis not present

## 2016-05-31 DIAGNOSIS — R0602 Shortness of breath: Secondary | ICD-10-CM

## 2016-05-31 LAB — POCT EXHALED NITRIC OXIDE: FENO LEVEL (PPB): 12

## 2016-05-31 MED ORDER — ALBUTEROL SULFATE HFA 108 (90 BASE) MCG/ACT IN AERS: 2.0000 | INHALATION_SPRAY | Freq: Four times a day (QID) | RESPIRATORY_TRACT | 0 refills | Status: DC | PRN

## 2016-05-31 NOTE — Patient Instructions (Addendum)
We will plan to get a chest x-ray today to check for fluid on the lungs or other problems.   We will also have you do the full lung test to look for COPD or emphysema.   For the swelling we can do compression stockings to help with the swelling.

## 2016-05-31 NOTE — Progress Notes (Signed)
Pre visit review using our clinic review tool, if applicable. No additional management support is needed unless otherwise documented below in the visit note. 

## 2016-05-31 NOTE — Assessment & Plan Note (Signed)
Suspect deconditioning. She is overweight and not much change in her weight recently (she denies the weight checked in cardiology as inaccurate). BNP normal (which could be falsely low with obesity but echo 1 year ago without signs of heart failure). Checking CXR for fluid or large heart today. Will also get full PFT to rule out emphysema. She is asked to make an effort to exercise and do some activity to see if she can increase her endurance.

## 2016-05-31 NOTE — Progress Notes (Signed)
   Subjective:    Patient ID: Cassie [name], female    DOB: [birth year], 56 y.o.   MRN: [id number]  HPI The patient is a 56 YO female coming in for SOB with exertion. Even short distances cause her to get out of breath. She is able to rest about 1-2 minutes and breath returns. Recently saw her cardiologist and they told her it was not her heart and did EKG. Prior BNP normal. Echo done 1 year ago was normal without any systolic or diastolic dysfunction. She has been off her feet since the fall with knee injury and little to no activity. She does have history of hiatal hernia which has been bothering her more with some pain with eating and drinking. She does also have leg swelling. This used to be only in the summer and now is with any amount of prolonged sitting. She is able to elevate them to reduce the fluid. She is concerned about this with her heart.   Review of Systems  Constitutional: Positive for activity change. Negative for appetite change, chills, fatigue, fever and unexpected weight change.  HENT: Negative.   Respiratory: Positive for shortness of breath. Negative for apnea, cough, choking, chest tightness and wheezing.   Cardiovascular: Positive for palpitations and leg swelling. Negative for chest pain.       Unchanged  Gastrointestinal: Negative.   Musculoskeletal: Positive for arthralgias, gait problem and myalgias. Negative for back pain, joint swelling, neck pain and neck stiffness.  Skin: Negative.   Neurological: Negative for dizziness, tremors, syncope, weakness, light-headedness and headaches.      Objective:   Physical Exam  Constitutional: She is oriented to person, place, and time. She appears well-developed and well-nourished.  Obese  HENT:  Head: Normocephalic and atraumatic.  Eyes: EOM are normal.  Neck: Normal range of motion.  Cardiovascular: Normal rate and regular rhythm.   No murmur heard. Pulmonary/Chest: Effort normal and breath sounds normal. No  respiratory distress. She has no wheezes. She has no rales. She exhibits no tenderness.  Mild dyspnea with walking from waiting area to room which calms in 1-2 minutes. Able to talk in full sentences.   Abdominal: Soft.  Musculoskeletal: She exhibits edema.  1+ edema to the mid shin bilaterally which is non-pitting.   Neurological: She is alert and oriented to person, place, and time.  Skin: Skin is warm and dry.  Psychiatric:  Concerned about her symptoms.    Vitals:   05/31/16 0800  BP: 140/70  Pulse: 75  Resp: 18  Temp: 97.5 F (36.4 C)  TempSrc: Oral  SpO2: 99%  Weight: (!) 309 lb (140.2 kg)  Height: 5' 6"  (1.676 m)  Feno: 12 which does not indicate asthma or inflammation in the lungs.     Assessment & Plan:

## 2016-05-31 NOTE — Assessment & Plan Note (Signed)
We did discuss how the swelling is likely venous insufficiency and can worsen with time. Rx for compression stockings given at the visit. Reviewed last labs and no kidney or liver dysfunction and last echo without heart failure and BNP normal during episode of swelling.

## 2016-06-15 ENCOUNTER — Encounter: Payer: Self-pay | Admitting: Internal Medicine

## 2016-06-15 ENCOUNTER — Other Ambulatory Visit: Payer: Self-pay | Admitting: Internal Medicine

## 2016-06-20 ENCOUNTER — Other Ambulatory Visit: Payer: 59

## 2016-06-20 DIAGNOSIS — E278 Other specified disorders of adrenal gland: Secondary | ICD-10-CM

## 2016-06-22 LAB — CORTISOL-AM, BLOOD: Cortisol - AM: 2.4 ug/dL — ABNORMAL LOW

## 2016-06-27 ENCOUNTER — Telehealth: Payer: Self-pay | Admitting: Internal Medicine

## 2016-06-27 NOTE — Telephone Encounter (Signed)
If she has had flu she really should wait at least 4 weeks to breathing test to be accurate.

## 2016-06-27 NOTE — Telephone Encounter (Signed)
Patient contacted and stated awareness

## 2016-06-27 NOTE — Telephone Encounter (Signed)
Patient states she is schedule for a breathing test with [facility]m this week.  Patient states she called [facility] to reschedule b/c she has the flu.  States Pulm  told patient to call Dr.Crawford to get her to reschedule because they could not reschedule her next week but stated Dr. Sharlet Salina could request for patient to be seen next week to get patient in sooner.  States Dr. Sharlet Salina requested her to have breathing test originally within two weeks but [facility]m could only get patient in within 4 weeks.

## 2016-07-04 ENCOUNTER — Observation Stay (HOSPITAL_COMMUNITY)
Admission: EM | Admit: 2016-07-04 | Discharge: 2016-07-05 | Disposition: A | Discharge status: Home / Self Care | Payer: 59 | Attending: Internal Medicine | Admitting: Internal Medicine

## 2016-07-04 ENCOUNTER — Emergency Department (HOSPITAL_COMMUNITY): Payer: 59

## 2016-07-04 ENCOUNTER — Encounter (HOSPITAL_COMMUNITY): Payer: Self-pay

## 2016-07-04 ENCOUNTER — Other Ambulatory Visit (HOSPITAL_COMMUNITY): Payer: 59

## 2016-07-04 ENCOUNTER — Observation Stay (HOSPITAL_BASED_OUTPATIENT_CLINIC_OR_DEPARTMENT_OTHER): Payer: 59

## 2016-07-04 DIAGNOSIS — I10 Essential (primary) hypertension: Secondary | ICD-10-CM

## 2016-07-04 DIAGNOSIS — R0602 Shortness of breath: Secondary | ICD-10-CM | POA: Diagnosis not present

## 2016-07-04 DIAGNOSIS — M810 Age-related osteoporosis without current pathological fracture: Secondary | ICD-10-CM | POA: Diagnosis not present

## 2016-07-04 DIAGNOSIS — Z780 Asymptomatic menopausal state: Secondary | ICD-10-CM | POA: Diagnosis not present

## 2016-07-04 DIAGNOSIS — R4589 Other symptoms and signs involving emotional state: Secondary | ICD-10-CM | POA: Diagnosis present

## 2016-07-04 DIAGNOSIS — Z79890 Hormone replacement therapy (postmenopausal): Secondary | ICD-10-CM | POA: Insufficient documentation

## 2016-07-04 DIAGNOSIS — F410 Panic disorder [episodic paroxysmal anxiety] without agoraphobia: Secondary | ICD-10-CM | POA: Diagnosis not present

## 2016-07-04 DIAGNOSIS — R002 Palpitations: Secondary | ICD-10-CM

## 2016-07-04 DIAGNOSIS — R079 Chest pain, unspecified: Secondary | ICD-10-CM | POA: Diagnosis not present

## 2016-07-04 DIAGNOSIS — I2699 Other pulmonary embolism without acute cor pulmonale: Secondary | ICD-10-CM | POA: Diagnosis not present

## 2016-07-04 DIAGNOSIS — Z6841 Body Mass Index (BMI) 40.0 and over, adult: Secondary | ICD-10-CM | POA: Diagnosis not present

## 2016-07-04 DIAGNOSIS — E559 Vitamin D deficiency, unspecified: Secondary | ICD-10-CM | POA: Insufficient documentation

## 2016-07-04 DIAGNOSIS — Z87891 Personal history of nicotine dependence: Secondary | ICD-10-CM | POA: Insufficient documentation

## 2016-07-04 DIAGNOSIS — Z79899 Other long term (current) drug therapy: Secondary | ICD-10-CM | POA: Diagnosis not present

## 2016-07-04 DIAGNOSIS — Z86711 Personal history of pulmonary embolism: Secondary | ICD-10-CM

## 2016-07-04 DIAGNOSIS — R0609 Other forms of dyspnea: Secondary | ICD-10-CM

## 2016-07-04 DIAGNOSIS — F418 Other specified anxiety disorders: Secondary | ICD-10-CM | POA: Diagnosis present

## 2016-07-04 DIAGNOSIS — E538 Deficiency of other specified B group vitamins: Secondary | ICD-10-CM | POA: Diagnosis not present

## 2016-07-04 LAB — CBC
HCT: 41.5 % (ref 36.0–46.0)
HEMOGLOBIN: 13.4 g/dL (ref 12.0–15.0)
MCH: 30.3 pg (ref 26.0–34.0)
MCHC: 32.3 g/dL (ref 30.0–36.0)
MCV: 93.9 fL (ref 78.0–100.0)
PLATELETS: 290 10*3/uL (ref 150–400)
RBC: 4.42 MIL/uL (ref 3.87–5.11)
RDW: 13.4 % (ref 11.5–15.5)
WBC: 8.4 10*3/uL (ref 4.0–10.5)

## 2016-07-04 LAB — ANTITHROMBIN III: ANTITHROMB III FUNC: 88 % (ref 75–120)

## 2016-07-04 LAB — BASIC METABOLIC PANEL
ANION GAP: 10 (ref 5–15)
BUN: 6 mg/dL (ref 6–20)
CALCIUM: 9.3 mg/dL (ref 8.9–10.3)
CO2: 26 mmol/L (ref 22–32)
Chloride: 103 mmol/L (ref 101–111)
Creatinine, Ser: 0.93 mg/dL (ref 0.44–1.00)
Glucose, Bld: 136 mg/dL — ABNORMAL HIGH (ref 65–99)
Potassium: 3.5 mmol/L (ref 3.5–5.1)
SODIUM: 139 mmol/L (ref 135–145)

## 2016-07-04 LAB — ECHOCARDIOGRAM COMPLETE
AVLVOTPG: 4 mmHg
CHL CUP DOP CALC LVOT VTI: 25 cm
CHL CUP MV DEC (S): 208
E decel time: 208 msec
FS: 30 % (ref 28–44)
HEIGHTINCHES: 66 in
IV/PV OW: 0.83
LA vol: 63.2 mL
LASIZE: 37 mm
LAVOLA4C: 61.3 mL
LDCA: 3.46 cm2
LEFT ATRIUM END SYS DIAM: 37 mm
LV PW d: 12 mm — AB (ref 0.6–1.1)
LV TDI E'LATERAL: 10.8
LV TDI E'MEDIAL: 9.79
LVELAT: 10.8 cm/s
LVOT SV: 87 mL
LVOT diameter: 21 mm
LVOT peak vel: 103 cm/s
Lateral S' vel: 16.9 cm/s
MV pk E vel: 1.9 m/s
TAPSE: 24.4 mm
Weight: 4862.4 oz

## 2016-07-04 LAB — I-STAT TROPONIN, ED
TROPONIN I, POC: 0 ng/mL (ref 0.00–0.08)
Troponin i, poc: 0 ng/mL (ref 0.00–0.08)

## 2016-07-04 LAB — D-DIMER, QUANTITATIVE (NOT AT ARMC): D DIMER QUANT: 1.31 ug{FEU}/mL — AB (ref 0.00–0.50)

## 2016-07-04 LAB — TROPONIN I

## 2016-07-04 LAB — BRAIN NATRIURETIC PEPTIDE: B Natriuretic Peptide: 54.6 pg/mL (ref 0.0–100.0)

## 2016-07-04 MED ORDER — RISAQUAD PO CAPS: ORAL_CAPSULE | Freq: Every day | ORAL | Status: DC

## 2016-07-04 MED ORDER — ATENOLOL 25 MG PO TABS
25.0000 mg | ORAL_TABLET | Freq: Two times a day (BID) | ORAL | Status: DC
  Filled 2016-07-04: qty 1

## 2016-07-04 MED ORDER — LOPERAMIDE HCL 2 MG PO CAPS: 2.0000 mg | ORAL_CAPSULE | Freq: Four times a day (QID) | ORAL | Status: DC | PRN

## 2016-07-04 MED ORDER — IOPAMIDOL (ISOVUE-370) INJECTION 76%
INTRAVENOUS | Status: AC
  Administered 2016-07-04: 100 mL
  Filled 2016-07-04: qty 100

## 2016-07-04 MED ORDER — HEPARIN BOLUS VIA INFUSION
5500.0000 [IU] | Freq: Once | INTRAVENOUS | Status: AC
  Administered 2016-07-04: 5500 [IU] via INTRAVENOUS
  Filled 2016-07-04: qty 5500

## 2016-07-04 MED ORDER — APIXABAN 5 MG PO TABS
10.0000 mg | ORAL_TABLET | Freq: Two times a day (BID) | ORAL | Status: DC
  Administered 2016-07-04 – 2016-07-05 (×3): 10 mg via ORAL
  Filled 2016-07-04 (×3): qty 2

## 2016-07-04 MED ORDER — APIXABAN 5 MG PO TABS: 5.0000 mg | ORAL_TABLET | Freq: Two times a day (BID) | ORAL | Status: DC

## 2016-07-04 MED ORDER — SODIUM CHLORIDE 0.9 % IV SOLN: 250.0000 mL | INTRAVENOUS | Status: DC | PRN

## 2016-07-04 MED ORDER — SODIUM CHLORIDE 0.9% FLUSH: 3.0000 mL | INTRAVENOUS | Status: DC | PRN

## 2016-07-04 MED ORDER — ACETAMINOPHEN 650 MG RE SUPP: 650.0000 mg | Freq: Four times a day (QID) | RECTAL | Status: DC | PRN

## 2016-07-04 MED ORDER — ONDANSETRON HCL 4 MG PO TABS: 4.0000 mg | ORAL_TABLET | Freq: Four times a day (QID) | ORAL | Status: DC | PRN

## 2016-07-04 MED ORDER — ACETAMINOPHEN 325 MG PO TABS: 650.0000 mg | ORAL_TABLET | Freq: Four times a day (QID) | ORAL | Status: DC | PRN

## 2016-07-04 MED ORDER — SIMETHICONE 80 MG PO CHEW: 80.0000 mg | CHEWABLE_TABLET | Freq: Four times a day (QID) | ORAL | Status: DC | PRN

## 2016-07-04 MED ORDER — ALPRAZOLAM 0.5 MG PO TABS
0.5000 mg | ORAL_TABLET | Freq: Two times a day (BID) | ORAL | Status: DC | PRN
  Administered 2016-07-04: 1 mg via ORAL
  Administered 2016-07-04 – 2016-07-05 (×2): 0.5 mg via ORAL
  Filled 2016-07-04 (×2): qty 1
  Filled 2016-07-04: qty 2

## 2016-07-04 MED ORDER — HEPARIN (PORCINE) IN NACL 100-0.45 UNIT/ML-% IJ SOLN
1600.0000 [IU]/h | INTRAMUSCULAR | Status: DC
  Administered 2016-07-04: 1600 [IU]/h via INTRAVENOUS
  Filled 2016-07-04: qty 250

## 2016-07-04 MED ORDER — APIXABAN 5 MG PO TABS: 10.0000 mg | ORAL_TABLET | Freq: Two times a day (BID) | ORAL | Status: DC

## 2016-07-04 MED ORDER — ONDANSETRON HCL 4 MG/2ML IJ SOLN: 4.0000 mg | Freq: Four times a day (QID) | INTRAMUSCULAR | Status: DC | PRN

## 2016-07-04 MED ORDER — SODIUM CHLORIDE 0.9% FLUSH
3.0000 mL | Freq: Two times a day (BID) | INTRAVENOUS | Status: DC
  Administered 2016-07-04 – 2016-07-05 (×2): 3 mL via INTRAVENOUS

## 2016-07-04 NOTE — Progress Notes (Signed)
ANTICOAGULATION CONSULT NOTE - Initial Consult  Pharmacy Consult for Heparin Indication: pulmonary embolus  Allergies  Allergen Reactions  . Doxycycline Palpitations  . Hydrocodone Other (See Comments)    Hallucinations   . Sudafed [Pseudoephedrine Hcl] Shortness Of Breath  . Acular [Ketorolac Tromethamine]     Unknown per pt   . Diphenhydramine Hcl Swelling    Throat feels like it swells   . Ivp Dye [Iodinated Diagnostic Agents]     Irregular heart beat   . Mesalamine     REACTION: unspecified. Does not remember taking. Hair loss-thinks this was in an enema in 2004  . Sulfasalazine Diarrhea  . Azithromycin Palpitations    Speeding heart rate per pt.   . Caffeine Palpitations  . Penicillins Rash    REACTION: unspecified  . Prednisone Palpitations    REACTION: unspecified    Patient Measurements:    Ht 66 in  Wt : 140 kg  IBW: 59 Heparin Dosing Weight: 94 kg  Vital Signs: Temp: 98.7 F (37.1 C) (02/26 0149) Temp Source: Oral (02/26 0149) BP: 138/71 (02/26 0500) Pulse Rate: 60 (02/26 0500)  Labs:  Recent Labs  07/04/16 0200  HGB 13.4  HCT 41.5  PLT 290  CREATININE 0.93    CrCl cannot be calculated (Unknown ideal weight.).   Medical History: Past Medical History:  Diagnosis Date  . Adrenal nodule ([place])   . Allergy   . Anemia    20 yrs. ago not now- pt denies   . Anxiety   . Arthritis    knees,thumbs  . Cardiac arrhythmia    palpitations,PVC'sAC  . Cataract    bilateral removed   . Cyst of kidney, acquired    cyst right kidney- benign  . Gallstones   . Morbid obesity ([place])   . Osteoporosis    knees  . Other allergy, other than to medicinal agents   . Panic disorder   . Ulcerative colitis, unspecified   . Vitamin B12 deficiency   . Vitamin D deficiency     Medications:  Awaiting home med rec  Assessment: 56 y.o. F presents with SOB and palpitations. Found to have RLL PE on CT. No evidence of R heart strain. To begin heparin gtt. No  AC PTA. CBC ok on admission.  Goal of Therapy:  Heparin level 0.3-0.7 units/ml Monitor platelets by anticoagulation protocol: Yes   Plan:  Heparin IV bolus 5500 units Heparin gtt at 1600 units/hr Will f/u heparin level in 6 hours Daily heparin level and CBC  Sherlon Handing, PharmD, BCPS Clinical pharmacist, pager [phone] 07/04/2016,6:11 AM

## 2016-07-04 NOTE — H&P (Signed)
History and Physical    Cassie [name] TMA:[id number] DOB: 1960/05/12 DOA: 07/04/2016  PCP: Hoyt Koch, MD Patient coming from: home  Chief Complaint: sob  HPI: Cassie [name] is a 56 y.o. female with medical history significant of PVC/PACs, gallstones, morbid obesity, UC, presenting w/ vague complaints of SOB and palpitations. Patient endorsing several month history of shortness of breath for which she has undergone an outpatient workup w/o significant findings. PFTs pending. Patient states that her shortness of breath has worsened over the last week. This is described as becoming dyspneic after ambulating approximately 20 feet. Relieved with rest. Patient states that the night prior to admission she developed a very rapid heartbeat which made her feel very lightheaded as if she is going to pass out. This is associated with an"odd feeling in my chest" the patient is unable to otherwise explain. Denies any chest pain, fevers, nausea, vomiting, dysuria, frequency. Patient does endorse lower extremity swelling that is no longer relieved with leg elevation. This is bilateral. Pt does endorse URI sx approximately 1 week prior to episode that caused her to be fairly bed bound  Patient does endorse having a stress test approximately 10 years ago that was reportedly normal. Sees a cardiologist yearly.    ED Course: PE noted on CTA. Started on heparin drip  Review of Systems: As per HPI otherwise 10 point review of systems negative.   Ambulatory Status: limited by OSA of knees  Past Medical History:  Diagnosis Date  . Adrenal nodule ([place])   . Allergy   . Anemia    20 yrs. ago not now- pt denies   . Anxiety   . Arthritis    knees,thumbs  . Cardiac arrhythmia    palpitations,PVC'sAC  . Cataract    bilateral removed   . Cyst of kidney, acquired    cyst right kidney- benign  . Gallstones   . Morbid obesity ([place])   . Osteoporosis    knees  . Other allergy, other than to  medicinal agents   . Panic disorder   . Ulcerative colitis, unspecified   . Vitamin B12 deficiency   . Vitamin D deficiency     Past Surgical History:  Procedure Laterality Date  . BREAST BIOPSY  11/2011   Left- benign  . CATARACT EXTRACTION Right   . CATARACT EXTRACTION Left   . CHOLECYSTECTOMY N/A 01/17/2014   Procedure: LAPAROSCOPIC CHOLECYSTECTOMY;  Surgeon: Excell Seltzer, MD;  Location: [facility];  Service: General;  Laterality: N/A;  . COLONOSCOPY    . RETINAL LASER PROCEDURE Left   . SIGMOIDOSCOPY      Social History   Social History  . Marital status: Married    Spouse name: N/A  . Number of children: 0  . Years of education: N/A   Occupational History  . Housewife Unemployed   Social History Main Topics  . Smoking status: Former Smoker    Types: Cigarettes    Quit date: 03/07/1979  . Smokeless tobacco: Never Used  . Alcohol use No  . Drug use: No  . Sexual activity: Yes    Partners: Male   Other Topics Concern  . Not on file   Social History Narrative   Married: 0 kids   Regular exercise: walks 2 times a week   Caffeine use: none    Allergies  Allergen Reactions  . Doxycycline Palpitations  . Hydrocodone Other (See Comments)    Hallucinations   . Sudafed [Pseudoephedrine Hcl] Shortness Of Breath  .  Acular [Ketorolac Tromethamine]     Unknown reaction   . Diphenhydramine Hcl Swelling    Throat feels like it swells   . Ivp Dye [Iodinated Diagnostic Agents]     Irregular heart beat - reaction to IV for CT - not oral    . Lactose Intolerance (Gi) Diarrhea and Other (See Comments)    bloating  . Mesalamine Other (See Comments)    Hair loss  . Sulfasalazine Diarrhea  . Azithromycin Palpitations    Speeding heart rate per pt.   . Caffeine Palpitations  . Penicillins Rash    Has patient had a PCN reaction causing immediate rash, facial/tongue/throat swelling, SOB or lightheadedness with hypotension: Yes Has patient had a PCN reaction causing  severe rash involving mucus membranes or skin necrosis: No Has patient had a PCN reaction that required hospitalization Yes Has patient had a PCN reaction occurring within the last 10 years: No If all of the above answers are "NO", then may proceed with Cephalosporin use.  . Prednisone Palpitations    Family History  Problem Relation Age of Onset  . Heart disease Father 17    pacemaker  . Atrial fibrillation Father   . Colon cancer Neg Hx   . Stomach cancer Neg Hx   . Colon polyps Neg Hx   . Esophageal cancer Neg Hx   . Rectal cancer Neg Hx     Prior to Admission medications   Medication Sig Start Date End Date Taking? Authorizing Provider  ALPRAZolam (XANAX) 1 MG tablet TAKE 1/2 TO 1 TABLET BY MOUTH 2 TIMES A DAY AS NEEDED FOR ANXIETY Patient taking differently: TAKE 1/2 TABLET BY MOUTH EVERY MORNING AND 1 TABLET AT BEDTIME 06/16/16  Yes Hoyt Koch, MD  atenolol (TENORMIN) 50 MG tablet Take 0.5 tablets (25 mg total) by mouth 2 (two) times daily. 01/29/16  Yes Hoyt Koch, MD  Cholecalciferol (VITAMIN D3) 2000 units capsule Take 4,000 Units by mouth daily with lunch.   Yes Historical Provider, MD  dexamethasone (DECADRON) 1 MG tablet Take 1 tablet by mouth once at 11 pm, before coming for labs at 8 am the next morning Patient taking differently: Take 1 mg by mouth See admin instructions. For tests by endocrinologist once a year: Take 1 tablet (1 mg) by mouth once at 11 pm before coming for labs at 8 am the next morning 05/06/16  Yes Philemon Kingdom, MD  ibuprofen (ADVIL,MOTRIN) 600 MG tablet TAKE ONE TABLET BY MOUTH THREE TIMES DAILY WITH FOOD AS NEEDED Patient taking differently: Take 600 mg by mouth 3 (three) times daily as needed (pain).  12/25/14  Yes Hoyt Koch, MD  loperamide (IMODIUM A-D) 2 MG tablet Take 2 mg by mouth 4 (four) times daily as needed for diarrhea or loose stools.   Yes Historical Provider, MD  norethindrone-ethinyl estradiol (JINTELI) 1-5  MG-MCG TABS tablet Take 1 tablet by mouth at bedtime.   Yes Historical Provider, MD  Polyethyl Glycol-Propyl Glycol (SYSTANE OP) Place 1 drop into both eyes daily as needed (dry eyes).   Yes Historical Provider, MD  Probiotic Product (PROBIOTIC DAILY PO) Take 2 capsules by mouth daily with lunch.    Yes Historical Provider, MD  Simethicone (GAS-X PO) Take 1 tablet by mouth 4 (four) times daily as needed (For gas.). Do not exceed 6 tablets in a 24 hour period.   Yes Historical Provider, MD  vitamin B-12 (CYANOCOBALAMIN) 500 MCG tablet Take 500 mcg by mouth daily with lunch.  Yes Historical Provider, MD  albuterol (PROVENTIL HFA;VENTOLIN HFA) 108 (90 Base) MCG/ACT inhaler Inhale 2 puffs into the lungs every 6 (six) hours as needed for wheezing or shortness of breath. Patient not taking: Reported on 07/04/2016 05/31/16   Hoyt Koch, MD  vitamin B-12 (CYANOCOBALAMIN) 1000 MCG tablet Take 1 tablet (1,000 mcg total) by mouth daily. Patient not taking: Reported on 07/04/2016 06/27/14   Hoyt Koch, MD    Physical Exam: Vitals:   07/04/16 0745 07/04/16 0801 07/04/16 0815 07/04/16 0821  BP:  (!) 114/102  134/64  Pulse: (!) 59   (!) 57  Resp: 22 20  (!) 21  Temp:    99 F (37.2 C)  TempSrc:    Oral  SpO2: 99%   98%  Weight:   (!) 137.8 kg (303 lb 14.4 oz)   Height:   5' 6"  (1.676 m)      General:  Appears calm and comfortable Eyes:  PERRL, EOMI, normal lids, iris ENT:  grossly normal hearing, lips & tongue, mmm Neck:  no LAD, masses or thyromegaly Cardiovascular:  RRR, no m/r/g. 1+ LE edema.  Respiratory:  CTA bilaterally, no w/r/r. Normal respiratory effort. Abdomen:  soft, ntnd, NABS Skin:  no rash or induration seen on limited exam Musculoskeletal:  grossly normal tone BUE/BLE, good ROM, no bony abnormality Psychiatric:  grossly normal mood and affect, speech fluent and appropriate, AOx3 Neurologic:  CN 2-12 grossly intact, moves all extremities in coordinated fashion,  sensation intact  Labs on Admission: I have personally reviewed following labs and imaging studies  CBC:  Recent Labs Lab 07/04/16 0200  WBC 8.4  HGB 13.4  HCT 41.5  MCV 93.9  PLT 086   Basic Metabolic Panel:  Recent Labs Lab 07/04/16 0200  NA 139  K 3.5  CL 103  CO2 26  GLUCOSE 136*  BUN 6  CREATININE 0.93  CALCIUM 9.3   GFR: Estimated Creatinine Clearance: 95.6 mL/min (by C-G formula based on SCr of 0.93 mg/dL). Liver Function Tests: No results for input(s): AST, ALT, ALKPHOS, BILITOT, PROT, ALBUMIN in the last 168 hours. No results for input(s): LIPASE, AMYLASE in the last 168 hours. No results for input(s): AMMONIA in the last 168 hours. Coagulation Profile: No results for input(s): INR, PROTIME in the last 168 hours. Cardiac Enzymes: No results for input(s): CKTOTAL, CKMB, CKMBINDEX, TROPONINI in the last 168 hours. BNP (last 3 results)  Recent Labs  04/14/16 0816  PROBNP 51.0   HbA1C: No results for input(s): HGBA1C in the last 72 hours. CBG: No results for input(s): GLUCAP in the last 168 hours. Lipid Profile: No results for input(s): CHOL, HDL, LDLCALC, TRIG, CHOLHDL, LDLDIRECT in the last 72 hours. Thyroid Function Tests: No results for input(s): TSH, T4TOTAL, FREET4, T3FREE, THYROIDAB in the last 72 hours. Anemia Panel: No results for input(s): VITAMINB12, FOLATE, FERRITIN, TIBC, IRON, RETICCTPCT in the last 72 hours. Urine analysis:    Component Value Date/Time   COLORURINE AMBER (A) 11/17/2014 2120   APPEARANCEUR CLOUDY (A) 11/17/2014 2120   LABSPEC 1.026 11/17/2014 2120   PHURINE 5.5 11/17/2014 2120   GLUCOSEU NEGATIVE 11/17/2014 2120   GLUCOSEU NEGATIVE 11/08/2012 0926   HGBUR MODERATE (A) 11/17/2014 2120   BILIRUBINUR neg 03/30/2016 1132   KETONESUR 15 (A) 11/17/2014 2120   PROTEINUR 15 03/30/2016 1132   PROTEINUR NEGATIVE 11/17/2014 2120   UROBILINOGEN 0.2 03/30/2016 1132   UROBILINOGEN 0.2 11/17/2014 2120   NITRITE neg  03/30/2016 1132  NITRITE NEGATIVE 11/17/2014 2120   LEUKOCYTESUR small (1+) (A) 03/30/2016 1132    Creatinine Clearance: Estimated Creatinine Clearance: 95.6 mL/min (by C-G formula based on SCr of 0.93 mg/dL).  Sepsis Labs: @LABRCNTIP (procalcitonin:4,lacticidven:4) )No results found for this or any previous visit (from the past 240 hour(s)).   Radiological Exams on Admission: Dg Chest 2 View  Result Date: 07/04/2016 CLINICAL DATA:  Chest pain and shortness of breath.  Palpitations. EXAM: CHEST  2 VIEW COMPARISON:  Radiographs 05/31/2009 FINDINGS: The heart is normal in size. Calcified mediastinal nodes are again seen. Mild bronchial thickening appears improved from prior exam. No confluent airspace disease. No pleural fluid, pneumothorax or pulmonary edema. No acute osseous abnormalities. IMPRESSION: Chronic bronchial thickening, unchanged from exam 1 month prior. No acute abnormality. Electronically Signed   By: Jeb Levering M.D.   On: 07/04/2016 02:36   Ct Angio Chest Pe W And/or Wo Contrast  Result Date: 07/04/2016 CLINICAL DATA:  Acute onset of shortness of breath and palpitations. Elevated D-dimer. Initial encounter. EXAM: CT ANGIOGRAPHY CHEST WITH CONTRAST TECHNIQUE: Multidetector CT imaging of the chest was performed using the standard protocol during bolus administration of intravenous contrast. Multiplanar CT image reconstructions and MIPs were obtained to evaluate the vascular anatomy. CONTRAST:  64 mL of Isovue 370 IV contrast COMPARISON:  Chest radiograph performed earlier today at 2:16 a.m. FINDINGS: Cardiovascular: There is pulmonary embolus to the right lower lobe. The RV/ LV ratio of approximately 0.85 remains below the cutoff for right heart strain. The heart is borderline normal in size. Scattered coronary artery calcifications are seen. The great vessels are unremarkable in appearance. Mediastinum/Nodes: Scattered calcified mediastinal nodes reflect remote granulomatous  disease. No pericardial effusion is identified. No mediastinal lymphadenopathy is seen. The visualized portions of the thyroid gland are unremarkable. No axillary lymphadenopathy is appreciated. Lungs/Pleura: Minimal scarring is noted at the left lung base. The lungs are otherwise clear. No focal consolidation, pleural effusion or pneumothorax is seen. No masses are identified. Upper Abdomen: The visualized portions of the liver and spleen are unremarkable. The patient is status post cholecystectomy, with clips noted at the gallbladder fossa. The visualized portions of the pancreas and gallbladder are unremarkable. Bilateral adrenal adenomas are noted. Musculoskeletal: No acute osseous abnormalities are identified. The visualized musculature is unremarkable in appearance. Review of the MIP images confirms the above findings. IMPRESSION: 1. Pulmonary embolus to the right lower lung lobe. No CT evidence for right heart strain. 2. Scattered coronary artery calcifications seen. 3. Calcified mediastinal nodes reflect remote granulomatous disease. 4. Incidental note of bilateral adrenal adenomas. Critical Value/emergent results were called by telephone at the time of interpretation on 07/04/2016 at 5:57 am to Dr. Thayer Jew, who verbally acknowledged these results. Electronically Signed   By: Garald Balding M.D.   On: 07/04/2016 05:57    EKG: Independently reviewed. Sinus rhythm, no ACS.   Assessment/Plan Active Problems:   Anxiety about health   PANIC DISORDER   Pulmonary embolism (HCC)   DOE (dyspnea on exertion)   Palpitations   Essential hypertension   PE: RLL. No heart strain. Fmhx of cousin w/ factor 5 deficiency. Suspect pts PE is provoked given morbid obesity w/ relative sendentary lifestyle, history of knee injury, recent URI causing patient to be in bed for several days and affect the patient is on estrogen replacement therapy. Discussed multiple anticoagulation modalities including the risks  and benefits. - Stop heparin drip and start Eliquis - factor 5, Protein S and C - LE duplex -  Echo - f/u PCP  DOE: report h/o nml cath ~10 years ago. Pt has put on >22lbs in past year and morbidly obese at baseline. Likely from physical deconditioning and obesity hypoventilation w/ possible COPD/EMphysema. Remote h/o smoking. - outpt PFTs - Stress per outpt cards (states that cardiologist deferred this study at her last appt.)  Palpitatins: h/o PVC/PACs. Arrhythmia vs strain from acute PE. Trop neg x2 - Tele - EKG in am - Trop x1  UC: no recent flares.  - continue imodium and probiotic  Anxiety/panic attacks: currently well controlled. Pt very anxious at baseline. - continue Xanax.   HTN: - continue pt on her home Atenolol (states she has to use her home meds - approved by pharmacy)  Menopause: on HRT - stop HRT and f/u PCP   DVT prophylaxis: eliquis  Code Status: full  Family Communication: husband  Disposition Plan: pending workup  Consults called: none  Admission status: observation - tele    Charly Holcomb J MD [facility]  If 7PM-7AM, please contact night-coverage [email or web address] Password [facility]  07/04/2016, 9:36 AM

## 2016-07-04 NOTE — ED Notes (Signed)
Patient transported to CT 

## 2016-07-04 NOTE — ED Provider Notes (Signed)
[facility] DEPT Provider Note   CSN: [id number] Arrival date & time: 07/04/16  0141  By signing my name below, I, Oleh Genin, attest that this documentation has been prepared under the direction and in the presence of Merryl Hacker, MD. Electronically Signed: Oleh Genin, Scribe. 07/04/16. 2:44 AM.   History   Chief Complaint Chief Complaint  Patient presents with  . Palpitations  . Shortness of Breath    HPI Cassie [name] is a 56 y.o. female with history of obesity who presents to the ED for evaluation of worsening dyspnea on exertion with palpitations. This patient states that she has experienced intermittent painless palpitations "for around 30 years". In the last 3 months she has also developed worsening dyspnea on exertion which previously "made her out of breath when she walked across the room". These complaints are ongoing and are followed by primary care and cardiology. Yesterday evening her symptoms were severe; when getting ready for bed she also had onset of palpitations with racing heartbeat and called EMS. At interview, she denies any chest pain. Her dyspnea improves while at rest. She denies any orthopnea; but does endorse bilateral pedal swelling. She does report flu-like symptoms last week. Quit tobacco 30 years ago.  Last stress test in 2005 which was negative. Echo in 2017 negative.  The history is provided by the patient. No language interpreter was used.    Past Medical History:  Diagnosis Date  . Adrenal nodule ([place])   . Allergy   . Anemia    20 yrs. ago not now- pt denies   . Anxiety   . Arthritis    knees,thumbs  . Cardiac arrhythmia    palpitations,PVC'sAC  . Cataract    bilateral removed   . Cyst of kidney, acquired    cyst right kidney- benign  . Gallstones   . Morbid obesity ([place])   . Osteoporosis    knees  . Other allergy, other than to medicinal agents   . Panic disorder   . Ulcerative colitis, unspecified   . Vitamin B12  deficiency   . Vitamin D deficiency     Patient Active Problem List   Diagnosis Date Noted  . SOB (shortness of breath) on exertion 05/31/2016  . Chronic venous insufficiency 05/31/2016  . UC (ulcerative colitis) ([place]) 01/28/2015  . Routine general medical examination at a health care facility 06/27/2014  . Left medial knee pain 07/17/2013  . Adrenal incidentaloma ([place]) 03/25/2013  . Allergic rhinitis 09/13/2011  . B12 deficiency 11/28/2008  . LIPOMA NEC 06/30/2008  . Vitamin D deficiency 06/30/2008  . Morbid obesity ([place]) 10/15/2007  . Anxiety about health 08/23/2007  . PANIC DISORDER 08/23/2007  . Cardiac dysrhythmia 08/23/2007    Past Surgical History:  Procedure Laterality Date  . BREAST BIOPSY  11/2011   Left- benign  . CATARACT EXTRACTION Right   . CATARACT EXTRACTION Left   . CHOLECYSTECTOMY N/A 01/17/2014   Procedure: LAPAROSCOPIC CHOLECYSTECTOMY;  Surgeon: Excell Seltzer, MD;  Location: [facility];  Service: General;  Laterality: N/A;  . COLONOSCOPY    . RETINAL LASER PROCEDURE Left   . SIGMOIDOSCOPY      OB History    No data available       Home Medications    Prior to Admission medications   Medication Sig Start Date End Date Taking? Authorizing Provider  albuterol (PROVENTIL HFA;VENTOLIN HFA) 108 (90 Base) MCG/ACT inhaler Inhale 2 puffs into the lungs every 6 (six) hours as needed for wheezing  or shortness of breath. 05/31/16   Hoyt Koch, MD  ALPRAZolam Duanne Moron) 1 MG tablet TAKE 1/2 TO 1 TABLET BY MOUTH 2 TIMES A DAY AS NEEDED FOR ANXIETY 06/16/16   Hoyt Koch, MD  atenolol (TENORMIN) 50 MG tablet Take 0.5 tablets (25 mg total) by mouth 2 (two) times daily. 01/29/16   Hoyt Koch, MD  cholecalciferol (VITAMIN D) 1000 UNITS tablet Take 4,000 Units by mouth daily with lunch.     Historical Provider, MD  dexamethasone (DECADRON) 1 MG tablet Take 1 tablet by mouth once at 11 pm, before coming for labs at 8 am the next morning 05/06/16    Philemon Kingdom, MD  ibuprofen (ADVIL,MOTRIN) 600 MG tablet TAKE ONE TABLET BY MOUTH THREE TIMES DAILY WITH FOOD AS NEEDED 12/25/14   Hoyt Koch, MD  JINTELI 1-5 MG-MCG TABS Take 1 tablet by mouth daily.  02/26/14   Historical Provider, MD  loperamide (IMODIUM A-D) 2 MG tablet Take 2 mg by mouth 4 (four) times daily as needed for diarrhea or loose stools.    Historical Provider, MD  Probiotic Product (PROBIOTIC DAILY PO) Take 2 capsules by mouth daily with lunch.     Historical Provider, MD  Simethicone (GAS-X PO) Take 1 tablet by mouth 4 (four) times daily as needed (For gas.). Do not exceed 6 tablets in a 24 hour period.    Historical Provider, MD  vitamin B-12 (CYANOCOBALAMIN) 1000 MCG tablet Take 1 tablet (1,000 mcg total) by mouth daily. 06/27/14   Hoyt Koch, MD    Family History Family History  Problem Relation Age of Onset  . Heart disease Father 37    pacemaker  . Atrial fibrillation Father   . Colon cancer Neg Hx   . Stomach cancer Neg Hx   . Colon polyps Neg Hx   . Esophageal cancer Neg Hx   . Rectal cancer Neg Hx     Social History Social History  Substance Use Topics  . Smoking status: Former Smoker    Types: Cigarettes    Quit date: 03/07/1979  . Smokeless tobacco: Never Used  . Alcohol use No     Allergies   Doxycycline; Hydrocodone; Sudafed [pseudoephedrine hcl]; Acular [ketorolac tromethamine]; Diphenhydramine hcl; Ivp dye [iodinated diagnostic agents]; Mesalamine; Sulfasalazine; Azithromycin; Caffeine; Penicillins; and Prednisone   Review of Systems Review of Systems  Constitutional: Negative for fever.  Respiratory: Positive for shortness of breath. Negative for cough.   Cardiovascular: Positive for palpitations and leg swelling. Negative for chest pain.  Gastrointestinal: Negative for abdominal pain, nausea and vomiting.  All other systems reviewed and are negative.    Physical Exam Updated Vital Signs BP 114/78   Pulse 66    Temp 98.7 F (37.1 C) (Oral)   Resp 16   SpO2 99%   Physical Exam  Constitutional: She is oriented to person, place, and time. No distress.  Morbidly obese, no acute distress  HENT:  Head: Normocephalic and atraumatic.  Cardiovascular: Normal rate, regular rhythm and normal heart sounds.   No murmur heard. Pulmonary/Chest: Effort normal and breath sounds normal. No respiratory distress. She has no wheezes.  Abdominal: Soft. Bowel sounds are normal. There is no tenderness. There is no guarding.  Musculoskeletal:  1+ lower extremity edema bilaterally  Neurological: She is alert and oriented to person, place, and time.  Skin: Skin is warm and dry.  Psychiatric: She has a normal mood and affect.  Nursing note and vitals reviewed.  ED Treatments / Results  DIAGNOSTIC STUDIES: Oxygen Saturation is 99 percent on room air which is normal by my interpretation.    COORDINATION OF CARE: 2:17 AM Discussed treatment plan with pt at bedside and pt agreed to plan.  Labs (all labs ordered are listed, but only abnormal results are displayed) Labs Reviewed  BASIC METABOLIC PANEL - Abnormal; Notable for the following:       Result Value   Glucose, Bld 136 (*)    All other components within normal limits  CBC  BRAIN NATRIURETIC PEPTIDE  D-DIMER, QUANTITATIVE (NOT AT [facility])  I-STAT TROPOININ, ED    EKG  EKG Interpretation  Date/Time:  Monday July 04 2016 01:47:42 EST Ventricular Rate:  67 PR Interval:  146 QRS Duration: 84 QT Interval:  430 QTC Calculation: 454 R Axis:   52 Text Interpretation:  Normal sinus rhythm Normal ECG Confirmed by Dina Rich  MD, Oluwasemilore Pascuzzi ([id number]) on 07/04/2016 2:10:37 AM       Radiology Dg Chest 2 View  Result Date: 07/04/2016 CLINICAL DATA:  Chest pain and shortness of breath.  Palpitations. EXAM: CHEST  2 VIEW COMPARISON:  Radiographs 05/31/2009 FINDINGS: The heart is normal in size. Calcified mediastinal nodes are again seen. Mild bronchial  thickening appears improved from prior exam. No confluent airspace disease. No pleural fluid, pneumothorax or pulmonary edema. No acute osseous abnormalities. IMPRESSION: Chronic bronchial thickening, unchanged from exam 1 month prior. No acute abnormality. Electronically Signed   By: Jeb Levering M.D.   On: 07/04/2016 02:36   Ct Angio Chest Pe W And/or Wo Contrast  Result Date: 07/04/2016 CLINICAL DATA:  Acute onset of shortness of breath and palpitations. Elevated D-dimer. Initial encounter. EXAM: CT ANGIOGRAPHY CHEST WITH CONTRAST TECHNIQUE: Multidetector CT imaging of the chest was performed using the standard protocol during bolus administration of intravenous contrast. Multiplanar CT image reconstructions and MIPs were obtained to evaluate the vascular anatomy. CONTRAST:  64 mL of Isovue 370 IV contrast COMPARISON:  Chest radiograph performed earlier today at 2:16 a.m. FINDINGS: Cardiovascular: There is pulmonary embolus to the right lower lobe. The RV/ LV ratio of approximately 0.85 remains below the cutoff for right heart strain. The heart is borderline normal in size. Scattered coronary artery calcifications are seen. The great vessels are unremarkable in appearance. Mediastinum/Nodes: Scattered calcified mediastinal nodes reflect remote granulomatous disease. No pericardial effusion is identified. No mediastinal lymphadenopathy is seen. The visualized portions of the thyroid gland are unremarkable. No axillary lymphadenopathy is appreciated. Lungs/Pleura: Minimal scarring is noted at the left lung base. The lungs are otherwise clear. No focal consolidation, pleural effusion or pneumothorax is seen. No masses are identified. Upper Abdomen: The visualized portions of the liver and spleen are unremarkable. The patient is status post cholecystectomy, with clips noted at the gallbladder fossa. The visualized portions of the pancreas and gallbladder are unremarkable. Bilateral adrenal adenomas are noted.  Musculoskeletal: No acute osseous abnormalities are identified. The visualized musculature is unremarkable in appearance. Review of the MIP images confirms the above findings. IMPRESSION: 1. Pulmonary embolus to the right lower lung lobe. No CT evidence for right heart strain. 2. Scattered coronary artery calcifications seen. 3. Calcified mediastinal nodes reflect remote granulomatous disease. 4. Incidental note of bilateral adrenal adenomas. Critical Value/emergent results were called by telephone at the time of interpretation on 07/04/2016 at 5:57 am to Dr. Thayer Jew, who verbally acknowledged these results. Electronically Signed   By: Garald Balding M.D.   On: 07/04/2016 05:57  Procedures Procedures (including critical care time)  Medications Ordered in ED Medications - No data to display   Initial Impression / Assessment and Plan / ED Course  I have reviewed the triage vital signs and the nursing notes.  Pertinent labs & imaging results that were available during my care of the patient were reviewed by me and considered in my medical decision making (see chart for details).     Patient presents with shortness of breath. Ongoing for several months. Worsening palpitations and shortness of breath with increasing dyspnea on exertion over the last week. She is nontoxic. Vital signs initially reassuring. Basic labwork obtained. EKG and troponin reassuring. D-dimer was positive. CT scan obtained. CT shows evidence of pulmonary embolism. No evidence of right heart strain. Heparin drip initiated. I have informed the patient of her diagnosis. She does report family history of pulmonary embolism and "may be a clotting disorder." She also reports that she is on hormone replacement therapy. Plan to admit to the hospitalist for further workup.    Final Clinical Impressions(s) / ED Diagnoses   Final diagnoses:  Other pulmonary embolism without acute cor pulmonale, unspecified chronicity ([place])     New Prescriptions New Prescriptions   No medications on file   I personally performed the services described in this documentation, which was scribed in my presence. The recorded information has been reviewed and is accurate.    Merryl Hacker, MD 07/05/16 [phone]

## 2016-07-04 NOTE — Progress Notes (Signed)
  Echocardiogram 2D Echocardiogram has been performed.  Johny Chess 07/04/2016, 5:10 PM

## 2016-07-04 NOTE — Progress Notes (Signed)
ANTICOAGULATION CONSULT NOTE - Initial Consult  Pharmacy Consult for Heparin transition to apixaban Indication: pulmonary embolus  Allergies  Allergen Reactions  . Doxycycline Palpitations  . Hydrocodone Other (See Comments)    Hallucinations   . Sudafed [Pseudoephedrine Hcl] Shortness Of Breath  . Acular [Ketorolac Tromethamine]     Unknown reaction   . Diphenhydramine Hcl Swelling    Throat feels like it swells   . Ivp Dye [Iodinated Diagnostic Agents]     Irregular heart beat - reaction to IV for CT - not oral    . Lactose Intolerance ([place]) Diarrhea and Other (See Comments)    bloating  . Mesalamine Other (See Comments)    Hair loss  . Sulfasalazine Diarrhea  . Azithromycin Palpitations    Speeding heart rate per pt.   . Caffeine Palpitations  . Penicillins Rash    Has patient had a PCN reaction causing immediate rash, facial/tongue/throat swelling, SOB or lightheadedness with hypotension: Yes Has patient had a PCN reaction causing severe rash involving mucus membranes or skin necrosis: No Has patient had a PCN reaction that required hospitalization Yes Has patient had a PCN reaction occurring within the last 10 years: No If all of the above answers are "NO", then may proceed with Cephalosporin use.  . Prednisone Palpitations    Patient Measurements: Height: 5' 6"  (167.6 cm) Weight: (!) 303 lb 14.4 oz (137.8 kg) IBW/kg (Calculated) : 59.3  Ht 66 in  Wt : 140 kg  IBW: 59 Heparin Dosing Weight: 94 kg  Vital Signs: Temp: 99 F (37.2 C) (02/26 0821) Temp Source: Oral (02/26 0821) BP: 134/64 (02/26 0821) Pulse Rate: 57 (02/26 0821)  Labs:  Recent Labs  07/04/16 0200  HGB 13.4  HCT 41.5  PLT 290  CREATININE 0.93    Estimated Creatinine Clearance: 95.6 mL/min (by C-G formula based on SCr of 0.93 mg/dL).   Medical History: Past Medical History:  Diagnosis Date  . Adrenal nodule ([place])   . Allergy   . Anemia    20 yrs. ago not now- pt denies   . Anxiety    . Arthritis    knees,thumbs  . Cardiac arrhythmia    palpitations,PVC'sAC  . Cataract    bilateral removed   . Cyst of kidney, acquired    cyst right kidney- benign  . Gallstones   . Morbid obesity ([place])   . Osteoporosis    knees  . Other allergy, other than to medicinal agents   . Panic disorder   . Ulcerative colitis, unspecified   . Vitamin B12 deficiency   . Vitamin D deficiency     Medications:  Awaiting home med rec  Assessment: 56 y.o. F presents with SOB and palpitations. Found to have RLL PE on CT. No evidence of R heart strain. To begin heparin gtt. No AC PTA. CBC ok on admission.  Pharmacy is consulted to transition patient to apixaban for PE.  Goal of Therapy:  Monitor platelets by anticoagulation protocol: Yes   Plan:  Stop heparin Start apixaban 88m BID for 7 days followed by 558mBID thereafter Monitor s/sx of bleeding Educate patient on apixaban  CoAndrey CotaBaDiona FoleyPharmD, BCPS Clinical Pharmacist #[phone]/26/2018,9:42 AM

## 2016-07-04 NOTE — Progress Notes (Signed)
Per Clinical biochemist for [employer]- S/W  [facility] @ [facility] #  [phone]    ELIQUIS 5 MG BID   COVER- YES  CO-PAY- $ 50.00  TIER- 3 DRUG  PRIOR APPROVAL- NO   PHARMACY : [facility]

## 2016-07-04 NOTE — ED Triage Notes (Signed)
Pt presents with worsening SOB and palpitations; ongoing since November; pt reports regularly seeing cardiologist and upcoming appt for pulmonary testing however cannot wait; pt speaking in full sentences; pt reporting that sob worsening impacting tasks that usually do not make her sob

## 2016-07-04 NOTE — Care Management Note (Signed)
Case Management Note Marvetta Gibbons RN, BSN Unit 2W-Case Manager [phone]  Patient Details  Name: HELEM REESOR MRN: [id number] Date of Birth: 09/11/1960  Subjective/Objective: Pt admitted with PE                 Action/Plan: PTA pt lived at home with spouse/family- independent- plan to return home- noted that pt to start Eliquis- insurance check completed - copay- $18- spoke with pt at bedside- pt given 30 day free card and also copay assist card- coverage info shared- pt would prefer to use [facility] on [address]- call made to pharmacy- however they do not have drug in stock to fill entire script- would have to order more to fill months supply. - call made to [facility] on [address]-  Which does have drug in stock to fill today. Call made to pt's room and she is aware of where to go to fill 30 day free prescription.    Expected Discharge Date:     07/04/16             Expected Discharge Plan:  Home/Self Care  In-House Referral:     Discharge planning Services  CM Consult, Medication Assistance  Post Acute Care Choice:  NA Choice offered to:  NA  DME Arranged:    DME Agency:     HH Arranged:    HH Agency:     Status of Service:  Completed, signed off  If discussed at [employer] of Stay Meetings, dates discussed:    Additional Comments:  Dawayne Patricia, RN 07/04/2016, 12:47 PM

## 2016-07-04 NOTE — ED Notes (Signed)
Dr. Marily Memos at bedside.

## 2016-07-04 NOTE — Progress Notes (Signed)
VASCULAR LAB PRELIMINARY  PRELIMINARY  PRELIMINARY  PRELIMINARY  Bilateral lower extremity venous duplex completed.    Preliminary report:  There is no DVT or SVT noted in the bilateral lower extremities.   Shacola Schussler, RVT 07/04/2016, 4:28 PM

## 2016-07-05 DIAGNOSIS — I2699 Other pulmonary embolism without acute cor pulmonale: Secondary | ICD-10-CM

## 2016-07-05 DIAGNOSIS — R002 Palpitations: Secondary | ICD-10-CM | POA: Diagnosis not present

## 2016-07-05 DIAGNOSIS — F418 Other specified anxiety disorders: Secondary | ICD-10-CM | POA: Diagnosis not present

## 2016-07-05 DIAGNOSIS — K5190 Ulcerative colitis, unspecified, without complications: Secondary | ICD-10-CM

## 2016-07-05 DIAGNOSIS — I10 Essential (primary) hypertension: Secondary | ICD-10-CM | POA: Diagnosis not present

## 2016-07-05 DIAGNOSIS — E279 Disorder of adrenal gland, unspecified: Secondary | ICD-10-CM

## 2016-07-05 DIAGNOSIS — R0609 Other forms of dyspnea: Secondary | ICD-10-CM | POA: Diagnosis not present

## 2016-07-05 DIAGNOSIS — F410 Panic disorder [episodic paroxysmal anxiety] without agoraphobia: Secondary | ICD-10-CM | POA: Diagnosis not present

## 2016-07-05 LAB — BASIC METABOLIC PANEL
ANION GAP: 9 (ref 5–15)
BUN: 6 mg/dL (ref 6–20)
CHLORIDE: 106 mmol/L (ref 101–111)
CO2: 27 mmol/L (ref 22–32)
Calcium: 9.2 mg/dL (ref 8.9–10.3)
Creatinine, Ser: 0.86 mg/dL (ref 0.44–1.00)
GFR calc Af Amer: >60 mL/min (ref >60)
GFR calc non Af Amer: >60 mL/min (ref >60)
Glucose, Bld: 94 mg/dL (ref 65–99)
POTASSIUM: 3.8 mmol/L (ref 3.5–5.1)
SODIUM: 142 mmol/L (ref 135–145)

## 2016-07-05 LAB — CBC
HCT: 39.4 % (ref 36.0–46.0)
HEMOGLOBIN: 12.4 g/dL (ref 12.0–15.0)
MCH: 29.8 pg (ref 26.0–34.0)
MCHC: 31.5 g/dL (ref 30.0–36.0)
MCV: 94.7 fL (ref 78.0–100.0)
PLATELETS: 284 10*3/uL (ref 150–400)
RBC: 4.16 MIL/uL (ref 3.87–5.11)
RDW: 13.7 % (ref 11.5–15.5)
WBC: 8.4 10*3/uL (ref 4.0–10.5)

## 2016-07-05 LAB — HOMOCYSTEINE: Homocysteine: 10.4 umol/L (ref 0.0–15.0)

## 2016-07-05 LAB — PROTEIN S ACTIVITY
Protein S Activity: 132 % (ref 63–140)
Protein S Activity: 155 % — ABNORMAL HIGH (ref 63–140)

## 2016-07-05 LAB — PROTEIN C ACTIVITY
PROTEIN C ACTIVITY: 104 % (ref 73–180)
PROTEIN C ACTIVITY: 105 % (ref 73–180)

## 2016-07-05 LAB — HIV ANTIBODY (ROUTINE TESTING W REFLEX): HIV Screen 4th Generation wRfx: NONREACTIVE

## 2016-07-05 LAB — FACTOR 9 ASSAY: Coagulation Factor IX: 154 % (ref 60–177)

## 2016-07-05 LAB — PROTEIN S, TOTAL
PROTEIN S AG TOTAL: 84 % (ref 60–150)
Protein S Ag, Total: 88 % (ref 60–150)

## 2016-07-05 MED ORDER — APIXABAN 5 MG PO TABS: 10.0000 mg | ORAL_TABLET | Freq: Two times a day (BID) | ORAL | 0 refills | Status: DC

## 2016-07-05 MED ORDER — APIXABAN 5 MG PO TABS: 5.0000 mg | ORAL_TABLET | Freq: Two times a day (BID) | ORAL | 0 refills | Status: DC

## 2016-07-05 MED ORDER — ATENOLOL 25 MG PO TABS
25.0000 mg | ORAL_TABLET | Freq: Two times a day (BID) | ORAL | Status: DC
Start: 2016-07-05 — End: 2016-07-05
  Administered 2016-07-05: 25 mg via ORAL

## 2016-07-05 NOTE — Discharge Summary (Signed)
Discharge Summary  Cassie [name] PJK:[id number] DOB: 03/28/1961  PCP: Hoyt Koch, MD  Admit date: 07/04/2016 Discharge date: 07/05/2016  Time spent: <74mns  Recommendations for Outpatient Follow-up:  1. F/u with PMD within a week  for hospital discharge follow up, repeat cbc/bmp at follow up, pmd to refer patient to have outpatient sleep study to r/o OSA, refer patient to have PFT for dyspnea 2. F/u with hematology/oncology Dr EMarin Olpfor PE  Discharge Diagnoses:  Active Hospital Problems   Diagnosis Date Noted  . Pulmonary embolism ([facility] 07/04/2016  . DOE (dyspnea on exertion) 07/04/2016  . Palpitations 07/04/2016  . Essential hypertension 07/04/2016  . Other pulmonary embolism without acute cor pulmonale ([facility]   . Anxiety about health 08/23/2007  . PANIC DISORDER 08/23/2007    Resolved Hospital Problems   Diagnosis Date Noted Date Resolved  No resolved problems to display.    Discharge Condition: stable  Diet recommendation: heart healthy  Filed Weights   07/04/16 0815 07/05/16 0523  Weight: (!) 137.8 kg (303 lb 14.4 oz) (!) 137.4 kg (302 lb 14.4 oz)    History of present illness:  PCP: EHoyt Koch MD Patient coming from: home  Chief Complaint: sob  HPI: Cassie SCHMIDis a 56y.o. female with medical history significant of PVC/PACs, gallstones, morbid obesity, UC, presenting w/ vague complaints of SOB and palpitations. Patient endorsing several month history of shortness of breath for which she has undergone an outpatient workup w/o significant findings. PFTs pending. Patient states that her shortness of breath has worsened over the last week. This is described as becoming dyspneic after ambulating approximately 20 feet. Relieved with rest. Patient states that the night prior to admission she developed a very rapid heartbeat which made her feel very lightheaded as if she is going to pass out. This is associated with an"odd feeling in my chest"  the patient is unable to otherwise explain. Denies any chest pain, fevers, nausea, vomiting, dysuria, frequency. Patient does endorse lower extremity swelling that is no longer relieved with leg elevation. This is bilateral. Pt does endorse URI sx approximately 1 week prior to episode that caused her to be fairly bed bound  Patient does endorse having a stress test approximately 10 years ago that was reportedly normal. Sees a cardiologist yearly.    ED Course: PE noted on CTA. Started on heparin drip  Hospital Course:  Active Problems:   Anxiety about health   PANIC DISORDER   Pulmonary embolism (HCC)   DOE (dyspnea on exertion)   Palpitations   Essential hypertension   Other pulmonary embolism without acute cor pulmonale (HCC)   Pulmonary Embolism: RLL. No heart strain. No hypoxia, no hemoptysis, no chest pain, bp and heart rate stable. Fmhx of cousin w/ factor 5 deficiency. Suspect pts PE is provoked given morbid obesity w/ relative sendentary lifestyle, history of knee injury, recent URI causing patient to be in bed for several days and affect the patient is on estrogen replacement therapy. Discussed multiple anticoagulation modalities including the risks and benefits. - d/cestrogen replacement therapy, she was treated with  heparin drip then transitioned to Eliquis -  LE duplex no DVT - Echo with pulmonic valve trivial regurgitation, mild regurgitaion at tricuspid valve, otherwise unremarkable. Hypercoagulable work up in process, patient is to follow up with Dr EMarin Olpfor these result. -  DOE: report h/o nml cath ~10 years ago. Pt has put on >22lbs in past year and morbidly obese at baseline. Likely from  physical deconditioning and obesity hypoventilation w/ possible COPD/EMphysema. Remote h/o smoking. - outpt PFTs - Stress per outpt cards (states that cardiologist deferred this study at her last appt.)  Palpitations: h/o PVC/PACs. Arrhythmia vs strain from acute PE. Trop  neg x2 - Tele unremarkable in the hospital - EKG in am - Trop negative x3, bnp 54.6 Continue atenolol, she is followed by cardiology yearly.  Ulercative Colitis : no recent flares. Denies abdominal pain, no fever , no blood in stool. - continue imodium and probiotic  Anxiety/panic attacks: currently well controlled. Pt very anxious at baseline. - continue Xanax.   HTN: - continue pt on her home Atenolol (states she has to use her home meds - approved by pharmacy) Patient report she never has hypertension, she report taking atenolol for palpitation  Menopause: on HRT - stop HRT and f/u PCP  Morbid obesity: Body mass index is 48.89 kg/m. Outpatient sleep study, weight loss management  DVT prophylaxis: eliquis  Code Status: full  Family Communication: husband at bedside Disposition Plan: home  Consults called: hematology Dr Marin Olp   Discharge Exam: BP 112/68 (BP Location: Left Arm)   Pulse 64   Temp 98.3 F (36.8 C) (Oral)   Resp 18   Ht 5' 6"  (1.676 m)   Wt (!) 137.4 kg (302 lb 14.4 oz) Comment: c scale  SpO2 98%   BMI 48.89 kg/m   General: NAD, obese Cardiovascular: RRR Respiratory: CTABL  Discharge Instructions You were cared for by a hospitalist during your hospital stay. If you have any questions about your discharge medications or the care you received while you were in the hospital after you are discharged, you can call the unit and asked to speak with the hospitalist on call if the hospitalist that took care of you is not available. Once you are discharged, your primary care physician will handle any further medical issues. Please note that NO REFILLS for any discharge medications will be authorized once you are discharged, as it is imperative that you return to your primary care physician (or establish a relationship with a primary care physician if you do not have one) for your aftercare needs so that they can reassess your need for medications and  monitor your lab values.  Discharge Instructions    Diet - low sodium heart healthy    Complete by:  As directed    Increase activity slowly    Complete by:  As directed      Allergies as of 07/05/2016      Reactions   Doxycycline Palpitations   Hydrocodone Other (See Comments)   Hallucinations    Sudafed [pseudoephedrine Hcl] Shortness Of Breath   Acular [ketorolac Tromethamine]    Unknown reaction   Diphenhydramine Hcl Swelling   Throat feels like it swells    Ivp Dye [iodinated Diagnostic Agents]    Irregular heart beat - reaction to IV for CT - not oral    Lactose Intolerance (gi) Diarrhea, Other (See Comments)   bloating   Mesalamine Other (See Comments)   Hair loss   Sulfasalazine Diarrhea   Azithromycin Palpitations   Speeding heart rate per pt.   Caffeine Palpitations   Penicillins Rash   Has patient had a PCN reaction causing immediate rash, facial/tongue/throat swelling, SOB or lightheadedness with hypotension: Yes Has patient had a PCN reaction causing severe rash involving mucus membranes or skin necrosis: No Has patient had a PCN reaction that required hospitalization Yes Has patient had a PCN reaction  occurring within the last 10 years: No If all of the above answers are "NO", then may proceed with Cephalosporin use.   Prednisone Palpitations      Medication List    TAKE these medications   albuterol 108 (90 Base) MCG/ACT inhaler Commonly known as:  PROVENTIL HFA;VENTOLIN HFA Inhale 2 puffs into the lungs every 6 (six) hours as needed for wheezing or shortness of breath.   ALPRAZolam 1 MG tablet Commonly known as:  XANAX TAKE 1/2 TO 1 TABLET BY MOUTH 2 TIMES A DAY AS NEEDED FOR ANXIETY What changed:  See the new instructions.   apixaban 5 MG Tabs tablet Commonly known as:  ELIQUIS Take 2 tablets (10 mg total) by mouth 2 (two) times daily.   apixaban 5 MG Tabs tablet Commonly known as:  ELIQUIS Take 1 tablet (5 mg total) by mouth 2 (two) times  daily. Start taking on:  07/11/2016   atenolol 50 MG tablet Commonly known as:  TENORMIN Take 0.5 tablets (25 mg total) by mouth 2 (two) times daily.   GAS-X PO Take 1 tablet by mouth 4 (four) times daily as needed (For gas.). Do not exceed 6 tablets in a 24 hour period.   ibuprofen 600 MG tablet Commonly known as:  ADVIL,MOTRIN TAKE ONE TABLET BY MOUTH THREE TIMES DAILY WITH FOOD AS NEEDED What changed:  how much to take  how to take this  when to take this  reasons to take this  additional instructions   IMODIUM A-D 2 MG tablet Generic drug:  loperamide Take 2 mg by mouth 4 (four) times daily as needed for diarrhea or loose stools.   PROBIOTIC DAILY PO Take 2 capsules by mouth daily with lunch.   SYSTANE OP Place 1 drop into both eyes daily as needed (dry eyes).   vitamin B-12 500 MCG tablet Commonly known as:  CYANOCOBALAMIN Take 500 mcg by mouth daily with lunch. What changed:  Another medication with the same name was removed. Continue taking this medication, and follow the directions you see here.   Vitamin D3 2000 units capsule Take 4,000 Units by mouth daily with lunch.      Allergies  Allergen Reactions  . Doxycycline Palpitations  . Hydrocodone Other (See Comments)    Hallucinations   . Sudafed [Pseudoephedrine Hcl] Shortness Of Breath  . Acular [Ketorolac Tromethamine]     Unknown reaction   . Diphenhydramine Hcl Swelling    Throat feels like it swells   . Ivp Dye [Iodinated Diagnostic Agents]     Irregular heart beat - reaction to IV for CT - not oral    . Lactose Intolerance (Gi) Diarrhea and Other (See Comments)    bloating  . Mesalamine Other (See Comments)    Hair loss  . Sulfasalazine Diarrhea  . Azithromycin Palpitations    Speeding heart rate per pt.   . Caffeine Palpitations  . Penicillins Rash    Has patient had a PCN reaction causing immediate rash, facial/tongue/throat swelling, SOB or lightheadedness with hypotension: Yes Has  patient had a PCN reaction causing severe rash involving mucus membranes or skin necrosis: No Has patient had a PCN reaction that required hospitalization Yes Has patient had a PCN reaction occurring within the last 10 years: No If all of the above answers are "NO", then may proceed with Cephalosporin use.  . Prednisone Palpitations   Follow-up Information    Hoyt Koch, MD Follow up in 1 week(s).   Specialty:  Internal  Medicine Why:  hospital discharge follow up, please discuss with pmd about outpatient sleep study. outpatient lung function test (PFT) Contact information: [place] [address] [phone]        Volanda Napoleon, MD Follow up.   Specialty:  Oncology Why:  for PE. Contact information: [place] [address] [phone]            The results of significant diagnostics from this hospitalization (including imaging, microbiology, ancillary and laboratory) are listed below for reference.    Significant Diagnostic Studies: Dg Chest 2 View  Result Date: 07/04/2016 CLINICAL DATA:  Chest pain and shortness of breath.  Palpitations. EXAM: CHEST  2 VIEW COMPARISON:  Radiographs 05/31/2009 FINDINGS: The heart is normal in size. Calcified mediastinal nodes are again seen. Mild bronchial thickening appears improved from prior exam. No confluent airspace disease. No pleural fluid, pneumothorax or pulmonary edema. No acute osseous abnormalities. IMPRESSION: Chronic bronchial thickening, unchanged from exam 1 month prior. No acute abnormality. Electronically Signed   By: Jeb Levering M.D.   On: 07/04/2016 02:36   Ct Angio Chest Pe W And/or Wo Contrast  Result Date: 07/04/2016 CLINICAL DATA:  Acute onset of shortness of breath and palpitations. Elevated D-dimer. Initial encounter. EXAM: CT ANGIOGRAPHY CHEST WITH CONTRAST TECHNIQUE: Multidetector CT imaging of the chest was performed using the standard protocol during  bolus administration of intravenous contrast. Multiplanar CT image reconstructions and MIPs were obtained to evaluate the vascular anatomy. CONTRAST:  64 mL of Isovue 370 IV contrast COMPARISON:  Chest radiograph performed earlier today at 2:16 a.m. FINDINGS: Cardiovascular: There is pulmonary embolus to the right lower lobe. The RV/ LV ratio of approximately 0.85 remains below the cutoff for right heart strain. The heart is borderline normal in size. Scattered coronary artery calcifications are seen. The great vessels are unremarkable in appearance. Mediastinum/Nodes: Scattered calcified mediastinal nodes reflect remote granulomatous disease. No pericardial effusion is identified. No mediastinal lymphadenopathy is seen. The visualized portions of the thyroid gland are unremarkable. No axillary lymphadenopathy is appreciated. Lungs/Pleura: Minimal scarring is noted at the left lung base. The lungs are otherwise clear. No focal consolidation, pleural effusion or pneumothorax is seen. No masses are identified. Upper Abdomen: The visualized portions of the liver and spleen are unremarkable. The patient is status post cholecystectomy, with clips noted at the gallbladder fossa. The visualized portions of the pancreas and gallbladder are unremarkable. Bilateral adrenal adenomas are noted. Musculoskeletal: No acute osseous abnormalities are identified. The visualized musculature is unremarkable in appearance. Review of the MIP images confirms the above findings. IMPRESSION: 1. Pulmonary embolus to the right lower lung lobe. No CT evidence for right heart strain. 2. Scattered coronary artery calcifications seen. 3. Calcified mediastinal nodes reflect remote granulomatous disease. 4. Incidental note of bilateral adrenal adenomas. Critical Value/emergent results were called by telephone at the time of interpretation on 07/04/2016 at 5:57 am to Dr. Thayer Jew, who verbally acknowledged these results. Electronically Signed    By: Garald Balding M.D.   On: 07/04/2016 05:57    Microbiology: No results found for this or any previous visit (from the past 240 hour(s)).   Labs: Basic Metabolic Panel:  Recent Labs Lab 07/04/16 0200 07/05/16 0500  NA 139 142  K 3.5 3.8  CL 103 106  CO2 26 27  GLUCOSE 136* 94  BUN 6 6  CREATININE 0.93 0.86  CALCIUM 9.3 9.2   Liver Function Tests: No results for input(s): AST, ALT, ALKPHOS,  BILITOT, PROT, ALBUMIN in the last 168 hours. No results for input(s): LIPASE, AMYLASE in the last 168 hours. No results for input(s): AMMONIA in the last 168 hours. CBC:  Recent Labs Lab 07/04/16 0200 07/05/16 0500  WBC 8.4 8.4  HGB 13.4 12.4  HCT 41.5 39.4  MCV 93.9 94.7  PLT 290 284   Cardiac Enzymes:  Recent Labs Lab 07/04/16 1058  TROPONINI <0.03   BNP: BNP (last 3 results)  Recent Labs  07/04/16 0200  BNP 54.6    ProBNP (last 3 results)  Recent Labs  04/14/16 0816  PROBNP 51.0    CBG: No results for input(s): GLUCAP in the last 168 hours.     SignedFlorencia Reasons MD, PhD  [facility] 07/05/2016, 12:35 PM

## 2016-07-05 NOTE — Progress Notes (Signed)
All d/c instructions explained and given to pt and husband.  At 1442.  Verbalized  Understanding.  D/c off floor to awaiting transport.  Karie Kirks,, RN.

## 2016-07-05 NOTE — Consult Note (Signed)
Referral MD  Reason for Referral: Right lower lobe pulmonary embolism-patient with history of ulcerative colitis; patient also taking postmenopausal estrogens   Chief Complaint  Patient presents with  . Palpitations  . Shortness of Breath  : I have a blood clot in my lung  HPI: Cassie [name] is a very nice 56 year old white female. She is a history of ulcerative colitis that is under fairly good control. She also has history of adrenal nodules that is being followed by endocrinology.  She began to have some progressive shortness of breath starting on Sunday. This happened after church. She had no cough. She had no actual chest wall pain although there was some slight pressure. She had no fever. She did not note any leg swelling.  She's had no recent travel.  Of note, she is on estrogens for postmenopausal issues.  She came to the emergency room. She had a CT angiogram done. This did show a blood clot in the right lung.  She was admitted. She has been started on ELIQUIS.  Laboratory was done on admission was really unremarkable. Her blood count was 290. Her white cell count 8.4. She had normal renal function.  Of note, her D- dimer when she came in was 1.31.  She had an echocardiogram done. She had an ejection fraction of 55-60%. There is no ventricular strain.  She had Dopplers of her leg. They were negative.  She does not smoke. She really does not drink.  She had a colonoscopy within the past couple years and all this was fine. She is being followed very closely for the ulcerative colitis.  There is no family history of thromboembolic disease. There is I think a second or third degree relative with factor V Leiden.  She has not lost or gained weight. She has had no rashes. She's had no swollen lymph nodes. Has been no obvious change in bowel or bladder habits.  Overall, her performance status is ECOG 1.    Past Medical History:  Diagnosis Date  . Adrenal nodule ([place])   .  Allergy   . Anemia    20 yrs. ago not now- pt denies   . Anxiety   . Arthritis    knees,thumbs  . Cardiac arrhythmia    palpitations,PVC'sAC  . Cataract    bilateral removed   . Cyst of kidney, acquired    cyst right kidney- benign  . Gallstones   . Morbid obesity ([place])   . Osteoporosis    knees  . Other allergy, other than to medicinal agents   . Panic disorder   . Ulcerative colitis, unspecified   . Vitamin B12 deficiency   . Vitamin D deficiency   :  Past Surgical History:  Procedure Laterality Date  . BREAST BIOPSY  11/2011   Left- benign  . CATARACT EXTRACTION Right   . CATARACT EXTRACTION Left   . CHOLECYSTECTOMY N/A 01/17/2014   Procedure: LAPAROSCOPIC CHOLECYSTECTOMY;  Surgeon: Excell Seltzer, MD;  Location: [facility];  Service: General;  Laterality: N/A;  . COLONOSCOPY    . RETINAL LASER PROCEDURE Left   . SIGMOIDOSCOPY    :   Current Facility-Administered Medications:  .  0.9 %  sodium chloride infusion, 250 mL, Intravenous, PRN, Waldemar Dickens, MD .  acetaminophen (TYLENOL) tablet 650 mg, 650 mg, Oral, Q6H PRN **OR** acetaminophen (TYLENOL) suppository 650 mg, 650 mg, Rectal, Q6H PRN, Waldemar Dickens, MD .  acidophilus (RISAQUAD) capsule, , Oral, Q lunch, Waldemar Dickens, MD .  ALPRAZolam Duanne Moron) tablet 0.5-1 mg, 0.5-1 mg, Oral, BID PRN, Waldemar Dickens, MD, 0.5 mg at 07/05/16 1231 .  apixaban (ELIQUIS) tablet 10 mg, 10 mg, Oral, BID, 10 mg at 07/05/16 1223 **FOLLOWED BY** [START ON 07/11/2016] apixaban (ELIQUIS) tablet 5 mg, 5 mg, Oral, BID, Rebecka Apley, RPH .  atenolol (TENORMIN) tablet 25 mg, 25 mg, Oral, BID, Florencia Reasons, MD, 25 mg at 07/05/16 1241 .  loperamide (IMODIUM) capsule 2 mg, 2 mg, Oral, QID PRN, Waldemar Dickens, MD .  ondansetron [facility]) tablet 4 mg, 4 mg, Oral, Q6H PRN **OR** ondansetron (ZOFRAN) injection 4 mg, 4 mg, Intravenous, Q6H PRN, Waldemar Dickens, MD .  simethicone [facility]) chewable tablet 80 mg, 80 mg, Oral, QID PRN, Waldemar Dickens,  MD .  sodium chloride flush (NS) 0.9 % injection 3 mL, 3 mL, Intravenous, Q12H, Waldemar Dickens, MD, 3 mL at 07/05/16 1252 .  sodium chloride flush (NS) 0.9 % injection 3 mL, 3 mL, Intravenous, PRN, Waldemar Dickens, MD:  . acidophilus   Oral Q lunch  . apixaban  10 mg Oral BID   Followed by  . [START ON 07/11/2016] apixaban  5 mg Oral BID  . atenolol  25 mg Oral BID  . sodium chloride flush  3 mL Intravenous Q12H  :  Allergies  Allergen Reactions  . Doxycycline Palpitations  . Hydrocodone Other (See Comments)    Hallucinations   . Sudafed [Pseudoephedrine Hcl] Shortness Of Breath  . Acular [Ketorolac Tromethamine]     Unknown reaction   . Diphenhydramine Hcl Swelling    Throat feels like it swells   . Ivp Dye [Iodinated Diagnostic Agents]     Irregular heart beat - reaction to IV for CT - not oral    . Lactose Intolerance (Gi) Diarrhea and Other (See Comments)    bloating  . Mesalamine Other (See Comments)    Hair loss  . Sulfasalazine Diarrhea  . Azithromycin Palpitations    Speeding heart rate per pt.   . Caffeine Palpitations  . Penicillins Rash    Has patient had a PCN reaction causing immediate rash, facial/tongue/throat swelling, SOB or lightheadedness with hypotension: Yes Has patient had a PCN reaction causing severe rash involving mucus membranes or skin necrosis: No Has patient had a PCN reaction that required hospitalization Yes Has patient had a PCN reaction occurring within the last 10 years: No If all of the above answers are "NO", then may proceed with Cephalosporin use.  . Prednisone Palpitations  :  Family History  Problem Relation Age of Onset  . Heart disease Father 63    pacemaker  . Atrial fibrillation Father   . Colon cancer Neg Hx   . Stomach cancer Neg Hx   . Colon polyps Neg Hx   . Esophageal cancer Neg Hx   . Rectal cancer Neg Hx   :  Social History   Social History  . Marital status: Married    Spouse name: N/A  . Number of  children: 0  . Years of education: N/A   Occupational History  . Housewife Unemployed   Social History Main Topics  . Smoking status: Former Smoker    Types: Cigarettes    Quit date: 03/07/1979  . Smokeless tobacco: Never Used  . Alcohol use No  . Drug use: No  . Sexual activity: Yes    Partners: Male   Other Topics Concern  . Not on file   Social History Narrative  Married: 0 kids   Regular exercise: walks 2 times a week   Caffeine use: none  :  Pertinent items are noted in HPI.  Exam: Patient Vitals for the past 24 hrs:  BP Temp Temp src Pulse Resp SpO2 Weight  07/05/16 1227 112/68 - - 64 - - -  07/05/16 0523 (!) 105/48 98.3 F (36.8 C) Oral (!) 53 18 98 % (!) 302 lb 14.4 oz (137.4 kg)  07/05/16 0046 (!) 108/53 98 F (36.7 C) Oral 63 - 97 % -  07/04/16 2112 99/81 98 F (36.7 C) Oral (!) 58 18 98 % -  07/04/16 2000 (!) 133/55 98.6 F (37 C) Oral 60 20 94 % -   As above    Recent Labs  07/04/16 0200 07/05/16 0500  WBC 8.4 8.4  HGB 13.4 12.4  HCT 41.5 39.4  PLT 290 284    Recent Labs  07/04/16 0200 07/05/16 0500  NA 139 142  K 3.5 3.8  CL 103 106  CO2 26 27  GLUCOSE 136* 94  BUN 6 6  CREATININE 0.93 0.86  CALCIUM 9.3 9.2    Blood smear review: None  Pathology: None     Assessment and Plan:  Cassie [name] is a very charming 56 year old white female. She has a pulmonary embolism. This certainly could be caused by the postmenopausal estrogens. She cannot be on these from my point of view.  I think that she having the ulcerative colitis could also be a factor. I think this does increase her risk of thromboembolic disease.  The fact that she did have a thromboembolism while on estrogens could indicate that she may have factor V Leiden or other thrombophilic conditions. We will send off the hypercoagulable panel and see what that shows.  By her blood counts, I don't see that she really has any issues with myeloproliferative conditions. I'll see  anything that would suggest polycythemia.  I think that she is going to need relatively long-term anticoagulation. I think that I a therapeutic year of ELIQUIS would be reasonable and then a year, at least, of maintenance therapy would be suggested.  I saw her with her husband. They're both very nice. She looks quite stable. Her echocardiogram looked good.  I will plan to see her as an outpatient. I would repeat a CT angiogram in 3 months. I would have to believe that the thrombus will have resolved.  I spent about 45 minutes with she and her husband. They are both very very nice. It was nice talking with them.  [name], MD  Proverbs 3:5-6

## 2016-07-05 NOTE — Progress Notes (Signed)
Pt requesting to ask XU to cll blood thinner in to pharmacy before she d/c.  Notified MD.  Karie Kirks, RN.

## 2016-07-05 NOTE — Progress Notes (Signed)
Order received for atenolol however plan to start in morning due to low blood pressure.Patient home medication atenolol sent to pharmacy for distribution.

## 2016-07-05 NOTE — Progress Notes (Signed)
Patient asking when she is going to take next dose of atenolol.Explained to patient that she currently does not have order to take this medication.Patient stated," Day shift nurse allowed me to take atenolol from home.I can't use hospital brand name ." Explained to patient that I could not allow her to take that medication without order from doctor and I would call to try to get order.The doctor may not want her to take medication right now due to complaints of being lightheaded and heart rate ranging 55-65 and blood pressure running low.Patient verbalized understanding.

## 2016-07-06 ENCOUNTER — Other Ambulatory Visit: Payer: Self-pay | Admitting: Hematology & Oncology

## 2016-07-06 ENCOUNTER — Encounter: Payer: Self-pay | Admitting: Internal Medicine

## 2016-07-06 DIAGNOSIS — I2699 Other pulmonary embolism without acute cor pulmonale: Secondary | ICD-10-CM

## 2016-07-06 LAB — LUPUS ANTICOAGULANT PANEL
DRVVT: 68.3 s — ABNORMAL HIGH (ref 0.0–47.0)
PTT LA: 32.5 s (ref 0.0–51.9)

## 2016-07-06 LAB — DRVVT CONFIRM: DRVVT CONFIRM: 1 ratio (ref 0.8–1.2)

## 2016-07-06 LAB — DRVVT MIX: dRVVT Mix: 50.6 s — ABNORMAL HIGH (ref 0.0–47.0)

## 2016-07-06 LAB — PROTEIN C, TOTAL
PROTEIN C, TOTAL: 85 % (ref 60–150)
Protein C, Total: 85 % (ref 60–150)

## 2016-07-06 LAB — BETA-2-GLYCOPROTEIN I ABS, IGG/M/A

## 2016-07-07 LAB — CARDIOLIPIN ANTIBODIES, IGG, IGM, IGA: Anticardiolipin IgM: <9 MPL U/mL (ref 0–12)

## 2016-07-08 ENCOUNTER — Ambulatory Visit: Payer: 59

## 2016-07-08 ENCOUNTER — Telehealth: Payer: Self-pay | Admitting: Pharmacist

## 2016-07-08 ENCOUNTER — Other Ambulatory Visit (HOSPITAL_BASED_OUTPATIENT_CLINIC_OR_DEPARTMENT_OTHER): Payer: 59

## 2016-07-08 ENCOUNTER — Ambulatory Visit (HOSPITAL_BASED_OUTPATIENT_CLINIC_OR_DEPARTMENT_OTHER): Payer: 59 | Admitting: Hematology & Oncology

## 2016-07-08 VITALS — BP 128/55 | HR 62 | Temp 97.8°F | Resp 18 | Ht 66.0 in | Wt 304.0 lb

## 2016-07-08 DIAGNOSIS — E538 Deficiency of other specified B group vitamins: Secondary | ICD-10-CM

## 2016-07-08 DIAGNOSIS — I2699 Other pulmonary embolism without acute cor pulmonale: Secondary | ICD-10-CM | POA: Diagnosis not present

## 2016-07-08 DIAGNOSIS — K5190 Ulcerative colitis, unspecified, without complications: Secondary | ICD-10-CM

## 2016-07-08 DIAGNOSIS — I749 Embolism and thrombosis of unspecified artery: Secondary | ICD-10-CM

## 2016-07-08 LAB — FACTOR 5 LEIDEN

## 2016-07-08 LAB — CBC WITH DIFFERENTIAL (CANCER CENTER ONLY)
BASO#: 0 10*3/uL (ref 0.0–0.2)
BASO%: 0.1 % (ref 0.0–2.0)
EOS ABS: 0.3 10*3/uL (ref 0.0–0.5)
EOS%: 3.8 % (ref 0.0–7.0)
HEMATOCRIT: 42.8 % (ref 34.8–46.6)
HEMOGLOBIN: 13.9 g/dL (ref 11.6–15.9)
LYMPH#: 1.8 10*3/uL (ref 0.9–3.3)
LYMPH%: 23.5 % (ref 14.0–48.0)
MCH: 30.8 pg (ref 26.0–34.0)
MCHC: 32.5 g/dL (ref 32.0–36.0)
MCV: 95 fL (ref 81–101)
MONO#: 0.6 10*3/uL (ref 0.1–0.9)
MONO%: 7.4 % (ref 0.0–13.0)
NEUT%: 65.2 % (ref 39.6–80.0)
NEUTROS ABS: 5 10*3/uL (ref 1.5–6.5)
Platelets: 272 10*3/uL (ref 145–400)
RBC: 4.51 10*6/uL (ref 3.70–5.32)
RDW: 13.3 % (ref 11.1–15.7)
WBC: 7.7 10*3/uL (ref 3.9–10.0)

## 2016-07-08 LAB — CMP (CANCER CENTER ONLY)
ALBUMIN: 3.2 g/dL — AB (ref 3.3–5.5)
ALT(SGPT): 33 U/L (ref 10–47)
AST: 28 U/L (ref 11–38)
Alkaline Phosphatase: 78 U/L (ref 26–84)
BILIRUBIN TOTAL: 0.5 mg/dL (ref 0.20–1.60)
BUN, Bld: 7 mg/dL (ref 7–22)
CO2: 27 meq/L (ref 18–33)
Calcium: 9.3 mg/dL (ref 8.0–10.3)
Chloride: 105 mEq/L (ref 98–108)
Creat: 0.9 mg/dl (ref 0.6–1.2)
Glucose, Bld: 111 mg/dL (ref 73–118)
Potassium: 3.6 mEq/L (ref 3.3–4.7)
Sodium: 141 mEq/L (ref 128–145)
Total Protein: 7.4 g/dL (ref 6.4–8.1)

## 2016-07-08 LAB — PROTHROMBIN GENE MUTATION

## 2016-07-08 MED ORDER — FOLIC ACID 1 MG PO TABS: 1.0000 mg | ORAL_TABLET | Freq: Every day | ORAL | 6 refills | Status: DC

## 2016-07-08 NOTE — Telephone Encounter (Signed)
Samples given: Eliquis 5 mg  1 box = 14 tablets Lot:  [id number] Exp:  6/19

## 2016-07-09 ENCOUNTER — Encounter: Payer: Self-pay | Admitting: Hematology & Oncology

## 2016-07-09 LAB — D-DIMER, QUANTITATIVE: D-DIMER: 0.56 mg/L FEU — ABNORMAL HIGH (ref 0.00–0.49)

## 2016-07-11 ENCOUNTER — Telehealth: Payer: Self-pay | Admitting: *Deleted

## 2016-07-11 ENCOUNTER — Other Ambulatory Visit: Payer: Self-pay | Admitting: *Deleted

## 2016-07-11 NOTE — Telephone Encounter (Addendum)
Patient is aware of results  ----- Message from Volanda Napoleon, MD sent at 07/09/2016  7:42 AM EST ----- Call - the factor V Leiden is normal and the Prothrombin II gene is normal.  Therefore all of the blood clotting tests are negative.  pete

## 2016-07-12 ENCOUNTER — Encounter: Payer: Self-pay | Admitting: Internal Medicine

## 2016-07-12 ENCOUNTER — Ambulatory Visit (INDEPENDENT_AMBULATORY_CARE_PROVIDER_SITE_OTHER): Payer: 59 | Admitting: Internal Medicine

## 2016-07-12 DIAGNOSIS — R0602 Shortness of breath: Secondary | ICD-10-CM

## 2016-07-12 DIAGNOSIS — I2699 Other pulmonary embolism without acute cor pulmonale: Secondary | ICD-10-CM | POA: Diagnosis not present

## 2016-07-12 DIAGNOSIS — K219 Gastro-esophageal reflux disease without esophagitis: Secondary | ICD-10-CM

## 2016-07-12 MED ORDER — FAMOTIDINE 20 MG PO TABS: 20.0000 mg | ORAL_TABLET | Freq: Two times a day (BID) | ORAL | 11 refills | Status: DC

## 2016-07-12 NOTE — Assessment & Plan Note (Signed)
Taking eliquis and so far no signs of genetic predisposition. We talked about the fact that her weight and the estrogen with inactivity could have done this. She will continue to see oncology/hematology. Hormone replacement has been stopped.

## 2016-07-12 NOTE — Progress Notes (Signed)
   Subjective:    Patient ID: Cassie [name], female    DOB: Sep 24, 1960, 56 y.o.   MRN: [id number]  HPI The patient is a 56 YO female coming in for hospital follow up (in for PE, hypercoagulable workup without cause, was on estrogen and sedentary, with colitis chronically and morbid obesity, on eliuis after heparin drip). She has seen oncology (note not available) and they are having her continue on eliquis (plan from the hospital initial year of therapy and possibly another year of additional therapy). She has stopped her estrogen and has not had any menopause symptoms. She had been on this for many years. She has several questions about her CT scan of the chest which we are able to discuss in detail. She denies problems with dark stools or bleeding. She is having some new GERD symptoms since starting eliquis. She has taken tums intermittently but the burning lasts most of the day so she is worried about taking too many so she just suffers with it sometimes.   PMH, [facility], social history reviewed and updated.   Review of Systems  Constitutional: Positive for activity change and fatigue. Negative for appetite change, chills, fever and unexpected weight change.  HENT: Negative.   Eyes: Negative.   Respiratory: Positive for chest tightness and shortness of breath. Negative for cough and wheezing.   Cardiovascular: Positive for chest pain. Negative for palpitations and leg swelling.       Mild at the rib cage from coughing  Gastrointestinal: Positive for abdominal pain. Negative for abdominal distention, anal bleeding, blood in stool, constipation, diarrhea, nausea, rectal pain and vomiting.  Musculoskeletal: Positive for arthralgias and myalgias. Negative for back pain, gait problem, neck pain and neck stiffness.  Skin: Negative.   Neurological: Negative.   Psychiatric/Behavioral: Positive for decreased concentration. Negative for agitation, behavioral problems, confusion, dysphoric mood,  self-injury, sleep disturbance and suicidal ideas. The patient is nervous/anxious.       Objective:   Physical Exam  Constitutional: She is oriented to person, place, and time. She appears well-developed and well-nourished.  Overweight  HENT:  Head: Normocephalic and atraumatic.  Eyes: EOM are normal.  Neck: Normal range of motion.  Cardiovascular: Normal rate and regular rhythm.   Pulmonary/Chest: Effort normal and breath sounds normal. No respiratory distress. She has no wheezes. She has no rales.  Abdominal: Soft. Bowel sounds are normal. She exhibits no distension. There is no tenderness. There is no rebound.  Mild tenderness in epigastric region  Musculoskeletal: She exhibits no edema.  Neurological: She is alert and oriented to person, place, and time. Coordination normal.  Skin: Skin is warm and dry.  Psychiatric:  Somewhat anxious during the visit.    Vitals:   07/12/16 0922  BP: 130/78  Pulse: 70  Temp: 98.3 F (36.8 C)  TempSrc: Oral  SpO2: 96%  Weight: (!) 304 lb (137.9 kg)  Height: 5' 6"  (1.676 m)      Assessment & Plan:

## 2016-07-12 NOTE — Assessment & Plan Note (Signed)
It is possible that the newly found PE could be the cause of her SOB as it is improving gradually while on eliquis therapy. She will still do the PFTs at the end of the month. Also needs heart cath at some point in the near future.

## 2016-07-12 NOTE — Progress Notes (Signed)
Pre visit review using our clinic review tool, if applicable. No additional management support is needed unless otherwise documented below in the visit note. 

## 2016-07-12 NOTE — Patient Instructions (Signed)
We have sent in the pepcid for the stomach which you can take in the morning. It is okay to take twice a day if needed.   If you take it once a day you can use tums in between.

## 2016-07-12 NOTE — Assessment & Plan Note (Signed)
Pepcid for the heartburn symptoms and she is concerned about possible side effects so we discussed those in depth today including the risks and benefits of the alternative therapies for GERD.

## 2016-07-13 ENCOUNTER — Telehealth: Payer: Self-pay | Admitting: *Deleted

## 2016-07-13 NOTE — Progress Notes (Signed)
Hematology and Oncology Follow Up Visit  Cassie [name] [id number] 1961-04-14 56 y.o. 07/13/2016   Principle Diagnosis:   Pulmonary embolism of the right lower lobe-idiopathic  Ulcerative colitis  Current Therapy:    ELIQUIS 5 mg by mouth twice a day     Interim History:  Cassie [name] is in for her first office visit. I saw her in consultation at [facility] on February 27. She was admitted because of shortness of breath and chest wall pain. She was found to have a right lower lobe pulmonary embolus.  She was are on heparin and then transitioned over to [facility].  We did go ahead and do a hypercoagulable panel on her. Of note, she was on postmenopausal estrogens. She had been on these for quite a while.  She does have ulcerative colitis. This might be considered a risk factor for thromboembolic disease. Her colitis has been under good control.  The hypercoagulable panel did not show any thrombophilic state that would've led to this blood clot.  She is feeling better. She does not have as much chest wall discomfort. She still has a little bit of discomfort in the anterior chest.  There is no hemoptysis. There is no nausea or vomiting. She's had no change in bowel or bladder habits. She's had no leg swelling.  In the hospital, she had Dopplers of her leg and these were normal.  There's been no fever. She's had no rashes.  Overall, her performance status is ECOG 1. Medications:  Current Outpatient Prescriptions:  .  ALPRAZolam (XANAX) 1 MG tablet, TAKE 1/2 TO 1 TABLET BY MOUTH 2 TIMES A DAY AS NEEDED FOR ANXIETY (Patient taking differently: TAKE 1/2 TABLET BY MOUTH EVERY MORNING AND 1 TABLET AT BEDTIME), Disp: 60 tablet, Rfl: 1 .  apixaban (ELIQUIS) 5 MG TABS tablet, Take 2 tablets (10 mg total) by mouth 2 (two) times daily., Disp: 24 tablet, Rfl: 0 .  atenolol (TENORMIN) 50 MG tablet, Take 0.5 tablets (25 mg total) by mouth 2 (two) times daily., Disp: 90 tablet, Rfl: 3 .   Cholecalciferol (VITAMIN D3) 2000 units capsule, Take 4,000 Units by mouth daily with lunch., Disp: , Rfl:  .  ibuprofen (ADVIL,MOTRIN) 600 MG tablet, TAKE ONE TABLET BY MOUTH THREE TIMES DAILY WITH FOOD AS NEEDED (Patient taking differently: Take 600 mg by mouth 3 (three) times daily as needed (pain). ), Disp: 42 tablet, Rfl: 1 .  loperamide (IMODIUM A-D) 2 MG tablet, Take 2 mg by mouth 4 (four) times daily as needed for diarrhea or loose stools., Disp: , Rfl:  .  Polyethyl Glycol-Propyl Glycol (SYSTANE OP), Place 1 drop into both eyes daily as needed (dry eyes)., Disp: , Rfl:  .  Probiotic Product (PROBIOTIC DAILY PO), Take 2 capsules by mouth daily with lunch. , Disp: , Rfl:  .  Simethicone (GAS-X PO), Take 1 tablet by mouth 4 (four) times daily as needed (For gas.). Do not exceed 6 tablets in a 24 hour period., Disp: , Rfl:  .  vitamin B-12 (CYANOCOBALAMIN) 500 MCG tablet, Take 500 mcg by mouth daily with lunch., Disp: , Rfl:  .  albuterol (PROVENTIL HFA;VENTOLIN HFA) 108 (90 Base) MCG/ACT inhaler, Inhale 2 puffs into the lungs every 6 (six) hours as needed for wheezing or shortness of breath. (Patient not taking: Reported on 07/12/2016), Disp: 1 Inhaler, Rfl: 0 .  apixaban (ELIQUIS) 5 MG TABS tablet, Take 1 tablet (5 mg total) by mouth 2 (two) times daily., Disp: 60 tablet,  Rfl: 0 .  famotidine (PEPCID) 20 MG tablet, Take 1 tablet (20 mg total) by mouth 2 (two) times daily., Disp: 60 tablet, Rfl: 11 .  folic acid (FOLVITE) 1 MG tablet, Take 1 tablet (1 mg total) by mouth daily., Disp: 90 tablet, Rfl: 6  Allergies:  Allergies  Allergen Reactions  . Doxycycline Palpitations  . Hydrocodone Other (See Comments)    Hallucinations   . Sudafed [Pseudoephedrine Hcl] Shortness Of Breath  . Acular [Ketorolac Tromethamine]     Unknown reaction   . Diphenhydramine Hcl Swelling    Throat feels like it swells   . Ivp Dye [Iodinated Diagnostic Agents]     Irregular heart beat - reaction to IV for CT -  not oral    . Lactose Intolerance (Gi) Diarrhea and Other (See Comments)    bloating  . Mesalamine Other (See Comments)    Hair loss  . Sulfasalazine Diarrhea  . Azithromycin Palpitations    Speeding heart rate per pt.   . Caffeine Palpitations  . Penicillins Rash    Has patient had a PCN reaction causing immediate rash, facial/tongue/throat swelling, SOB or lightheadedness with hypotension: Yes Has patient had a PCN reaction causing severe rash involving mucus membranes or skin necrosis: No Has patient had a PCN reaction that required hospitalization Yes Has patient had a PCN reaction occurring within the last 10 years: No If all of the above answers are "NO", then may proceed with Cephalosporin use.  . Prednisone Palpitations    Past Medical History, Surgical history, Social history, and Family History were reviewed and updated.  Review of Systems: As above  Physical Exam:  height is 5' 6"  (1.676 m) and weight is 304 lb (137.9 kg) (abnormal). Her oral temperature is 97.8 F (36.6 C). Her blood pressure is 128/55 (abnormal) and her pulse is 62. Her respiration is 18 and oxygen saturation is 100%.   Wt Readings from Last 3 Encounters:  07/12/16 (!) 304 lb (137.9 kg)  07/08/16 (!) 304 lb (137.9 kg)  07/05/16 (!) 302 lb 14.4 oz (137.4 kg)     Obese white female in no obvious distress. Head and neck exam shows no ocular or oral lesions. She has no palpable cervical or supraclavicular lymph nodes. Lungs are clear bilaterally. Cardiac exam regular rate and rhythm with no murmurs, rubs or bruits. Abdomen is soft. She has good bowel sounds. She is obese. She has no fluid wave. There is no palpable liver or spleen tip. Back exam shows no tenderness over the spine, ribs or hips. Extremities shows no clubbing, cyanosis or edema. She has good range of motion of her joints. No venous cord is noted in her legs. She is a negative Homans sign. Skin exam shows no rashes, ecchymoses or petechia.  Neurological exam shows no focal neurological deficits.  Lab Results  Component Value Date   WBC 7.7 07/08/2016   HGB 13.9 07/08/2016   HCT 42.8 07/08/2016   MCV 95 07/08/2016   PLT 272 07/08/2016     Chemistry      Component Value Date/Time   NA 141 07/08/2016 1401   K 3.6 07/08/2016 1401   CL 105 07/08/2016 1401   CO2 27 07/08/2016 1401   BUN 7 07/08/2016 1401   CREATININE 0.9 07/08/2016 1401      Component Value Date/Time   CALCIUM 9.3 07/08/2016 1401   ALKPHOS 78 07/08/2016 1401   AST 28 07/08/2016 1401   ALT 33 07/08/2016 1401   BILITOT  0.50 07/08/2016 1401         Impression and Plan: Cassie [name] is a 56 year old white female. She has thromboembolic disease with a pulmonary embolus. She was on post menopausal estrogens. She does have ulcerative colitis. These could be the risk factors that could've caused her to have this blood clot.  She does not have any thrombophilic condition.  I don't see that she has any type of myeloproliferative condition that would need to be evaluated. She does not need a bone marrow test.  We will keep her on ELIQUIS. I think she needs therapeutic ELIQUIS for 1 year.  I would also put her on folic acid. I did this might help a little bit.  I will add to get another CT angiogram on her in about 2 or 3 months. I did this will help [place] with respect to management as to how long she will need therapeutic anticoagulation.  I will plan to see her back in 2 months. We will get the CT angiogram the same day that we see her.  I spent about 45 minutes with she and her husband. I answered all their questions.   Volanda Napoleon, MD 3/7/201812:28 PM

## 2016-07-13 NOTE — Telephone Encounter (Signed)
Patient c/o symptoms that she believes are related to her Eliquis.   -continued acid reflux - started on Pepcid yesterday -loose stools with some mucous - pt has hx of UC -arthritis flare to bilateral knees -back pain to the right of her spine. She states she can feel a dent there that wasn't there before. Her PCP looked at it yesterday and felt the pain may be related to the blood clot.  Patient has been in [employer] and states these are all confirmed side effects that others have experienced.   Reviewed with Dr Marin Olp. He wants patient to follow up with her GI physician regarding her GI symptoms. He's not convinced the joint and back pain are related, however he is willing to switch patient to Xarelto if she believes she isn't tolerating the Eliquis.  Reviewed with patient and she doesn't want to switch because she has researched Xarelto and it has similar side effects. She wants to continue with Eliquis and see if symptoms resolve in the coming days. Instructed patient to use Tylenol for her pain. She will call us back in a few days if symptoms continue or worsen.

## 2016-07-15 ENCOUNTER — Ambulatory Visit (HOSPITAL_BASED_OUTPATIENT_CLINIC_OR_DEPARTMENT_OTHER): Payer: 59

## 2016-07-15 ENCOUNTER — Ambulatory Visit (HOSPITAL_BASED_OUTPATIENT_CLINIC_OR_DEPARTMENT_OTHER): Payer: 59 | Admitting: Hematology & Oncology

## 2016-07-15 ENCOUNTER — Ambulatory Visit (HOSPITAL_BASED_OUTPATIENT_CLINIC_OR_DEPARTMENT_OTHER)
Admission: RE | Admit: 2016-07-15 | Discharge: 2016-07-15 | Disposition: A | Discharge status: Home / Self Care | Payer: 59 | Source: Ambulatory Visit | Attending: Hematology & Oncology | Admitting: Hematology & Oncology

## 2016-07-15 ENCOUNTER — Other Ambulatory Visit: Payer: Self-pay | Admitting: Hematology & Oncology

## 2016-07-15 ENCOUNTER — Telehealth: Payer: Self-pay | Admitting: *Deleted

## 2016-07-15 VITALS — BP 117/63 | HR 66 | Temp 98.2°F | Wt 303.0 lb

## 2016-07-15 DIAGNOSIS — M79606 Pain in leg, unspecified: Secondary | ICD-10-CM

## 2016-07-15 DIAGNOSIS — K5190 Ulcerative colitis, unspecified, without complications: Secondary | ICD-10-CM

## 2016-07-15 DIAGNOSIS — F419 Anxiety disorder, unspecified: Secondary | ICD-10-CM | POA: Diagnosis not present

## 2016-07-15 DIAGNOSIS — R0602 Shortness of breath: Secondary | ICD-10-CM | POA: Diagnosis not present

## 2016-07-15 DIAGNOSIS — Z86711 Personal history of pulmonary embolism: Secondary | ICD-10-CM | POA: Insufficient documentation

## 2016-07-15 DIAGNOSIS — R0600 Dyspnea, unspecified: Secondary | ICD-10-CM | POA: Diagnosis not present

## 2016-07-15 DIAGNOSIS — I2699 Other pulmonary embolism without acute cor pulmonale: Secondary | ICD-10-CM | POA: Diagnosis not present

## 2016-07-15 DIAGNOSIS — R05 Cough: Secondary | ICD-10-CM

## 2016-07-15 DIAGNOSIS — F418 Other specified anxiety disorders: Secondary | ICD-10-CM

## 2016-07-15 DIAGNOSIS — M79604 Pain in right leg: Secondary | ICD-10-CM

## 2016-07-15 DIAGNOSIS — M79605 Pain in left leg: Secondary | ICD-10-CM | POA: Insufficient documentation

## 2016-07-15 DIAGNOSIS — M545 Low back pain: Secondary | ICD-10-CM | POA: Diagnosis not present

## 2016-07-15 DIAGNOSIS — R059 Cough, unspecified: Secondary | ICD-10-CM

## 2016-07-15 LAB — CBC WITH DIFFERENTIAL (CANCER CENTER ONLY)
BASO#: 0 10*3/uL (ref 0.0–0.2)
BASO%: 0.2 % (ref 0.0–2.0)
EOS%: 4.2 % (ref 0.0–7.0)
Eosinophils Absolute: 0.3 10*3/uL (ref 0.0–0.5)
HCT: 42.1 % (ref 34.8–46.6)
HEMOGLOBIN: 13.5 g/dL (ref 11.6–15.9)
LYMPH#: 1.3 10*3/uL (ref 0.9–3.3)
LYMPH%: 20.4 % (ref 14.0–48.0)
MCH: 30.8 pg (ref 26.0–34.0)
MCHC: 32.1 g/dL (ref 32.0–36.0)
MCV: 96 fL (ref 81–101)
MONO#: 0.4 10*3/uL (ref 0.1–0.9)
MONO%: 6.8 % (ref 0.0–13.0)
NEUT%: 68.4 % (ref 39.6–80.0)
NEUTROS ABS: 4.4 10*3/uL (ref 1.5–6.5)
PLATELETS: 282 10*3/uL (ref 145–400)
RBC: 4.39 10*6/uL (ref 3.70–5.32)
RDW: 13.4 % (ref 11.1–15.7)
WBC: 6.4 10*3/uL (ref 3.9–10.0)

## 2016-07-15 LAB — CMP (CANCER CENTER ONLY)
ALBUMIN: 3.3 g/dL (ref 3.3–5.5)
ALT(SGPT): 25 U/L (ref 10–47)
AST: 23 U/L (ref 11–38)
Alkaline Phosphatase: 80 U/L (ref 26–84)
BUN, Bld: 6 mg/dL — ABNORMAL LOW (ref 7–22)
CO2: 28 mEq/L (ref 18–33)
CREATININE: 0.9 mg/dL (ref 0.6–1.2)
Calcium: 9.7 mg/dL (ref 8.0–10.3)
Chloride: 102 mEq/L (ref 98–108)
Glucose, Bld: 135 mg/dL — ABNORMAL HIGH (ref 73–118)
POTASSIUM: 3.9 meq/L (ref 3.3–4.7)
SODIUM: 145 meq/L (ref 128–145)
Total Bilirubin: 0.5 mg/dl (ref 0.20–1.60)
Total Protein: 7.3 g/dL (ref 6.4–8.1)

## 2016-07-15 NOTE — Progress Notes (Signed)
Hematology and Oncology Follow Up Visit  Cassie [name] [id number] [birth year] 56 y.o. 07/15/2016   Principle Diagnosis:   Idiopathic right lower lobe pulmonary embolism  Ulcerative colitis  Current Therapy:    ELIQUIS 5 mg by mouth twice a day     Interim History:  Cassie [name] is in for an unscheduled visit. She has kept calling our office with complaints of not feeling well. She has had complaints of intermittent leg pain. She's had back pain. She's had diarrhea. She's had some shortness of breath.  She got on the Internet and thinks all of her symptoms are from the [place].  She's had no bleeding. She's had no fever. She's had no rashes.  I offered to switch the [facility] and have her on Xarelto. She did not wish to make a change and she thinks that she still may have side effects.  I looked up the side effects for ELIQUIS. I really cannot see where it causes the issues that she has had.  She is worried that her blood clot is getting worse. Patient just had the pulmonary embolus diagnosed a couple weeks ago. I told her that I would think that if we did a CT angiogram now, that it was still show [place] a blood clot and I'm unsure what that would indicate.  When we saw her a week ago, her d-dimer was 0.56 which was improved.  There's not been any fever. She is not coughed up any blood.  She does have a lot of anxiety about her health issues.  I do have her on some folic acid which I thought would help a little bit with her ulcerative colitis.  Overall, her performance status is ECOG 1.    Medications:  Current Outpatient Prescriptions:  .  albuterol (PROVENTIL HFA;VENTOLIN HFA) 108 (90 Base) MCG/ACT inhaler, Inhale 2 puffs into the lungs every 6 (six) hours as needed for wheezing or shortness of breath. (Patient not taking: Reported on 07/12/2016), Disp: 1 Inhaler, Rfl: 0 .  ALPRAZolam (XANAX) 1 MG tablet, TAKE 1/2 TO 1 TABLET BY MOUTH 2 TIMES A DAY AS NEEDED FOR ANXIETY  (Patient taking differently: TAKE 1/2 TABLET BY MOUTH EVERY MORNING AND 1 TABLET AT BEDTIME), Disp: 60 tablet, Rfl: 1 .  apixaban (ELIQUIS) 5 MG TABS tablet, Take 2 tablets (10 mg total) by mouth 2 (two) times daily., Disp: 24 tablet, Rfl: 0 .  apixaban (ELIQUIS) 5 MG TABS tablet, Take 1 tablet (5 mg total) by mouth 2 (two) times daily., Disp: 60 tablet, Rfl: 0 .  atenolol (TENORMIN) 50 MG tablet, Take 0.5 tablets (25 mg total) by mouth 2 (two) times daily., Disp: 90 tablet, Rfl: 3 .  Cholecalciferol (VITAMIN D3) 2000 units capsule, Take 4,000 Units by mouth daily with lunch., Disp: , Rfl:  .  famotidine (PEPCID) 20 MG tablet, Take 1 tablet (20 mg total) by mouth 2 (two) times daily., Disp: 60 tablet, Rfl: 11 .  folic acid (FOLVITE) 1 MG tablet, Take 1 tablet (1 mg total) by mouth daily., Disp: 90 tablet, Rfl: 6 .  ibuprofen (ADVIL,MOTRIN) 600 MG tablet, TAKE ONE TABLET BY MOUTH THREE TIMES DAILY WITH FOOD AS NEEDED (Patient taking differently: Take 600 mg by mouth 3 (three) times daily as needed (pain). ), Disp: 42 tablet, Rfl: 1 .  loperamide (IMODIUM A-D) 2 MG tablet, Take 2 mg by mouth 4 (four) times daily as needed for diarrhea or loose stools., Disp: , Rfl:  .  Polyethyl Glycol-Propyl Glycol (  SYSTANE OP), Place 1 drop into both eyes daily as needed (dry eyes)., Disp: , Rfl:  .  Probiotic Product (PROBIOTIC DAILY PO), Take 2 capsules by mouth daily with lunch. , Disp: , Rfl:  .  Simethicone (GAS-X PO), Take 1 tablet by mouth 4 (four) times daily as needed (For gas.). Do not exceed 6 tablets in a 24 hour period., Disp: , Rfl:  .  vitamin B-12 (CYANOCOBALAMIN) 500 MCG tablet, Take 500 mcg by mouth daily with lunch., Disp: , Rfl:   Allergies:  Allergies  Allergen Reactions  . Doxycycline Palpitations  . Hydrocodone Other (See Comments)    Hallucinations   . Sudafed [Pseudoephedrine Hcl] Shortness Of Breath  . Acular [Ketorolac Tromethamine]     Unknown reaction   . Diphenhydramine Hcl  Swelling    Throat feels like it swells   . Ivp Dye [Iodinated Diagnostic Agents]     Irregular heart beat - reaction to IV for CT - not oral    . Lactose Intolerance (Gi) Diarrhea and Other (See Comments)    bloating  . Mesalamine Other (See Comments)    Hair loss  . Sulfasalazine Diarrhea  . Azithromycin Palpitations    Speeding heart rate per pt.   . Caffeine Palpitations  . Penicillins Rash    Has patient had a PCN reaction causing immediate rash, facial/tongue/throat swelling, SOB or lightheadedness with hypotension: Yes Has patient had a PCN reaction causing severe rash involving mucus membranes or skin necrosis: No Has patient had a PCN reaction that required hospitalization Yes Has patient had a PCN reaction occurring within the last 10 years: No If all of the above answers are "NO", then may proceed with Cephalosporin use.  . Prednisone Palpitations    Past Medical History, Surgical history, Social history, and Family History were reviewed and updated.  Review of Systems: As above  Physical Exam:  weight is 303 lb (137.4 kg) (abnormal). Her blood pressure is 117/63 and her pulse is 66. Her oxygen saturation is 98%.   Wt Readings from Last 3 Encounters:  07/15/16 (!) 303 lb (137.4 kg)  07/12/16 (!) 304 lb (137.9 kg)  07/08/16 (!) 304 lb (137.9 kg)     Obese white female in no obvious distress. Head and neck exam shows no ocular or oral lesions. She has no palpable cervical or supraclavicular lymph nodes. Lungs are clear bilaterally. I hear no wheezes. I hear no rhonchi. I cannot detect any friction rub. Cardiac exam regular rate and rhythm with no murmurs, rubs or bruits. Abdomen is obese but soft. She has decent bowel sounds. There is no guarding or rebound tenderness. There is no fluid wave. Extremities shows no clubbing, cyanosis or edema. She has chronic nonpitting edema of her legs. No obvious venous cords noted. She has a negative Homans sign bilaterally. Back exam  shows some minimal focal swelling in the thoracic or lumbar region. No obvious spasms are noted. Neurological exam shows no focal neurological deficits.  Lab Results  Component Value Date   WBC 6.4 07/15/2016   HGB 13.5 07/15/2016   HCT 42.1 07/15/2016   MCV 96 07/15/2016   PLT 282 07/15/2016     Chemistry      Component Value Date/Time   NA 141 07/08/2016 1401   K 3.6 07/08/2016 1401   CL 105 07/08/2016 1401   CO2 27 07/08/2016 1401   BUN 7 07/08/2016 1401   CREATININE 0.9 07/08/2016 1401      Component Value Date/Time  CALCIUM 9.3 07/08/2016 1401   ALKPHOS 78 07/08/2016 1401   AST 28 07/08/2016 1401   ALT 33 07/08/2016 1401   BILITOT 0.50 07/08/2016 1401         Impression and Plan: Cassie [name] is A 56 year old white female. She had a pulmonary embolism in the right lower lobe. Thankfully this was not a large embolism. She had been on estrogen therapy area and she does not have any thrombophilic condition.  I just do not believe that her current symptoms are secondary to [facility]. We have had a lot of people ELIQUIS without any problems.  I will go ahead and get Dopplers of her legs. I'm unsure how much we can really see because of her overall body habitus.  I do want a chest x-ray. I don't think that getting a CT angiogram is necessary right now.  I realize is that she does have health issues. I do realize that she has some anxiety over what is going on. These things she really cannot help.  Hopefully, these symptoms will improve. It almost sounds like she may has have viral syndrome.  I spent about 45 minutes with she and her husband.    Volanda Napoleon, MD 3/9/20181:29 PM

## 2016-07-15 NOTE — Telephone Encounter (Signed)
Patient c/o shortness of breath and feeling dizzy. She states she feels like she did "the other week when I was diagnosed with the clot". She would like to be seen today.  Spoke to Dr Marin Olp. He will see patient today. Patient aware of appointment times.

## 2016-07-16 ENCOUNTER — Encounter: Payer: Self-pay | Admitting: Internal Medicine

## 2016-07-16 LAB — D-DIMER, QUANTITATIVE: D-DIMER: 0.35 mg/L FEU (ref 0.00–0.49)

## 2016-07-18 ENCOUNTER — Encounter: Payer: Self-pay | Admitting: *Deleted

## 2016-07-18 LAB — TSH: TSH: 1.479 m[IU]/L (ref 0.308–3.960)

## 2016-07-21 ENCOUNTER — Encounter: Payer: Self-pay | Admitting: Internal Medicine

## 2016-08-02 ENCOUNTER — Telehealth: Payer: Self-pay | Admitting: *Deleted

## 2016-08-02 ENCOUNTER — Other Ambulatory Visit: Payer: Self-pay | Admitting: Hematology & Oncology

## 2016-08-02 ENCOUNTER — Other Ambulatory Visit (HOSPITAL_BASED_OUTPATIENT_CLINIC_OR_DEPARTMENT_OTHER): Payer: 59

## 2016-08-02 DIAGNOSIS — M25572 Pain in left ankle and joints of left foot: Principal | ICD-10-CM

## 2016-08-02 DIAGNOSIS — M25571 Pain in right ankle and joints of right foot: Secondary | ICD-10-CM

## 2016-08-02 NOTE — Telephone Encounter (Signed)
Patient is c/o pain at her left ankle. She states its at the bone. It hurts more when walking or if she touches it. She has a history of clots and is worried this is a DVT.   Dr Marin Olp placed an order for dopplers to r/o DVT.  Patient aware of order. Will call to schedule [place].

## 2016-08-03 ENCOUNTER — Ambulatory Visit (HOSPITAL_BASED_OUTPATIENT_CLINIC_OR_DEPARTMENT_OTHER)
Admission: RE | Admit: 2016-08-03 | Discharge: 2016-08-03 | Disposition: A | Discharge status: Home / Self Care | Payer: 59 | Source: Ambulatory Visit | Attending: Hematology & Oncology | Admitting: Hematology & Oncology

## 2016-08-03 ENCOUNTER — Encounter: Payer: Self-pay | Admitting: *Deleted

## 2016-08-03 DIAGNOSIS — M25572 Pain in left ankle and joints of left foot: Secondary | ICD-10-CM | POA: Insufficient documentation

## 2016-08-03 DIAGNOSIS — M25571 Pain in right ankle and joints of right foot: Secondary | ICD-10-CM | POA: Diagnosis not present

## 2016-08-04 ENCOUNTER — Other Ambulatory Visit: Payer: Self-pay | Admitting: Hematology & Oncology

## 2016-08-04 ENCOUNTER — Ambulatory Visit (INDEPENDENT_AMBULATORY_CARE_PROVIDER_SITE_OTHER): Payer: 59 | Admitting: Internal Medicine

## 2016-08-04 DIAGNOSIS — R0602 Shortness of breath: Secondary | ICD-10-CM | POA: Diagnosis not present

## 2016-08-04 LAB — PULMONARY FUNCTION TEST
DL/VA % pred: 99 %
DL/VA: 4.88 ml/min/mmHg/L
DLCO COR % PRED: 85 %
DLCO COR: 21.93 ml/min/mmHg
DLCO UNC % PRED: 87 %
DLCO unc: 22.38 ml/min/mmHg
FEF 25-75 Post: 2.05 L/sec
FEF 25-75 Pre: 1.49 L/sec
FEF2575-%CHANGE-POST: 37 %
FEF2575-%Pred-Post: 81 %
FEF2575-%Pred-Pre: 58 %
FEV1-%CHANGE-POST: 10 %
FEV1-%PRED-POST: 83 %
FEV1-%Pred-Pre: 76 %
FEV1-POST: 2.28 L
FEV1-Pre: 2.07 L
FEV1FVC-%CHANGE-POST: 2 %
FEV1FVC-%Pred-Pre: 92 %
FEV6-%Change-Post: 8 %
FEV6-%PRED-PRE: 82 %
FEV6-%Pred-Post: 89 %
FEV6-PRE: 2.77 L
FEV6-Post: 3.01 L
FEV6FVC-%Change-Post: 0 %
FEV6FVC-%PRED-PRE: 101 %
FEV6FVC-%Pred-Post: 102 %
FVC-%CHANGE-POST: 7 %
FVC-%Pred-Post: 86 %
FVC-%Pred-Pre: 80 %
FVC-POST: 3.04 L
FVC-PRE: 2.83 L
POST FEV6/FVC RATIO: 99 %
PRE FEV6/FVC RATIO: 98 %
Post FEV1/FVC ratio: 75 %
Pre FEV1/FVC ratio: 73 %
RV % pred: 80 %
RV: 1.6 L
TLC % pred: 85 %
TLC: 4.43 L

## 2016-08-04 NOTE — Progress Notes (Signed)
PFT done today. 

## 2016-08-08 ENCOUNTER — Encounter: Payer: Self-pay | Admitting: Hematology & Oncology

## 2016-08-08 ENCOUNTER — Encounter: Payer: Self-pay | Admitting: Internal Medicine

## 2016-08-12 ENCOUNTER — Ambulatory Visit (INDEPENDENT_AMBULATORY_CARE_PROVIDER_SITE_OTHER): Payer: 59 | Admitting: Internal Medicine

## 2016-08-12 ENCOUNTER — Encounter: Payer: Self-pay | Admitting: Internal Medicine

## 2016-08-12 DIAGNOSIS — R0602 Shortness of breath: Secondary | ICD-10-CM | POA: Diagnosis not present

## 2016-08-12 NOTE — Assessment & Plan Note (Signed)
She is concerned about her heart and we discussed that a stress test would be the only test left to rule out blockages. She has had echo without abnormality and CT chest without changes. PFTs reviewed with her and normal. She is a non-smoker. We talked about how her weight is likely affecting her lung volume as well and she is working to get some active but is limited by joint pains.

## 2016-08-12 NOTE — Patient Instructions (Signed)
The next step is the stress test for the heart.  Think about doing the compression socks 10-20 mmHg for the grade.

## 2016-08-12 NOTE — Progress Notes (Signed)
Pre visit review using our clinic review tool, if applicable. No additional management support is needed unless otherwise documented below in the visit note. 

## 2016-08-12 NOTE — Progress Notes (Signed)
   Subjective:    Patient ID: Cassie [name], female    DOB: 11-13-1960, 56 y.o.   MRN: [id number]  HPI The patient is a 56 YO female coming in for SOB ongoing. She wants to discuss the results of her PFTs in detail. She was trying to look on mychart and the numbers were confusing. She is still having some SOB which is now intermittent. She sometimes is able to walk without SOB and sometimes she will get SOB with minimal activity. She is not a smoker. Taking eliquis for blood clot for about 3-4 months now. Has had improvement overall in symptoms since eliquis.   Review of Systems  Constitutional: Positive for activity change and fatigue. Negative for appetite change, chills, fever and unexpected weight change.  Respiratory: Positive for chest tightness and shortness of breath. Negative for apnea, cough and wheezing.   Cardiovascular: Negative.   Gastrointestinal: Negative.   Musculoskeletal: Positive for arthralgias, gait problem and myalgias.  Skin: Negative.   Hematological: Negative.       Objective:   Physical Exam  Constitutional: She is oriented to person, place, and time. She appears well-developed and well-nourished.  Obese  HENT:  Head: Normocephalic and atraumatic.  Eyes: EOM are normal.  Neck: Normal range of motion.  Cardiovascular: Normal rate and regular rhythm.   Pulmonary/Chest: Effort normal and breath sounds normal. No respiratory distress. She has no wheezes. She has no rales. She exhibits no tenderness.  Abdominal: Soft. She exhibits no distension. There is no tenderness.  Neurological: She is alert and oriented to person, place, and time.  Skin: Skin is warm and dry.   Vitals:   08/12/16 1029  BP: 122/74  Pulse: 96  Resp: 16  Temp: 98.4 F (36.9 C)  TempSrc: Oral  SpO2: 98%  Weight: (!) 310 lb (140.6 kg)  Height: 5' 6"  (1.676 m)      Assessment & Plan:  Visit time 25 minutes: greater than 50% of that time was spent in face to face counseling with the  patient about various causes of SOB and her personal results from PFTs and other imaging and testing which has been performed looking for the cause as well as alternative testing or causes which are still left open.

## 2016-08-17 ENCOUNTER — Other Ambulatory Visit: Payer: Self-pay | Admitting: Internal Medicine

## 2016-08-18 ENCOUNTER — Telehealth: Payer: Self-pay | Admitting: Cardiovascular Disease

## 2016-08-18 NOTE — Telephone Encounter (Signed)
New Message  Pt call requesting to switch providers from Dr. Johnsie Cancel to Dr. Tamala Julian. Pt thanks Dr. Johnsie Cancel for his services but she would like to have a second option. would it be okay to make the switch.

## 2016-08-18 NOTE — Telephone Encounter (Signed)
yes

## 2016-08-20 NOTE — Telephone Encounter (Signed)
Reviewed chart. Have nothing to add in this situation.

## 2016-08-29 ENCOUNTER — Other Ambulatory Visit: Payer: Self-pay | Admitting: Hematology & Oncology

## 2016-09-05 ENCOUNTER — Encounter: Payer: Self-pay | Admitting: Hematology & Oncology

## 2016-09-07 ENCOUNTER — Other Ambulatory Visit: Payer: Self-pay | Admitting: Hematology & Oncology

## 2016-09-07 ENCOUNTER — Telehealth: Payer: Self-pay | Admitting: Hematology & Oncology

## 2016-09-07 ENCOUNTER — Encounter: Payer: Self-pay | Admitting: Hematology & Oncology

## 2016-09-07 DIAGNOSIS — I2699 Other pulmonary embolism without acute cor pulmonale: Secondary | ICD-10-CM

## 2016-09-07 MED ORDER — RIVAROXABAN 20 MG PO TABS: 20.0000 mg | ORAL_TABLET | Freq: Every day | ORAL | 6 refills | Status: DC

## 2016-09-07 NOTE — Telephone Encounter (Signed)
I spoke with Cassie [name] this morning. She has been complaining of a lot of generalized pain with ELIQUIS. I am not sure this is from [facility]. I looked on the [facility] information site. There really is not anything mentioned about generalized pain. However, she gets on the Internet and sees other people have had pain.  We will switch her over to Xarelto. Hopefully she will do okay on Xarelto. I told her that I thought that Xarelto should be okay with her having the ulcerative colitis.  I told her she has to stay well hydrated. Adding this is very important.  We will make the switch over to Xarelto. I told her to not take any blood thinner for 1 day. That way, the ELIQUIS will be washed out of her system.  I think that she should do okay on the Xarelto. Again I am still not sure that ELIQUIS is causing all of the pain issues. I will speak with the cells representative to see if she has had any reports of generalized pain.  [name], MD

## 2016-09-09 ENCOUNTER — Ambulatory Visit (HOSPITAL_BASED_OUTPATIENT_CLINIC_OR_DEPARTMENT_OTHER)
Admission: RE | Admit: 2016-09-09 | Discharge: 2016-09-09 | Disposition: A | Discharge status: Home / Self Care | Payer: 59 | Source: Ambulatory Visit | Attending: Hematology & Oncology | Admitting: Hematology & Oncology

## 2016-09-09 ENCOUNTER — Other Ambulatory Visit (HOSPITAL_BASED_OUTPATIENT_CLINIC_OR_DEPARTMENT_OTHER): Payer: 59

## 2016-09-09 ENCOUNTER — Encounter (HOSPITAL_BASED_OUTPATIENT_CLINIC_OR_DEPARTMENT_OTHER): Payer: Self-pay

## 2016-09-09 ENCOUNTER — Ambulatory Visit (HOSPITAL_BASED_OUTPATIENT_CLINIC_OR_DEPARTMENT_OTHER): Payer: 59 | Admitting: Hematology & Oncology

## 2016-09-09 VITALS — BP 134/69 | HR 66 | Temp 98.6°F | Resp 20 | Wt 315.8 lb

## 2016-09-09 DIAGNOSIS — I898 Other specified noninfective disorders of lymphatic vessels and lymph nodes: Secondary | ICD-10-CM | POA: Insufficient documentation

## 2016-09-09 DIAGNOSIS — M79604 Pain in right leg: Secondary | ICD-10-CM | POA: Diagnosis not present

## 2016-09-09 DIAGNOSIS — I2510 Atherosclerotic heart disease of native coronary artery without angina pectoris: Secondary | ICD-10-CM | POA: Insufficient documentation

## 2016-09-09 DIAGNOSIS — E538 Deficiency of other specified B group vitamins: Secondary | ICD-10-CM

## 2016-09-09 DIAGNOSIS — R0602 Shortness of breath: Secondary | ICD-10-CM | POA: Diagnosis not present

## 2016-09-09 DIAGNOSIS — I2699 Other pulmonary embolism without acute cor pulmonale: Secondary | ICD-10-CM

## 2016-09-09 DIAGNOSIS — M2550 Pain in unspecified joint: Secondary | ICD-10-CM

## 2016-09-09 DIAGNOSIS — M25562 Pain in left knee: Secondary | ICD-10-CM

## 2016-09-09 DIAGNOSIS — Z86711 Personal history of pulmonary embolism: Secondary | ICD-10-CM | POA: Diagnosis not present

## 2016-09-09 LAB — CBC WITH DIFFERENTIAL (CANCER CENTER ONLY)
BASO#: 0 10*3/uL (ref 0.0–0.2)
BASO%: 0.1 % (ref 0.0–2.0)
EOS ABS: 0.3 10*3/uL (ref 0.0–0.5)
EOS%: 2.8 % (ref 0.0–7.0)
HCT: 40.4 % (ref 34.8–46.6)
HGB: 12.8 g/dL (ref 11.6–15.9)
LYMPH#: 1.9 10*3/uL (ref 0.9–3.3)
LYMPH%: 19.2 % (ref 14.0–48.0)
MCH: 30.4 pg (ref 26.0–34.0)
MCHC: 31.7 g/dL — ABNORMAL LOW (ref 32.0–36.0)
MCV: 96 fL (ref 81–101)
MONO#: 0.8 10*3/uL (ref 0.1–0.9)
MONO%: 7.9 % (ref 0.0–13.0)
NEUT#: 6.8 10*3/uL — ABNORMAL HIGH (ref 1.5–6.5)
NEUT%: 70 % (ref 39.6–80.0)
PLATELETS: 194 10*3/uL (ref 145–400)
RBC: 4.21 10*6/uL (ref 3.70–5.32)
RDW: 13.8 % (ref 11.1–15.7)
WBC: 9.7 10*3/uL (ref 3.9–10.0)

## 2016-09-09 LAB — CMP (CANCER CENTER ONLY)
ALT(SGPT): 21 U/L (ref 10–47)
AST: 17 U/L (ref 11–38)
Albumin: 3.1 g/dL — ABNORMAL LOW (ref 3.3–5.5)
Alkaline Phosphatase: 76 U/L (ref 26–84)
BUN: 7 mg/dL (ref 7–22)
CHLORIDE: 105 meq/L (ref 98–108)
CO2: 26 meq/L (ref 18–33)
CREATININE: 0.8 mg/dL (ref 0.6–1.2)
Calcium: 9.3 mg/dL (ref 8.0–10.3)
Glucose, Bld: 92 mg/dL (ref 73–118)
Potassium: 4.2 mEq/L (ref 3.3–4.7)
SODIUM: 137 meq/L (ref 128–145)
TOTAL PROTEIN: 7 g/dL (ref 6.4–8.1)
Total Bilirubin: 0.4 mg/dl (ref 0.20–1.60)

## 2016-09-09 MED ORDER — TRAMADOL HCL 50 MG PO TABS: 50.0000 mg | ORAL_TABLET | Freq: Four times a day (QID) | ORAL | 0 refills | Status: DC | PRN

## 2016-09-09 MED ORDER — IOPAMIDOL (ISOVUE-370) INJECTION 76%
100.0000 mL | Freq: Once | INTRAVENOUS | Status: AC | PRN
  Administered 2016-09-09: 100 mL via INTRAVENOUS

## 2016-09-10 ENCOUNTER — Encounter: Payer: Self-pay | Admitting: Hematology & Oncology

## 2016-09-11 NOTE — Progress Notes (Signed)
Hematology and Oncology Follow Up Visit  Cassie [name] [id number] 10-24-1960 56 y.o. 09/11/2016   Principle Diagnosis:   Idiopathic right lower lobe pulmonary embolism  Ulcerative colitis  Current Therapy:    ELIQUIS 5 mg by mouth twice a day     Interim History:  Ms. Gillihan is in for follow-up. She thinks that the [facility] is causing her pain problems. I would think this would be highly and likely. She is having pain in her joints. She has a lot of pain in her right leg. She has degenerative changes in her back. Actively that because of her degenerative issues in the back, this probably is a source of her leg pain.  I offered to switch her to Xarelto. Unfortunately, she gets on the Internet and looks up things. She is worried about the bleeding with Xarelto. I told her that bleeding was Xarelto probably is no worse than with ELIQUIS. The FDA now has approved a reversal agent for Xarelto-Andexxa.  We did repeat her CT angiogram. There is no residual pulmonary embolus. We did a Doppler of her legs. No blood clot was noted in her legs.  She's had no cough. She's had no fever. There's been no rashes.  She is afraid of traveling because of the vein. I'm not sure what we can do about this.  I told her that if she wanted to try Motrin she could. She does have to take with some food. She is afraid of doing this. I did give her a prescription for Ultram. Maybe, this will give her some relief.   Her last d-dimer was 0.35 back in March.  We've done hypercoagulable studies on her. Everything has come back negative.  Overall, her performance status is ECOG 1.    Medications:  Current Outpatient Prescriptions:  .  ALPRAZolam (XANAX) 1 MG tablet, TAKE 1/2 TO 1 TABLET BY MOUTH 2 TIMES A DAY AS NEEDED FOR ANXIETY, Disp: 60 tablet, Rfl: 1 .  atenolol (TENORMIN) 50 MG tablet, Take 0.5 tablets (25 mg total) by mouth 2 (two) times daily., Disp: 90 tablet, Rfl: 3 .  Cholecalciferol (VITAMIN  D3) 2000 units capsule, Take 4,000 Units by mouth daily with lunch., Disp: , Rfl:  .  ELIQUIS 5 MG TABS tablet, Take 5 mg by mouth., Disp: , Rfl:  .  famotidine (PEPCID) 20 MG tablet, Take 1 tablet (20 mg total) by mouth 2 (two) times daily., Disp: 60 tablet, Rfl: 11 .  folic acid (FOLVITE) 1 MG tablet, Take 1 tablet (1 mg total) by mouth daily., Disp: 90 tablet, Rfl: 6 .  loperamide (IMODIUM A-D) 2 MG tablet, Take 2 mg by mouth 4 (four) times daily as needed for diarrhea or loose stools., Disp: , Rfl:  .  Polyethyl Glycol-Propyl Glycol (SYSTANE OP), Place 1 drop into both eyes daily as needed (dry eyes)., Disp: , Rfl:  .  Probiotic Product (PROBIOTIC DAILY PO), Take 2 capsules by mouth daily with lunch. , Disp: , Rfl:  .  rivaroxaban (XARELTO) 20 MG TABS tablet, Take 1 tablet (20 mg total) by mouth daily with supper., Disp: 30 tablet, Rfl: 6 .  Simethicone (GAS-X PO), Take 1 tablet by mouth 4 (four) times daily as needed (For gas.). Do not exceed 6 tablets in a 24 hour period., Disp: , Rfl:  .  traMADol (ULTRAM) 50 MG tablet, Take 1 tablet (50 mg total) by mouth every 6 (six) hours as needed., Disp: 60 tablet, Rfl: 0 .  vitamin B-12 (  CYANOCOBALAMIN) 500 MCG tablet, Take 500 mcg by mouth daily with lunch., Disp: , Rfl:   Allergies:  Allergies  Allergen Reactions  . Doxycycline Palpitations  . Hydrocodone Other (See Comments)    Hallucinations   . Sudafed [Pseudoephedrine Hcl] Shortness Of Breath  . Acular [Ketorolac Tromethamine]     Unknown reaction   . Diphenhydramine Hcl Swelling    Throat feels like it swells   . Ivp Dye [Iodinated Diagnostic Agents]     Irregular heart beat - reaction to IV for CT - not oral    . Lactose Intolerance (Gi) Diarrhea and Other (See Comments)    bloating  . Mesalamine Other (See Comments)    Hair loss  . Sulfasalazine Diarrhea  . Azithromycin Palpitations    Speeding heart rate per pt.   . Caffeine Palpitations  . Penicillins Rash    Has patient  had a PCN reaction causing immediate rash, facial/tongue/throat swelling, SOB or lightheadedness with hypotension: Yes Has patient had a PCN reaction causing severe rash involving mucus membranes or skin necrosis: No Has patient had a PCN reaction that required hospitalization Yes Has patient had a PCN reaction occurring within the last 10 years: No If all of the above answers are "NO", then may proceed with Cephalosporin use.  . Prednisone Palpitations    Past Medical History, Surgical history, Social history, and Family History were reviewed and updated.  Review of Systems: As above  Physical Exam:  weight is 315 lb 12.8 oz (143.2 kg) (abnormal). Her oral temperature is 98.6 F (37 C). Her blood pressure is 134/69 and her pulse is 66. Her respiration is 20 and oxygen saturation is 96%.   Wt Readings from Last 3 Encounters:  09/09/16 (!) 315 lb 12.8 oz (143.2 kg)  08/12/16 (!) 310 lb (140.6 kg)  07/15/16 (!) 303 lb (137.4 kg)     Obese white female in no obvious distress. Head and neck exam shows no ocular or oral lesions. She has no palpable cervical or supraclavicular lymph nodes. Lungs are clear bilaterally. I hear no wheezes. I hear no rhonchi. I cannot detect any friction rub. Cardiac exam regular rate and rhythm with no murmurs, rubs or bruits. Abdomen is obese but soft. She has decent bowel sounds. There is no guarding or rebound tenderness. There is no fluid wave. Extremities shows no clubbing, cyanosis or edema. She has chronic nonpitting edema of her legs. No obvious venous cords noted. She has a negative Homans sign bilaterally. Back exam shows some minimal focal swelling in the thoracic or lumbar region. No obvious spasms are noted. Neurological exam shows no focal neurological deficits.  Lab Results  Component Value Date   WBC 9.7 09/09/2016   HGB 12.8 09/09/2016   HCT 40.4 09/09/2016   MCV 96 09/09/2016   PLT 194 09/09/2016     Chemistry      Component Value  Date/Time   NA 137 09/09/2016 1332   K 4.2 09/09/2016 1332   CL 105 09/09/2016 1332   CO2 26 09/09/2016 1332   BUN 7 09/09/2016 1332   CREATININE 0.8 09/09/2016 1332      Component Value Date/Time   CALCIUM 9.3 09/09/2016 1332   ALKPHOS 76 09/09/2016 1332   AST 17 09/09/2016 1332   ALT 21 09/09/2016 1332   BILITOT 0.40 09/09/2016 1332         Impression and Plan: Ms. Heine is A 56 year old white female. She had a pulmonary embolism in the right lower  lobe. Thankfully this was not a large embolism. She had been on estrogen therapy .  She does not have any thrombophilic condition.  I just do not believe that her current symptoms are secondary to [facility]. We have had a lot of people ELIQUIS without any problems.  For now, we'll just keep her on ELIQUIS. I think she needs a year of fullness ELIQUIS. Then we might be able to get her on maintenance therapy at 2.5 mg twice a day.  Hopefully, the fact that she does not have any residual thromboembolic disease will give her some reassurance.  I told her to make sure she drinks a lot of fluid. This may help her pain a little bit.  We will plan to get her back in another couple months.  I spent about 40 minutes with she and her husband.    Volanda Napoleon, MD 5/6/20188:22 AM

## 2016-09-12 ENCOUNTER — Encounter: Payer: Self-pay | Admitting: Hematology & Oncology

## 2016-09-12 ENCOUNTER — Encounter: Payer: Self-pay | Admitting: Cardiovascular Disease

## 2016-09-12 LAB — D-DIMER, QUANTITATIVE: D-DIMER: 0.23 mg/L FEU (ref 0.00–0.49)

## 2016-09-15 NOTE — Telephone Encounter (Signed)
Cassie [name], please see where we can work her in.

## 2016-09-16 ENCOUNTER — Ambulatory Visit: Payer: 59 | Admitting: Internal Medicine

## 2016-09-19 ENCOUNTER — Telehealth: Payer: Self-pay | Admitting: Interventional Cardiology

## 2016-09-19 NOTE — Telephone Encounter (Signed)
New Message   Pt states she is switching doctors and was supposed to get a call from the nurse, states she missed a call and is not sure if it was you. Requests call back. States she used to be Dr. Kyla Balzarine pt.

## 2016-09-19 NOTE — Telephone Encounter (Signed)
Based on prior messages, it sounds like the patient and Dr. Johnsie Cancel had discussed a second opinion from Dr. Tamala Julian. I am happy to see her, but we should double check to make sure that the visit is not supposed to be with Dr. Tamala Julian. Thanks.

## 2016-09-19 NOTE — Telephone Encounter (Signed)
See patient email in encounters 09/12/16.

## 2016-09-20 NOTE — Telephone Encounter (Signed)
Talked to patient about seeing Dr. Saunders Revel for second opinion. Patient agreed to see Dr. Saunders Revel. Appointment was made for 09/29/16. Patient verbalized understanding.

## 2016-09-20 NOTE — Telephone Encounter (Signed)
Ok

## 2016-09-20 NOTE — Telephone Encounter (Signed)
Copied from Alexander message:  Sep 15, 2016  Belva Crome, MD      1:19 PM  Note    Okay. Anderson Malta, please see where we can work her in.

## 2016-09-21 ENCOUNTER — Telehealth: Payer: Self-pay | Admitting: *Deleted

## 2016-09-21 ENCOUNTER — Encounter: Payer: Self-pay | Admitting: Hematology & Oncology

## 2016-09-21 ENCOUNTER — Other Ambulatory Visit: Payer: Self-pay | Admitting: Hematology & Oncology

## 2016-09-21 NOTE — Telephone Encounter (Signed)
Patient c/o shin pain. Her R lower leg is sore. She describes it as a bone pain to her inner leg. She denies warmth and swelling. Patient states she is compliant with her Eliquis taking it as prescribed.   Reviewed with Laverna Peace NP. Patient is on Eliquis. Judson Roch wants to patient to rest the leg and continue to observe. If swelling, redness or warmth develops she can call the office back. Patient understands.

## 2016-09-28 ENCOUNTER — Encounter: Payer: Self-pay | Admitting: Hematology & Oncology

## 2016-09-29 ENCOUNTER — Ambulatory Visit: Payer: 59 | Admitting: Internal Medicine

## 2016-09-30 ENCOUNTER — Encounter: Payer: Self-pay | Admitting: Internal Medicine

## 2016-09-30 ENCOUNTER — Ambulatory Visit (INDEPENDENT_AMBULATORY_CARE_PROVIDER_SITE_OTHER): Payer: 59 | Admitting: Internal Medicine

## 2016-09-30 VITALS — BP 118/70 | HR 72 | Ht 65.0 in | Wt 313.1 lb

## 2016-09-30 DIAGNOSIS — R002 Palpitations: Secondary | ICD-10-CM | POA: Diagnosis not present

## 2016-09-30 DIAGNOSIS — I2584 Coronary atherosclerosis due to calcified coronary lesion: Secondary | ICD-10-CM | POA: Diagnosis not present

## 2016-09-30 DIAGNOSIS — I2510 Atherosclerotic heart disease of native coronary artery without angina pectoris: Secondary | ICD-10-CM

## 2016-09-30 DIAGNOSIS — Z86711 Personal history of pulmonary embolism: Secondary | ICD-10-CM | POA: Diagnosis not present

## 2016-09-30 DIAGNOSIS — R0602 Shortness of breath: Secondary | ICD-10-CM

## 2016-09-30 NOTE — Patient Instructions (Signed)
Medication Instructions:  Your physician recommends that you continue on your current medications as directed. Please refer to the Current Medication list given to you today.   Labwork: None ordered  Testing/Procedures: Your physician has requested that you have cardiac CT AT THE END OF June/BEGINNING OF July/. Cardiac computed tomography (CT) is a painless test that uses an x-ray machine to take clear, detailed pictures of your heart. For further information please visit [email or web address]. Please follow instruction sheet as given.     Follow-Up: Your physician recommends that you schedule a follow-up appointment in: 3 MONTHS WITH DR. END   Any Other Special Instructions Will Be Listed Below (If Applicable).   Cardiac CT Angiogram A cardiac CT angiogram is a procedure to look at the heart and the area around the heart. It may be done to help find the cause of chest pains or other symptoms of heart disease. During this procedure, a large X-ray machine, called a CT scanner, takes detailed pictures of the heart and the surrounding area after a dye (contrast material) has been injected into blood vessels in the area. The procedure is also sometimes called a coronary CT angiogram, coronary artery scanning, or CTA. A cardiac CT angiogram allows the health care provider to see how well blood is flowing to and from the heart. The health care provider will be able to see if there are any problems, such as:  Blockage or narrowing of the coronary arteries in the heart.  Fluid around the heart.  Signs of weakness or disease in the muscles, valves, and tissues of the heart. Tell a health care provider about:  Any allergies you have. This is especially important if you have had a previous allergic reaction to contrast dye.  All medicines you are taking, including vitamins, herbs, eye drops, creams, and over-the-counter medicines.  Any blood disorders you have.  Any surgeries you have  had.  Any medical conditions you have.  Whether you are pregnant or may be pregnant.  Any anxiety disorders, chronic pain, or other conditions you have that may increase your stress or prevent you from lying still. What are the risks? Generally, this is a safe procedure. However, problems may occur, including:  Bleeding.  Infection.  Allergic reactions to medicines or dyes.  Damage to other structures or organs.  Kidney damage from the dye or contrast that is used.  Increased risk of cancer from radiation exposure. This risk is low. Talk with your health care provider about:  The risks and benefits of testing.  How you can receive the lowest dose of radiation. What happens before the procedure?  Wear comfortable clothing and remove any jewelry, glasses, dentures, and hearing aids.  Follow instructions from your health care provider about eating and drinking. This may include:  For 12 hours before the test - avoid caffeine. This includes tea, coffee, soda, energy drinks, and diet pills. Drink plenty of water or other fluids that do not have caffeine in them. Being well-hydrated can prevent complications.  For 4-6 hours before the test - stop eating and drinking. The contrast dye can cause nausea, but this is less likely if your stomach is empty.  Ask your health care provider about changing or stopping your regular medicines. This is especially important if you are taking diabetes medicines, blood thinners, or medicines to treat erectile dysfunction. What happens during the procedure?  Hair on your chest may need to be removed so that small sticky patches called electrodes can be placed  on your chest. These will transmit information that helps to monitor your heart during the test.  An IV tube will be inserted into one of your veins.  You might be given a medicine to control your heart rate during the test. This will help to ensure that good images are obtained.  You will  be asked to lie on an exam table. This table will slide in and out of the CT machine during the procedure.  Contrast dye will be injected into the IV tube. You might feel warm, or you may get a metallic taste in your mouth.  You will be given a medicine (nitroglycerin) to relax (dilate) the arteries in your heart.  The table that you are lying on will move into the CT machine tunnel for the scan.  The person running the machine will give you instructions while the scans are being done. You may be asked to:  Keep your arms above your head.  Hold your breath.  Stay very still, even if the table is moving.  When the scanning is complete, you will be moved out of the machine.  The IV tube will be removed. The procedure may vary among health care providers and hospitals. What happens after the procedure?  You might feel warm, or you may get a metallic taste in your mouth from the contrast dye.  You may have a headache from the nitroglycerin.  After the procedure, drink water or other fluids to wash (flush) the contrast material out of your body.  Contact a health care provider if you have any symptoms of allergy to the contrast. These symptoms include:  Shortness of breath.  Rash or hives.  A racing heartbeat.  Most people can return to their normal activities right after the procedure. Ask your health care provider what activities are safe for you.  It is up to you to get the results of your procedure. Ask your health care provider, or the department that is doing the procedure, when your results will be ready. Summary  A cardiac CT angiogram is a procedure to look at the heart and the area around the heart. It may be done to help find the cause of chest pains or other symptoms of heart disease.  During this procedure, a large X-ray machine, called a CT scanner, takes detailed pictures of the heart and the surrounding area after a dye (contrast material) has been injected into  blood vessels in the area.  Ask your health care provider about changing or stopping your regular medicines before the procedure. This is especially important if you are taking diabetes medicines, blood thinners, or medicines to treat erectile dysfunction.  After the procedure, drink water or other fluids to wash (flush) the contrast material out of your body. This information is not intended to replace advice given to you by your health care provider. Make sure you discuss any questions you have with your health care provider. Document Released: 04/07/2008 Document Revised: 03/14/2016 Document Reviewed: 03/14/2016 Elsevier Interactive Patient Education  2017 [employer].    If you need a refill on your cardiac medications before your next appointment, please call your pharmacy.

## 2016-09-30 NOTE — Progress Notes (Signed)
Outpatient Visit Date: 09/30/2016  Primary Care Provider: Hoyt Koch, MD [place] [id number]  Chief Complaint: Shortness of breath and palpitations  HPI:  Cassie [name] is a 56 y.o. year-old female with history of long-standing palpitations with PVCs previously noted on event monitor, ulcerative colitis, anxiety, vitamin B12 deficiency, and vitamin D deficiency who presents for a second opinion regarding shortness of breath. Based on review of her chart, Cassie [name] has complained of palpitations and shortness of breath for over a decade, with workup this far revealing only PVCs and PE diagnosed earlier this year now followed by Dr. Marin Olp. She injured her right knee in 01/2016 and was immobile for several months thereafter. However, around 04/2016, she began noticing worsening shortness of breath with activity. In 06/2016, she was found to have a pulmonary embolism in the right lower lobe, for which she remains on apixaban. Despite treatment of her PE (which has resolved on repeat CT performed earlier this month), Cassie [name] reports that her shortness of breath has continued to worsen. She now feels out of breath just walking across the room. At times, she even feels a little winded when sitting still. She denies orthopnea and PND. She underwent PFTs in March, and was told that there were no lung findings to explain her dyspnea.  The patient notes occasional vague chest discomfort that she describes as indigestion or a "gassy feeling." She is unable to characterize the quality and intensity in more detail. I can occur randomly throughout the day and is not associated with the aforementioned shortness of breath. She feels as though her legs are more swollen than usual, though this seems to fluctuate throughout the day. She also notices swelling in her hands when she awakens some mornings. She does not snore and usually feels well-rested in the morning. She has never  undergone a sleep study, though this has been recommended to her in the past.  Finally, Cassie [name] notes that her palpitations seem to have worsened in frequency over the last 3 years. She has brief flutters in the chest lasting only a few seconds at a time. She has occasional lightheadedness that accompanies the palpitations, though she has never fallen or passed out. She continues to take atenolol, but titrates the dose based on her resting heart rate at home. She notes that she had lost about 75 pounds in the past but has gradually regained most of this weight over the last year. She does not exercise on a regular basis. She does not drink any caffeinated beverages and rarely eats chocolate.  --------------------------------------------------------------------------------------------------  Cardiovascular History & Procedures: Cardiovascular Problems:  Dyspnea on exertion  Palpitations  Pulmonary embolism  Risk Factors:  Coronary artery calcium on CT, obesity, and sedentary lifestyle  Cath/PCI:  None  CV Surgery:  None  EP Procedures and Devices:  Multiple monitors showing PVCs.  Non-Invasive Evaluation(s):  CTA chest (09/09/16): No PE. Coronary artery calcification involving the LAD is noted. No significant lung abnormalities. - I have personally reviewed the images.  TTE (07/04/16): Nromal LV size and contraction (LVEF 55-60%) with normal diastolic function. Normal RV size and function. No significant valvular abnormalities.  Pharmacologic MPI (01/01/04): Abnormal study without ischemia or scar. No significant EKG abnormalities. Normal LV size and function.  Recent CV Pertinent Labs: Lab Results  Component Value Date   CHOL 175 12/25/2015   HDL 40.30 12/25/2015   LDLCALC 114 (H) 12/25/2015   TRIG 103.0 12/25/2015   CHOLHDL 4  12/25/2015   BNP 54.6 07/04/2016   K 4.2 09/09/2016   MG 2.1 02/07/2011   BUN 7 09/09/2016   CREATININE 0.8 09/09/2016    Past medical  and surgical history were reviewed and updated in EPIC.  Outpatient Encounter Prescriptions as of 09/30/2016  Medication Sig  . ALPRAZolam (XANAX) 1 MG tablet TAKE 1/2 TO 1 TABLET BY MOUTH 2 TIMES A DAY AS NEEDED FOR ANXIETY  . atenolol (TENORMIN) 50 MG tablet Take 0.5 tablets (25 mg total) by mouth 2 (two) times daily.  . Cholecalciferol (VITAMIN D3) 2000 units capsule Take 4,000 Units by mouth daily with lunch.  Marland Kitchen ELIQUIS 5 MG TABS tablet TAKE 1 TABLET BY MOUTH TWICE DAILY  . folic acid (FOLVITE) 109 MCG tablet Take 800 mcg by mouth daily.  Marland Kitchen loperamide (IMODIUM A-D) 2 MG tablet Take 2 mg by mouth 4 (four) times daily as needed for diarrhea or loose stools.  Vladimir Faster Glycol-Propyl Glycol (SYSTANE OP) Place 1 drop into both eyes daily as needed (dry eyes).  . Probiotic Product (PROBIOTIC DAILY PO) Take 2 capsules by mouth daily with lunch.   . Simethicone (GAS-X PO) Take 1 tablet by mouth 4 (four) times daily as needed (For gas.). Do not exceed 6 tablets in a 24 hour period.  . vitamin B-12 (CYANOCOBALAMIN) 500 MCG tablet Take 500 mcg by mouth daily with lunch.  . [DISCONTINUED] ELIQUIS 5 MG TABS tablet Take 5 mg by mouth.  . [DISCONTINUED] famotidine (PEPCID) 20 MG tablet Take 1 tablet (20 mg total) by mouth 2 (two) times daily. (Patient not taking: Reported on 09/30/2016)  . [DISCONTINUED] folic acid (FOLVITE) 1 MG tablet Take 1 tablet (1 mg total) by mouth daily. (Patient not taking: Reported on 09/30/2016)  . [DISCONTINUED] rivaroxaban (XARELTO) 20 MG TABS tablet Take 1 tablet (20 mg total) by mouth daily with supper. (Patient not taking: Reported on 09/30/2016)  . [DISCONTINUED] traMADol (ULTRAM) 50 MG tablet Take 1 tablet (50 mg total) by mouth every 6 (six) hours as needed. (Patient not taking: Reported on 09/30/2016)  . [DISCONTINUED] traMADol (ULTRAM) 50 MG tablet Take 50 mg by mouth every 6 (six) hours as needed (Pain).   No facility-administered encounter medications on file as of  09/30/2016.     Allergies: Doxycycline; Hydrocodone; Sudafed [pseudoephedrine hcl]; Acular [ketorolac tromethamine]; Diphenhydramine hcl; Lactose intolerance (gi); Mesalamine; Sulfasalazine; Azithromycin; Caffeine; Penicillins; and Prednisone  Social History   Social History  . Marital status: Married    Spouse name: N/A  . Number of children: 0  . Years of education: N/A   Occupational History  . Housewife Unemployed   Social History Main Topics  . Smoking status: Former Smoker    Types: Cigarettes    Quit date: 03/07/1979  . Smokeless tobacco: Never Used  . Alcohol use No  . Drug use: No  . Sexual activity: Yes    Partners: Male   Other Topics Concern  . Not on file   Social History Narrative   Married: 0 kids   Regular exercise: walks 2 times a week   Caffeine use: none    Family History  Problem Relation Age of Onset  . Heart disease Father 50       pacemaker  . Atrial fibrillation Father   . Hypertension Maternal Aunt   . Hypertension Maternal Grandfather   . Colon cancer Neg Hx   . Stomach cancer Neg Hx   . Colon polyps Neg Hx   . Esophageal cancer  Neg Hx   . Rectal cancer Neg Hx     Review of Systems: Review of Systems  Constitutional: Positive for malaise/fatigue. Negative for fever and weight loss (50 pound weight gain over last year).  HENT: Positive for sore throat (Recovering from URI).   Eyes: Negative.   Respiratory: Positive for cough.   Cardiovascular: Positive for chest pain, palpitations and leg swelling. Negative for orthopnea, claudication and PND.  Gastrointestinal: Positive for heartburn.  Genitourinary: Negative.   Musculoskeletal: Positive for myalgias.  Skin: Negative.   Neurological: Negative.   Endo/Heme/Allergies: Negative.   Psychiatric/Behavioral: The patient is nervous/anxious (Under control).    --------------------------------------------------------------------------------------------------  Physical Exam: BP 118/70    Pulse 72   Ht 5' 5"  (1.651 m)   Wt (!) 313 lb 1.9 oz (142 kg)   SpO2 99%   BMI 52.11 kg/m   General:  Morbidly obese woman, seated comfortably on the exam table. She is accompanied by her husband. HEENT: No conjunctival pallor or scleral icterus.  Moist mucous membranes.  OP clear. Neck: Supple without lymphadenopathy, thyromegaly, JVD, or HJR, though evaluation is limited by body habitus.  No carotid bruit. Lungs: Normal work of breathing.  Clear to auscultation bilaterally without wheezes or crackles. Heart: Distant heart sounds. Regular rate and rhythm without murmurs, rubs, or gallops.  Unable to assess PMI due to body habitus. Abd: Bowel sounds present.  Soft with mild RLQ tenderness without guarding. Non-distended. Unable to assess HSM due to body habitus. Ext: Trace pretibial edema bilaterally.  2+ radial pulses bilaterally. 1+ PT/DP pulses bilaterally Skin: warm and dry without rash  EKG:  Sinus bradycardia (HR 59 bpm) without significant abnormalities.  Lab Results  Component Value Date   WBC 9.7 09/09/2016   HGB 12.8 09/09/2016   HCT 40.4 09/09/2016   MCV 96 09/09/2016   PLT 194 09/09/2016    Lab Results  Component Value Date   NA 137 09/09/2016   K 4.2 09/09/2016   CL 105 09/09/2016   CO2 26 09/09/2016   BUN 7 09/09/2016   CREATININE 0.8 09/09/2016   GLUCOSE 92 09/09/2016   ALT 21 09/09/2016    Lab Results  Component Value Date   CHOL 175 12/25/2015   HDL 40.30 12/25/2015   LDLCALC 114 (H) 12/25/2015   TRIG 103.0 12/25/2015   CHOLHDL 4 12/25/2015    --------------------------------------------------------------------------------------------------  ASSESSMENT AND PLAN: Dyspnea on exertion This has been a long-standing problem for the patient, though she reports that it has worsened over the last 5-6 months. During this time, she was also diagnosed with a right lower lobe pulmonary embolism for which she remains on apixaban. I suspect that morbid obesity  with weight gain over the last year and sedentary lifestyle that was complicated by immobility related to a right knee injury last year, are the driving factors behind her dyspnea. Cardiac work-up thus far has been unrevealing. However, mild coronary artery calcification was evident on her recent chest CT's. Given that her last ischemia evaluation was over 10 years ago, I think it is reasonable to exclude obstructive CAD as a contributing factor to her dyspnea. We discussed the risks and benefits of myocardial perfusion stress testing, coronary CTA, and cardiac catheterization, and have agreed to proceed with coronary CTA. Her recent echo showed normal LV/RV size and function, though I did not see a good estimation of pulmonary artery pressure on review of the images. If her dyspnea persists with negative coronary CTA, we may ultimately need to  consider RHC. I think that she would also benefit from a sleep study. I encouraged lifestyle modifications and weight loss.  Palpitations Cassie [name] has a history of PVCs. We will continue her current dose of atenolol.  Coronary artery calcification Incidentally noted on recent chest CT for evaluation of PE. Based on results of coronary CTA, we will need to consider addition of a statin for primary prevention.  Pulmonary embolism This appears to have been unprovoked and is being managed by Dr. Marin Olp. We will not make any medication changes today; Cassie [name] is to remain on apixaban.  Morbid obesity Weight loss and dietary changes encouraged.  Follow-up: Return to clinic in 3 months.  Greater than 45 minutes was spent on this encounter, including interviewing the patient and reviewing prior records; more than 50% of the time was spent face-to-face, counseling the patient and coordinating care.  Nelva Bush, MD 10/01/2016 2:13 PM

## 2016-10-04 ENCOUNTER — Encounter: Payer: Self-pay | Admitting: Internal Medicine

## 2016-10-09 ENCOUNTER — Encounter: Payer: Self-pay | Admitting: Hematology & Oncology

## 2016-10-10 ENCOUNTER — Other Ambulatory Visit: Payer: Self-pay | Admitting: Family

## 2016-10-10 ENCOUNTER — Other Ambulatory Visit (HOSPITAL_BASED_OUTPATIENT_CLINIC_OR_DEPARTMENT_OTHER): Payer: 59

## 2016-10-10 ENCOUNTER — Ambulatory Visit (HOSPITAL_BASED_OUTPATIENT_CLINIC_OR_DEPARTMENT_OTHER): Payer: 59 | Admitting: Family

## 2016-10-10 ENCOUNTER — Encounter (HOSPITAL_BASED_OUTPATIENT_CLINIC_OR_DEPARTMENT_OTHER): Payer: Self-pay

## 2016-10-10 ENCOUNTER — Ambulatory Visit (HOSPITAL_BASED_OUTPATIENT_CLINIC_OR_DEPARTMENT_OTHER)
Admission: RE | Admit: 2016-10-10 | Discharge: 2016-10-10 | Disposition: A | Discharge status: Home / Self Care | Payer: 59 | Source: Ambulatory Visit | Attending: Family | Admitting: Family

## 2016-10-10 VITALS — BP 103/54 | HR 64 | Temp 98.0°F | Resp 18 | Wt 312.0 lb

## 2016-10-10 DIAGNOSIS — R059 Cough, unspecified: Secondary | ICD-10-CM

## 2016-10-10 DIAGNOSIS — R05 Cough: Secondary | ICD-10-CM | POA: Insufficient documentation

## 2016-10-10 DIAGNOSIS — I2699 Other pulmonary embolism without acute cor pulmonale: Secondary | ICD-10-CM | POA: Diagnosis not present

## 2016-10-10 DIAGNOSIS — Z86718 Personal history of other venous thrombosis and embolism: Secondary | ICD-10-CM | POA: Diagnosis not present

## 2016-10-10 DIAGNOSIS — R0602 Shortness of breath: Secondary | ICD-10-CM | POA: Diagnosis not present

## 2016-10-10 DIAGNOSIS — D3501 Benign neoplasm of right adrenal gland: Secondary | ICD-10-CM | POA: Insufficient documentation

## 2016-10-10 LAB — CBC WITH DIFFERENTIAL (CANCER CENTER ONLY)
BASO#: 0 10*3/uL (ref 0.0–0.2)
BASO%: 0.3 % (ref 0.0–2.0)
EOS%: 8.1 % — AB (ref 0.0–7.0)
Eosinophils Absolute: 0.6 10*3/uL — ABNORMAL HIGH (ref 0.0–0.5)
HEMATOCRIT: 40.7 % (ref 34.8–46.6)
HEMOGLOBIN: 12.9 g/dL (ref 11.6–15.9)
LYMPH#: 1.3 10*3/uL (ref 0.9–3.3)
LYMPH%: 17.2 % (ref 14.0–48.0)
MCH: 30.1 pg (ref 26.0–34.0)
MCHC: 31.7 g/dL — AB (ref 32.0–36.0)
MCV: 95 fL (ref 81–101)
MONO#: 0.5 10*3/uL (ref 0.1–0.9)
MONO%: 5.9 % (ref 0.0–13.0)
NEUT%: 68.5 % (ref 39.6–80.0)
NEUTROS ABS: 5.3 10*3/uL (ref 1.5–6.5)
Platelets: 233 10*3/uL (ref 145–400)
RBC: 4.28 10*6/uL (ref 3.70–5.32)
RDW: 14 % (ref 11.1–15.7)
WBC: 7.7 10*3/uL (ref 3.9–10.0)

## 2016-10-10 LAB — COMPREHENSIVE METABOLIC PANEL (CC13)
ALBUMIN: 3.6 g/dL (ref 3.5–5.5)
ALT: 14 IU/L (ref 0–32)
AST (SGOT): 13 IU/L (ref 0–40)
Albumin/Globulin Ratio: 0.9 — ABNORMAL LOW (ref 1.2–2.2)
Alkaline Phosphatase, S: 91 IU/L (ref 39–117)
BUN / CREAT RATIO: 10 (ref 9–23)
BUN: 7 mg/dL (ref 6–24)
Bilirubin Total: 0.3 mg/dL (ref 0.0–1.2)
CALCIUM: 9.4 mg/dL (ref 8.7–10.2)
CO2: 26 mmol/L (ref 18–29)
CREATININE: 0.68 mg/dL (ref 0.57–1.00)
Chloride, Ser: 103 mmol/L (ref 96–106)
GFR, EST AFRICAN AMERICAN: 112 mL/min/{1.73_m2} (ref >59)
GFR, EST NON AFRICAN AMERICAN: 97 mL/min/{1.73_m2} (ref >59)
GLOBULIN, TOTAL: 3.8 g/dL (ref 1.5–4.5)
GLUCOSE: 105 mg/dL — AB (ref 65–99)
Potassium, Ser: 3.8 mmol/L (ref 3.5–5.2)
SODIUM: 136 mmol/L (ref 134–144)
TOTAL PROTEIN: 7.4 g/dL (ref 6.0–8.5)

## 2016-10-10 MED ORDER — IOPAMIDOL (ISOVUE-370) INJECTION 76%
100.0000 mL | Freq: Once | INTRAVENOUS | Status: AC | PRN
  Administered 2016-10-10: 100 mL via INTRAVENOUS

## 2016-10-10 NOTE — Progress Notes (Signed)
Hematology and Oncology Follow Up Visit  JULI ODOM [id number] 1961-02-12 56 y.o. 10/10/2016   Principle Diagnosis:  Idiopathic right lower lobe pulmonary embolism Ulcerative colitis  Current Therapy:   ELIQUIS 5 mg by mouth twice a day   Interim History:  Ms. Dubach is here today with her husband with c/o SOB. We got another CT angio which was negative for PE. She verbalized that she is taking her Eliquis 5 mg PO BID. D-dimer earlier this month was 0.23. Today's level is pending.  She has had no episodes of bleeding, bruising or petechiae.  She still has the same generalized aches and pains since starting the Eliquis but prefers to remain on the medication and not switch to another anticoagulant.  She sits [place] style most of the time and states that her right inner thigh will hurt at times especially after sitting in the new pews at her church.   We discussed in detail the s/s of DVT and PE to be watchful for. She verbalized understanding.  She has chronic puffiness in her feet and ankles that comes and goes and improves when elevating her feet. No redness or pitting edema in her extremities. No numbness or tingling in her extremities.  No lymphadenopathy found on exam.  She has maintained a good appetite and is staying well hydrated. Her weight is stable.   ECOG Performance Status: 2 - Symptomatic, <50% confined to bed  Medications:  Allergies as of 10/10/2016      Reactions   Doxycycline Palpitations   Hydrocodone Other (See Comments)   Hallucinations    Sudafed [pseudoephedrine Hcl] Shortness Of Breath   Acular [ketorolac Tromethamine]    Unknown reaction   Diphenhydramine Hcl Swelling   Throat feels like it swells    Lactose Intolerance (gi) Diarrhea, Other (See Comments)   bloating   Mesalamine Other (See Comments)   Hair loss   Sulfasalazine Diarrhea   Azithromycin Palpitations   Speeding heart rate per pt.   Caffeine Palpitations   Penicillins Rash   Has  patient had a PCN reaction causing immediate rash, facial/tongue/throat swelling, SOB or lightheadedness with hypotension: Yes Has patient had a PCN reaction causing severe rash involving mucus membranes or skin necrosis: No Has patient had a PCN reaction that required hospitalization Yes Has patient had a PCN reaction occurring within the last 10 years: No If all of the above answers are "NO", then may proceed with Cephalosporin use.   Prednisone Palpitations      Medication List       Accurate as of 10/10/16  4:38 PM. Always use your most recent med list.          ALPRAZolam 1 MG tablet Commonly known as:  XANAX TAKE 1/2 TO 1 TABLET BY MOUTH 2 TIMES A DAY AS NEEDED FOR ANXIETY   atenolol 50 MG tablet Commonly known as:  TENORMIN Take 0.5 tablets (25 mg total) by mouth 2 (two) times daily.   ELIQUIS 5 MG Tabs tablet Generic drug:  apixaban TAKE 1 TABLET BY MOUTH TWICE DAILY   folic acid 191 MCG tablet Commonly known as:  FOLVITE Take 800 mcg by mouth daily.   GAS-X PO Take 1 tablet by mouth 4 (four) times daily as needed (For gas.). Do not exceed 6 tablets in a 24 hour period.   IMODIUM A-D 2 MG tablet Generic drug:  loperamide Take 2 mg by mouth 4 (four) times daily as needed for diarrhea or loose stools.  PROBIOTIC DAILY PO Take 2 capsules by mouth daily with lunch.   SYSTANE OP Place 1 drop into both eyes daily as needed (dry eyes).   vitamin B-12 500 MCG tablet Commonly known as:  CYANOCOBALAMIN Take 500 mcg by mouth daily with lunch.   Vitamin D3 2000 units capsule Take 4,000 Units by mouth daily with lunch.       Allergies:  Allergies  Allergen Reactions  . Doxycycline Palpitations  . Hydrocodone Other (See Comments)    Hallucinations   . Sudafed [Pseudoephedrine Hcl] Shortness Of Breath  . Acular [Ketorolac Tromethamine]     Unknown reaction   . Diphenhydramine Hcl Swelling    Throat feels like it swells   . Lactose Intolerance (Gi) Diarrhea  and Other (See Comments)    bloating  . Mesalamine Other (See Comments)    Hair loss  . Sulfasalazine Diarrhea  . Azithromycin Palpitations    Speeding heart rate per pt.   . Caffeine Palpitations  . Penicillins Rash    Has patient had a PCN reaction causing immediate rash, facial/tongue/throat swelling, SOB or lightheadedness with hypotension: Yes Has patient had a PCN reaction causing severe rash involving mucus membranes or skin necrosis: No Has patient had a PCN reaction that required hospitalization Yes Has patient had a PCN reaction occurring within the last 10 years: No If all of the above answers are "NO", then may proceed with Cephalosporin use.  . Prednisone Palpitations    Past Medical History, Surgical history, Social history, and Family History were reviewed and updated.  Review of Systems: All other 10 point review of systems is negative.   Physical Exam:  weight is 312 lb (141.5 kg) (abnormal). Her oral temperature is 98 F (36.7 C). Her blood pressure is 103/54 (abnormal) and her pulse is 64. Her respiration is 18 and oxygen saturation is 100%.   Wt Readings from Last 3 Encounters:  10/10/16 (!) 312 lb (141.5 kg)  09/30/16 (!) 313 lb 1.9 oz (142 kg)  09/09/16 (!) 315 lb 12.8 oz (143.2 kg)    Ocular: Sclerae unicteric, pupils equal, round and reactive to light Ear-nose-throat: Oropharynx clear, dentition fair Lymphatic: No cervical, supraclavicular or axillary adenopathy Lungs no rales or rhonchi, good excursion bilaterally Heart regular rate and rhythm, no murmur appreciated Abd soft, nontender, positive bowel sounds, no liver or spleen tip palpated on exam, no liver or spleen tip palpated on exam, no fluid wave  MSK no focal spinal tenderness, no joint edema Neuro: non-focal, well-oriented, appropriate affect Breasts: Deferred   Lab Results  Component Value Date   WBC 7.7 10/10/2016   HGB 12.9 10/10/2016   HCT 40.7 10/10/2016   MCV 95 10/10/2016   PLT  233 10/10/2016   Lab Results  Component Value Date   IRON 72 07/17/2013   IRONPCTSAT 24.5 07/17/2013   Lab Results  Component Value Date   RBC 4.28 10/10/2016   No results found for: KPAFRELGTCHN, LAMBDASER, KAPLAMBRATIO Lab Results  Component Value Date   IGA 341 05/25/2015   No results found for: Odetta Pink, SPEI   Chemistry      Component Value Date/Time   NA 137 09/09/2016 1332   K 4.2 09/09/2016 1332   CL 105 09/09/2016 1332   CO2 26 09/09/2016 1332   BUN 7 09/09/2016 1332   CREATININE 0.8 09/09/2016 1332      Component Value Date/Time   CALCIUM 9.3 09/09/2016 1332   ALKPHOS 76  09/09/2016 1332   AST 17 09/09/2016 1332   ALT 21 09/09/2016 1332   BILITOT 0.40 09/09/2016 1332      Impression and Plan: Ms. Crisp is a pleasant 56 yo caucasian female with history of PE. She is here today with c/o SOB. CT angio showed no evidence of PE. I spoke with Dr. Marin Olp and she will continue on her same dose of Eliquis.  We will plan to see her back in 3 months for repeat lab work and follow-up. At that time we will look at decreasing her dose of Eliquis to 2.5 mg PO BID.   She plans to get compression stockings 20-30 mmHg pressure to wear daily.  She will be going to the beach with her family and verbalized that she will stay well hydrated. She will also stop and get out of the car to walk every 1.5-2 hours.  She will contact our office with any questions or concerns. We can certainly see her sooner if need be.   Eliezer Bottom, NP 6/4/20184:38 PM

## 2016-10-11 ENCOUNTER — Encounter: Payer: Self-pay | Admitting: *Deleted

## 2016-10-11 LAB — D-DIMER, QUANTITATIVE: D-DIMER: 0.32 mg/L FEU (ref 0.00–0.49)

## 2016-10-14 ENCOUNTER — Encounter: Payer: Self-pay | Admitting: Family

## 2016-10-17 ENCOUNTER — Encounter: Payer: Self-pay | Admitting: Family

## 2016-10-24 ENCOUNTER — Other Ambulatory Visit: Payer: Self-pay | Admitting: Family

## 2016-10-24 ENCOUNTER — Other Ambulatory Visit: Payer: Self-pay | Admitting: Internal Medicine

## 2016-10-24 NOTE — Telephone Encounter (Signed)
Routing to greg, please advise, thanks 

## 2016-10-28 ENCOUNTER — Other Ambulatory Visit: Payer: 59

## 2016-10-28 ENCOUNTER — Ambulatory Visit: Payer: 59 | Admitting: Hematology & Oncology

## 2016-11-07 ENCOUNTER — Encounter: Payer: Self-pay | Admitting: Internal Medicine

## 2016-11-15 ENCOUNTER — Other Ambulatory Visit: Payer: 59

## 2016-11-16 ENCOUNTER — Other Ambulatory Visit: Payer: Self-pay | Admitting: *Deleted

## 2016-11-16 DIAGNOSIS — I2510 Atherosclerotic heart disease of native coronary artery without angina pectoris: Secondary | ICD-10-CM

## 2016-11-16 DIAGNOSIS — I2584 Coronary atherosclerosis due to calcified coronary lesion: Principal | ICD-10-CM

## 2016-11-21 ENCOUNTER — Other Ambulatory Visit: Payer: 59 | Admitting: *Deleted

## 2016-11-21 DIAGNOSIS — I2584 Coronary atherosclerosis due to calcified coronary lesion: Principal | ICD-10-CM

## 2016-11-21 DIAGNOSIS — I2510 Atherosclerotic heart disease of native coronary artery without angina pectoris: Secondary | ICD-10-CM

## 2016-11-22 ENCOUNTER — Encounter: Payer: Self-pay | Admitting: Hematology & Oncology

## 2016-11-22 LAB — SPECIMEN STATUS

## 2016-11-23 ENCOUNTER — Telehealth: Payer: Self-pay | Admitting: *Deleted

## 2016-11-23 NOTE — Telephone Encounter (Signed)
-----   Message from Nelva Bush, MD sent at 11/23/2016  8:29 AM EDT ----- Please let [name] know that her BMP does not show any significant abnormalities. It is okay to proceed with the previously discussed cardiac CT. We will follow-up after the scan.

## 2016-11-23 NOTE — Telephone Encounter (Signed)
Patient informed. 

## 2016-11-25 ENCOUNTER — Ambulatory Visit (HOSPITAL_COMMUNITY)
Admission: RE | Admit: 2016-11-25 | Discharge: 2016-11-25 | Disposition: A | Discharge status: Home / Self Care | Payer: 59 | Source: Ambulatory Visit | Attending: Internal Medicine | Admitting: Internal Medicine

## 2016-11-25 DIAGNOSIS — R079 Chest pain, unspecified: Secondary | ICD-10-CM | POA: Diagnosis not present

## 2016-11-25 DIAGNOSIS — R0602 Shortness of breath: Secondary | ICD-10-CM

## 2016-11-25 DIAGNOSIS — I2510 Atherosclerotic heart disease of native coronary artery without angina pectoris: Secondary | ICD-10-CM | POA: Diagnosis not present

## 2016-11-25 MED ORDER — NITROGLYCERIN 0.4 MG SL SUBL
SUBLINGUAL_TABLET | SUBLINGUAL | Status: AC
  Filled 2016-11-25: qty 1

## 2016-11-25 MED ORDER — IOPAMIDOL (ISOVUE-370) INJECTION 76%
INTRAVENOUS | Status: AC
  Administered 2016-11-25: 80 mL via INTRAVENOUS
  Filled 2016-11-25: qty 100

## 2016-11-25 MED ORDER — NITROGLYCERIN 0.4 MG SL SUBL
0.8000 mg | SUBLINGUAL_TABLET | Freq: Once | SUBLINGUAL | Status: AC
  Administered 2016-11-25: 0.8 mg via SUBLINGUAL
  Filled 2016-11-25: qty 25

## 2016-11-25 MED ORDER — METOPROLOL TARTRATE 5 MG/5ML IV SOLN
INTRAVENOUS | Status: AC
Start: 2016-11-25 — End: 2016-11-25
  Administered 2016-11-25: 09:00:00
  Filled 2016-11-25: qty 5

## 2016-11-25 MED ORDER — IOPAMIDOL (ISOVUE-370) INJECTION 76%
INTRAVENOUS | Status: AC
  Filled 2016-11-25: qty 100

## 2016-11-25 MED ORDER — METOPROLOL TARTRATE 5 MG/5ML IV SOLN
5.0000 mg | Freq: Once | INTRAVENOUS | Status: DC
  Filled 2016-11-25: qty 5

## 2016-11-25 NOTE — Progress Notes (Signed)
CT heart test complete, IV removed- pt on blood thinners, pressure held 5 min and hemostasis obtained. Snack given, no c/o's. VS stable and returned to baseline, pt D/C home with husband

## 2016-11-28 ENCOUNTER — Encounter: Payer: Self-pay | Admitting: Internal Medicine

## 2016-11-29 ENCOUNTER — Telehealth: Payer: Self-pay | Admitting: Internal Medicine

## 2016-11-29 ENCOUNTER — Other Ambulatory Visit: Payer: Self-pay | Admitting: Family

## 2016-11-29 NOTE — Telephone Encounter (Signed)
I spoke with Ms. Boxx re: results of coronary CTA demonstrating mild atherosclerotic CAD and mid LAD bridge with borderline FFR. Symptoms are stable. We will f/u on 12/09/16 as planned.  Nelva Bush, MD [facility] Pager: [phone]

## 2016-12-02 ENCOUNTER — Ambulatory Visit (HOSPITAL_COMMUNITY): Payer: 59

## 2016-12-09 ENCOUNTER — Encounter: Payer: Self-pay | Admitting: Internal Medicine

## 2016-12-09 ENCOUNTER — Telehealth: Payer: Self-pay | Admitting: *Deleted

## 2016-12-09 ENCOUNTER — Ambulatory Visit (INDEPENDENT_AMBULATORY_CARE_PROVIDER_SITE_OTHER): Payer: 59 | Admitting: Internal Medicine

## 2016-12-09 VITALS — BP 130/70 | HR 70 | Ht 65.0 in | Wt 310.0 lb

## 2016-12-09 DIAGNOSIS — I2510 Atherosclerotic heart disease of native coronary artery without angina pectoris: Secondary | ICD-10-CM | POA: Diagnosis not present

## 2016-12-09 DIAGNOSIS — R0602 Shortness of breath: Secondary | ICD-10-CM | POA: Diagnosis not present

## 2016-12-09 DIAGNOSIS — Q245 Malformation of coronary vessels: Secondary | ICD-10-CM

## 2016-12-09 DIAGNOSIS — G4733 Obstructive sleep apnea (adult) (pediatric): Secondary | ICD-10-CM

## 2016-12-09 DIAGNOSIS — Z86711 Personal history of pulmonary embolism: Secondary | ICD-10-CM | POA: Diagnosis not present

## 2016-12-09 DIAGNOSIS — R5383 Other fatigue: Secondary | ICD-10-CM

## 2016-12-09 NOTE — Progress Notes (Signed)
Follow-up Outpatient Visit Date: 12/09/2016  Primary Care Provider: Hoyt Koch, MD [place] [id number]  Chief Complaint: Follow-up shortness of breath and abnormal coronary CTA  HPI:  Cassie [name] is a 56 y.o. year-old female with history of palpitations and PVCs, pulmonary embolism on apixaban, ulcerative colitis, anxiety, vitamin B12 deficiency, vitamin D deficiency, and morbid obesity, who presents for follow-up of chronic shortness of breath and recent abnormal coronary CTA. I last saw the patient on 09/30/16. She presented for a second opinion regarding shortness of breath that have been present for the preceding 6 months. Previous cardiac workup had been negative. We agreed to perform a coronary CTA. This showed mild atherosclerosis involving the LAD. The mid LAD intramyocardial bridge was identified. CT FFR was borderline positive in the distal LAD at 0.8.  Today, Cassie [name] reports feeling about the same as at our last visit. She continues to have shortness of breath with light activity. This sometimes occurs after she has eaten also. She has not had any chest pain. She has occasional skipped beats that are consistent with her known PVCs. Cassie [name] notes intermittent ankle swelling, though this seems to have improved over the last week or 2. She is not wearing compression stockings regularly, noting that they have not helped much in the past. She has never undergone evaluation for sleep apnea.  --------------------------------------------------------------------------------------------------  Cardiovascular History & Procedures: Cardiovascular Problems:  Dyspnea on exertion  Palpitations  Pulmonary embolism  Risk Factors:  Coronary artery calcium on CT, obesity, and sedentary lifestyle  Cath/PCI:  None  CV Surgery:  None  EP Procedures and Devices:  Multiple monitors showing PVCs.b  Non-Invasive Evaluation(s):  Cardiac CTA  (11/25/16): Normal aorta. Trileaflet aortic valve without calcification. Right dominant coronary arteries with normal origins. LMCA is normal. LAD has mild calcified plaque in the proximal segment (0-25%). Mid LAD has a 12 mm intramyocardial bridge. CT FFR of distal LAD 0.8. LCx is nondominant and normal. RCA is large with minimal calcification and no significant stenosis. Coronary artery calcium score 54.  CTA chest (09/09/16): No PE. Coronary artery calcification involving the LAD is noted. No significant lung abnormalities.  TTE (07/04/16): Nromal LV size and contraction (LVEF 55-60%) with normal diastolic function. Normal RV size and function. No significant valvular abnormalities.  Pharmacologic MPI (01/01/04): Abnormal study without ischemia or scar. No significant EKG abnormalities. Normal LV size and function.  Recent CV Pertinent Labs: Lab Results  Component Value Date   CHOL 175 12/25/2015   HDL 40.30 12/25/2015   LDLCALC 114 (H) 12/25/2015   TRIG 103.0 12/25/2015   CHOLHDL 4 12/25/2015   BNP 54.6 07/04/2016   K 3.8 11/21/2016   K 3.8 10/10/2016   K 4.2 09/09/2016   MG 2.1 02/07/2011   BUN 11 11/21/2016   BUN 7 09/09/2016   CREATININE 0.64 11/21/2016   CREATININE 0.68 10/10/2016   CREATININE 0.8 09/09/2016    Past medical and surgical history were reviewed and updated in EPIC.  Current Meds  Medication Sig  . ALPRAZolam (XANAX) 1 MG tablet TAKE 1/2 TO 1 TABLET TWICE A DAY AS NEEDED FOR ANXIETY  . atenolol (TENORMIN) 50 MG tablet Take 0.5 tablets (25 mg total) by mouth 2 (two) times daily.  . Cholecalciferol (VITAMIN D3) 2000 units capsule Take 4,000 Units by mouth daily with lunch.  Marland Kitchen ELIQUIS 5 MG TABS tablet TAKE 1 TABLET BY MOUTH TWICE DAILY  . folic acid (FOLVITE) 045 MCG tablet Take  800 mcg by mouth daily.  Marland Kitchen loperamide (IMODIUM A-D) 2 MG tablet Take 2 mg by mouth 4 (four) times daily as needed for diarrhea or loose stools.  Vladimir Faster Glycol-Propyl Glycol (SYSTANE OP)  Place 1 drop into both eyes daily as needed (dry eyes).  . Probiotic Product (PROBIOTIC DAILY PO) Take 2 capsules by mouth daily with lunch.   . Simethicone (GAS-X PO) Take 1 tablet by mouth 4 (four) times daily as needed (For gas.). Do not exceed 6 tablets in a 24 hour period.  . vitamin B-12 (CYANOCOBALAMIN) 500 MCG tablet Take 500 mcg by mouth daily with lunch.    Allergies: Doxycycline; Hydrocodone; Sudafed [pseudoephedrine hcl]; Acular [ketorolac tromethamine]; Diphenhydramine hcl; Lactose intolerance (gi); Mesalamine; Sulfasalazine; Azithromycin; Caffeine; Penicillins; and Prednisone  Social History   Social History  . Marital status: Married    Spouse name: N/A  . Number of children: 0  . Years of education: N/A   Occupational History  . Housewife Unemployed   Social History Main Topics  . Smoking status: Former Smoker    Types: Cigarettes    Quit date: 03/07/1979  . Smokeless tobacco: Never Used  . Alcohol use No  . Drug use: No  . Sexual activity: Yes    Partners: Male   Other Topics Concern  . Not on file   Social History Narrative   Married: 0 kids   Regular exercise: walks 2 times a week   Caffeine use: none    Family History  Problem Relation Age of Onset  . Heart disease Father 56       pacemaker  . Atrial fibrillation Father   . Hypertension Maternal Aunt   . Hypertension Maternal Grandfather   . Colon cancer Neg Hx   . Stomach cancer Neg Hx   . Colon polyps Neg Hx   . Esophageal cancer Neg Hx   . Rectal cancer Neg Hx     Review of Systems: A 12-system review of systems was performed and was negative except as noted in the HPI.  --------------------------------------------------------------------------------------------------  Physical Exam: BP 130/70   Pulse 70   Ht 5' 5"  (1.651 m)   Wt (!) 310 lb (140.6 kg)   SpO2 98%   BMI 51.59 kg/m   General:  Morbidly obese woman, seated comfortably in the exam room. She is accompanied by her  husband. HEENT: No conjunctival pallor or scleral icterus. Moist mucous membranes.  OP clear. Neck: Supple without lymphadenopathy or thyromegaly. No gross JVD or HJR, though evaluation is limited by body habitus. Lungs: Normal work of breathing. Clear to auscultation bilaterally without wheezes or crackles. Heart: Regular rate and rhythm without murmurs, rubs, or gallops. Unable to assess PMI due to body habitus. Abd: Bowel sounds present. Soft, NT/ND. Unable to assess hepatosplenomegaly due to body habitus. Ext: Trace pretibial edema bilaterally. Radial, PT, and DP pulses are 2+ bilaterally. Skin: Warm and dry without rash.  Lab Results  Component Value Date   WBC WILL FOLLOW 11/21/2016   HGB WILL FOLLOW 11/21/2016   HCT WILL FOLLOW 11/21/2016   MCV WILL FOLLOW 11/21/2016   PLT WILL FOLLOW 11/21/2016    Lab Results  Component Value Date   NA 145 (H) 11/21/2016   K 3.8 11/21/2016   CL 105 11/21/2016   CO2 23 11/21/2016   BUN 11 11/21/2016   CREATININE 0.64 11/21/2016   GLUCOSE 99 11/21/2016   ALT 14 10/10/2016    Lab Results  Component Value Date  CHOL 175 12/25/2015   HDL 40.30 12/25/2015   LDLCALC 114 (H) 12/25/2015   TRIG 103.0 12/25/2015   CHOLHDL 4 12/25/2015    --------------------------------------------------------------------------------------------------  ASSESSMENT AND PLAN: Dyspnea on exertion and fatigue These have been chronic problems for Cassie [name] over the last 6+ months. I suspect they are likely multifactorial and driven predominantly by her deconditioning and morbid obesity. Her symptoms became more pronounced after she gained about 70 pounds. Coronary CTA last month was notable for mid LAD intramyocardial bridge with borderline positive FFR in the distal LAD. I doubt that this alone is responsible for her symptoms, particularly given lack of chest pain. I have encouraged Cassie [name] to lose weight through diet and exercise. I will also refer her  for a sleep study, as underlying sleep apnea could be contributing to some of her symptoms.  Nonobstructive coronary artery disease with a myocardial bridge Mild, nonobstructive CAD noted in the proximal LAD. Intramyocardial bridge also noted in the mid LAD. I doubt that these findings are contributing to Cassie [name]'s symptoms. We discussed escalation of atenolol to optimize beta blockade, as elevated heart rates may accentuate the effects of the intramyocardial bridge. She notes some low heart rates in the past when on higher doses of atenolol. We will not make any medication changes today. We discussed adding a statin as well to prevent progression of atherosclerotic coronary artery disease. Cassie [name] would like to defer this at the current time out of concern for potential side effects from this medication.  History of pulmonary embolism Cassie [name] remains on apixaban. I will defer further management to Dr. Marin Olp.  Follow-up: Return to clinic in 3 months.  Nelva Bush, MD 12/09/2016 9:02 AM

## 2016-12-09 NOTE — Telephone Encounter (Signed)
Dr End has recommended patient for a split night study and the patient has expressed that she is claustrophobic and she is up and down most of the night with back pain getting only about 3-4 hours of sleep. She is concerned that there might night not be adequate time to get good results.  To Dr End for advice

## 2016-12-09 NOTE — Patient Instructions (Signed)
Medication Instructions:  Your physician recommends that you continue on your current medications as directed. Please refer to the Current Medication list given to you today.   Labwork: None   Testing/Procedures: Your physician has recommended that you have a sleep study. This test records several body functions during sleep, including: brain activity, eye movement, oxygen and carbon dioxide blood levels, heart rate and rhythm, breathing rate and rhythm, the flow of air through your mouth and nose, snoring, body muscle movements, and chest and belly movement.    Follow-Up: Your physician recommends that you schedule a follow-up appointment in: 3 months with Dr End.   Any Other Special Instructions Will Be Listed Below (If Applicable).  Dr End recommends that you exercise regularly, walking 30 minutes per day most days of the week. Dr End recommends that you lose weight.  Heart-Healthy Eating Plan Heart-healthy meal planning includes:  Limiting unhealthy fats.  Increasing healthy fats.  Making other small dietary changes.  You may need to talk with your doctor or a diet specialist (dietitian) to create an eating plan that is right for you. What types of fat should I choose?  Choose healthy fats. These include olive oil and canola oil, flaxseeds, walnuts, almonds, and seeds.  Eat more omega-3 fats. These include salmon, mackerel, sardines, tuna, flaxseed oil, and ground flaxseeds. Try to eat fish at least twice each week.  Limit saturated fats. ? Saturated fats are often found in animal products, such as meats, butter, and cream. ? Plant sources of saturated fats include palm oil, palm kernel oil, and coconut oil.  Avoid foods with partially hydrogenated oils in them. These include stick margarine, some tub margarines, cookies, crackers, and other baked goods. These contain trans fats. What general guidelines do I need to follow?  Check food labels carefully. Identify foods  with trans fats or high amounts of saturated fat.  Fill one half of your plate with vegetables and green salads. Eat 4-5 servings of vegetables per day. A serving of vegetables is: ? 1 cup of raw leafy vegetables. ?  cup of raw or cooked cut-up vegetables. ?  cup of vegetable juice.  Fill one fourth of your plate with whole grains. Look for the word "whole" as the first word in the ingredient list.  Fill one fourth of your plate with lean protein foods.  Eat 4-5 servings of fruit per day. A serving of fruit is: ? One medium whole fruit. ?  cup of dried fruit. ?  cup of fresh, frozen, or canned fruit. ?  cup of 100% fruit juice.  Eat more foods that contain soluble fiber. These include apples, broccoli, carrots, beans, peas, and barley. Try to get 20-30 g of fiber per day.  Eat more home-cooked food. Eat less restaurant, buffet, and fast food.  Limit or avoid alcohol.  Limit foods high in starch and sugar.  Avoid fried foods.  Avoid frying your food. Try baking, boiling, grilling, or broiling it instead. You can also reduce fat by: ? Removing the skin from poultry. ? Removing all visible fats from meats. ? Skimming the fat off of stews, soups, and gravies before serving them. ? Steaming vegetables in water or broth.  Lose weight if you are overweight.  Eat 4-5 servings of nuts, legumes, and seeds per week: ? One serving of dried beans or legumes equals  cup after being cooked. ? One serving of nuts equals 1 ounces. ? One serving of seeds equals  ounce or one  tablespoon.  You may need to keep track of how much salt or sodium you eat. This is especially true if you have high blood pressure. Talk with your doctor or dietitian to get more information. What foods can I eat? Grains Breads, including [place], white, pita, wheat, raisin, rye, oatmeal, and [place]. Tortillas that are neither fried nor made with lard or trans fat. Low-fat rolls, including hotdog and hamburger  buns and English muffins. Biscuits. Muffins. Waffles. Pancakes. Light popcorn. Whole-grain cereals. Flatbread. Melba toast. Pretzels. Breadsticks. Rusks. Low-fat snacks. Low-fat crackers, including oyster, saltine, matzo, graham, animal, and rye. Rice and pasta, including brown rice and pastas that are made with whole wheat. Vegetables All vegetables. Fruits All fruits, but limit coconut. Meats and Other Protein Sources Lean, well-trimmed beef, veal, pork, and lamb. Chicken and [place] without skin. All fish and shellfish. Wild duck, rabbit, pheasant, and venison. Egg whites or low-cholesterol egg substitutes. Dried beans, peas, lentils, and tofu. Seeds and most nuts. Dairy Low-fat or nonfat cheeses, including ricotta, string, and mozzarella. Skim or 1% milk that is liquid, powdered, or evaporated. Buttermilk that is made with low-fat milk. Nonfat or low-fat yogurt. Beverages Mineral water. Diet carbonated beverages. Sweets and Desserts Sherbets and fruit ices. Honey, jam, marmalade, jelly, and syrups. Meringues and gelatins. Pure sugar candy, such as hard candy, jelly beans, gumdrops, mints, marshmallows, and small amounts of dark chocolate. [employer] Eat all sweets and desserts in moderation. Fats and Oils Nonhydrogenated (trans-free) margarines. Vegetable oils, including soybean, sesame, sunflower, olive, peanut, safflower, corn, canola, and cottonseed. Salad dressings or mayonnaise made with a vegetable oil. Limit added fats and oils that you use for cooking, baking, salads, and as spreads. Other Cocoa powder. Coffee and tea. All seasonings and condiments. The items listed above may not be a complete list of recommended foods or beverages. Contact your dietitian for more options. What foods are not recommended? Grains Breads that are made with saturated or trans fats, oils, or whole milk. Croissants. Butter rolls. Cheese breads. Sweet rolls. Donuts. Buttered popcorn. Chow mein noodles.  High-fat crackers, such as cheese or butter crackers. Meats and Other Protein Sources Fatty meats, such as hotdogs, short ribs, sausage, spareribs, bacon, rib eye roast or steak, and mutton. High-fat deli meats, such as salami and bologna. Caviar. Domestic duck and goose. Organ meats, such as kidney, liver, sweetbreads, and heart. Dairy Cream, sour cream, cream cheese, and creamed cottage cheese. Whole-milk cheeses, including blue (bleu), Monte[place] Jack, [place], [place], American, [place], Swiss, cheddar, [place], and [place]. Whole or 2% milk that is liquid, evaporated, or condensed. Whole buttermilk. Cream sauce or high-fat cheese sauce. Yogurt that is made from whole milk. Beverages Regular sodas and juice drinks with added sugar. Sweets and Desserts Frosting. Pudding. Cookies. Cakes other than angel food cake. Candy that has milk chocolate or white chocolate, hydrogenated fat, butter, coconut, or unknown ingredients. Buttered syrups. Full-fat ice cream or ice cream drinks. Fats and Oils Gravy that has suet, meat fat, or shortening. Cocoa butter, hydrogenated oils, palm oil, coconut oil, palm kernel oil. These can often be found in baked products, candy, fried foods, nondairy creamers, and whipped toppings. Solid fats and shortenings, including bacon fat, salt pork, lard, and butter. Nondairy cream substitutes, such as coffee creamers and sour cream substitutes. Salad dressings that are made of unknown oils, cheese, or sour cream. The items listed above may not be a complete list of foods and beverages to avoid. Contact your dietitian for more information. This  information is not intended to replace advice given to you by your health care provider. Make sure you discuss any questions you have with your health care provider. Document Released: 10/25/2011 Document Revised: 10/01/2015 Document Reviewed: 10/17/2013 Elsevier Interactive Patient Education  [employer].    If you need a refill on  your cardiac medications before your next appointment, please call your pharmacy.

## 2016-12-10 ENCOUNTER — Encounter: Payer: Self-pay | Admitting: Internal Medicine

## 2016-12-10 DIAGNOSIS — I2510 Atherosclerotic heart disease of native coronary artery without angina pectoris: Secondary | ICD-10-CM | POA: Insufficient documentation

## 2016-12-10 DIAGNOSIS — Z86711 Personal history of pulmonary embolism: Secondary | ICD-10-CM | POA: Insufficient documentation

## 2016-12-10 DIAGNOSIS — R5383 Other fatigue: Secondary | ICD-10-CM | POA: Insufficient documentation

## 2016-12-10 DIAGNOSIS — I2584 Coronary atherosclerosis due to calcified coronary lesion: Secondary | ICD-10-CM | POA: Insufficient documentation

## 2016-12-10 DIAGNOSIS — Q245 Malformation of coronary vessels: Secondary | ICD-10-CM | POA: Insufficient documentation

## 2016-12-11 NOTE — Telephone Encounter (Signed)
I will defer this to Dr. Radford Pax. I am fine with whatever she recommends.  Nelva Bush, MD [facility] Pager: [phone]

## 2016-12-12 ENCOUNTER — Encounter: Payer: Self-pay | Admitting: Internal Medicine

## 2016-12-12 NOTE — Telephone Encounter (Signed)
Per Dr Radford Pax / Dr End a nocturnal polysomnography (PSG) has been ordered for scheduling for patient.

## 2016-12-12 NOTE — Telephone Encounter (Signed)
Patient called stating she would like to wait until she sees Dr End on November 5 before she agrees to a sleep study because she does not think she needs the sleep study. Patient said she has contacted Dr End through her my chart. Patient will contact our office back when she is ready to move forward.

## 2016-12-12 NOTE — Telephone Encounter (Signed)
Lets go ahead and try a full night PSG

## 2016-12-14 ENCOUNTER — Encounter: Payer: Self-pay | Admitting: Hematology & Oncology

## 2016-12-19 ENCOUNTER — Encounter: Payer: Self-pay | Admitting: Internal Medicine

## 2016-12-19 ENCOUNTER — Ambulatory Visit: Payer: 59 | Admitting: Internal Medicine

## 2016-12-20 NOTE — Telephone Encounter (Signed)
Please advise in Dr. Charlynne Cousins absence. I can fill out application if you are okay with me starting it. thanks

## 2016-12-29 ENCOUNTER — Other Ambulatory Visit: Payer: Self-pay | Admitting: Family

## 2017-01-05 ENCOUNTER — Other Ambulatory Visit: Payer: Self-pay

## 2017-01-05 ENCOUNTER — Encounter: Payer: Self-pay | Admitting: Hematology & Oncology

## 2017-01-05 ENCOUNTER — Emergency Department (HOSPITAL_COMMUNITY)
Admission: EM | Admit: 2017-01-05 | Discharge: 2017-01-05 | Disposition: A | Discharge status: Home / Self Care | Payer: 59 | Attending: Emergency Medicine | Admitting: Emergency Medicine

## 2017-01-05 ENCOUNTER — Ambulatory Visit (HOSPITAL_BASED_OUTPATIENT_CLINIC_OR_DEPARTMENT_OTHER)
Admission: RE | Admit: 2017-01-05 | Discharge: 2017-01-05 | Disposition: A | Discharge status: Home / Self Care | Payer: 59 | Source: Ambulatory Visit | Attending: Hematology & Oncology | Admitting: Hematology & Oncology

## 2017-01-05 ENCOUNTER — Other Ambulatory Visit: Payer: Self-pay | Admitting: Hematology & Oncology

## 2017-01-05 ENCOUNTER — Encounter (HOSPITAL_COMMUNITY): Payer: Self-pay | Admitting: Emergency Medicine

## 2017-01-05 ENCOUNTER — Emergency Department (HOSPITAL_COMMUNITY): Payer: 59

## 2017-01-05 DIAGNOSIS — I82401 Acute embolism and thrombosis of unspecified deep veins of right lower extremity: Secondary | ICD-10-CM

## 2017-01-05 DIAGNOSIS — R079 Chest pain, unspecified: Secondary | ICD-10-CM | POA: Insufficient documentation

## 2017-01-05 DIAGNOSIS — M79604 Pain in right leg: Secondary | ICD-10-CM | POA: Diagnosis not present

## 2017-01-05 DIAGNOSIS — Z5321 Procedure and treatment not carried out due to patient leaving prior to being seen by health care provider: Secondary | ICD-10-CM | POA: Diagnosis not present

## 2017-01-05 DIAGNOSIS — Z7901 Long term (current) use of anticoagulants: Secondary | ICD-10-CM | POA: Diagnosis not present

## 2017-01-05 LAB — BASIC METABOLIC PANEL
Anion gap: 7 (ref 5–15)
BUN: 7 mg/dL (ref 6–20)
CALCIUM: 9.4 mg/dL (ref 8.9–10.3)
CHLORIDE: 105 mmol/L (ref 101–111)
CO2: 28 mmol/L (ref 22–32)
CREATININE: 0.83 mg/dL (ref 0.44–1.00)
GFR calc Af Amer: >60 mL/min (ref >60)
Glucose, Bld: 118 mg/dL — ABNORMAL HIGH (ref 65–99)
POTASSIUM: 4.1 mmol/L (ref 3.5–5.1)
Sodium: 140 mmol/L (ref 135–145)

## 2017-01-05 LAB — CBC
HCT: 40.2 % (ref 36.0–46.0)
Hemoglobin: 12.3 g/dL (ref 12.0–15.0)
MCH: 28.5 pg (ref 26.0–34.0)
MCHC: 30.6 g/dL (ref 30.0–36.0)
MCV: 93.3 fL (ref 78.0–100.0)
PLATELETS: 269 10*3/uL (ref 150–400)
RBC: 4.31 MIL/uL (ref 3.87–5.11)
RDW: 15.2 % (ref 11.5–15.5)
WBC: 8.1 10*3/uL (ref 4.0–10.5)

## 2017-01-05 LAB — D-DIMER, QUANTITATIVE (NOT AT ARMC)

## 2017-01-05 LAB — I-STAT TROPONIN, ED: TROPONIN I, POC: 0 ng/mL (ref 0.00–0.08)

## 2017-01-05 LAB — BRAIN NATRIURETIC PEPTIDE: B NATRIURETIC PEPTIDE 5: 54.4 pg/mL (ref 0.0–100.0)

## 2017-01-05 NOTE — ED Triage Notes (Signed)
Pt states that she is chronic SOB since last December, she is having intermittent left cp that she described as soreness. Pt states she had a doppler done today for right leg pain and swollen. Pt states she is having same symptom as when she had a PE on February. Pt is on Eliquis.

## 2017-01-06 ENCOUNTER — Encounter: Payer: Self-pay | Admitting: Hematology & Oncology

## 2017-01-06 ENCOUNTER — Encounter: Payer: Self-pay | Admitting: Internal Medicine

## 2017-01-11 ENCOUNTER — Other Ambulatory Visit: Payer: 59

## 2017-01-11 ENCOUNTER — Ambulatory Visit: Payer: 59 | Admitting: Family

## 2017-01-11 ENCOUNTER — Encounter: Payer: Self-pay | Admitting: Internal Medicine

## 2017-01-12 ENCOUNTER — Encounter: Payer: Self-pay | Admitting: Internal Medicine

## 2017-01-13 ENCOUNTER — Other Ambulatory Visit: Payer: Self-pay | Admitting: Family

## 2017-01-13 ENCOUNTER — Ambulatory Visit (INDEPENDENT_AMBULATORY_CARE_PROVIDER_SITE_OTHER): Payer: 59 | Admitting: Internal Medicine

## 2017-01-13 ENCOUNTER — Encounter: Payer: Self-pay | Admitting: Internal Medicine

## 2017-01-13 VITALS — BP 132/64 | HR 72 | Ht 65.0 in | Wt 305.0 lb

## 2017-01-13 DIAGNOSIS — Q245 Malformation of coronary vessels: Secondary | ICD-10-CM | POA: Diagnosis not present

## 2017-01-13 DIAGNOSIS — R0789 Other chest pain: Secondary | ICD-10-CM | POA: Diagnosis not present

## 2017-01-13 DIAGNOSIS — R002 Palpitations: Secondary | ICD-10-CM

## 2017-01-13 DIAGNOSIS — R0609 Other forms of dyspnea: Secondary | ICD-10-CM | POA: Diagnosis not present

## 2017-01-13 DIAGNOSIS — R5382 Chronic fatigue, unspecified: Secondary | ICD-10-CM | POA: Diagnosis not present

## 2017-01-13 NOTE — Progress Notes (Addendum)
Follow-up Outpatient Visit Date: 01/13/2017  Primary Care Provider: Hoyt Koch, MD [place] [id number]  Chief Complaint: follow-up chest pain and shortness of breath  HPI:  [name] is a 56 y.o. year-old female with history of palpitations and PVCs, pulmonary embolism on apixaban, ulcerative colitis, anxiety, vitamin B12 deficiency, vitamin D deficiency, and morbid obesity, who presents for follow-up. I last saw her on 12/09/16, at which time she reported stable shortness of breath with mild activity, as well as palpitations. 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 We agreed to defer making medication changes; I encouraged her to exercise and lose weight. She presented to the ED on 01/05/17 due to shortness of breath and leg swelling. She had serial EKGs and labs but left before being assessed by an ED physician.  Today, [name] notes that she is feeling about the same as she did our last visit. Recent ED visit was prompted by "odd feelings" including vague chest discomfort, shortness of breath, and generalized malaise. She felt similar to when she had her pulmonary embolism last fall. Of note, she has been taking apixaban as prescribed. She became concerned when reviewing her EKGs performed in the emergency department, noting that one was normal and the other had multiple abnormalities listed. She states that only one EKG was performed during her time there.  --------------------------------------------------------------------------------------------------  Cardiovascular History & Procedures: Cardiovascular Problems:  Dyspnea on exertion  Palpitations  Pulmonary embolism  Risk Factors:  Coronary artery calcium on CT, obesity, and sedentary lifestyle  Cath/PCI:  None  CV Surgery:  None  EP Procedures and Devices:  Multiple monitors showing PVCs.  Non-Invasive  Evaluation(s):  Cardiac CTA (11/25/16): Normal aorta. Trileaflet aortic valve without calcification. Right dominant coronary arteries with normal origins. LMCA is normal. LAD has mild calcified plaque in the proximal segment (0-25%). Mid LAD has a 12 mm intramyocardial bridge. CT FFR of distal LAD 0.8. LCx is nondominant and normal. RCA is large with minimal calcification and no significant stenosis. Coronary artery calcium score 54.  CTA chest (09/09/16): No PE. Coronary artery calcification involving the LAD is noted. No significant lung abnormalities.  TTE (07/04/16): Nromal LV size and contraction (LVEF 55-60%) with normal diastolic function. Normal RV size and function. No significant valvular abnormalities.  Pharmacologic MPI (01/01/04): Abnormal study without ischemia or scar. No significant EKG abnormalities. Normal LV size and function.  Recent CV Pertinent Labs: Lab Results  Component Value Date   CHOL 175 12/25/2015   HDL 40.30 12/25/2015   LDLCALC 114 (H) 12/25/2015   TRIG 103.0 12/25/2015   CHOLHDL 4 12/25/2015   BNP 54.4 01/05/2017   K 4.1 01/05/2017   K 3.8 10/10/2016   K 4.2 09/09/2016   MG 2.1 02/07/2011   BUN 7 01/05/2017   BUN 11 11/21/2016   BUN 7 09/09/2016   CREATININE 0.83 01/05/2017   CREATININE 0.68 10/10/2016   CREATININE 0.8 09/09/2016    Past medical and surgical history were reviewed and updated in EPIC.  Current Meds  Medication Sig  . ALPRAZolam (XANAX) 1 MG tablet TAKE 1/2 TO 1 TABLET TWICE A DAY AS NEEDED FOR ANXIETY  . atenolol (TENORMIN) 50 MG tablet Take 0.5 tablets (25 mg total) by mouth 2 (two) times daily.  . Cholecalciferol (VITAMIN D3) 2000 units capsule Take 4,000 Units by mouth daily with lunch.  Marland Kitchen ELIQUIS 5 MG TABS tablet TAKE 1 TABLET BY  MOUTH TWICE DAILY  . ELIQUIS 5 MG TABS tablet TAKE 1 TABLET BY MOUTH TWICE DAILY  . folic acid (FOLVITE) 831 MCG tablet Take 800 mcg by mouth daily.  Marland Kitchen loperamide (IMODIUM A-D) 2 MG tablet Take 2 mg by  mouth 4 (four) times daily as needed for diarrhea or loose stools.  Vladimir Faster Glycol-Propyl Glycol (SYSTANE OP) Place 1 drop into both eyes daily as needed (dry eyes).  . Probiotic Product (PROBIOTIC DAILY PO) Take 2 capsules by mouth daily with lunch.   . Simethicone (GAS-X PO) Take 1 tablet by mouth 4 (four) times daily as needed (For gas.). Do not exceed 6 tablets in a 24 hour period.  . vitamin B-12 (CYANOCOBALAMIN) 500 MCG tablet Take 500 mcg by mouth daily with lunch.    Allergies: Doxycycline; Hydrocodone; Sudafed [pseudoephedrine hcl]; Acular [ketorolac tromethamine]; Diphenhydramine hcl; Lactose intolerance (gi); Mesalamine; Sulfasalazine; Azithromycin; Caffeine; Penicillins; and Prednisone  Social History   Social History  . Marital status: Married    Spouse name: N/A  . Number of children: 0  . Years of education: N/A   Occupational History  . Housewife Unemployed   Social History Main Topics  . Smoking status: Former Smoker    Types: Cigarettes    Quit date: 03/07/1979  . Smokeless tobacco: Never Used  . Alcohol use No  . Drug use: No  . Sexual activity: Yes    Partners: Male   Other Topics Concern  . Not on file   Social History Narrative   Married: 0 kids   Regular exercise: walks 2 times a week   Caffeine use: none    Family History  Problem Relation Age of Onset  . Heart disease Father 91       pacemaker  . Atrial fibrillation Father   . Hypertension Maternal Aunt   . Hypertension Maternal Grandfather   . Colon cancer Neg Hx   . Stomach cancer Neg Hx   . Colon polyps Neg Hx   . Esophageal cancer Neg Hx   . Rectal cancer Neg Hx     Review of Systems: Review of Systems  Constitutional: Positive for malaise/fatigue.  HENT: Negative.   Eyes: Negative.   Respiratory: Positive for cough, sputum production and shortness of breath.   Cardiovascular: Positive for chest pain and palpitations (Heart feels as though it is beating faster). Negative for  orthopnea and claudication.  Gastrointestinal: Negative.   Genitourinary: Negative.   Musculoskeletal: Positive for myalgias.  Skin: Negative.   Neurological: Negative.   Endo/Heme/Allergies: Negative.   Psychiatric/Behavioral: Negative.    --------------------------------------------------------------------------------------------------  Physical Exam: BP 132/64   Pulse 72   Ht 5' 5"  (1.651 m)   Wt (!) 305 lb (138.3 kg)   SpO2 98%   BMI 50.75 kg/m   General:  Morbidly obese woman, seated comfortably in the exam room. She is accompanied by her husband. HEENT: No conjunctival pallor or scleral icterus.  Moist mucous membranes.  OP clear. Neck: Supple without lymphadenopathy, thyromegaly. No obvious JVD, though body habitus limits evaluation. Lungs: Normal work of breathing.  Clear to auscultation bilaterally without wheezes or crackles. Heart: Distant heart sounds. Regular rate and rhythm without murmurs, rubs, or gallops.  Unable to assess PMI due to body habitus. Abd: Bowel sounds present.  Soft, NT/ND. Unable to assess HSM due to body habitus. Ext: Trace pretibial edema bilaterally.  Radial, PT, and DP pulses are 2+ bilaterally. Skin: Warm and dry without rash.  EKG: Sinus bradycardia (heart rate  58 bpm) without significant abnormalities.   Lab Results  Component Value Date   WBC 8.1 01/05/2017   HGB 12.3 01/05/2017   HCT 40.2 01/05/2017   MCV 93.3 01/05/2017   PLT 269 01/05/2017    Lab Results  Component Value Date   NA 140 01/05/2017   K 4.1 01/05/2017   CL 105 01/05/2017   CO2 28 01/05/2017   BUN 7 01/05/2017   CREATININE 0.83 01/05/2017   GLUCOSE 118 (H) 01/05/2017   ALT 14 10/10/2016    Lab Results  Component Value Date   CHOL 175 12/25/2015   HDL 40.30 12/25/2015   LDLCALC 114 (H) 12/25/2015   TRIG 103.0 12/25/2015   CHOLHDL 4 12/25/2015     --------------------------------------------------------------------------------------------------  ASSESSMENT AND PLAN: Myocardial bridge with atypical chest pain and dyspnea on exertion Chest pain is new since [name] his last visit but is atypical. Given accompanying cough and generalized malaise, I wonder if a respiratory infection may have caused her symptoms. Overall, she seems to be improving. I do not think that her symptoms are directly attributable to the incidentally noted myocardial bridge involving the mid LAD. Her heart rate is adequately controlled today. I have reassured [name]. We will not make any medication changes at this time. I reinforced continued weight loss and exercise, as her morbid obesity is likely contributing to [name] symptoms. Of note, EKG obtained in the ED on 01/05/17 at 2100 is notably different than baseline tracings. Tracing is also different than today's EKG, which demonstrates no significant abnormalities. I suspect that abnormal EKG from 01/05/17 may not have been from [name], as she reports only having one tracing done during this visit.  Palpitations The Cassie [name] notes that for the last week, her heart rate seems to be elevated, particularly with activity. EKG today demonstrates sinus bradycardia with a heart rate of 50 bpm. We have agreed to perform a 14 day event monitor to evaluate for any significant arrhythmias that may also underlie her aforementioned symptoms.  Fatigue This is likely multifactorial. We readdressed the potential for sleep apnea. [name] does not believe she would be able to perform a sleep study in a traditional setting. We will inquire about the possibility of performing a home sleep study. Even with this, the Cassie [name] is concerned about being able to tolerate CPAP should she be diagnosed with sleep apnea.  Follow-up: Return to clinic as planned in 2 months.  Nelva Bush, MD 01/13/2017 11:43 AM

## 2017-01-13 NOTE — Patient Instructions (Addendum)
Medication Instructions:  Your physician recommends that you continue on your current medications as directed. Please refer to the Current Medication list given to you today.   Labwork: None ordered  Testing/Procedures: Your physician has recommended that you wear a holter monitor. Holter monitors are medical devices that record the heart's electrical activity. Doctors most often use these monitors to diagnose arrhythmias. Arrhythmias are problems with the speed or rhythm of the heartbeat. The monitor is a small, portable device. You can wear one while you do your normal daily activities. This is usually used to diagnose what is causing palpitations/syncope (passing out).  Follow-Up: Keep follow-up appointment with Dr. Saunders Revel on 03/13/17 at 8:00 AM.   Any Other Special Instructions Will Be Listed Below (If Applicable).     If you need a refill on your cardiac medications before your next appointment, please call your pharmacy.

## 2017-01-14 ENCOUNTER — Encounter: Payer: Self-pay | Admitting: Internal Medicine

## 2017-01-16 MED ORDER — ATENOLOL 25 MG PO TABS: 25.0000 mg | ORAL_TABLET | Freq: Two times a day (BID) | ORAL | 1 refills | Status: DC

## 2017-01-16 NOTE — Telephone Encounter (Signed)
Please advise thanks.

## 2017-01-16 NOTE — Telephone Encounter (Signed)
Rx faxed

## 2017-01-17 ENCOUNTER — Other Ambulatory Visit: Payer: Self-pay | Admitting: Family

## 2017-01-18 ENCOUNTER — Ambulatory Visit: Payer: 59 | Admitting: Nurse Practitioner

## 2017-01-19 ENCOUNTER — Ambulatory Visit (INDEPENDENT_AMBULATORY_CARE_PROVIDER_SITE_OTHER): Payer: 59

## 2017-01-19 DIAGNOSIS — R002 Palpitations: Secondary | ICD-10-CM

## 2017-01-23 ENCOUNTER — Encounter: Payer: Self-pay | Admitting: Internal Medicine

## 2017-01-24 ENCOUNTER — Other Ambulatory Visit (HOSPITAL_BASED_OUTPATIENT_CLINIC_OR_DEPARTMENT_OTHER): Payer: 59

## 2017-01-24 ENCOUNTER — Ambulatory Visit (HOSPITAL_BASED_OUTPATIENT_CLINIC_OR_DEPARTMENT_OTHER): Payer: 59 | Admitting: Family

## 2017-01-24 VITALS — BP 134/65 | HR 65 | Temp 98.5°F | Resp 20 | Wt 310.0 lb

## 2017-01-24 DIAGNOSIS — I2699 Other pulmonary embolism without acute cor pulmonale: Secondary | ICD-10-CM

## 2017-01-24 DIAGNOSIS — M25562 Pain in left knee: Secondary | ICD-10-CM | POA: Diagnosis not present

## 2017-01-24 LAB — CBC WITH DIFFERENTIAL (CANCER CENTER ONLY)
BASO#: 0 10*3/uL (ref 0.0–0.2)
BASO%: 0.1 % (ref 0.0–2.0)
EOS%: 2.6 % (ref 0.0–7.0)
Eosinophils Absolute: 0.2 10*3/uL (ref 0.0–0.5)
HEMATOCRIT: 40.9 % (ref 34.8–46.6)
HGB: 13.2 g/dL (ref 11.6–15.9)
LYMPH#: 1.8 10*3/uL (ref 0.9–3.3)
LYMPH%: 20.8 % (ref 14.0–48.0)
MCH: 30.4 pg (ref 26.0–34.0)
MCHC: 32.3 g/dL (ref 32.0–36.0)
MCV: 94 fL (ref 81–101)
MONO#: 0.6 10*3/uL (ref 0.1–0.9)
MONO%: 7.1 % (ref 0.0–13.0)
NEUT#: 5.9 10*3/uL (ref 1.5–6.5)
NEUT%: 69.4 % (ref 39.6–80.0)
Platelets: 255 10*3/uL (ref 145–400)
RBC: 4.34 10*6/uL (ref 3.70–5.32)
RDW: 14.6 % (ref 11.1–15.7)
WBC: 8.5 10*3/uL (ref 3.9–10.0)

## 2017-01-24 LAB — COMPREHENSIVE METABOLIC PANEL
ALT: 16 U/L (ref 0–55)
ANION GAP: 9 meq/L (ref 3–11)
AST: 18 U/L (ref 5–34)
Albumin: 3.2 g/dL — ABNORMAL LOW (ref 3.5–5.0)
Alkaline Phosphatase: 107 U/L (ref 40–150)
BILIRUBIN TOTAL: 0.31 mg/dL (ref 0.20–1.20)
BUN: 9.5 mg/dL (ref 7.0–26.0)
CALCIUM: 9.7 mg/dL (ref 8.4–10.4)
CO2: 24 meq/L (ref 22–29)
Chloride: 105 mEq/L (ref 98–109)
Creatinine: 0.8 mg/dL (ref 0.6–1.1)
EGFR: 79 mL/min/{1.73_m2} — AB (ref >90)
Glucose: 100 mg/dl (ref 70–140)
POTASSIUM: 4 meq/L (ref 3.5–5.1)
Sodium: 139 mEq/L (ref 136–145)
Total Protein: 7.6 g/dL (ref 6.4–8.3)

## 2017-01-24 NOTE — Progress Notes (Signed)
Hematology and Oncology Follow Up Visit  Cassie [name] [id number] 1960-11-23 56 y.o. 01/24/2017   Principle Diagnosis:  Idiopathic right lower lobe pulmonary embolism Ulcerative colitis  Current Therapy:   ELIQUIS 5 mg by mouth twice a day   Interim History:  Cassie [name] is here today with her husband for follow-up. She is doing fairly well but still complains of generalized aches and pains "all over" and in the legs.  She states that she saw Dr. Saunders Revel regarding chest discomfort and palpitations and that her work up so far has been negative.  She had a CT angio in June for SOB which was negative for PE. She also had an [place] in August of the right lower extremity for pain which was negative as well.  She has c/o acid reflux and will try taking OTC Zantac and see if this helps.  No episodes of bleeding, bruising or petechiae. No lymphadenopathy noted on exam.  No fever, chills, n/v, cough, rash, dizziness, abdominal pain or changes in bowel or bladder habits. No numbness or tingling in her extremities at this time. She has puffiness in her lower extremities that comes and goes. No pitting or edema.  She has maintained a good appetite and is staying hydrated. Her weight is stable.   ECOG Performance Status: 1 - Symptomatic but completely ambulatory  Medications:  Allergies as of 01/24/2017      Reactions   Doxycycline Palpitations   Hydrocodone Other (See Comments)   Hallucinations    Sudafed [pseudoephedrine Hcl] Shortness Of Breath   Acular [ketorolac Tromethamine]    Unknown reaction   Diphenhydramine Hcl Swelling   Throat feels like it swells    Lactose Intolerance (gi) Diarrhea, Other (See Comments)   bloating   Mesalamine Other (See Comments)   Hair loss   Sulfasalazine Diarrhea   Azithromycin Palpitations   Speeding heart rate per pt.   Caffeine Palpitations   Penicillins Rash   Has patient had a PCN reaction causing immediate rash, facial/tongue/throat swelling, SOB  or lightheadedness with hypotension: Yes Has patient had a PCN reaction causing severe rash involving mucus membranes or skin necrosis: No Has patient had a PCN reaction that required hospitalization Yes Has patient had a PCN reaction occurring within the last 10 years: No If all of the above answers are "NO", then may proceed with Cephalosporin use.   Prednisone Palpitations      Medication List       Accurate as of 01/24/17 10:02 AM. Always use your most recent med list.          ALPRAZolam 1 MG tablet Commonly known as:  XANAX TAKE 1/2 TO 1 TABLET TWICE DAILY AS NEEDED FOR ANXIETY   atenolol 25 MG tablet Commonly known as:  TENORMIN Take 1 tablet (25 mg total) by mouth 2 (two) times daily.   ELIQUIS 5 MG Tabs tablet Generic drug:  apixaban TAKE 1 TABLET BY MOUTH TWICE DAILY   ELIQUIS 5 MG Tabs tablet Generic drug:  apixaban TAKE 1 TABLET BY MOUTH TWICE DAILY   folic acid 921 MCG tablet Commonly known as:  FOLVITE Take 800 mcg by mouth daily.   GAS-X PO Take 1 tablet by mouth 4 (four) times daily as needed (For gas.). Do not exceed 6 tablets in a 24 hour period.   IMODIUM A-D 2 MG tablet Generic drug:  loperamide Take 2 mg by mouth 4 (four) times daily as needed for diarrhea or loose stools.   PROBIOTIC DAILY  PO Take 2 capsules by mouth daily with lunch.   SYSTANE OP Place 1 drop into both eyes daily as needed (dry eyes).   vitamin B-12 500 MCG tablet Commonly known as:  CYANOCOBALAMIN Take 500 mcg by mouth daily with lunch.   Vitamin D3 2000 units capsule Take 4,000 Units by mouth daily with lunch.       Allergies:  Allergies  Allergen Reactions  . Doxycycline Palpitations  . Hydrocodone Other (See Comments)    Hallucinations   . Sudafed [Pseudoephedrine Hcl] Shortness Of Breath  . Acular [Ketorolac Tromethamine]     Unknown reaction   . Diphenhydramine Hcl Swelling    Throat feels like it swells   . Lactose Intolerance (Gi) Diarrhea and Other  (See Comments)    bloating  . Mesalamine Other (See Comments)    Hair loss  . Sulfasalazine Diarrhea  . Azithromycin Palpitations    Speeding heart rate per pt.   . Caffeine Palpitations  . Penicillins Rash    Has patient had a PCN reaction causing immediate rash, facial/tongue/throat swelling, SOB or lightheadedness with hypotension: Yes Has patient had a PCN reaction causing severe rash involving mucus membranes or skin necrosis: No Has patient had a PCN reaction that required hospitalization Yes Has patient had a PCN reaction occurring within the last 10 years: No If all of the above answers are "NO", then may proceed with Cephalosporin use.  . Prednisone Palpitations    Past Medical History, Surgical history, Social history, and Family History were reviewed and updated.  Review of Systems: All other 10 point review of systems is negative.   Physical Exam:  vitals were not taken for this visit.  Wt Readings from Last 3 Encounters:  01/13/17 (!) 305 lb (138.3 kg)  01/05/17 (!) 310 lb (140.6 kg)  12/09/16 (!) 310 lb (140.6 kg)    Ocular: Sclerae unicteric, pupils equal, round and reactive to light Ear-nose-throat: Oropharynx clear, dentition fair Lymphatic: No cervical, supraclavicular or axillary adenopathy Lungs no rales or rhonchi, good excursion bilaterally Heart regular rate and rhythm, no murmur appreciated Abd soft, nontender, positive bowel sounds, no liver or spleen tip palpated on exam, no fluid wave MSK no focal spinal tenderness, no joint edema Neuro: non-focal, well-oriented, appropriate affect Breasts: Deferred   Lab Results  Component Value Date   WBC 8.5 01/24/2017   HGB 13.2 01/24/2017   HCT 40.9 01/24/2017   MCV 94 01/24/2017   PLT 255 01/24/2017   Lab Results  Component Value Date   IRON 72 07/17/2013   IRONPCTSAT 24.5 07/17/2013   Lab Results  Component Value Date   RBC 4.34 01/24/2017   No results found for: Nils Pyle  [facility] Lab Results  Component Value Date   IGA 341 05/25/2015   No results found for: Odetta Pink, SPEI   Chemistry      Component Value Date/Time   NA 140 01/05/2017 2055   NA 145 (H) 11/21/2016 0740   NA 137 09/09/2016 1332   K 4.1 01/05/2017 2055   K 3.8 10/10/2016 1403   K 4.2 09/09/2016 1332   CL 105 01/05/2017 2055   CL 103 10/10/2016 1403   CL 105 09/09/2016 1332   CO2 28 01/05/2017 2055   CO2 26 10/10/2016 1403   CO2 26 09/09/2016 1332   BUN 7 01/05/2017 2055   BUN 11 11/21/2016 0740   BUN 7 09/09/2016 1332   CREATININE 0.83 01/05/2017 2055  CREATININE 0.68 10/10/2016 1403   CREATININE 0.8 09/09/2016 1332      Component Value Date/Time   CALCIUM 9.4 01/05/2017 2055   CALCIUM 9.4 10/10/2016 1403   CALCIUM 9.3 09/09/2016 1332   ALKPHOS 91 10/10/2016 1403   ALKPHOS 76 09/09/2016 1332   AST 13 10/10/2016 1403   AST 17 09/09/2016 1332   ALT 14 10/10/2016 1403   ALT 21 09/09/2016 1332   BILITOT 0.3 10/10/2016 1403   BILITOT 0.40 09/09/2016 1332      Impression and Plan: Ms. Pete is a pleasant 56 yo caucasian female with history of PE on Eliquis 5 mg po daily. She verbalized that she is taking as prescribed this as prescribed.  We will have her continue her same regimen.  We will go ahead and plan to see her back again in another 4 months for repeat lab work and follow-up.  She will contact our office with any questions or concerns. We can certainly see her sooner if need be.   Eliezer Bottom, NP 9/18/201810:02 AM

## 2017-01-25 LAB — BASIC METABOLIC PANEL
BUN / CREAT RATIO: 17 (ref 9–23)
BUN: 11 mg/dL (ref 6–24)
CO2: 23 mmol/L (ref 20–29)
CREATININE: 0.64 mg/dL (ref 0.57–1.00)
Calcium: 9 mg/dL (ref 8.7–10.2)
Chloride: 105 mmol/L (ref 96–106)
GFR calc non Af Amer: 99 mL/min/{1.73_m2} (ref >59)
GFR, EST AFRICAN AMERICAN: 115 mL/min/{1.73_m2} (ref >59)
Glucose: 99 mg/dL (ref 65–99)
Potassium: 3.8 mmol/L (ref 3.5–5.2)
Sodium: 145 mmol/L — ABNORMAL HIGH (ref 134–144)

## 2017-01-25 LAB — VITAMIN D 25 HYDROXY (VIT D DEFICIENCY, FRACTURES): Vitamin D, 25-Hydroxy: 40.8 ng/mL (ref 30.0–100.0)

## 2017-01-25 LAB — D-DIMER, QUANTITATIVE: D-DIMER: 0.3 mg/L FEU (ref 0.00–0.49)

## 2017-01-29 ENCOUNTER — Encounter: Payer: Self-pay | Admitting: Internal Medicine

## 2017-01-30 ENCOUNTER — Encounter: Payer: Self-pay | Admitting: Family

## 2017-01-30 ENCOUNTER — Encounter: Payer: Self-pay | Admitting: Internal Medicine

## 2017-02-03 ENCOUNTER — Encounter (HOSPITAL_BASED_OUTPATIENT_CLINIC_OR_DEPARTMENT_OTHER): Payer: 59

## 2017-02-03 NOTE — Telephone Encounter (Signed)
Does our office arrange home sleep studies? I have a very anxious patient who swears that she would not be able to sleep at the sleep lab but likely has OSA. Please let me know if this could be an option for her and how I go about setting it up. Thanks for your help.   [name]   The patient in question is Cassie [name] (MRN: [id number]). I have let her know to expect a call from you to help set up a home sleep study. Please let me know if any questions come up. I greatly appreciate your help.

## 2017-02-05 ENCOUNTER — Encounter (HOSPITAL_BASED_OUTPATIENT_CLINIC_OR_DEPARTMENT_OTHER): Payer: 59

## 2017-02-07 ENCOUNTER — Encounter: Payer: Self-pay | Admitting: Internal Medicine

## 2017-02-07 ENCOUNTER — Telehealth: Payer: Self-pay | Admitting: *Deleted

## 2017-02-07 DIAGNOSIS — R002 Palpitations: Secondary | ICD-10-CM

## 2017-02-08 NOTE — Telephone Encounter (Signed)
Informed patient of upcoming home sleep study and patient understanding was verbalized. Patient understands her sleep study will be done at home. Patient understands she will be contacted by [facility] to set up her study. She understands to call if [facility] does not contact her with new setup in a timely manner. She understands she will be called once confirmation has been received from [facility] that she has received her new machine to schedule 10 week follow up appointment.  [facility] notified of new order in epic All paperwork sent to [facility] She was grateful for the call and thanked me

## 2017-02-09 ENCOUNTER — Encounter: Payer: Self-pay | Admitting: Internal Medicine

## 2017-02-09 NOTE — Telephone Encounter (Signed)
Fax came today for patient from [place] stating patients' test has been cancelled for the reason :Does not meet payers medical policy requirements. CPAP assistant called [facility] to inquire about the fax and was informed by [place] at [facility] that [facility] patient's insurance carrier says the patient's BMI is over 40 so she needs an in lab study. Patient notified

## 2017-02-09 NOTE — Telephone Encounter (Signed)
Thank you for the update.  Nelva Bush, MD [facility] Pager: [phone]

## 2017-02-10 ENCOUNTER — Other Ambulatory Visit: Payer: Self-pay | Admitting: *Deleted

## 2017-02-10 DIAGNOSIS — R0602 Shortness of breath: Secondary | ICD-10-CM

## 2017-02-10 MED ORDER — ATENOLOL 25 MG PO TABS: 25.0000 mg | ORAL_TABLET | Freq: Two times a day (BID) | ORAL | 3 refills | Status: DC

## 2017-02-15 ENCOUNTER — Other Ambulatory Visit: Payer: Self-pay | Admitting: Family

## 2017-02-20 ENCOUNTER — Encounter: Payer: Self-pay | Admitting: Internal Medicine

## 2017-02-22 ENCOUNTER — Encounter: Payer: Self-pay | Admitting: Internal Medicine

## 2017-02-24 ENCOUNTER — Encounter: Payer: Self-pay | Admitting: Internal Medicine

## 2017-03-01 ENCOUNTER — Telehealth: Payer: Self-pay | Admitting: Internal Medicine

## 2017-03-01 NOTE — Telephone Encounter (Signed)
Will forward back to Cassie [name]---pt had a PFT on 07/2016 and this was read by CY ---pt has never been seen in our office by a provider.  Pt will need to be scheduled with next opening for consult.

## 2017-03-04 ENCOUNTER — Encounter: Payer: Self-pay | Admitting: Internal Medicine

## 2017-03-09 ENCOUNTER — Telehealth: Payer: Self-pay | Admitting: Internal Medicine

## 2017-03-09 DIAGNOSIS — R002 Palpitations: Secondary | ICD-10-CM

## 2017-03-09 NOTE — Telephone Encounter (Signed)
New Message  Pt call requesting to speak with RN to see if she would need to keep appt for 11/5 .please call back to discuss

## 2017-03-09 NOTE — Telephone Encounter (Signed)
Per Dr Saverio Danker plan to arrange  30 day event monitor, cancel 03/13/17 appt with Dr End, plan on follow-up appointment with Dr End about 6 weeks after monitor is scheduled. Discussed these recommendations with pt, verbalized understanding, agreed with plan.

## 2017-03-13 ENCOUNTER — Other Ambulatory Visit: Payer: Self-pay | Admitting: Family

## 2017-03-13 ENCOUNTER — Ambulatory Visit: Payer: 59 | Admitting: Internal Medicine

## 2017-03-14 ENCOUNTER — Other Ambulatory Visit: Payer: Self-pay | Admitting: Internal Medicine

## 2017-03-15 ENCOUNTER — Encounter: Payer: Self-pay | Admitting: Hematology & Oncology

## 2017-03-15 NOTE — Telephone Encounter (Signed)
Faxed to [facility] on [address]

## 2017-03-19 ENCOUNTER — Encounter: Payer: Self-pay | Admitting: Internal Medicine

## 2017-03-27 ENCOUNTER — Ambulatory Visit (INDEPENDENT_AMBULATORY_CARE_PROVIDER_SITE_OTHER): Payer: 59

## 2017-03-27 DIAGNOSIS — R002 Palpitations: Secondary | ICD-10-CM | POA: Diagnosis not present

## 2017-03-28 ENCOUNTER — Encounter: Payer: Self-pay | Admitting: Hematology & Oncology

## 2017-03-28 ENCOUNTER — Encounter (HOSPITAL_BASED_OUTPATIENT_CLINIC_OR_DEPARTMENT_OTHER): Payer: 59

## 2017-03-28 ENCOUNTER — Encounter: Payer: Self-pay | Admitting: Internal Medicine

## 2017-03-29 ENCOUNTER — Telehealth: Payer: Self-pay | Admitting: Hematology & Oncology

## 2017-03-29 DIAGNOSIS — R002 Palpitations: Secondary | ICD-10-CM | POA: Diagnosis not present

## 2017-03-29 NOTE — Telephone Encounter (Signed)
I talked with Cassie [name] by phone.  She had questions about taking the Eliquis with dental procedures.  In general, I told her that if she was going to have her tooth pulled, that she should not take the Eliquis for a whole day before her procedure and that she can start back the Eliquis the evening of her procedure.  Also, if she is going to have a periodontal procedure done, she is to stop the Eliquis the day before the scaling and then start back up the evening of the scaling procedure day.  I think by doing this, she will have a minimal risk of blood clotting in her legs are long and also a minimal risk of bleeding in the oral cavity.  She understands this.  She is very appreciative of [place] taking the time to call her and to speak with her ourselves and bypass email and any confusion that email may cause.   [name], MD

## 2017-04-17 ENCOUNTER — Institutional Professional Consult (permissible substitution): Payer: 59 | Admitting: Internal Medicine

## 2017-04-18 ENCOUNTER — Other Ambulatory Visit: Payer: Self-pay | Admitting: Family

## 2017-04-23 ENCOUNTER — Encounter: Payer: Self-pay | Admitting: Internal Medicine

## 2017-04-24 ENCOUNTER — Encounter: Payer: Self-pay | Admitting: Internal Medicine

## 2017-04-24 MED ORDER — NITROFURANTOIN MONOHYD MACRO 100 MG PO CAPS: 100.0000 mg | ORAL_CAPSULE | Freq: Two times a day (BID) | ORAL | 0 refills | Status: DC

## 2017-04-26 DIAGNOSIS — Z1231 Encounter for screening mammogram for malignant neoplasm of breast: Secondary | ICD-10-CM | POA: Diagnosis not present

## 2017-04-26 LAB — HM MAMMOGRAPHY

## 2017-04-28 ENCOUNTER — Encounter: Payer: Self-pay | Admitting: Internal Medicine

## 2017-04-28 NOTE — Progress Notes (Signed)
Abstracted and sent to scan  

## 2017-05-02 ENCOUNTER — Encounter: Payer: Self-pay | Admitting: Internal Medicine

## 2017-05-02 DIAGNOSIS — R300 Dysuria: Secondary | ICD-10-CM

## 2017-05-05 ENCOUNTER — Encounter: Payer: Self-pay | Admitting: Internal Medicine

## 2017-05-05 ENCOUNTER — Ambulatory Visit: Payer: 59 | Admitting: Internal Medicine

## 2017-05-05 ENCOUNTER — Other Ambulatory Visit: Payer: 59

## 2017-05-05 VITALS — BP 112/70 | HR 65 | Ht 65.0 in | Wt 317.6 lb

## 2017-05-05 DIAGNOSIS — E278 Other specified disorders of adrenal gland: Secondary | ICD-10-CM | POA: Diagnosis not present

## 2017-05-05 DIAGNOSIS — R300 Dysuria: Secondary | ICD-10-CM

## 2017-05-05 LAB — POTASSIUM: Potassium: 4 mEq/L (ref 3.5–5.1)

## 2017-05-05 MED ORDER — DEXAMETHASONE 1 MG PO TABS: ORAL_TABLET | ORAL | 0 refills | Status: DC

## 2017-05-05 NOTE — Patient Instructions (Addendum)
Please stop at the lab.  Please take Dexamethasone 1 mg at 11 pm the night before coming for labs at 8 am.  Please return to see me as needed.

## 2017-05-05 NOTE — Progress Notes (Signed)
Patient ID: MELVIN MARMO, female   DOB: February 08, 1961, 56 y.o.   MRN: [id number]   HPI  Cassie [name] is a 56 y.o.-year-old female, initially referred by Dr. Delfin Edis, returning for follow-up for bilateral adrenal incidentaloma.  Last visit a year ago.  She had a PE 06/2016. On Eloquis >> joint pain.  Reviewed and addended history: Patient was incidentally found to have 2 adrenal nodules on a CT scan obtained before her colonoscopy from 2013.   Abdominal CT scan (02/10/2012): 31.5 HU nodule in the left adrenal gland measures 1.1 x 1.7 cm. Left adrenal nodule measures 9.5 HU nodule in the right adrenal gland measures 1 cm.   Abdominal MRI with focus on the adrenal (05/2013) showed stable appearance of the adrenal nodules.  Enlargement of the left adrenal gland to 23 x 14 mm is similar to comparison CT. There is loss of signal intensity on opposed phase imaging consistent with a benign adenoma.  Mild nodular enlargement of the right adrenal gland to 11 mm is also unchanged. The right adrenal gland demonstrates loss signal intensity on opposed phase imaging consistent with a benign adenoma.  Abdominal CT scan with contrast for investigation of diarrhea and abdominal cramping (11/2013): confirmed stable adrenal nodules consistent with adenomas. She had GB surgery soon after this.  There are small bilateral adrenal gland nodules, similar to prior MRI examination and consistent with adenoma.   Abdominal CT (03/24/2016): minimal nonspecific nodularity at the adrenal glands.  No significant mass was seen.  The hormonal workup was normal in 2017, at last visit (she did not have a dexamethasone suppression test at that time, but had it at the beginning of this year): Component     Latest Ref Rng & Units 05/06/2016  Epinephrine     pg/mL 51  Norepinephrine     pg/mL 501  Dopamine      REPORT  Catecholamines, Total     pg/mL 552  PRA LC/MS/MS     0.25 - 5.82 ng/mL/h 1.32  ALDO / PRA  Ratio     0.9 - 28.9 Ratio SEE NOTE  ALDOSTERONE     ng/dL <1  Metanephrine, Pl     <=57 pg/mL 37  Normetanephrine, Pl     <=148 pg/mL 73  Total Metanephrines-Plasma     <=205 pg/mL 110  Potassium     3.5 - 5.1 mEq/L 4.1   The adrenal hormonal workup was also normal in 2014, 2015, 2016 - reviewed with the patient today: Component     Latest Ref Rng & Units 03/25/2013 03/25/2014 03/26/2015  Epinephrine     pg/mL 45 38 37  Norepinephrine     pg/mL 365 300 393  Dopamine      < LLN < LLN < LLN  Catecholamines, Total     pg/mL 410 338 430  PRA LC/MS/MS     0.25 - 5.82 ng/mL/h 3.69 4.07 3.53  ALDO / PRA Ratio     0.9 - 28.9 Ratio 2.4 1.0 1.4  ALDOSTERONE     ng/dL 9 4 5   Metanephrine, Pl     <=57 pg/mL <25 27 33  Normetanephrine, Pl     <=148 pg/mL 48 59 62  Total Metanephrines-Plasma     <=205 pg/mL 48 86 95  Of note, potassium levels were normal at all checks.  Dexamethasone stimulation tests normal at the beginning of this year, but also in 2014, 2015, 2016: Component     Latest Ref Rng &  Units 06/20/2016  Cortisol - AM     mcg/dL 2.4 (L)   Component     Latest Ref Rng & Units 04/09/2013 04/01/2014 03/30/2015  Cortisol, Plasma     ug/dL 2.6 2.2 2.6   She does not have HTN, but is on Atenolol 25 mg bid for occasional palpitations.  Has UC. She has been on Prednisone for the UC years ago and also got inj with steroids in the past, not recently.  ROS: Constitutional: + weight gain/no weight loss, no fatigue, no subjective hyperthermia, no subjective hypothermia Eyes: no blurry vision, no xerophthalmia ENT: no sore throat, no nodules palpated in throat, no dysphagia, no odynophagia, no hoarseness Cardiovascular: no CP/+ SOB/+ palpitations/+ leg swelling Respiratory: no cough/+ SOB/no wheezing Gastrointestinal: no N/no V/no D/no C/no acid reflux Musculoskeletal: + muscle aches/+ joint aches Skin: no rashes, + hair loss Neurological: + tremors/no numbness/no  tingling/no dizziness  I reviewed pt's medications, allergies, PMH, social hx, family hx, and changes were documented in the history of present illness. Otherwise, unchanged from my initial visit note. Stopped Jintelli.  PE: BP 112/70   Pulse 65   Ht 5' 5"  (1.651 m)   Wt (!) 317 lb 9.6 oz (144.1 kg)   SpO2 97%   BMI 52.85 kg/m  Body mass index is 52.85 kg/m. Wt Readings from Last 3 Encounters:  05/05/17 (!) 317 lb 9.6 oz (144.1 kg)  01/24/17 (!) 310 lb (140.6 kg)  01/13/17 (!) 305 lb (138.3 kg)   Constitutional: Obese, in NAD Eyes: PERRLA, EOMI, no exophthalmos ENT: moist mucous membranes, no thyromegaly, no cervical lymphadenopathy Cardiovascular: RRR, No MRG Respiratory: CTA B Gastrointestinal: abdomen soft, NT, ND, BS+ Musculoskeletal: no deformities, strength intact in all 4 Skin: moist, warm, no rashes Neurological: no tremor with outstretched hands, DTR normal in all 4  ASSESSMENT: 1. Adrenal incidentaloma  PLAN:  1. Patient with 2 (bilateral) adrenal nodules discovered incidentally in 2013.  Since then, she was followed yearly with annual plasma metanephrines, catecholamines, plasma renin activity, and aldosterone levels and also dexamethasone suppression test and all of these have been normal.  Therefore, her adrenal nodules appear to be nonsecreting.  We also reviewed together her previous imaging scans and they were no worrisome changes.  Last CT of the abdomen was obtained a year ago. - We discussed about the recommended follow-up for her nodules: We will recheck her adrenal labs today and we can then stop (total 5 years of negative tests).  We can also start checking adrenal imaging since the nodules have not increased between 2013 and 2017. - We again reviewed possible adrenal pathology: - A nonfunctioning adrenal nodule >> most likely her case - A functioning adrenal adenoma - which can hypersecrete catecholamines/metanephrines, cortisol, or aldosterone -unlikely due  to previous normal test results - Adrenal cancer/mets -no known cancer and there was no increase in size in her adrenal nodules, as mentioned above - If the labs are normal, no further follow-up is necessary  Component     Latest Ref Rng & Units 05/05/2017          Epinephrine     pg/mL 129  Norepinephrine     pg/mL 522  Dopamine     pg/mL see note  Catecholamines, Total     pg/mL 651  Metanephrine, Pl     <=57 pg/mL 32  Normetanephrine, Pl     <=148 pg/mL 77  Total Metanephrines-Plasma     <=205 pg/mL 109  ALDOSTERONE  ng/dL 2  Renin Activity     0.25 - 5.82 ng/mL/h 1.45  ALDO / PRA Ratio     0.9 - 28.9 Ratio 1.4  Potassium     3.5 - 5.1 mEq/L 4.0   Office Visit on 05/05/2017  Component Date Value Ref Range Status  . Metanephrine, Free 05/05/2017 32  <=57 pg/mL Final   Comment: . This test was developed and its analytical performance characteristics have been determined by [place], [place]. It has not been cleared or approved by the U.S. Food and Drug Administration. This assay has been validated pursuant to the CLIA regulations and is used for clinical purposes. Marland Kitchen   Darol Destine, Free 05/05/2017 77  <=148 pg/mL Final   Comment: . This test was developed and its analytical performance characteristics have been determined by [place], [place]. It has not been cleared or approved by the U.S. Food and Drug Administration. This assay has been validated pursuant to the CLIA regulations and is used for clinical purposes. .   . Total Metanephrines-Plasma 05/05/2017 109  <=205 pg/mL Final   Comment: . For additional information, please refer to http://education.questdiagnostics.com/faq/MetFractFree (This link is being provided for informational/educatio informational/educational purposes only.) . Elevations >4-fold upper reference range: strongly suggestive of a  pheochromocytoma(1). . Elevations >1- 4-fold upper reference range: significant but not diagnostic, may be due to medications or stress. Suggest running 24 hr urine fractionated metanephrines and/or serum Chromagranin A for confirmation. . Reference: . (1)Algeciras-Schimnich A et al, Plasma Chromogranin A or Urine Fractionated Metanephrines Follow-Up Testing Improves the Diagnostic Accuracy of Plasma Fractionated Metanephrines for Pheochromocytoma. The Journal of Clinical Endocrinology # Metabolism 93(1), 91-95, 2008. . . . This test was developed and its analytical performance characteristics have been determined by [place], [place]. It has not been cleared or a                          pproved by the U.S. Food and Drug Administration. This assay has been validated pursuant to the CLIA regulations and is used for clinical purposes. .   . Epinephrine 05/05/2017 129  pg/mL Final   Comment: . This test was developed and its analytical performance characteristics have been determined by [place], [place]. It has not been cleared or approved by the U.S. Food and Drug Administration. This assay has been validated pursuant to the CLIA regulations and is used for clinical purposes. .   . Norepinephrine 05/05/2017 522  pg/mL Final   Comment: . This test was developed and its analytical performance characteristics have been determined by [place], [place]. It has not been cleared or approved by the U.S. Food and Drug Administration. This assay has been validated pursuant to the CLIA regulations and is used for clinical purposes. .   . Dopamine 05/05/2017 see note  pg/mL Final   Comment: Results are below reportable range for this analyte, which is 30 pg/mL. . This test was developed and its analytical performance characteristics have been determined by [place], [place]. It has not been cleared or approved by the U.S. Food and Drug Administration. This assay has been validated pursuant to the CLIA regulations and is used for clinical purposes. .   . Catecholamines, Total 05/05/2017 651  pg/mL Final   Comment: . Adult Reference Ranges for Catecholamines, Plasma . Epinephrine  Supine:  LESS THAN 50 pg/mL                     Upright: LESS THAN 95 pg/mL . Norepinephrine      Supine:  112-658 pg/mL                     Upright: [phone] pg/mL . Dopamine            Supine:  LESS THAN 30 pg/mL                     Upright: LESS THAN 30 pg/mL . Total (N+E)         Supine:  123-671 pg/mL                     Upright: [phone] pg/mL . Pediatric Reference Ranges for Catecholamines, Plasma . Due to stress, plasma catecholamine levels are generally unreliable in infants and small children. Urinary catecholamine assays are more reliable. Marland Kitchen Epinephrine         Supine:  LESS THAN OR EQUAL    3-15 Years                TO 464 pg/mL                     Upright: Not Available . Norepinephrine      Supine:  LESS THAN OR EQUAL    3-15 Years                TO 1251 pg/mL                     Upright: Not Available . Dopamine            Supine:  LESS THAN 60 pg/mL    3-15 Years                                 Upright: Not Available . Pediatric data from [facility] [phone]. . This test was developed and its analytical performance characteristics have been determined by [place], [place]. It has not been cleared or approved by the U.S. Food and Drug Administration. This assay has been validated pursuant to the CLIA regulations and is used for clinical purposes. .   . Aldosterone 05/05/2017 2   ng/dL Final   Comment: . Unable to flag abnormal result(s), please refer     to reference range(s) below: . Adult Reference Ranges for Aldosterone, LC/MS/MS:     Upright  8:00 -  10:00 am    < or = 28 ng/dL     Upright  4:00 -  6:00 pm    < or = 21 ng/dL     Supine   8:00 - 10:00 am       3 - 16 ng/dL .   Marland Kitchen Renin Activity 05/05/2017 1.45  0.25 - 5.82 ng/mL/h Final  . ALDO / PRA Ratio 05/05/2017 1.4  0.9 - 28.9 Ratio Final   Comment: . This test was developed and its analytical performance characteristics have been determined by [place], [place]. It has not been cleared or approved by the U.S. Food and Drug Administration. This assay has been validated pursuant to the CLIA regulations and is used for clinical purposes. .   . Potassium 05/05/2017 4.0  3.5 -  5.1 mEq/L Final   Slightly high epinephrine level.  The rest of the labs are normal.  Patient was not feeling great at the time of the test.  We will repeat the epinephrine level in 3 months.  Philemon Kingdom, MD PhD [facility] Endocrinology

## 2017-05-08 ENCOUNTER — Encounter: Payer: Self-pay | Admitting: Internal Medicine

## 2017-05-08 ENCOUNTER — Other Ambulatory Visit: Payer: 59

## 2017-05-08 LAB — URINE CULTURE
MICRO NUMBER:: 81457444
SPECIMEN QUALITY: ADEQUATE

## 2017-05-10 ENCOUNTER — Encounter: Payer: Self-pay | Admitting: Hematology & Oncology

## 2017-05-10 ENCOUNTER — Encounter: Payer: Self-pay | Admitting: Internal Medicine

## 2017-05-11 ENCOUNTER — Encounter: Payer: Self-pay | Admitting: Internal Medicine

## 2017-05-11 ENCOUNTER — Other Ambulatory Visit: Payer: 59

## 2017-05-11 LAB — ALDOSTERONE + RENIN ACTIVITY W/ RATIO
ALDO / PRA RATIO: 1.4 ratio (ref 0.9–28.9)
ALDOSTERONE: 2 ng/dL
RENIN ACTIVITY: 1.45 ng/mL/h (ref 0.25–5.82)

## 2017-05-11 LAB — CATECHOLAMINES, FRACTIONATED, PLASMA
CATECHOLAMINES, TOTAL: 651 pg/mL
Epinephrine: 129 pg/mL
Norepinephrine: 522 pg/mL

## 2017-05-11 LAB — METANEPHRINES, PLASMA
METANEPHRINE FREE: 32 pg/mL (ref <=57)
NORMETANEPHRINE FREE: 77 pg/mL (ref <=148)
Total Metanephrines-Plasma: 109 pg/mL (ref <=205)

## 2017-05-13 ENCOUNTER — Encounter: Payer: Self-pay | Admitting: Internal Medicine

## 2017-05-15 ENCOUNTER — Encounter: Payer: Self-pay | Admitting: Hematology & Oncology

## 2017-05-15 ENCOUNTER — Other Ambulatory Visit: Payer: Self-pay | Admitting: Family

## 2017-05-15 ENCOUNTER — Telehealth: Payer: Self-pay | Admitting: Internal Medicine

## 2017-05-15 NOTE — Telephone Encounter (Signed)
Also, see message sent from [place].

## 2017-05-15 NOTE — Telephone Encounter (Signed)
Copied from [place] [id number]. Topic: Inquiry >> May 15, 2017  8:35 AM Cecelia Byars, NT wrote: Reason for CRM: Patient would like a response to lab results on my chart ,and thinks it may be staph infection ,please do not call in anything until they speak with her personally ,and also please call as early as possible , [phone]

## 2017-05-15 NOTE — Telephone Encounter (Signed)
Patient has appointment 05/16/17 at 1:15

## 2017-05-16 ENCOUNTER — Encounter: Payer: Self-pay | Admitting: Internal Medicine

## 2017-05-16 ENCOUNTER — Ambulatory Visit: Payer: 59 | Admitting: Internal Medicine

## 2017-05-16 VITALS — BP 146/92 | HR 58 | Temp 97.9°F | Ht 65.0 in | Wt 317.0 lb

## 2017-05-16 DIAGNOSIS — R300 Dysuria: Secondary | ICD-10-CM | POA: Diagnosis not present

## 2017-05-16 DIAGNOSIS — F418 Other specified anxiety disorders: Secondary | ICD-10-CM

## 2017-05-16 LAB — POCT URINALYSIS DIPSTICK
Bilirubin, UA: NEGATIVE
Glucose, UA: NEGATIVE
Ketones, UA: 1.03
LEUKOCYTES UA: NEGATIVE
NITRITE UA: NEGATIVE
Protein, UA: NEGATIVE
Urobilinogen, UA: 0.2 E.U./dL
pH, UA: 6 (ref 5.0–8.0)

## 2017-05-16 NOTE — Progress Notes (Signed)
   Subjective:    Patient ID: Cassie [name], female    DOB: 1961-02-23, 57 y.o.   MRN: [id number]  HPI The patient is a 57 YO female coming in for having white WBC in her urine at home. She had been treated for UTI and was rechecking this. She is not currently having symptoms. She had urine culture done with [place] with skin flora but she was very concerned about this and wanted treatment. She denies burning, urgency. She denies fevers or chills. No nausea or vomiting. She still had some leukocytes on the last test at home but no nitrites. She had the strip left in the pack so decided to use it. She is still feeling unwell which is chronic. She is still having SOB with exertion and has had extensive workup with pulmonary and cardiology.   Review of Systems  Constitutional: Positive for activity change, appetite change and fatigue.  HENT: Negative.   Eyes: Negative.   Respiratory: Positive for shortness of breath. Negative for cough and chest tightness.   Cardiovascular: Negative for chest pain, palpitations and leg swelling.  Gastrointestinal: Negative for abdominal distention, abdominal pain, constipation, diarrhea, nausea and vomiting.  Genitourinary: Negative for difficulty urinating, dysuria, flank pain, frequency, pelvic pain and urgency.  Musculoskeletal: Positive for arthralgias.  Skin: Negative.   Neurological: Negative.   Psychiatric/Behavioral: Negative.       Objective:   Physical Exam  Constitutional: She is oriented to person, place, and time. She appears well-developed and well-nourished.  Overweight  HENT:  Head: Normocephalic and atraumatic.  Eyes: EOM are normal.  Neck: Normal range of motion.  Cardiovascular: Normal rate and regular rhythm.  Pulmonary/Chest: Effort normal and breath sounds normal. No respiratory distress. She has no wheezes. She has no rales.  Abdominal: Soft. Bowel sounds are normal. She exhibits no distension. There is no tenderness. There is no  rebound.  Musculoskeletal: She exhibits no edema.  Neurological: She is alert and oriented to person, place, and time. Coordination normal.  Skin: Skin is warm and dry.  Psychiatric: She has a normal mood and affect.   Vitals:   05/16/17 1318  BP: (!) 146/92  Pulse: (!) 58  Temp: 97.9 F (36.6 C)  TempSrc: Oral  SpO2: 100%  Weight: (!) 317 lb (143.8 kg)  Height: 5' 5"  (1.651 m)      Assessment & Plan:

## 2017-05-16 NOTE — Patient Instructions (Signed)
Let us know if you want to see the sports medicine doctor for the hips.

## 2017-05-18 ENCOUNTER — Ambulatory Visit: Payer: 59 | Admitting: Internal Medicine

## 2017-05-18 ENCOUNTER — Encounter: Payer: Self-pay | Admitting: Internal Medicine

## 2017-05-18 VITALS — BP 126/78 | HR 62 | Ht 65.0 in | Wt 317.4 lb

## 2017-05-18 DIAGNOSIS — R600 Localized edema: Secondary | ICD-10-CM | POA: Diagnosis not present

## 2017-05-18 DIAGNOSIS — Q245 Malformation of coronary vessels: Secondary | ICD-10-CM | POA: Diagnosis not present

## 2017-05-18 DIAGNOSIS — R002 Palpitations: Secondary | ICD-10-CM | POA: Diagnosis not present

## 2017-05-18 MED ORDER — FUROSEMIDE 20 MG PO TABS: 20.0000 mg | ORAL_TABLET | Freq: Every day | ORAL | 3 refills | Status: DC

## 2017-05-18 MED ORDER — ATENOLOL 50 MG PO TABS: ORAL_TABLET | ORAL | 3 refills | Status: DC

## 2017-05-18 NOTE — Patient Instructions (Addendum)
Medication Instructions:   Start Lasix (Furosemide) 20 mg daily  Atenolol 50 mg take 1/2 tablet (92m)  two times daily   -- If you need a refill on your cardiac medications before your next appointment, please call your pharmacy. --  Labwork:  Next week   Testing/Procedures:  None ordered   Follow-Up:  Your physician wants you to follow-up on April 11th at 8:00am with Dr. ESaunders Revel    Thank you for choosing [facility]!!    Any Other Special Instructions Will Be Listed Below (If Applicable).

## 2017-05-18 NOTE — Progress Notes (Signed)
Follow-up Outpatient Visit Date: 05/18/2017  Primary Care Provider: [place], [place] [id number]  Chief Complaint: Palpitations and leg swelling  HPI:  [name] is a 57 y.o. year-old female with history of palpitations and PVCs, pulmonary embolism on apixaban, ulcerative colitis, anxiety, vitamin B12 deficiency, vitamin D deficiency, and morbid obesity, who presents for follow-up of multiple cardiac complaints. I last saw her in September. She recently underwent a 30 day event monitor, which showed PACs and PVCs as well as brief atrial runs lasting up to 20 seconds. [name] notes that her palpitations seem to have gotten a bit worse since our last visit. She also feels as though her legs have been more swollen. She does not notice any improvement with elevation of her legs or compression stocking use. She is not on a diuretic. [name] denies chest pain. Her shortness of breath is stable. She remains on apixaban and is scheduled to follow up with Dr. Marin Olp next month.  [name] has yet to have sleep study performed. She is concerned about doing it in a sleep lab, as she does not believe that she will be able to sleep well. She was denied a home sleep study by her insurance due to her BMI.  --------------------------------------------------------------------------------------------------  Cardiovascular History & Procedures: Cardiovascular Problems:  Dyspnea on exertion  Palpitations  Pulmonary embolism  Risk Factors:  Coronary artery calcium on CT, obesity, and sedentary lifestyle  Cath/PCI:  None  CV Surgery:  None  EP Procedures and Devices:  30-day event monitor (03/27/17): Predominantly sinus rhythm with PACs, PVCs, and brief paroxysmal atrial tachycardia. No sustained arrhythmia or prolonged pause.  48-hour Holter monitor: Predominantly sinus rhythm with rare PACs, PVCs, and very short atrial runs. No  significant arrhythmia.  Non-Invasive Evaluation(s):  Cardiac CTA (11/25/16): Normal aorta. Trileaflet aortic valve without calcification. Right dominant coronary arteries with normal origins. LMCA is normal. LAD has mild calcified plaque in the proximal segment (0-25%). Mid LAD has a 12 mm intramyocardial bridge. CT FFR of distal LAD 0.8. LCx is nondominant and normal. RCA is large with minimal calcification and no significant stenosis. Coronary artery calcium score 54.  CTA chest (09/09/16): No PE. Coronary artery calcification involving the LAD is noted. No significant lung abnormalities.  TTE (07/04/16): Nromal LV size and contraction (LVEF 55-60%) with normal diastolic function. Normal RV size and function. No significant valvular abnormalities.  Pharmacologic MPI (01/01/04): Abnormal study without ischemia or scar. No significant EKG abnormalities. Normal LV size and function.  Recent CV Pertinent Labs: Lab Results  Component Value Date   CHOL 175 12/25/2015   HDL 40.30 12/25/2015   LDLCALC 114 (H) 12/25/2015   TRIG 103.0 12/25/2015   CHOLHDL 4 12/25/2015   BNP 54.4 01/05/2017   K 4.0 05/05/2017   K 4.0 01/24/2017   MG 2.1 02/07/2011   BUN 9.5 01/24/2017   CREATININE 0.8 01/24/2017    Past medical and surgical history were reviewed and updated in EPIC.  Current Meds  Medication Sig  . ALPRAZolam (XANAX) 1 MG tablet TAKE ONE-HALF TO 1 TABLET TWICE A DAY AS NEEDED FOR ANXIETY  . Cholecalciferol (VITAMIN D3) 2000 units capsule Take 4,000 Units by mouth daily with lunch.  . dexamethasone (DECADRON) 1 MG tablet Take 1 tablet by mouth once at 11 pm, before coming for labs at 8 am the next morning  . ELIQUIS 5 MG TABS tablet TAKE 1 TABLET BY MOUTH TWICE DAILY  .  folic acid (FOLVITE) 620 MCG tablet Take 800 mcg by mouth daily.  Marland Kitchen loperamide (IMODIUM A-D) 2 MG tablet Take 2 mg by mouth 4 (four) times daily as needed for diarrhea or loose stools.  Vladimir Faster Glycol-Propyl Glycol  (SYSTANE OP) Place 1 drop into both eyes daily as needed (dry eyes).  . Probiotic Product (PROBIOTIC DAILY PO) Take 2 capsules by mouth daily with lunch.   . Simethicone (GAS-X PO) Take 1 tablet by mouth 4 (four) times daily as needed (For gas.). Do not exceed 6 tablets in a 24 hour period.  . vitamin B-12 (CYANOCOBALAMIN) 500 MCG tablet Take 500 mcg by mouth daily with lunch.  . [DISCONTINUED] atenolol (TENORMIN) 25 MG tablet Take 25 mg by mouth 2 (two) times daily.    Allergies: Doxycycline; Hydrocodone; Sudafed [pseudoephedrine hcl]; Acular [ketorolac tromethamine]; Diphenhydramine hcl; Lactose intolerance (gi); Mesalamine; Sulfasalazine; Azithromycin; Caffeine; Penicillins; and Prednisone  Social History   Socioeconomic History  . Marital status: Married    Spouse name: Not on file  . Number of children: 0  . Years of education: Not on file  . Highest education level: Not on file  Social Needs  . Financial resource strain: Not on file  . Food insecurity - worry: Not on file  . Food insecurity - inability: Not on file  . Transportation needs - medical: Not on file  . Transportation needs - non-medical: Not on file  Occupational History  . Occupation: Arboriculturist: UNEMPLOYED  Tobacco Use  . Smoking status: Former Smoker    Types: Cigarettes    Last attempt to quit: 03/07/1979    Years since quitting: 38.2  . Smokeless tobacco: Never Used  Substance and Sexual Activity  . Alcohol use: No    Alcohol/week: 0.0 oz  . Drug use: No  . Sexual activity: Yes    Partners: Male  Other Topics Concern  . Not on file  Social History Narrative   Married: 0 kids   Regular exercise: walks 2 times a week   Caffeine use: none    Family History  Problem Relation Age of Onset  . Heart disease Father 44       pacemaker  . Atrial fibrillation Father   . Hypertension Maternal Aunt   . Hypertension Maternal Grandfather   . Colon cancer Neg Hx   . Stomach cancer Neg Hx   .  Colon polyps Neg Hx   . Esophageal cancer Neg Hx   . Rectal cancer Neg Hx     Review of Systems: A 12-system review of systems was performed and was negative except as noted in the HPI.  --------------------------------------------------------------------------------------------------  Physical Exam: BP 126/78   Pulse 62   Ht 5' 5"  (1.651 m)   Wt (!) 317 lb 6.4 oz (144 kg)   SpO2 96%   BMI 52.82 kg/m   General:  Morbidly obese woman, accompanied by her husband. HEENT: No conjunctival pallor or scleral icterus. Moist mucous membranes.  OP clear. Neck: Supple. Unable to assess JVP due to body habitus. Lungs: Normal work of breathing. Clear to auscultation bilaterally without wheezes or crackles. Heart: Distant heart sounds. Regular rate and rhythm without murmurs, rubs, or gallops. Unable to assess PMI due to body habitus. Abd: Bowel sounds present. Soft, NT/ND. Unable to assess HSM due to body habitus. Ext: Trace ankle edema. Radial, PT, and DP pulses are 2+ bilaterally. Skin: Warm and dry without rash.  Lab Results  Component Value Date  WBC 8.5 01/24/2017   HGB 13.2 01/24/2017   HCT 40.9 01/24/2017   MCV 94 01/24/2017   PLT 255 01/24/2017    Lab Results  Component Value Date   NA 139 01/24/2017   K 4.0 05/05/2017   CL 105 01/05/2017   CO2 24 01/24/2017   BUN 9.5 01/24/2017   CREATININE 0.8 01/24/2017   GLUCOSE 100 01/24/2017   ALT 16 01/24/2017    Lab Results  Component Value Date   CHOL 175 12/25/2015   HDL 40.30 12/25/2015   LDLCALC 114 (H) 12/25/2015   TRIG 103.0 12/25/2015   CHOLHDL 4 12/25/2015    --------------------------------------------------------------------------------------------------  ASSESSMENT AND PLAN: Palpitations Symptoms seem to have progressed since our last visit. Holter and event monitors showed PACs and PVCs as well as brief atrial runs. I reassured Ms. Pickerill that no life threatening arrhythmias were identified. Low resting  heart rate precludes uptitration of atenolol. I advised her that if she is significantly bothered by her palpitations, that I would refer her to electrophysiology to discuss utility of antiarrhythmic therapy. She declined EP referral at this time.  Myocardial bridge No further chest pain. Continue atenolol.  Lower extremity edema Negligible edema noted on exam today, though Ms. Schwalb reports uncomfortable swelling at times that is unresponsive to compression stockings and elevation of the legs. Given her history of PE, she likely had a DVT and may be experiencing some element of postthrombotic syndrome. I encouraged her to continue with compression stockings. We will start furosemide 20 mg daily. She is scheduled for labs when she sees Dr. Marin Olp later this month, including a CMP. I have encouraged her to proceed with sleep study, as leg swelling could certainly be a manifestation of untreated sleep apnea.  Morbid obesity Ms. Tropea has not lost any weight since our last visit, though I suspect most of her symptoms are directly attributable to her morbid obesity. I again stressed the importance of weight loss.  Follow-up: Return to clinic in 3 months.  Nelva Bush, MD 05/19/2017 9:37 PM

## 2017-05-19 ENCOUNTER — Encounter: Payer: Self-pay | Admitting: Internal Medicine

## 2017-05-19 DIAGNOSIS — R600 Localized edema: Secondary | ICD-10-CM | POA: Insufficient documentation

## 2017-05-19 DIAGNOSIS — R300 Dysuria: Secondary | ICD-10-CM | POA: Insufficient documentation

## 2017-05-19 NOTE — Assessment & Plan Note (Signed)
She is given some reassurance that if there was a serious problem causing the SOB we would have found it already given the workup she has had. We should work on conditioning to see if this helps as her testing shows that she is healthy enough for exercise.

## 2017-05-19 NOTE — Assessment & Plan Note (Signed)
This resolved after appropriate antibiotic treatment. U/A done in the office did not reveal any leukocytes or nitrites and so treatment is not given and reassurance is given.

## 2017-05-23 ENCOUNTER — Ambulatory Visit: Payer: 59 | Admitting: Family

## 2017-05-23 ENCOUNTER — Other Ambulatory Visit: Payer: 59

## 2017-05-29 ENCOUNTER — Institutional Professional Consult (permissible substitution): Payer: 59 | Admitting: Internal Medicine

## 2017-05-30 ENCOUNTER — Inpatient Hospital Stay: Payer: 59 | Attending: Family

## 2017-05-30 ENCOUNTER — Inpatient Hospital Stay: Payer: 59 | Admitting: Family

## 2017-05-30 VITALS — BP 139/65 | HR 66 | Temp 98.0°F | Resp 18 | Wt 323.2 lb

## 2017-05-30 DIAGNOSIS — Z7901 Long term (current) use of anticoagulants: Secondary | ICD-10-CM | POA: Insufficient documentation

## 2017-05-30 DIAGNOSIS — I2699 Other pulmonary embolism without acute cor pulmonale: Secondary | ICD-10-CM | POA: Diagnosis not present

## 2017-05-30 DIAGNOSIS — R5383 Other fatigue: Secondary | ICD-10-CM

## 2017-05-30 DIAGNOSIS — R52 Pain, unspecified: Secondary | ICD-10-CM | POA: Insufficient documentation

## 2017-05-30 DIAGNOSIS — R0609 Other forms of dyspnea: Secondary | ICD-10-CM

## 2017-05-30 LAB — CBC WITH DIFFERENTIAL (CANCER CENTER ONLY)
BASOS PCT: 0 %
Basophils Absolute: 0 10*3/uL (ref 0.0–0.1)
EOS ABS: 0.2 10*3/uL (ref 0.0–0.5)
Eosinophils Relative: 2 %
HCT: 41 % (ref 34.8–46.6)
Hemoglobin: 12.9 g/dL (ref 11.6–15.9)
Lymphocytes Relative: 24 %
Lymphs Abs: 1.9 10*3/uL (ref 0.9–3.3)
MCH: 29.8 pg (ref 26.0–34.0)
MCHC: 31.5 g/dL — AB (ref 32.0–36.0)
MCV: 94.7 fL (ref 81.0–101.0)
MONO ABS: 0.5 10*3/uL (ref 0.1–0.9)
Monocytes Relative: 7 %
Neutro Abs: 5.2 10*3/uL (ref 1.5–6.5)
Neutrophils Relative %: 67 %
Platelet Count: 258 10*3/uL (ref 145–400)
RBC: 4.33 MIL/uL (ref 3.70–5.32)
RDW: 14.9 % (ref 11.1–15.7)
WBC Count: 7.8 10*3/uL (ref 3.9–10.3)

## 2017-05-30 LAB — CMP (CANCER CENTER ONLY)
ALBUMIN: 3.2 g/dL — AB (ref 3.5–5.0)
ALT: 14 U/L (ref 0–55)
ANION GAP: 8 (ref 3–11)
AST: 13 U/L (ref 5–34)
Alkaline Phosphatase: 108 U/L (ref 40–150)
BUN: 9 mg/dL (ref 7–26)
CO2: 26 mmol/L (ref 22–29)
Calcium: 9.3 mg/dL (ref 8.4–10.4)
Chloride: 108 mmol/L (ref 98–109)
Creatinine: 0.87 mg/dL (ref 0.60–1.10)
GFR, Estimated: >60 mL/min (ref >60)
GLUCOSE: 108 mg/dL (ref 70–140)
POTASSIUM: 4.4 mmol/L (ref 3.3–4.7)
SODIUM: 142 mmol/L (ref 136–145)
Total Bilirubin: 0.3 mg/dL (ref 0.2–1.2)
Total Protein: 7.2 g/dL (ref 6.4–8.3)

## 2017-05-30 LAB — D-DIMER, QUANTITATIVE: D-Dimer, Quant: <0.27 ug/mL-FEU (ref 0.00–0.50)

## 2017-05-30 NOTE — Progress Notes (Signed)
Hematology and Oncology Follow Up Visit  Cassie [name] [id number] 1960/11/20 57 y.o. 05/30/2017   Principle Diagnosis:  Idiopathic right lower lobe pulmonary embolism Ulcerative colitis  Current Therapy:   ELIQUIS 5 mg by mouth twice a day    Interim History:  Cassie [name] is here today for follow-up. She is still feeling "bad". She has generalized aches and pains as well as fatigue. She states that her pulmonologist and cardiologist work-ups were both negative.  SOB with exertion is unchanged and she still has occasional palpitations.  She states that she feels the Eliquis is "poison". We have offered to discontinue this multiple times and and try a different anticoagulant but she wants to stay on the Eliquis.   She will have occasional bleeding around her gums with brushing but this stops quickly. No other bleeding, no bruising or petechiae.  No lymphadenopathy noted on exam.  No fever, chills, n/v, cough, rash, dizziness, chest pain, abdominal pain or changes in bowel or bladder habits.  No swelling, numbness or tingling in her extremities at this time. She has a good appetite and is staying well hydrated. Her weight is now up to 323 lbs.   ECOG Performance Status: 1 - Symptomatic but completely ambulatory  Medications:  Allergies as of 05/30/2017      Reactions   Doxycycline Palpitations   Hydrocodone Other (See Comments)   Hallucinations    Sudafed [pseudoephedrine Hcl] Shortness Of Breath   Acular [ketorolac Tromethamine]    Unknown reaction   Diphenhydramine Hcl Swelling   Throat feels like it swells    Lactose Intolerance (gi) Diarrhea, Other (See Comments)   bloating   Mesalamine Other (See Comments)   Hair loss   Sulfasalazine Diarrhea   Azithromycin Palpitations   Speeding heart rate per pt.   Caffeine Palpitations   Penicillins Rash   Has patient had a PCN reaction causing immediate rash, facial/tongue/throat swelling, SOB or lightheadedness with  hypotension: Yes Has patient had a PCN reaction causing severe rash involving mucus membranes or skin necrosis: No Has patient had a PCN reaction that required hospitalization Yes Has patient had a PCN reaction occurring within the last 10 years: No If [name] of the above answers are "NO", then may proceed with Cephalosporin use.   Prednisone Palpitations      Medication List        Accurate as of 05/30/17 10:03 AM. Always use your most recent med list.          ALPRAZolam 1 MG tablet Commonly known as:  XANAX TAKE ONE-HALF TO 1 TABLET TWICE A DAY AS NEEDED FOR ANXIETY   atenolol 50 MG tablet Commonly known as:  TENORMIN Take 1/2 tablet ( 10m ) by mouth 2 times per day   dexamethasone 1 MG tablet Commonly known as:  DECADRON Take 1 tablet by mouth once at 11 pm, before coming for labs at 8 am the next morning   ELIQUIS 5 MG Tabs tablet Generic drug:  apixaban TAKE 1 TABLET BY MOUTH TWICE DAILY   folic acid 8676MCG tablet Commonly known as:  FOLVITE Take 800 mcg by mouth daily.   GAS-X PO Take 1 tablet by mouth 4 (four) times daily as needed (For gas.). Do not exceed 6 tablets in a 24 hour period.   IMODIUM A-D 2 MG tablet Generic drug:  loperamide Take 2 mg by mouth 4 (four) times daily as needed for diarrhea or loose stools.   PROBIOTIC DAILY PO Take 2  capsules by mouth daily with lunch.   SYSTANE OP Place 1 drop into both eyes daily as needed (dry eyes).   vitamin B-12 500 MCG tablet Commonly known as:  CYANOCOBALAMIN Take 500 mcg by mouth daily with lunch.   Vitamin D3 2000 units capsule Take 4,000 Units by mouth daily with lunch.       Allergies:  Allergies  Allergen Reactions  . Doxycycline Palpitations  . Hydrocodone Other (See Comments)    Hallucinations   . Sudafed [Pseudoephedrine Hcl] Shortness Of Breath  . Acular [Ketorolac Tromethamine]     Unknown reaction   . Diphenhydramine Hcl Swelling    Throat feels like it swells   . Lactose  Intolerance (Gi) Diarrhea and Other (See Comments)    bloating  . Mesalamine Other (See Comments)    Hair loss  . Sulfasalazine Diarrhea  . Azithromycin Palpitations    Speeding heart rate per pt.   . Caffeine Palpitations  . Penicillins Rash    Has patient had a PCN reaction causing immediate rash, facial/tongue/throat swelling, SOB or lightheadedness with hypotension: Yes Has patient had a PCN reaction causing severe rash involving mucus membranes or skin necrosis: No Has patient had a PCN reaction that required hospitalization Yes Has patient had a PCN reaction occurring within the last 10 years: No If [name] of the above answers are "NO", then may proceed with Cephalosporin use.  . Prednisone Palpitations    Past Medical History, Surgical history, Social history, and Family History were reviewed and updated.  Review of Systems: [name] other 10 point review of systems is negative.   Physical Exam:  weight is 323 lb 4 oz (146.6 kg) (abnormal). Her oral temperature is 98 F (36.7 C). Her blood pressure is 139/65 and her pulse is 66. Her respiration is 18.   Wt Readings from Last 3 Encounters:  05/30/17 (!) 323 lb 4 oz (146.6 kg)  05/18/17 (!) 317 lb 6.4 oz (144 kg)  05/16/17 (!) 317 lb (143.8 kg)    Ocular: Sclerae unicteric, pupils equal, round and reactive to light Ear-nose-throat: Oropharynx clear, dentition fair Lymphatic: No cervical, supraclavicular or axillary adenopathy Lungs no rales or rhonchi, good excursion bilaterally Heart regular rate and rhythm, no murmur appreciated Abd soft, nontender, positive bowel sounds, no liver or spleen tip palpated on exam, no fluid wave  MSK no focal spinal tenderness, no joint edema Neuro: non-focal, well-oriented, appropriate affect Breasts: Deferred   Lab Results  Component Value Date   WBC 7.8 05/30/2017   HGB 13.2 01/24/2017   HCT 41.0 05/30/2017   MCV 94.7 05/30/2017   PLT 258 05/30/2017   Lab Results  Component Value  Date   IRON 72 07/17/2013   IRONPCTSAT 24.5 07/17/2013   Lab Results  Component Value Date   RBC 4.33 05/30/2017   No results found for: KPAFRELGTCHN, LAMBDASER, [facility] Lab Results  Component Value Date   IGA 341 05/25/2015   No results found for: Odetta Pink, SPEI   Chemistry      Component Value Date/Time   NA 139 01/24/2017 0929   K 4.0 05/05/2017 1529   K 4.0 01/24/2017 0929   CL 105 01/05/2017 2055   CL 103 10/10/2016 1403   CL 105 09/09/2016 1332   CO2 24 01/24/2017 0929   BUN 9.5 01/24/2017 0929   CREATININE 0.8 01/24/2017 0929      Component Value Date/Time   CALCIUM 9.7 01/24/2017 0929   ALKPHOS  107 01/24/2017 0929   AST 18 01/24/2017 0929   ALT 16 01/24/2017 0929   BILITOT 0.31 01/24/2017 0929      Impression and Plan: Cassie [name] is a pleasant 57 yo caucasian female with history of PE on Eliquis 5 mg PO daily. She is taking this as prescribed and wants to stay on this same regimen despite her complaints about the medication.  We will continue to follow along with her and plan to see her back in another 6 months for follow-up and lab.  She will contact our office with any questions or concerns. We can certainly see him sooner if need be.   Laverna Peace, NP 1/22/201910:03 AM

## 2017-06-05 ENCOUNTER — Encounter: Payer: Self-pay | Admitting: Internal Medicine

## 2017-06-07 ENCOUNTER — Institutional Professional Consult (permissible substitution): Payer: 59 | Admitting: Internal Medicine

## 2017-06-09 ENCOUNTER — Other Ambulatory Visit: Payer: Self-pay | Admitting: Internal Medicine

## 2017-06-09 NOTE — Telephone Encounter (Signed)
Control database checked last refill: 05/07/2017 LOV: 05/16/2017

## 2017-06-12 ENCOUNTER — Other Ambulatory Visit: Payer: Self-pay | Admitting: Family

## 2017-07-24 ENCOUNTER — Other Ambulatory Visit: Payer: 59

## 2017-07-26 ENCOUNTER — Other Ambulatory Visit: Payer: 59

## 2017-07-26 DIAGNOSIS — E278 Other specified disorders of adrenal gland: Secondary | ICD-10-CM

## 2017-07-29 ENCOUNTER — Encounter: Payer: Self-pay | Admitting: Internal Medicine

## 2017-07-30 ENCOUNTER — Encounter: Payer: Self-pay | Admitting: Hematology & Oncology

## 2017-07-31 LAB — CATECHOLAMINES, FRACTIONATED, PLASMA
Catecholamines, Total: 435 pg/mL
Epinephrine: 39 pg/mL
Norepinephrine: 396 pg/mL

## 2017-07-31 LAB — CORTISOL-AM, BLOOD: Cortisol - AM: 1.6 ug/dL — ABNORMAL LOW

## 2017-08-01 DIAGNOSIS — D2361 Other benign neoplasm of skin of right upper limb, including shoulder: Secondary | ICD-10-CM | POA: Diagnosis not present

## 2017-08-01 DIAGNOSIS — L820 Inflamed seborrheic keratosis: Secondary | ICD-10-CM | POA: Diagnosis not present

## 2017-08-01 DIAGNOSIS — L821 Other seborrheic keratosis: Secondary | ICD-10-CM | POA: Diagnosis not present

## 2017-08-17 ENCOUNTER — Ambulatory Visit: Payer: 59 | Admitting: Internal Medicine

## 2017-08-17 ENCOUNTER — Encounter: Payer: Self-pay | Admitting: Internal Medicine

## 2017-08-17 VITALS — BP 142/74 | HR 65 | Ht 65.5 in | Wt 318.0 lb

## 2017-08-17 DIAGNOSIS — I493 Ventricular premature depolarization: Secondary | ICD-10-CM | POA: Insufficient documentation

## 2017-08-17 DIAGNOSIS — Q245 Malformation of coronary vessels: Secondary | ICD-10-CM

## 2017-08-17 DIAGNOSIS — R0609 Other forms of dyspnea: Secondary | ICD-10-CM

## 2017-08-17 DIAGNOSIS — I471 Supraventricular tachycardia, unspecified: Secondary | ICD-10-CM | POA: Insufficient documentation

## 2017-08-17 DIAGNOSIS — I491 Atrial premature depolarization: Secondary | ICD-10-CM | POA: Diagnosis not present

## 2017-08-17 DIAGNOSIS — R030 Elevated blood-pressure reading, without diagnosis of hypertension: Secondary | ICD-10-CM

## 2017-08-17 DIAGNOSIS — R002 Palpitations: Secondary | ICD-10-CM | POA: Diagnosis not present

## 2017-08-17 NOTE — Progress Notes (Signed)
Follow-up Outpatient Visit Date: 08/17/2017  Primary Care Provider: Hoyt Koch, MD [place] [id number]  Chief Complaint: Palpitations  HPI:  Cassie [name] is a 57 y.o. year-old female with history of palpitations and PVCs, chest pain with myocardial bridging in the mid LAD, pulmonary embolism on apixaban, ulcerative colitis, anxiety, vitamin B12 deficiency, vitamin D deficiency, and morbid obesity, who presents for follow-up of palpitations.  I last saw [name] in January, at which time she noted that her palpitations seemed a bit worse.  He had previously discussed sleep study, though this had not yet been performed (requeted to have it done at home, thought this was denied by her insurance due to her BMI).  Today, Cassie [name] reports that her palpitations seem to be more frequent than at our last visit.  Last weekend, she had a sustained episode during which her heart "went wacky.".  The episode lasted about 10-20 minutes before resolving spontaneously.  She believes her heart rate was around 120 bpm.  She continues to have episodic brief flutters lasting a few seconds at a time.  These can happen anywhere from once or twice a week to several times a day.  There are no accompanying symptoms.  Chronic exertional dyspnea is unchanged.  She denies chest pain and lightheadedness.  She has stable dependent edema.  She has not been able to exercise due to multiple joint and muscle pains.  She attributes much of this to side effects from apixaban.  She has not scheduled a sleep study yet.  Home blood pressures are typically well controlled with systolics in the 641R.  --------------------------------------------------------------------------------------------------  Cardiovascular History & Procedures: Cardiovascular Problems:  Dyspnea on exertion  Palpitations  Pulmonary embolism  Myocardial bridge  Risk Factors:  Coronary artery calcium on CT,  obesity, and sedentary lifestyle  Cath/PCI:  None  CV Surgery:  None  EP Procedures and Devices:  30-day event monitor (03/27/17): Predominantly sinus rhythm with PACs, PVCs, and brief paroxysmal atrial tachycardia. No sustained arrhythmia or prolonged pause.  48-hour Holter monitor: Predominantly sinus rhythm with rare PACs, PVCs, and very short atrial runs. No significant arrhythmia.  Non-Invasive Evaluation(s):  Cardiac CTA (11/25/16): Normal aorta. Trileaflet aortic valve without calcification. Right dominant coronary arteries with normal origins. LMCA is normal. LAD has mild calcified plaque in the proximal segment (0-25%). Mid LAD has a 12 mm intramyocardial bridge. CT FFR of distal LAD 0.8. LCx is nondominant and normal. RCA is large with minimal calcification and no significant stenosis. Coronary artery calcium score 54.  CTA chest (09/09/16): No PE. Coronary artery calcification involving the LAD is noted. No significant lung abnormalities.  TTE (07/04/16): Nromal LV size and contraction (LVEF 55-60%) with normal diastolic function. Normal RV size and function. No significant valvular abnormalities.  Pharmacologic MPI (01/01/04): Abnormal study without ischemia or scar. No significant EKG abnormalities. Normal LV size and function.  Recent CV Pertinent Labs: Lab Results  Component Value Date   CHOL 175 12/25/2015   HDL 40.30 12/25/2015   LDLCALC 114 (H) 12/25/2015   TRIG 103.0 12/25/2015   CHOLHDL 4 12/25/2015   BNP 54.4 01/05/2017   K 4.4 05/30/2017   K 4.0 01/24/2017   MG 2.1 02/07/2011   BUN 9 05/30/2017   BUN 9.5 01/24/2017   CREATININE 0.87 05/30/2017   CREATININE 0.8 01/24/2017    Past medical and surgical history were reviewed and updated in EPIC.  Current Meds  Medication Sig  . ALPRAZolam Duanne Moron)  1 MG tablet TAKE 1/2 TO 1 TABLET BY MOUTH TWICE A DAY AS NEEDED FOR ANXIETY  . atenolol (TENORMIN) 50 MG tablet Take 1/2 tablet ( 58m ) by mouth 2 times per  day  . Cholecalciferol (VITAMIN D3) 2000 units capsule Take 4,000 Units by mouth daily with lunch.  .Marland KitchenELIQUIS 5 MG TABS tablet TAKE 1 TABLET BY MOUTH TWICE DAILY  . folic acid (FOLVITE) 8811MCG tablet Take 800 mcg by mouth daily.  .Marland Kitchenloperamide (IMODIUM A-D) 2 MG tablet Take 2 mg by mouth 4 (four) times daily as needed for diarrhea or loose stools.  .Vladimir FasterGlycol-Propyl Glycol (SYSTANE OP) Place 1 drop into both eyes daily as needed (dry eyes).  . Probiotic Product (PROBIOTIC DAILY PO) Take 2 capsules by mouth daily with lunch.   . Simethicone (GAS-X PO) Take 1 tablet by mouth 4 (four) times daily as needed (For gas.). Do not exceed 6 tablets in a 24 hour period.  . vitamin B-12 (CYANOCOBALAMIN) 500 MCG tablet Take 500 mcg by mouth daily with lunch.    Allergies: Doxycycline; Hydrocodone; Sudafed [pseudoephedrine hcl]; Acular [ketorolac tromethamine]; Diphenhydramine hcl; Lactose intolerance (gi); Mesalamine; Sulfasalazine; Azithromycin; Caffeine; Penicillins; and Prednisone  Social History   Socioeconomic History  . Marital status: Married    Spouse name: Not on file  . Number of children: 0  . Years of education: Not on file  . Highest education level: Not on file  Occupational History  . Occupation: HArboriculturist UNEMPLOYED  Social Needs  . Financial resource strain: Not on file  . Food insecurity:    Worry: Not on file    Inability: Not on file  . Transportation needs:    Medical: Not on file    Non-medical: Not on file  Tobacco Use  . Smoking status: Former Smoker    Types: Cigarettes    Last attempt to quit: 03/07/1979    Years since quitting: 38.4  . Smokeless tobacco: Never Used  Substance and Sexual Activity  . Alcohol use: No    Alcohol/week: 0.0 oz  . Drug use: No  . Sexual activity: Yes    Partners: Male  Lifestyle  . Physical activity:    Days per week: Not on file    Minutes per session: Not on file  . Stress: Not on file  Relationships  .  Social connections:    Talks on phone: Not on file    Gets together: Not on file    Attends religious service: Not on file    Active member of club or organization: Not on file    Attends meetings of clubs or organizations: Not on file    Relationship status: Not on file  . Intimate partner violence:    Fear of current or ex partner: Not on file    Emotionally abused: Not on file    Physically abused: Not on file    Forced sexual activity: Not on file  Other Topics Concern  . Not on file  Social History Narrative   Married: 0 kids   Regular exercise: walks 2 times a week   Caffeine use: none    Family History  Problem Relation Age of Onset  . Heart disease Father 760      pacemaker  . Atrial fibrillation Father   . Hypertension Maternal Aunt   . Hypertension Maternal Grandfather   . Colon cancer Neg Hx   . Stomach cancer Neg Hx   .  Colon polyps Neg Hx   . Esophageal cancer Neg Hx   . Rectal cancer Neg Hx     Review of Systems: A 12-system review of systems was performed and was negative except as noted in the HPI.  --------------------------------------------------------------------------------------------------  Physical Exam: BP (!) 142/74   Pulse 65   Ht 5' 5.5" (1.664 m)   Wt (!) 318 lb (144.2 kg)   BMI 52.11 kg/m   General: Morbidly obese woman, seated comfortably in the exam room.  She is accompanied by her husband. HEENT: No conjunctival pallor or scleral icterus. Moist mucous membranes.  OP clear. Neck: Supple without lymphadenopathy, thyromegaly, JVD, or HJR. Lungs: Normal work of breathing. Clear to auscultation bilaterally without wheezes or crackles. Heart: Regular rate and rhythm without murmurs, rubs, or gallops. Non-displaced PMI. Abd: Bowel sounds present. Soft, NT/ND without hepatosplenomegaly Ext: No lower extremity edema. Radial, PT, and DP pulses are 2+ bilaterally. Skin: Warm and dry without rash.  EKG: Normal sinus rhythm without  abnormalities.  Lab Results  Component Value Date   WBC 7.8 05/30/2017   HGB 13.2 01/24/2017   HCT 41.0 05/30/2017   MCV 94.7 05/30/2017   PLT 258 05/30/2017    Lab Results  Component Value Date   NA 142 05/30/2017   K 4.4 05/30/2017   CL 108 05/30/2017   CO2 26 05/30/2017   BUN 9 05/30/2017   CREATININE 0.87 05/30/2017   GLUCOSE 108 05/30/2017   ALT 14 05/30/2017    Lab Results  Component Value Date   CHOL 175 12/25/2015   HDL 40.30 12/25/2015   LDLCALC 114 (H) 12/25/2015   TRIG 103.0 12/25/2015   CHOLHDL 4 12/25/2015    --------------------------------------------------------------------------------------------------  ASSESSMENT AND PLAN: Palpitations with PACs, PVCs, and PAT Symptoms seem to be more severe than at our last visit.  Prior Holter and event monitors have shown isolated PACs and PVCs as well as brief runs of atrial tachycardia.  No sustained arrhythmias were identified.  We discussed referral to electrophysiology for further evaluation and management, as I am hesitant to escalate Ms. Weichel's beta-blocker further due to low resting heart rate.  She declined EP evaluation at this time.  She should contact us if she changes her mind or has worsening symptoms.  No medication changes otherwise.  Myocardial bridge No further chest pain.  Chronic exertional dyspnea is likely multifactorial, and I doubt it is primarily due to her myocardial bridge.  Continue current dose of atenolol.  Dyspnea on exertion I suspect this is most likely due to deconditioning and morbid obesity.  Some lingering effects from her pulmonary embolism are a consideration, though this was small and there is no evidence of elevated pulmonary artery pressure by echo.  Underlying sleep apnea is likely also contributing.  Ms. Lippman is now agreeable to a sleep study in the sleep lab.  We will move forward with arranging this.  Elevated blood pressure Blood pressure mildly elevated today but  typically better at home.  There may be some element of whitecoat hypertension.  I have encouraged her to exercise and lose weight as well as to proceed with sleep study to assess for sleep apnea.  Morbid obesity Weight is stable today.  Ms. Caniglia reports that she is not able to exercise well because of myalgias, joint pain, and back pain.  She attributes much of this to apixaban.  Though this is an unusual side effect, I encouraged her to speak with Dr. Marin Olp about alternative therapies (rivaroxaban  or warfarin) as well as a brief holiday from apixaban to see if her symptoms improve.  Ultimately, I think that many of Ms. Tango's complaints would improve with weight loss and exercise.  Follow-up: Return to clinic in 6 months.  Nelva Bush, MD 08/17/2017 1:44 PM

## 2017-08-17 NOTE — Telephone Encounter (Signed)
Informed patient of upcoming home sleep study and patient understanding was verbalized. Patient is scheduled for home sleep study . Information sent to [facility] sleep lab. [facility] will contact our office if any more information is needed. Marland Kitchen

## 2017-08-17 NOTE — Patient Instructions (Signed)
Medication Instructions:  Your physician recommends that you continue on your current medications as directed. Please refer to the Current Medication list given to you today.  Labwork: None  Testing/Procedures: None  Follow-Up: Your physician wants you to follow-up in: 6 months with Dr. Saunders Revel.  You will receive a reminder letter in the mail two months in advance. If you don't receive a letter, please call our office to schedule the follow-up appointment.   Any Other Special Instructions Will Be Listed Below (If Applicable).     If you need a refill on your cardiac medications before your next appointment, please call your pharmacy.

## 2017-08-21 ENCOUNTER — Telehealth: Payer: Self-pay | Admitting: *Deleted

## 2017-08-21 DIAGNOSIS — G4733 Obstructive sleep apnea (adult) (pediatric): Secondary | ICD-10-CM

## 2017-08-21 DIAGNOSIS — R5382 Chronic fatigue, unspecified: Secondary | ICD-10-CM

## 2017-08-21 NOTE — Telephone Encounter (Signed)
-----   Message from Nelva Bush, MD sent at 08/17/2017  6:23 PM EDT ----- Regarding: RE: Home sleep study I am fine with that, though I believe that we have already tried this and it was denied by her insurance due to her BMI.  I will support whatever we can get for her.  Thanks for your help.  [name]  ----- Message ----- From: Freada Bergeron, CMA Sent: 08/17/2017   6:02 PM To: Nelva Bush, MD, Frederik Schmidt, RN Subject: Home sleep study                               Patient would like to have a home sleep study. Is this ok? Thanks

## 2017-08-22 NOTE — Telephone Encounter (Signed)
Patient is aware and agreeable to Home Sleep Study through [facility]. Patient is scheduled for 09/19/16 at 1 pm to pick up home sleep kit and meet with Respiratory therapist at [facility]. Patient is aware that if this appointment date and time does not work for them they should contact Artis Delay directly at [phone]. Patient is aware that a sleep packet will be sent from [facility] in week. Patient wanted to be sure she would not have to pay a patient portion. I spoke with pre cert and patient was advised to call her insurance carrier for tha information. Patient is agreeable to treatment and thankful for call.

## 2017-08-29 ENCOUNTER — Encounter: Payer: Self-pay | Admitting: Hematology & Oncology

## 2017-09-04 ENCOUNTER — Encounter: Payer: Self-pay | Admitting: Hematology & Oncology

## 2017-09-05 ENCOUNTER — Encounter: Payer: Self-pay | Admitting: Internal Medicine

## 2017-09-05 ENCOUNTER — Encounter: Payer: Self-pay | Admitting: Hematology & Oncology

## 2017-09-10 ENCOUNTER — Encounter: Payer: Self-pay | Admitting: Internal Medicine

## 2017-09-15 ENCOUNTER — Encounter (HOSPITAL_BASED_OUTPATIENT_CLINIC_OR_DEPARTMENT_OTHER): Payer: 59

## 2017-09-19 ENCOUNTER — Ambulatory Visit (HOSPITAL_BASED_OUTPATIENT_CLINIC_OR_DEPARTMENT_OTHER): Payer: 59

## 2017-09-19 ENCOUNTER — Ambulatory Visit (HOSPITAL_BASED_OUTPATIENT_CLINIC_OR_DEPARTMENT_OTHER): Payer: 59 | Attending: Internal Medicine | Admitting: Cardiology

## 2017-09-19 ENCOUNTER — Encounter (HOSPITAL_BASED_OUTPATIENT_CLINIC_OR_DEPARTMENT_OTHER): Payer: 59

## 2017-09-19 DIAGNOSIS — G4733 Obstructive sleep apnea (adult) (pediatric): Secondary | ICD-10-CM | POA: Insufficient documentation

## 2017-09-19 DIAGNOSIS — G4736 Sleep related hypoventilation in conditions classified elsewhere: Secondary | ICD-10-CM | POA: Diagnosis not present

## 2017-09-19 DIAGNOSIS — R5382 Chronic fatigue, unspecified: Secondary | ICD-10-CM

## 2017-09-19 DIAGNOSIS — R5383 Other fatigue: Secondary | ICD-10-CM | POA: Diagnosis present

## 2017-09-20 ENCOUNTER — Other Ambulatory Visit: Payer: Self-pay | Admitting: Internal Medicine

## 2017-09-21 IMAGING — DX DG CHEST 2V
2 series · 2 of 2 positions shown · non-contrast
Comparison: 07/15/2016, 10/10/2016.

CLINICAL DATA: Chronic dyspnea for months. Intermittent left-sided
chest pain recently.

EXAM:
CHEST  2 VIEW

[chest pa]
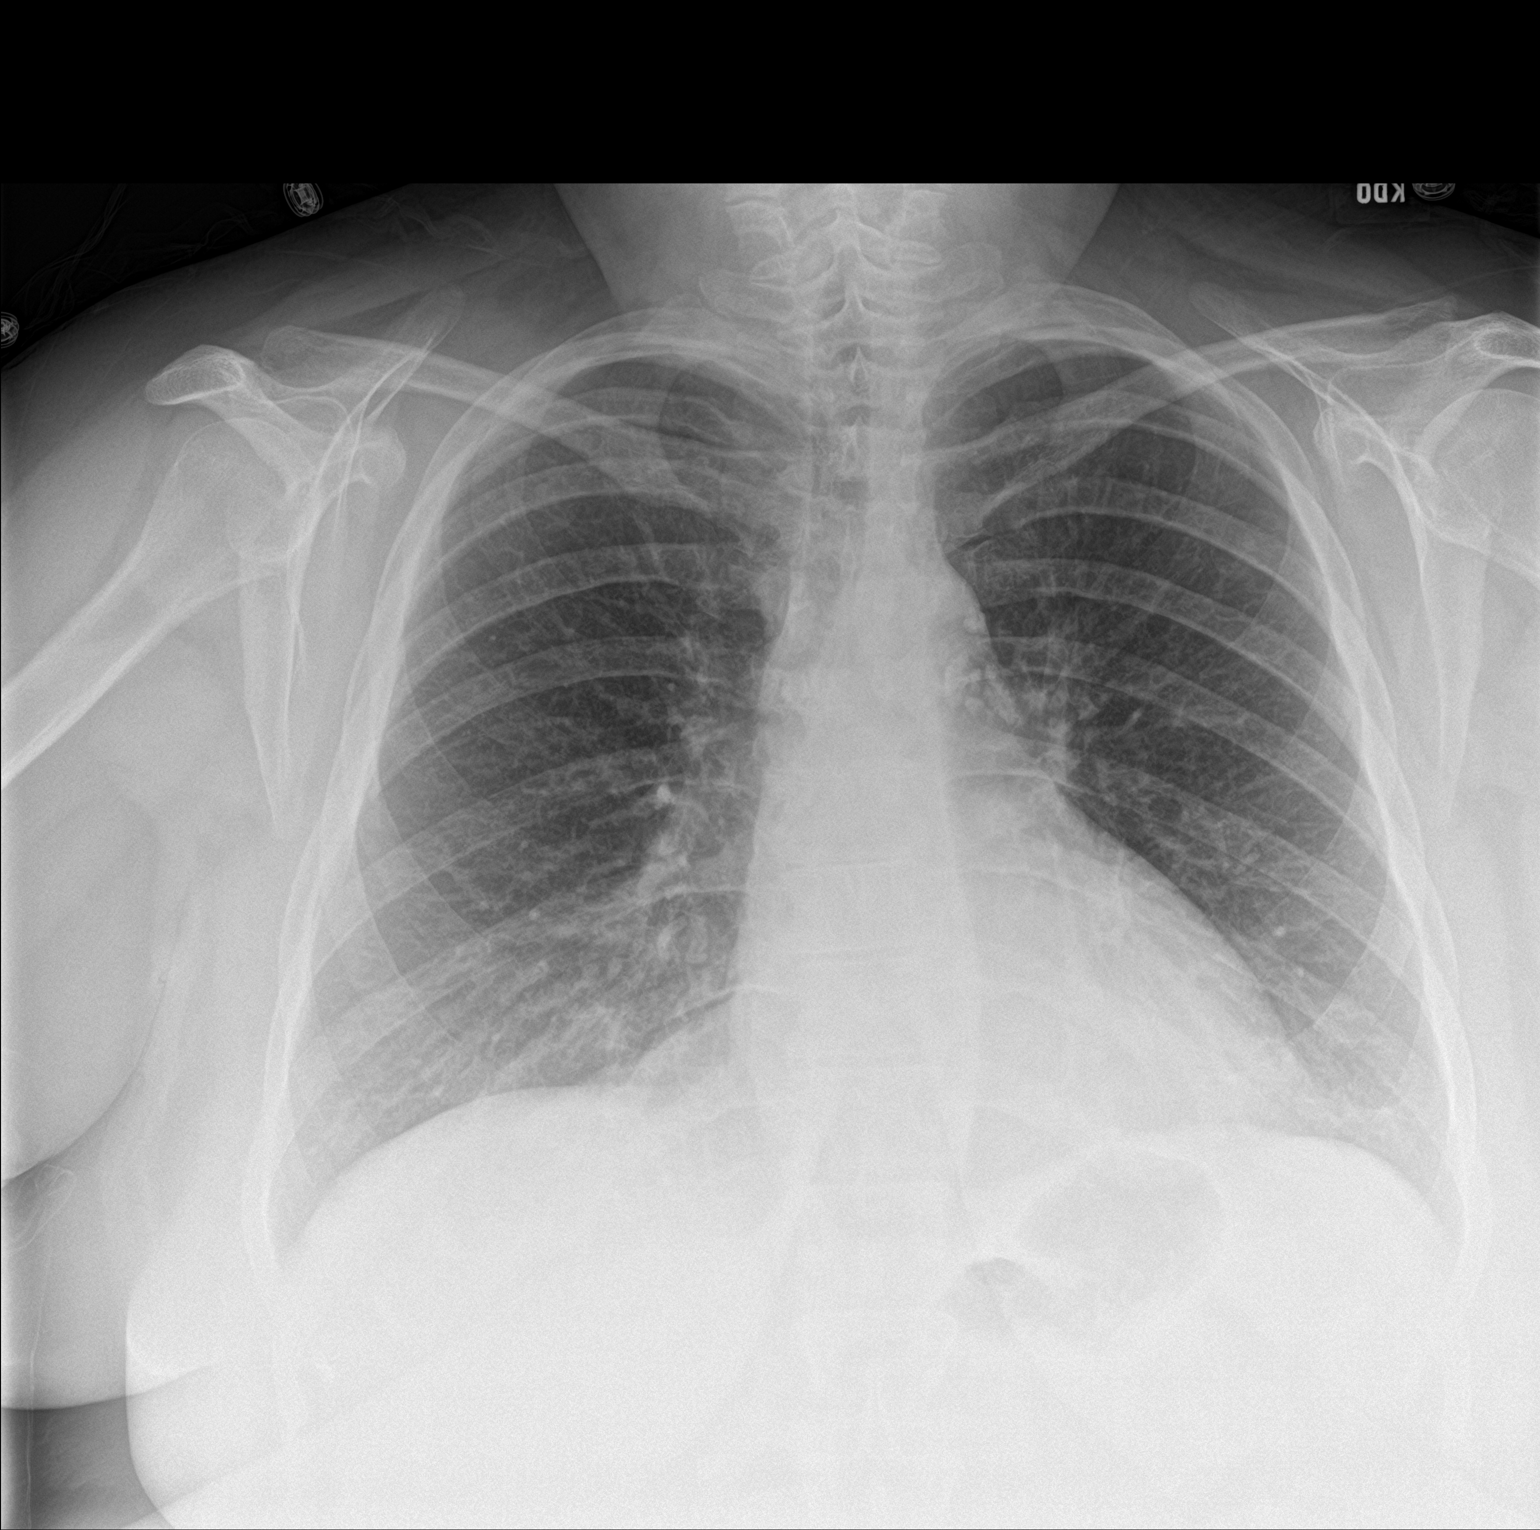

[chest lat]
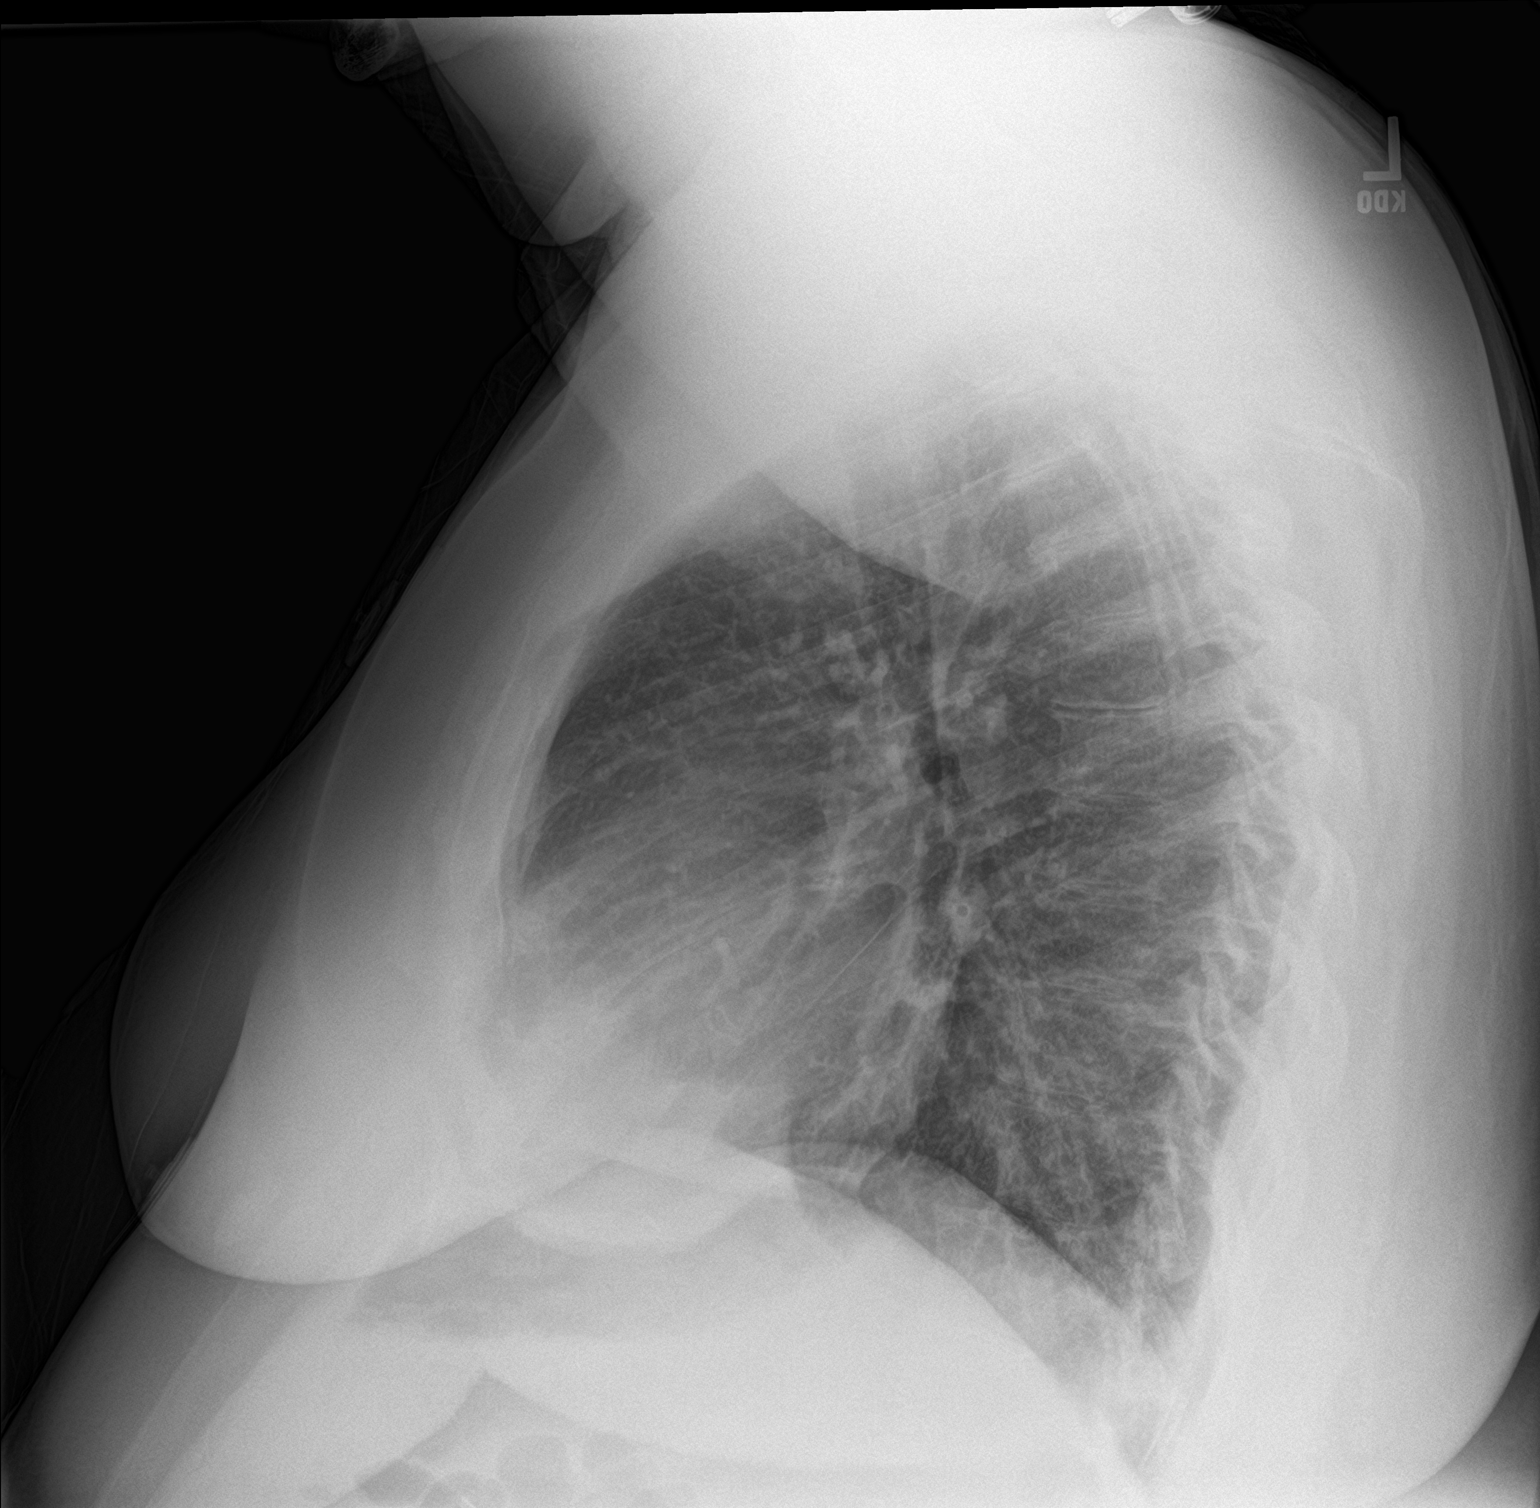

[2 of 2 positions shown; findings below may reference images not displayed]

FINDINGS: The lungs are clear. The pulmonary vasculature is normal. Heart size
is normal. Hilar and mediastinal contours are unremarkable. There is
no pleural effusion.
IMPRESSION: No active cardiopulmonary disease.

## 2017-09-21 NOTE — Telephone Encounter (Signed)
Control database checked last refill: 07/14/2017 LOV: 05/16/2017

## 2017-09-21 NOTE — Procedures (Signed)
   Patient Name: Cassie [name], Cassie [name] Date: 09/19/2017 Gender: Female D.O.B: 02/05/1961 Age (years): 66 Referring Provider: Nelva Bush MD Height (inches): 65 Interpreting Physician: Fransico Him MD, ABSM Weight (lbs): 318 RPSGT: Jonna Coup BMI: 53 MRN: [id number] Neck Size: 17.00  CLINICAL INFORMATION Sleep Study Type: HST  Indication for sleep study: Fatigue, OSA  Epworth Sleepiness Score: NA  SLEEP STUDY TECHNIQUE A multi-channel overnight portable sleep study was performed. The channels recorded were: nasal airflow, thoracic respiratory movement, and oxygen saturation with a pulse oximetry. Snoring was also monitored.  MEDICATIONS Patient self administered medications include: N/A.  SLEEP ARCHITECTURE Patient was studied for 512 minutes. The sleep efficiency was 100.0 % and the patient was supine for 98.6%. The arousal index was 0.0 per hour.  RESPIRATORY PARAMETERS The overall AHI was 6.9 per hour, with a central apnea index of 0.0 per hour.  The oxygen nadir was 81% during sleep  CARDIAC DATA Mean heart rate during sleep was 52.9 bpm.  IMPRESSIONS - Mild obstructive sleep apnea occurred during this study (AHI = 6.9/h). - No significant central sleep apnea occurred during this study (CAI = 0.0/h). - Moderate oxygen desaturation was noted during this study (Min O2 = 81%). - Patient snored 9.5% during the sleep.  DIAGNOSIS - Obstructive Sleep Apnea (327.23 [G47.33 ICD-10]) - Nocturnal Hypoxemia (327.26 [G47.36 ICD-10])  RECOMMENDATIONS - Therapeutic CPAP titration to determine optimal pressure required to alleviate sleep disordered breathing. - Avoid alcohol, sedatives and other CNS depressants that may worsen sleep apnea and disrupt normal sleep architecture. - Sleep hygiene should be reviewed to assess factors that may improve sleep quality. - Weight management and regular exercise should be initiated or continued.  [Electronically signed]  09/21/2017 07:21 PM  Fransico Him MD, ABSM Diplomate, American Board of Sleep Medicine

## 2017-09-22 ENCOUNTER — Telehealth: Payer: Self-pay | Admitting: *Deleted

## 2017-09-22 DIAGNOSIS — G4733 Obstructive sleep apnea (adult) (pediatric): Secondary | ICD-10-CM

## 2017-09-22 NOTE — Telephone Encounter (Signed)
Informed patient of sleep study results and patient understanding was verbalized. Patient understands her sleep study showed she they have sleep apnea and recommend CPAP titration. Orders sent to precert

## 2017-09-22 NOTE — Telephone Encounter (Signed)
-----   Message from Sueanne Margarita, MD sent at 09/21/2017  7:25 PM EDT ----- Please let patient know that they have sleep apnea and recommend CPAP titration. Please set up titration in the sleep lab.

## 2017-09-25 ENCOUNTER — Encounter: Payer: Self-pay | Admitting: Internal Medicine

## 2017-10-16 ENCOUNTER — Telehealth: Payer: Self-pay

## 2017-10-16 ENCOUNTER — Encounter: Payer: Self-pay | Admitting: Internal Medicine

## 2017-10-16 ENCOUNTER — Telehealth: Payer: Self-pay | Admitting: Internal Medicine

## 2017-10-16 ENCOUNTER — Other Ambulatory Visit: Payer: Self-pay

## 2017-10-16 MED ORDER — FUROSEMIDE 20 MG PO TABS: ORAL_TABLET | ORAL | 2 refills | Status: DC

## 2017-10-16 NOTE — Telephone Encounter (Signed)
Telephoned patient and gave her Dr Darnelle Bos recommendation.  She verbalized understanding.

## 2017-10-16 NOTE — Telephone Encounter (Signed)
Pt calling requesting a refill on Furosemide. This medication is not on pt's medication list. Pt stated that Dr. Saunders Revel prescribed her 10 pills to take as needed. Pt would like a call back concerning this matter. Please address

## 2017-10-16 NOTE — Telephone Encounter (Signed)
I had previously recommended furosemide 20 mg daily.  Please sent in a prescription for furosemide 20 mg daily as needed for edema.  If she is needing to use this on a regular basis, she should let us know.  She should minimize her salt intake as well.  Nelva Bush, MD [facility] Pager: [phone]

## 2017-10-16 NOTE — Telephone Encounter (Signed)
Spoke with patient who was prescribed Furosemide back on 1/10, I do not see on medication list.  She was given 10 pills to use PRN, but she may have tossed them in the garbage and was wondering if you could prescribe another 10, because she is now having edema in her ankles.  Please advise, thank you.

## 2017-10-16 NOTE — Telephone Encounter (Signed)
°*  STAT* If patient is at the pharmacy, call can be transferred to refill team.   1. Which medications need to be refilled? (please list name of each medication and dose if known) Furosemide-misplaced her pills  2. Which pharmacy/location (including street and city if local pharmacy) is medication to be sent to?[facility] and [address]  3. Do they need a 30 day or 90 day supply? 10

## 2017-10-16 NOTE — Telephone Encounter (Signed)
Follow Up:     Pt calling,would like her Home Sleep Study results please. She need somebody to explain the results to her.

## 2017-10-19 ENCOUNTER — Encounter: Payer: Self-pay | Admitting: Internal Medicine

## 2017-10-19 NOTE — Telephone Encounter (Signed)
Patient will send a message on mychart to Dr Louisa Second  and will ask them to forward her questions to Dr Radford Pax as I have answered her questions to the best of my ability.

## 2017-11-07 IMAGING — US US EXTREM LOW VENOUS*R*
1 series · 13 of 24 positions shown · non-contrast
Comparison: None.

CLINICAL DATA: Right lower extremity pain.  History of PE.



[Series 1: us extrem low venous*right* · 0.09mm/px · 24 acquisitions, 13 frames shown]
[im 1/24]
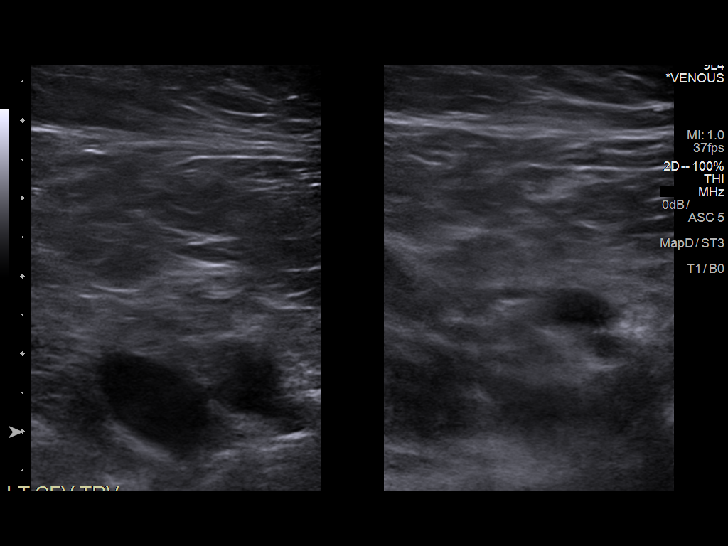
[im 3/24]
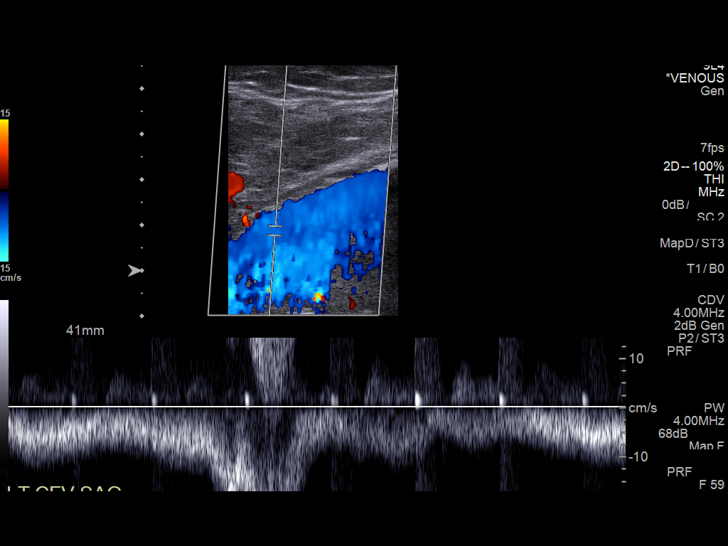
[im 5/24]
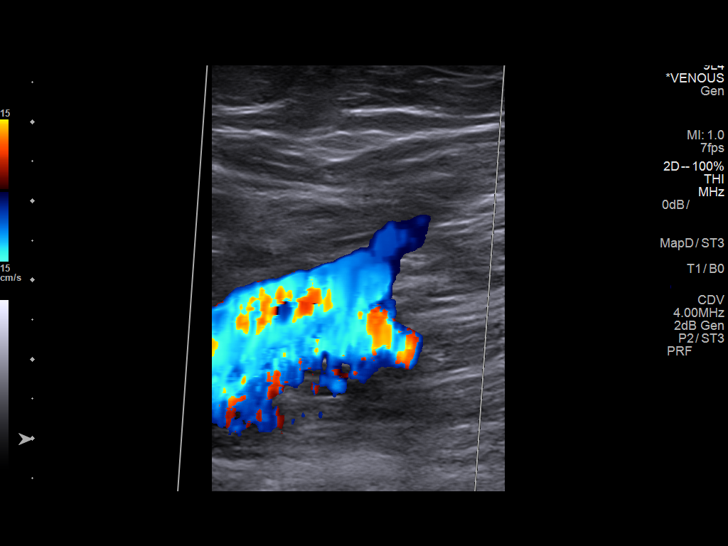
[im 7/24]
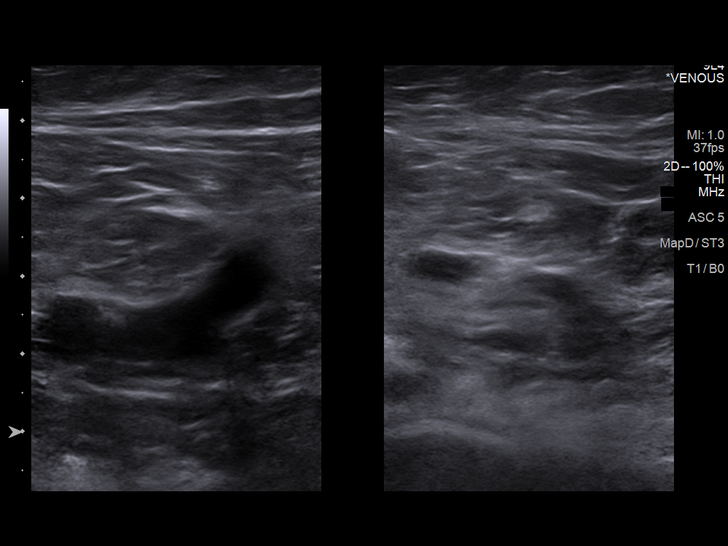
[im 9/24]
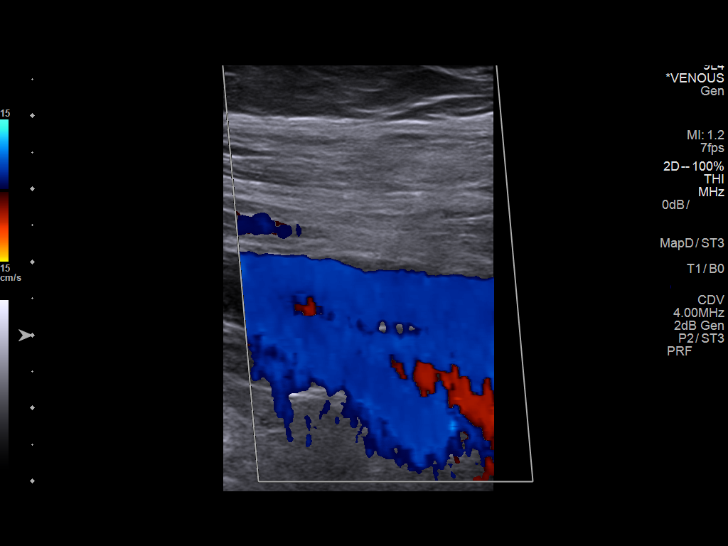
[im 11/24]
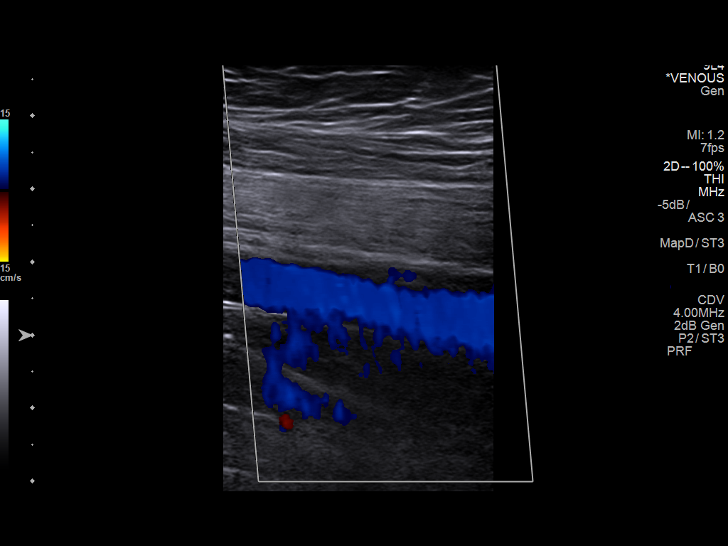
[im 14/24]
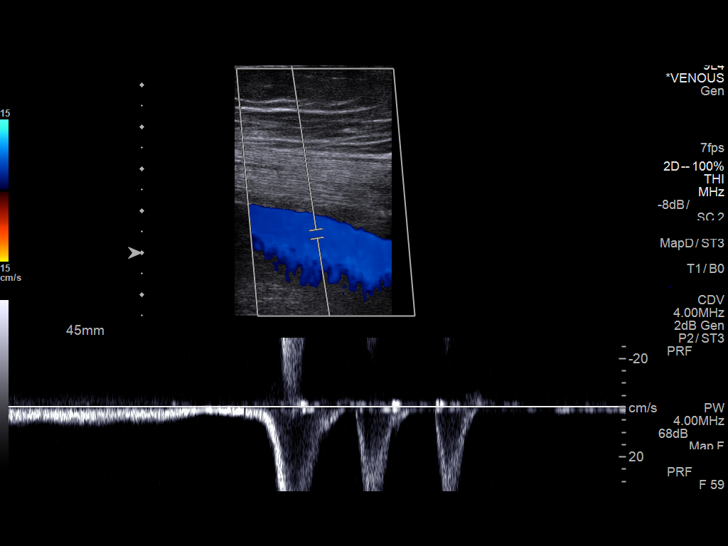
[im 15/24]
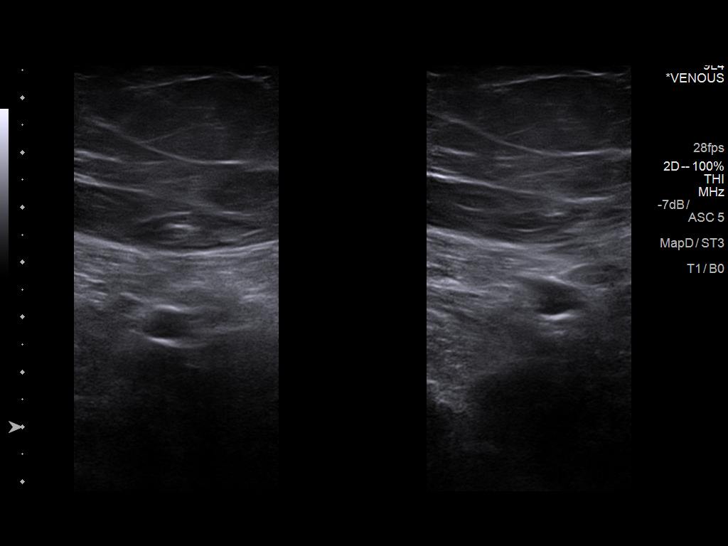
[im 17/24]
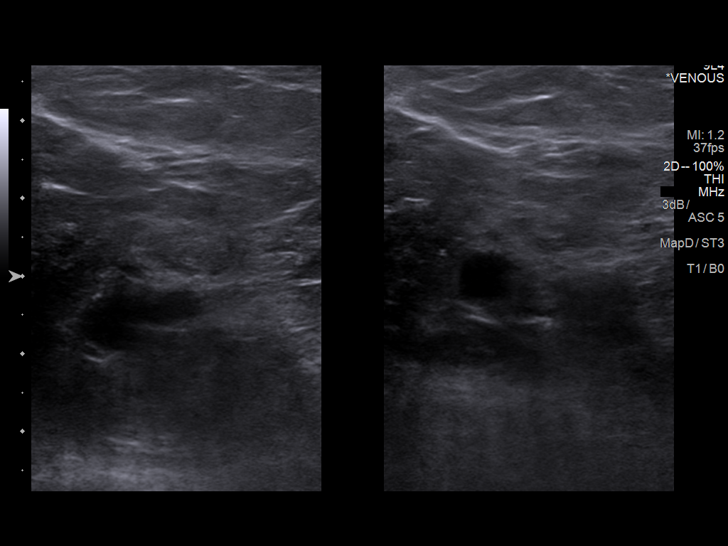
[im 19/24]
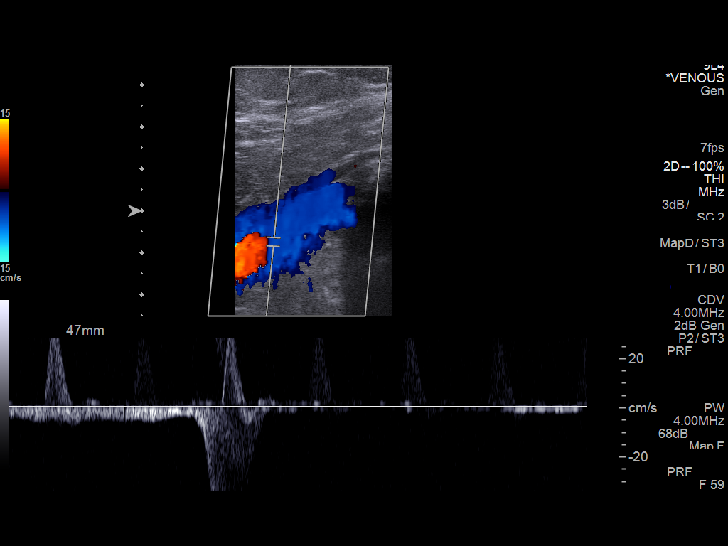
[im 21/24]
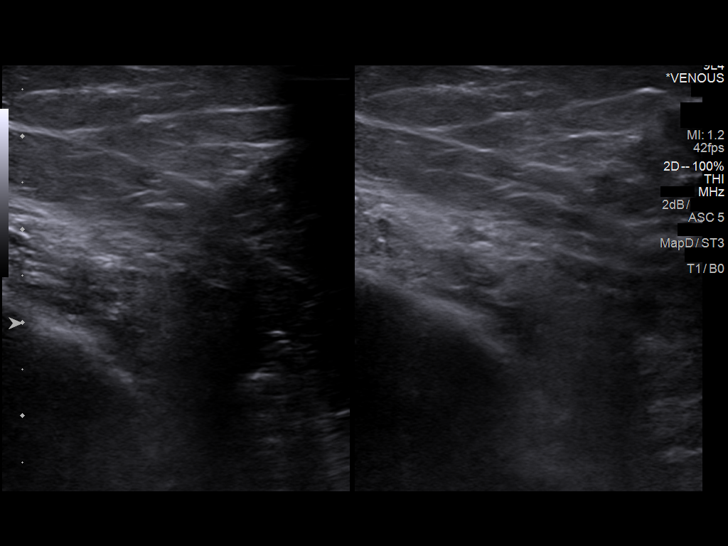
[im 22/24]
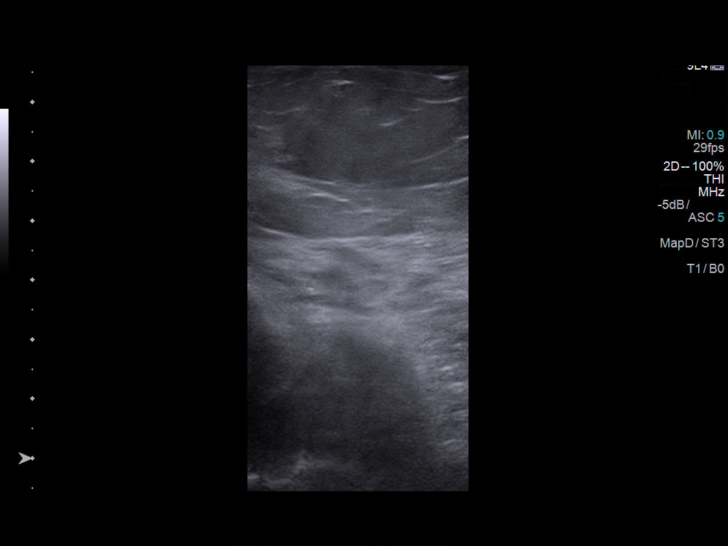
[im 24/24]
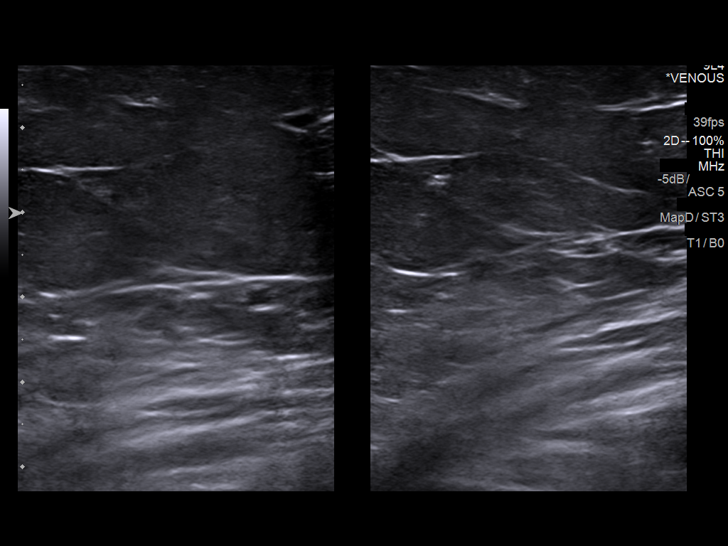

[13 of 24 positions shown; findings below may reference images not displayed]

FINDINGS: Contralateral Common Femoral Vein: Respiratory phasicity is normal
and symmetric with the symptomatic side. No evidence of thrombus.
Normal compressibility.

Common Femoral Vein: No evidence of thrombus. Normal
compressibility, respiratory phasicity and response to augmentation.

Saphenofemoral Junction: No evidence of thrombus. Normal
compressibility and flow on color Doppler imaging.

Profunda Femoral Vein: No evidence of thrombus. Normal
compressibility and flow on color Doppler imaging.

Femoral Vein: No evidence of thrombus. Normal compressibility,
respiratory phasicity and response to augmentation.

Popliteal Vein: No evidence of thrombus. Normal compressibility,
respiratory phasicity and response to augmentation.

Calf Veins: No evidence of thrombus. Normal compressibility and flow
on color Doppler imaging.

Superficial Great Saphenous Vein: No evidence of thrombus. Normal
compressibility and flow on color Doppler imaging.

Venous Reflux:  None.

Other Findings:  None.
IMPRESSION: No evidence of DVT within the right lower extremity. The right
peroneal vein was not visualized.

## 2017-11-14 ENCOUNTER — Telehealth: Payer: Self-pay | Admitting: *Deleted

## 2017-11-14 NOTE — Telephone Encounter (Signed)
PA submitted to [facility] for in lab CPAP titration.

## 2017-11-14 NOTE — Telephone Encounter (Signed)
-----   Message from Freada Bergeron, [place] sent at 11/10/2017  4:35 PM EDT ----- Regarding: precert May be a duplicate cpap titration

## 2017-11-20 ENCOUNTER — Telehealth: Payer: Self-pay | Admitting: *Deleted

## 2017-11-20 NOTE — Telephone Encounter (Signed)
Staff message sent to [place]. Approval received from [facility]. Ok to schedule in lab CPAP titration. Authorization information provided to Gae Bon to assign referral.

## 2017-11-20 NOTE — Telephone Encounter (Signed)
-----   Message from Freada Bergeron, [place] sent at 11/10/2017  4:35 PM EDT ----- Regarding: precert May be a duplicate cpap titration

## 2017-11-23 ENCOUNTER — Encounter: Payer: Self-pay | Admitting: Internal Medicine

## 2017-11-24 ENCOUNTER — Telehealth: Payer: Self-pay | Admitting: *Deleted

## 2017-11-24 NOTE — Telephone Encounter (Addendum)
Patient is scheduled for lab Titration on 12/1917. Patient understands her lab study will be done at [facility] sleep lab. Patient understands she will receive a sleep packet in a week or so. Patient understands to call if she does not receive the sleep packet in a timely manner. Patient states she will call to reschedule the date for her titration Patient agrees with treatment and thanked me for call.

## 2017-11-24 NOTE — Telephone Encounter (Signed)
-----   Message from Lauralee Evener, [place] sent at 11/20/2017 10:47 AM EDT ----- Regarding: RE: precert Approval received from [facility]. Ok to schedule CPAP titration,  Auth # [id number]  VALID 11/15/17 TO 05/08/18 assign referral. ----- Message ----- From: Freada Bergeron, CMA Sent: 11/10/2017   4:35 PM To: [facility] Subject: precert                                        May be a duplicate cpap titration

## 2017-11-27 ENCOUNTER — Encounter: Payer: Self-pay | Admitting: Internal Medicine

## 2017-11-27 ENCOUNTER — Other Ambulatory Visit: Payer: Self-pay

## 2017-11-27 ENCOUNTER — Inpatient Hospital Stay: Payer: 59 | Attending: Family | Admitting: Family

## 2017-11-27 ENCOUNTER — Encounter: Payer: Self-pay | Admitting: Family

## 2017-11-27 ENCOUNTER — Inpatient Hospital Stay: Payer: 59

## 2017-11-27 VITALS — BP 122/47 | HR 59 | Temp 97.7°F | Resp 18 | Wt 323.0 lb

## 2017-11-27 DIAGNOSIS — R0609 Other forms of dyspnea: Secondary | ICD-10-CM | POA: Insufficient documentation

## 2017-11-27 DIAGNOSIS — Z86711 Personal history of pulmonary embolism: Secondary | ICD-10-CM | POA: Diagnosis not present

## 2017-11-27 DIAGNOSIS — Z7901 Long term (current) use of anticoagulants: Secondary | ICD-10-CM | POA: Diagnosis not present

## 2017-11-27 DIAGNOSIS — I2699 Other pulmonary embolism without acute cor pulmonale: Secondary | ICD-10-CM

## 2017-11-27 DIAGNOSIS — I82401 Acute embolism and thrombosis of unspecified deep veins of right lower extremity: Secondary | ICD-10-CM

## 2017-11-27 LAB — CMP (CANCER CENTER ONLY)
ALBUMIN: 3.3 g/dL — AB (ref 3.5–5.0)
ALK PHOS: 104 U/L (ref 38–126)
ALT: 14 U/L (ref 0–44)
ANION GAP: 9 (ref 5–15)
AST: 19 U/L (ref 15–41)
BUN: 11 mg/dL (ref 6–20)
CALCIUM: 9.5 mg/dL (ref 8.9–10.3)
CO2: 27 mmol/L (ref 22–32)
Chloride: 103 mmol/L (ref 98–111)
Creatinine: 0.81 mg/dL (ref 0.44–1.00)
GFR, Estimated: >60 mL/min (ref >60)
GLUCOSE: 100 mg/dL — AB (ref 70–99)
Potassium: 4 mmol/L (ref 3.5–5.1)
Sodium: 139 mmol/L (ref 135–145)
Total Bilirubin: 0.3 mg/dL (ref 0.3–1.2)
Total Protein: 7.4 g/dL (ref 6.5–8.1)

## 2017-11-27 LAB — CBC WITH DIFFERENTIAL (CANCER CENTER ONLY)
BASOS ABS: 0 10*3/uL (ref 0.0–0.1)
Basophils Relative: 0 %
EOS PCT: 3 %
Eosinophils Absolute: 0.2 10*3/uL (ref 0.0–0.5)
HEMATOCRIT: 40.9 % (ref 34.8–46.6)
Hemoglobin: 12.9 g/dL (ref 11.6–15.9)
LYMPHS PCT: 28 %
Lymphs Abs: 1.9 10*3/uL (ref 0.9–3.3)
MCH: 29.8 pg (ref 26.0–34.0)
MCHC: 31.5 g/dL — ABNORMAL LOW (ref 32.0–36.0)
MCV: 94.5 fL (ref 81.0–101.0)
MONO ABS: 0.5 10*3/uL (ref 0.1–0.9)
Monocytes Relative: 8 %
Neutro Abs: 4.2 10*3/uL (ref 1.5–6.5)
Neutrophils Relative %: 61 %
PLATELETS: 244 10*3/uL (ref 145–400)
RBC: 4.33 MIL/uL (ref 3.70–5.32)
RDW: 13.5 % (ref 11.1–15.7)
WBC Count: 6.8 10*3/uL (ref 3.9–10.0)

## 2017-11-27 LAB — D-DIMER, QUANTITATIVE: D-Dimer, Quant: <0.27 ug/mL-FEU (ref 0.00–0.50)

## 2017-11-27 NOTE — Progress Notes (Signed)
Hematology and Oncology Follow Up Visit  Cassie [name] [id number] Oct 03, 1960 57 y.o. 11/27/2017   Principle Diagnosis:  Idiopathic right lower lobe pulmonary embolism Ulcerative colitis  Current Therapy:   ELIQUIS 5 mg by mouth twice a day    Interim History:  Ms. Montella is here today with her husband for follow-up. She has gingivitis and needs to have scaling done to her gums as well as a tooth pulled. She also needs to have a recurrent cataract in the right eye lasered. She will let us know when these are scheduled and will gold her Eliquis 2 days before and restart the day after. She verbalized understanding.  She has some SOB with over exertion. She is scheduled for a sleep study and will be fitted for a CPAP if needed. She also has a new patient appointment coming up soon with pulmonology.  No fever, chills, n/v, rash, dizziness, chest pain, abdominal pain or changes in bowel or bladder habits.  She has occasional cough and palpitations.  She has had no recent colitis flares. No blood noted in her stool.  No bruising or petechiae. She verbalized that she is taking her Eliquis as prescribed.  She takes lasix as needed to reduce swelling in her lower extremities. The prickling feeling in her left thigh is unchanged.  She has a good appetite and is staying well hydrated. Her weight is stable.   ECOG Performance Status: 1 - Symptomatic but completely ambulatory  Medications:  Allergies as of 11/27/2017      Reactions   Doxycycline Palpitations   Hydrocodone Other (See Comments)   Hallucinations    Sudafed [pseudoephedrine Hcl] Shortness Of Breath   Acular [ketorolac Tromethamine]    Unknown reaction   Diphenhydramine Hcl Swelling   Throat feels like it swells    Lactose Intolerance (gi) Diarrhea, Other (See Comments)   bloating   Mesalamine Other (See Comments)   Hair loss   Sulfasalazine Diarrhea   Azithromycin Palpitations   Speeding heart rate per pt.   Caffeine  Palpitations   Penicillins Rash   Has patient had a PCN reaction causing immediate rash, facial/tongue/throat swelling, SOB or lightheadedness with hypotension: Yes Has patient had a PCN reaction causing severe rash involving mucus membranes or skin necrosis: No Has patient had a PCN reaction that required hospitalization Yes Has patient had a PCN reaction occurring within the last 10 years: No If all of the above answers are "NO", then may proceed with Cephalosporin use.   Prednisone Palpitations      Medication List        Accurate as of 11/27/17  8:58 AM. Always use your most recent med list.          ALPRAZolam 1 MG tablet Commonly known as:  XANAX TAKE 1/2 TO 1 TABLET BY MOUTH TWICE A DAY AS NEEDED FOR ANXIETY   atenolol 50 MG tablet Commonly known as:  TENORMIN Take 1/2 tablet ( 42m ) by mouth 2 times per day   ELIQUIS 5 MG Tabs tablet Generic drug:  apixaban TAKE 1 TABLET BY MOUTH TWICE DAILY   folic acid 8818MCG tablet Commonly known as:  FOLVITE Take 800 mcg by mouth daily.   furosemide 20 MG tablet Commonly known as:  LASIX Take 1 tablet (20 mg) daily as needed for swelling.   GAS-X PO Take 1 tablet by mouth 4 (four) times daily as needed (For gas.). Do not exceed 6 tablets in a 24 hour period.  IMODIUM A-D 2 MG tablet Generic drug:  loperamide Take 2 mg by mouth 4 (four) times daily as needed for diarrhea or loose stools.   PROBIOTIC DAILY PO Take 2 capsules by mouth daily with lunch.   SYSTANE OP Place 1 drop into both eyes daily as needed (dry eyes).   vitamin B-12 500 MCG tablet Commonly known as:  CYANOCOBALAMIN Take 500 mcg by mouth daily with lunch.   Vitamin D3 2000 units capsule Take 4,000 Units by mouth daily with lunch.       Allergies:  Allergies  Allergen Reactions  . Doxycycline Palpitations  . Hydrocodone Other (See Comments)    Hallucinations   . Sudafed [Pseudoephedrine Hcl] Shortness Of Breath  . Acular [Ketorolac  Tromethamine]     Unknown reaction   . Diphenhydramine Hcl Swelling    Throat feels like it swells   . Lactose Intolerance (Gi) Diarrhea and Other (See Comments)    bloating  . Mesalamine Other (See Comments)    Hair loss  . Sulfasalazine Diarrhea  . Azithromycin Palpitations    Speeding heart rate per pt.   . Caffeine Palpitations  . Penicillins Rash    Has patient had a PCN reaction causing immediate rash, facial/tongue/throat swelling, SOB or lightheadedness with hypotension: Yes Has patient had a PCN reaction causing severe rash involving mucus membranes or skin necrosis: No Has patient had a PCN reaction that required hospitalization Yes Has patient had a PCN reaction occurring within the last 10 years: No If all of the above answers are "NO", then may proceed with Cephalosporin use.  . Prednisone Palpitations    Past Medical History, Surgical history, Social history, and Family History were reviewed and updated.  Review of Systems: All other 10 point review of systems is negative.   Physical Exam:  weight is 323 lb (146.5 kg) (abnormal). Her oral temperature is 97.7 F (36.5 C). Her blood pressure is 122/47 (abnormal) and her pulse is 59 (abnormal). Her respiration is 18 and oxygen saturation is 100%.   Wt Readings from Last 3 Encounters:  11/27/17 (!) 323 lb (146.5 kg)  08/17/17 (!) 318 lb (144.2 kg)  05/30/17 (!) 323 lb 4 oz (146.6 kg)    Ocular: Sclerae unicteric, pupils equal, round and reactive to light Ear-nose-throat: Oropharynx clear, dentition fair Lymphatic: No cervical, supraclavicular or axillary adenopathy Lungs no rales or rhonchi, good excursion bilaterally Heart regular rate and rhythm, no murmur appreciated Abd soft, nontender, positive bowel sounds, no liver or spleen tip palpated on exam, no fluid wave  MSK no focal spinal tenderness, no joint edema Neuro: non-focal, well-oriented, appropriate affect Breasts: Deferred   Lab Results  Component  Value Date   WBC 6.8 11/27/2017   HGB 12.9 11/27/2017   HCT 40.9 11/27/2017   MCV 94.5 11/27/2017   PLT 244 11/27/2017   Lab Results  Component Value Date   IRON 72 07/17/2013   IRONPCTSAT 24.5 07/17/2013   Lab Results  Component Value Date   RBC 4.33 11/27/2017   No results found for: KPAFRELGTCHN, LAMBDASER, [facility] Lab Results  Component Value Date   IGA 341 05/25/2015   No results found for: Ronnald Ramp, A1GS, A2GS, Tillman Sers, SPEI   Chemistry      Component Value Date/Time   NA 142 05/30/2017 0812   NA 139 01/24/2017 0929   K 4.4 05/30/2017 0812   K 4.0 01/24/2017 0929   CL 108 05/30/2017 0812   CL 103 10/10/2016 1403  CL 105 09/09/2016 1332   CO2 26 05/30/2017 0812   CO2 24 01/24/2017 0929   BUN 9 05/30/2017 0812   BUN 9.5 01/24/2017 0929   CREATININE 0.87 05/30/2017 0812   CREATININE 0.8 01/24/2017 0929      Component Value Date/Time   CALCIUM 9.3 05/30/2017 0812   CALCIUM 9.7 01/24/2017 0929   ALKPHOS 108 05/30/2017 0812   ALKPHOS 107 01/24/2017 0929   AST 13 05/30/2017 0812   AST 18 01/24/2017 0929   ALT 14 05/30/2017 0812   ALT 16 01/24/2017 0929   BILITOT 0.3 05/30/2017 0812   BILITOT 0.31 01/24/2017 0929      Impression and Plan: Ms. Kucinski is a pleasant 57 yo caucasian female with history of PE. She continues to do well on Eliquis 5 mg PO BID. She will continue her same regimen.  We will plan to see her back in another 6 months for follow-up.  They will contact our office with any questions or concerns. We can certainly see her sooner if need be.   Laverna Peace, NP 7/22/20198:58 AM

## 2017-11-29 ENCOUNTER — Telehealth: Payer: Self-pay

## 2017-11-29 ENCOUNTER — Encounter: Payer: Self-pay | Admitting: Family

## 2017-11-29 ENCOUNTER — Other Ambulatory Visit: Payer: Self-pay

## 2017-11-29 DIAGNOSIS — R0602 Shortness of breath: Secondary | ICD-10-CM

## 2017-11-29 NOTE — Telephone Encounter (Signed)
Spoke to patient and informed her that I have resubmitted the pulmonary referral and told her to contact us if there were any problems.  She verbalized understanding.

## 2017-12-01 ENCOUNTER — Encounter: Payer: Self-pay | Admitting: Family

## 2017-12-04 ENCOUNTER — Ambulatory Visit: Payer: 59 | Admitting: Internal Medicine

## 2017-12-04 ENCOUNTER — Encounter: Payer: Self-pay | Admitting: Internal Medicine

## 2017-12-04 VITALS — BP 120/60 | HR 60 | Ht 64.5 in | Wt 323.0 lb

## 2017-12-04 DIAGNOSIS — K5100 Ulcerative (chronic) pancolitis without complications: Secondary | ICD-10-CM | POA: Diagnosis not present

## 2017-12-04 DIAGNOSIS — E538 Deficiency of other specified B group vitamins: Secondary | ICD-10-CM

## 2017-12-04 DIAGNOSIS — Z7901 Long term (current) use of anticoagulants: Secondary | ICD-10-CM | POA: Diagnosis not present

## 2017-12-04 MED ORDER — SUPREP BOWEL PREP KIT 17.5-3.13-1.6 GM/177ML PO SOLN: 1.0000 | ORAL | 0 refills | Status: DC

## 2017-12-04 NOTE — Patient Instructions (Signed)
You have been scheduled for a colonoscopy. Please follow written instructions given to you at your visit today.  Please pick up your prep supplies at the pharmacy within the next 1-3 days. If you use inhalers (even only as needed), please bring them with you on the day of your procedure. Your physician has requested that you go to www.startemmi.com and enter the access code given to you at your visit today. This web site gives a general overview about your procedure. However, you should still follow specific instructions given to you by our office regarding your preparation for the procedure.  Please hold your Eliquis the day prior to procedure and morning of your procedure.  If you are age 59 or older, your body mass index should be between 23-30. Your Body mass index is 54.59 kg/m. If this is out of the aforementioned range listed, please consider follow up with your Primary Care Provider.  If you are age 71 or younger, your body mass index should be between 19-25. Your Body mass index is 54.59 kg/m. If this is out of the aformentioned range listed, please consider follow up with your Primary Care Provider.

## 2017-12-04 NOTE — Progress Notes (Signed)
Subjective:    Patient ID: Cassie [name], female    DOB: [birth year], 57 y.o.   MRN: [id number]  HPI Cassie [name] is a 57 year old female with a long-standing history of ulcerative colitis diagnosed around age 57, B12 deficiency, arthritis, obesity, history of DVT and PE on Eliquis, probable sleep apnea who is here for follow-up.  She is here today with her husband.  She states from a GI perspective she has been doing well.  She does not feel that her colitis is active and has not had recent flare of her colitis.  She will intermittently have loose stools which she relates to diet.  She will use Imodium and reports her symptoms resolved.  No recent bleeding including blood in her stool or melena.  She states one time in the last year she noticed a spot of red blood with wiping.  She occasionally has lower abdominal gassy type pain which is not out of her normal.  She denies heartburn and dysphagia.  Occasionally will have hiccups and feel pressure near the xiphoid.  Is struggling with shortness of breath over the last 18 months.  Reports this is been evaluated by cardiology and pulmonology.  She has an appointment with Dr. Melvyn Novas next month.  She also has another sleep study pending but was told she may have sleep apnea.  Currently she is questioning this diagnosis.  She also deals with diffuse joint and body pain.  She is gained about 45 pounds since being seen here last which she attributes to inactivity.  She reports not taking B12 like she should and averages taking it 1 to 2 days/week.  Her last colonoscopy which was performed for surveillance was on 11/18/2015.  This revealed patchy mild inflammation in the descending colon, splenic flexure, transverse colon, hepatic flexure and ascending colon.  A 5 mm polyp was removed from the transverse colon.  Pathology showed chronically active moderate colitis in the right colon and chronically active minimal colitis in the left colon.  The polyp was a  inflammatory pseudopolyp without dysplasia.  In the past she has been intolerant to sulfasalazine and mesalamine.  She has wanted to avoid escalation of therapy such as immunomodulators or Biologics   Review of Systems As per HPI, otherwise negative  Current Medications, Allergies, Past Medical History, Past Surgical History, Family History and Social History were reviewed in [employer] record.     Objective:   Physical Exam BP 120/60 (BP Location: Left Arm, Patient Position: Sitting, Cuff Size: Normal)   Pulse 60   Ht 5' 4.5" (1.638 m)   Wt (!) 323 lb (146.5 kg)   BMI 54.59 kg/m  Constitutional: Well-developed and well-nourished. No distress. HEENT: Normocephalic and atraumatic.  Conjunctivae are normal.  No scleral icterus. Neck: Neck supple. Trachea midline. Cardiovascular: Normal rate, regular rhythm and intact distal pulses.  Pulmonary/chest: Effort normal and breath sounds normal. No wheezing, rales or rhonchi. Abdominal: Soft, nontender, nondistended. Bowel sounds active throughout.  Extremities: no clubbing, cyanosis, 1+ edema Neurological: Alert and oriented to person place and time. Skin: Skin is warm and dry. Psychiatric: Normal mood and affect. Behavior is normal.  CBC    Component Value Date/Time   WBC 6.8 11/27/2017 0811   WBC 8.1 01/05/2017 2055   RBC 4.33 11/27/2017 0811   HGB 12.9 11/27/2017 0811   HGB 13.2 01/24/2017 0929   HCT 40.9 11/27/2017 0811   HCT 40.9 01/24/2017 0929   PLT 244 11/27/2017 0811  PLT 255 01/24/2017 0929   PLT WILL FOLLOW 11/21/2016 0740   MCV 94.5 11/27/2017 0811   MCV 94 01/24/2017 0929   MCH 29.8 11/27/2017 0811   MCHC 31.5 (L) 11/27/2017 0811   RDW 13.5 11/27/2017 0811   RDW 14.6 01/24/2017 0929   LYMPHSABS 1.9 11/27/2017 0811   LYMPHSABS 1.8 01/24/2017 0929   MONOABS 0.5 11/27/2017 0811   EOSABS 0.2 11/27/2017 0811   EOSABS 0.2 01/24/2017 0929   BASOSABS 0.0 11/27/2017 0811   BASOSABS 0.0  01/24/2017 0929   CMP     Component Value Date/Time   NA 139 11/27/2017 0811   NA 139 01/24/2017 0929   K 4.0 11/27/2017 0811   K 4.0 01/24/2017 0929   CL 103 11/27/2017 0811   CL 103 10/10/2016 1403   CL 105 09/09/2016 1332   CO2 27 11/27/2017 0811   CO2 24 01/24/2017 0929   GLUCOSE 100 (H) 11/27/2017 0811   GLUCOSE 100 01/24/2017 0929   GLUCOSE 92 09/09/2016 1332   BUN 11 11/27/2017 0811   BUN 9.5 01/24/2017 0929   CREATININE 0.81 11/27/2017 0811   CREATININE 0.8 01/24/2017 0929   CALCIUM 9.5 11/27/2017 0811   CALCIUM 9.7 01/24/2017 0929   PROT 7.4 11/27/2017 0811   PROT 7.6 01/24/2017 0929   ALBUMIN 3.3 (L) 11/27/2017 0811   ALBUMIN 3.2 (L) 01/24/2017 0929   AST 19 11/27/2017 0811   AST 18 01/24/2017 0929   ALT 14 11/27/2017 0811   ALT 16 01/24/2017 0929   ALKPHOS 104 11/27/2017 0811   ALKPHOS 107 01/24/2017 0929   BILITOT 0.3 11/27/2017 0811   BILITOT 0.31 01/24/2017 0929   GFRNONAA >60 11/27/2017 0811   GFRAA >60 11/27/2017 0811      Assessment & Plan:  57 year old female with a long-standing history of ulcerative colitis diagnosed around age 30, B12 deficiency, arthritis, obesity, history of DVT and PE on Eliquis, probable sleep apnea who is here for follow-up.   1. Long-standing UC --long-standing colitis.  She is due for surveillance colonoscopy at this time.  We discussed the risk, benefits and alternatives and she is agreeable and wishes to proceed.  This will be done in the outpatient hospital setting due to her BMI.  We will perform this after she is evaluated by Dr. Melvyn Novas.  She remains uninterested in additional colitis therapy but we can discuss based on findings of colonoscopy.  She has been intolerant to 5-ASA medications.  Will hold Eliquis 1 day prior to endoscopic procedures (normal hold x48 hours the patient is very nervous being off this medication thus we will hold 24 hours)- will instruct when and how to resume after procedure. Benefits and risks of  procedure explained including risks of bleeding, perforation, infection, missed lesions, reactions to medications and possible need for hospitalization and surgery for complications. Additional rare but real risk of stroke or other vascular clotting events off Eliquis also explained and need to seek urgent help if any signs of these problems occur. Will communicate by phone or EMR with patient's  prescribing provider to confirm that holding Eliquis is reasonable in this case.   2. B12 deficiency --intermittent B12 supplementation.  I encouraged her to take this daily.  25 minutes spent with the patient today. Greater than 50% was spent in counseling and coordination of care with the patient

## 2017-12-11 ENCOUNTER — Encounter: Payer: Self-pay | Admitting: Hematology & Oncology

## 2017-12-21 ENCOUNTER — Encounter (HOSPITAL_BASED_OUTPATIENT_CLINIC_OR_DEPARTMENT_OTHER): Payer: 59

## 2017-12-22 ENCOUNTER — Telehealth: Payer: Self-pay | Admitting: *Deleted

## 2017-12-22 NOTE — Telephone Encounter (Signed)
I placed call to pt to schedule appointment with Dr. Radford Pax. Left message to call back

## 2017-12-22 NOTE — Telephone Encounter (Signed)
-----   Message from Nelva Bush, MD sent at 12/22/2017 11:40 AM EDT ----- Regarding: Sleep study Hello,  Ms. Rogacki is very anxious about her sleep study, which is scheduled for Monday.  She would like to speak with Dr. Radford Pax first.  Would it be possible to have her see Dr. Radford Pax in the office and postpone the sleep study until after that visit?  Thanks.  [name]

## 2017-12-25 ENCOUNTER — Encounter (HOSPITAL_BASED_OUTPATIENT_CLINIC_OR_DEPARTMENT_OTHER): Payer: 59

## 2017-12-25 NOTE — Telephone Encounter (Signed)
Spoke to patient who is hesitant to have sleep study on 8/19 and has cancelled.  She would like to see Dr Radford Pax for an OV before having the study.  I scheduled her for 11/4, this was according to patient's request.

## 2017-12-26 ENCOUNTER — Telehealth: Payer: Self-pay | Admitting: *Deleted

## 2017-12-26 NOTE — Telephone Encounter (Signed)
Reached out to the patient to inform her of your recommendation and the patient has expressed that she does not want to get a cpap RIGHT NOW but she would like to keep her appointment with you in November. She will discuss her sleep therapy at her appointment. Sent to Dr Radford Pax for advisement.

## 2017-12-26 NOTE — Telephone Encounter (Signed)
-----   Message from Sueanne Margarita, MD sent at 12/25/2017  6:52 PM EDT ----- Regarding: RE: pt wasnts a consultation Please order an Airsense CPAP with heated humidity and mask of choice on auto from 4 to 20cm H2O and get a download in 2 weeks and followup with me in 10 weeks  Traci Turner,MD   ----- Message ----- From: Freada Bergeron, CMA Sent: 12/22/2017  12:25 PM EDT To: Sueanne Margarita, MD Subject: pt wasnts a consultation                       Patient wants a sleep study consultation because she has a lot of physical issues like back pain that may prevent her form making it all the through her study. Patient has had a home sleep study. Please advise

## 2018-01-01 ENCOUNTER — Ambulatory Visit (INDEPENDENT_AMBULATORY_CARE_PROVIDER_SITE_OTHER)
Admission: RE | Admit: 2018-01-01 | Discharge: 2018-01-01 | Disposition: A | Discharge status: Home / Self Care | Payer: 59 | Source: Ambulatory Visit | Attending: Internal Medicine | Admitting: Internal Medicine

## 2018-01-01 ENCOUNTER — Other Ambulatory Visit (INDEPENDENT_AMBULATORY_CARE_PROVIDER_SITE_OTHER): Payer: 59

## 2018-01-01 ENCOUNTER — Ambulatory Visit: Payer: 59 | Admitting: Internal Medicine

## 2018-01-01 ENCOUNTER — Encounter: Payer: Self-pay | Admitting: Internal Medicine

## 2018-01-01 VITALS — BP 130/74 | HR 83 | Ht 65.0 in | Wt 323.0 lb

## 2018-01-01 DIAGNOSIS — R0609 Other forms of dyspnea: Secondary | ICD-10-CM | POA: Diagnosis not present

## 2018-01-01 DIAGNOSIS — J45991 Cough variant asthma: Secondary | ICD-10-CM

## 2018-01-01 DIAGNOSIS — R05 Cough: Secondary | ICD-10-CM | POA: Diagnosis not present

## 2018-01-01 LAB — CBC WITH DIFFERENTIAL/PLATELET
BASOS PCT: 0.4 % (ref 0.0–3.0)
Basophils Absolute: 0 10*3/uL (ref 0.0–0.1)
EOS ABS: 0.2 10*3/uL (ref 0.0–0.7)
Eosinophils Relative: 3.1 % (ref 0.0–5.0)
HCT: 41 % (ref 36.0–46.0)
HEMOGLOBIN: 13.3 g/dL (ref 12.0–15.0)
LYMPHS ABS: 1.9 10*3/uL (ref 0.7–4.0)
Lymphocytes Relative: 24.3 % (ref 12.0–46.0)
MCHC: 32.6 g/dL (ref 30.0–36.0)
MCV: 91.8 fl (ref 78.0–100.0)
MONO ABS: 0.6 10*3/uL (ref 0.1–1.0)
Monocytes Relative: 8 % (ref 3.0–12.0)
NEUTROS PCT: 64.2 % (ref 43.0–77.0)
Neutro Abs: 5 10*3/uL (ref 1.4–7.7)
PLATELETS: 262 10*3/uL (ref 150.0–400.0)
RBC: 4.46 Mil/uL (ref 3.87–5.11)
RDW: 14.4 % (ref 11.5–15.5)
WBC: 7.9 10*3/uL (ref 4.0–10.5)

## 2018-01-01 LAB — NITRIC OXIDE: Nitric Oxide: 10

## 2018-01-01 MED ORDER — FAMOTIDINE 20 MG PO TABS: ORAL_TABLET | ORAL | 11 refills | Status: DC

## 2018-01-01 MED ORDER — RABEPRAZOLE SODIUM 20 MG PO TBEC: 20.0000 mg | DELAYED_RELEASE_TABLET | Freq: Every day | ORAL | 2 refills | Status: DC

## 2018-01-01 NOTE — Patient Instructions (Addendum)
Aciphex 20 mg    Take  30-60 min before first meal of the day and Pepcid (famotidine)  20 mg one @  bedtime until return to office - this is the best way to tell whether stomach acid is contributing to your problem.     GERD (REFLUX)  is an extremely common cause of respiratory symptoms just like yours , many times with no obvious heartburn at all.    It can be treated with medication, but also with lifestyle changes including elevation of the head of your bed (ideally with 6 inch  bed blocks),  Smoking cessation, avoidance of late meals, excessive alcohol, and avoid fatty foods, chocolate, peppermint, colas, red wine, and acidic juices such as orange juice.  NO MINT OR MENTHOL PRODUCTS SO NO COUGH DROPS   USE SUGARLESS CANDY INSTEAD (Jolley ranchers or Stover's or Life Savers) or even ice chips will also do - the key is to swallow to prevent all throat clearing. NO OIL BASED VITAMINS - use powdered substitutes.     Please remember to go to the lab and x-ray department downstairs in the basement  for your tests - we will call you with the results when they are available.   Please schedule a follow up office visit in 6 weeks, call sooner if needed with all medications /inhalers/ solutions in hand so we can verify exactly what you are taking. This includes all medications from all doctors and over the counters

## 2018-01-01 NOTE — Progress Notes (Addendum)
Cassie [name], female    DOB: 05-04-1961,   MRN: [id number]    Brief patient profile:  59 yowf with MO/ prev dx of gerd and [place] in [place] in 2000s reports quit smoking in 1983 at the age of 36 no resp problems but around 2009 noted onset mostly in fall sometimes in spring stuffy nose with new sob fall 2017 abruptly  sob referred to pulmonary clinic 01/01/2018 by Dr   End of cardiology       S/p single RLL PE 07/04/16  "felt funny"  Neg venous dopplers, rx elaquis ever since    History of Present Illness  01/01/2018  Pulmonary consultatoin  Chief Complaint  Patient presents with  . Consult    Referred by Dr. Saunders Revel due to SOB x1 year that has progressively become worse. Pt states SOB can happen at any time, even when just sitting still doing nothing. Pt also has c/o cough and some sensation in the chest.  Dyspnea:  Onset fairly abruptly fall 2017 variable when started but somewhat linked to activity and rarely occurs at rest assoc with some night time wheeze maybe twice a week.  Presently room to room but not every time ,  Also by back pain and doe so can't do shopping.   Cough: always cough in doctor's office or hospital= dry/ hacking (husband says present x months, not just in office)  Sleep: ok on 3 pillows on back  SABA use: never used one    No obvious other patterns in day to day or daytime variability or assoc excess/ purulent sputum or mucus plugs or hemoptysis or cp or chest tightness, subjective wheeze or overt sinus or hb symptoms.   Sleeps as above  without nocturnal  or early am exacerbation  of respiratory  c/o's or need for noct saba. Also denies any obvious fluctuation of symptoms with weather or environmental changes or other aggravating or alleviating factors except as outlined above   No unusual exposure hx or h/o childhood pna/ asthma or knowledge of premature birth.  Current Allergies, Complete Past Medical History, Past Surgical History, Family History, and Social History  were reviewed in [employer] record.    ROS  The following are not active complaints unless bolded Hoarseness, sore throat, dysphagia, dental problems, itching, sneezing,  nasal congestion or discharge of excess mucus or purulent secretions, ear ache,   fever, chills, sweats, unintended wt loss or wt gain, classically pleuritic or exertional cp,  orthopnea pnd or arm/hand swelling  or leg swelling, presyncope, palpitations, abdominal pain, anorexia, nausea, vomiting, diarrhea  or change in bowel habits or change in bladder habits, change in stools or change in urine, dysuria, hematuria,  rash, arthralgias, visual complaints, headache, numbness, weakness or ataxia or problems with walking or coordination,  change in mood or  memory.           Past Medical History:  Diagnosis Date  . Adrenal nodule ([place])   . Allergy   . Anemia    20 yrs. ago not now- pt denies   . Anxiety   . Arthritis    knees,thumbs  . Cardiac arrhythmia    palpitations,PVC'sAC  . Cataract    bilateral removed   . Colon polyp    pseudo  . Cyst of kidney, acquired    cyst right kidney- benign  . Gallstones   . Morbid obesity ([place])   . Osteoporosis    knees  . Other allergy, other than  to medicinal agents   . Panic disorder   . Pulmonary embolism ([place])   . Run of atrial premature complexes   . Ulcerative colitis, unspecified   . Vitamin B12 deficiency   . Vitamin D deficiency     Outpatient Medications Prior to Visit  Medication Sig Dispense Refill  . ALPRAZolam (XANAX) 1 MG tablet TAKE 1/2 TO 1 TABLET BY MOUTH TWICE A DAY AS NEEDED FOR ANXIETY 60 tablet 3  . atenolol (TENORMIN) 50 MG tablet Take 1/2 tablet ( 27m ) by mouth 2 times per day 90 tablet 3  . Cholecalciferol (VITAMIN D3) 2000 units capsule Take 4,000 Units by mouth daily with lunch.    .Marland KitchenELIQUIS 5 MG TABS tablet TAKE 1 TABLET BY MOUTH TWICE DAILY 60 tablet 6  . folic acid (FOLVITE) 8384MCG tablet Take 800 mcg by mouth  daily.    . furosemide (LASIX) 20 MG tablet Take 1 tablet (20 mg) daily as needed for swelling. 30 tablet 2  . loperamide (IMODIUM A-D) 2 MG tablet Take 2 mg by mouth 4 (four) times daily as needed for diarrhea or loose stools.    .Vladimir FasterGlycol-Propyl Glycol (SYSTANE OP) Place 1 drop into both eyes daily as needed (dry eyes).    . Probiotic Product (PROBIOTIC DAILY PO) Take 2 capsules by mouth daily with lunch.     . Simethicone (GAS-X PO) Take 1 tablet by mouth 4 (four) times daily as needed (For gas.). Do not exceed 6 tablets in a 24 hour period.    . vitamin B-12 (CYANOCOBALAMIN) 500 MCG tablet Take 500 mcg by mouth daily with lunch.    .Manus GunningBOWEL PREP KIT 17.5-3.13-1.6 GM/177ML SOLN Take 1 kit by mouth as directed. For colonoscopy prep (Patient not taking: Reported on 01/01/2018) 2 Bottle 0   No facility-administered medications prior to visit.             Objective:     BP 130/74 (BP Location: Left Arm, Cuff Size: Normal)   Pulse 83   Ht 5' 5"  (1.651 m)   Wt (!) 323 lb (146.5 kg)   SpO2 95%   BMI 53.75 kg/m   SpO2: 95 %  RA   amb anxious obese wf nad    Wt Readings from Last 3 Encounters:  01/01/18 (!) 323 lb (146.5 kg)  12/04/17 (!) 323 lb (146.5 kg)  11/27/17 (!) 323 lb (146.5 kg)     Vital signs reviewed - Note on arrival 02 sats  95% on RA   HEENT: nl dentition, turbinates bilaterally, and oropharynx. Nl external ear canals without cough reflex   NECK :  without JVD/Nodes/TM/ nl carotid upstrokes bilaterally   LUNGS: no acc muscle use,  Nl contour chest with prominent pseudowheeze, reslolves with plm / otherwise  clear to A and P bilaterally without cough on insp or exp maneuvers   CV:  RRR  no s3 or murmur or increase in P2, and  Trace pitting both ankles   ABD:  Massively obese with limited inspiratory excursion in the supine position. No bruits or organomegaly appreciated, bowel sounds nl  MS:  Nl gait/ ext warm without deformities, calf  tenderness, cyanosis or clubbing No obvious joint restrictions   SKIN: warm and dry without lesions    NEURO:  alert, approp, nl sensorium with  no motor or cerebellar deficits apparent.        CXR PA and Lateral:   01/01/2018 :    I  personally reviewed images and agree with radiology impression as follows:    mild cm/ no acute findings    Labs ordered 01/01/2018  Allergy profile   Labs  reviewed:      Chemistry      Component Value Date/Time   NA 139 11/27/2017 0811   NA 139 01/24/2017 0929   K 4.0 11/27/2017 0811   K 4.0 01/24/2017 0929   CL 103 11/27/2017 0811   CL 103 10/10/2016 1403   CL 105 09/09/2016 1332   CO2 27 11/27/2017 0811   CO2 24 01/24/2017 0929   BUN 11 11/27/2017 0811   BUN 9.5 01/24/2017 0929   CREATININE 0.81 11/27/2017 0811   CREATININE 0.8 01/24/2017 0929      Component Value Date/Time   CALCIUM 9.5 11/27/2017 0811   CALCIUM 9.7 01/24/2017 0929   ALKPHOS 104 11/27/2017 0811   ALKPHOS 107 01/24/2017 0929   AST 19 11/27/2017 0811   AST 18 01/24/2017 0929   ALT 14 11/27/2017 0811   ALT 16 01/24/2017 0929   BILITOT 0.3 11/27/2017 0811   BILITOT 0.31 01/24/2017 0929        Lab Results  Component Value Date   WBC 7.9 01/01/2018   HGB 13.3 01/01/2018   HCT 41.0 01/01/2018   MCV 91.8 01/01/2018   PLT 262.0 01/01/2018     Lab Results  Component Value Date   DDIMER <0.27 11/27/2017      Lab Results  Component Value Date   TSH 1.479 07/15/2016     Lab Results  Component Value Date   PROBNP 54 01/05/17                      Assessment   Cough variant asthma vs UACS from gerd PFts 08/04/16 s significant airflow obst but variable insp truncation was present FENO 01/01/2018  =  10  - Allergy profile 01/01/2018 >  Eos 0.2 /  IgE     Cough is downplayed by pt "only happens in a doctors office" but is a major chronic issue per husband who has to listen do it with ddx   = The most common causes of chronic cough in  immunocompetent adults include the following: upper airway cough syndrome (UACS), previously referred to as postnasal drip syndrome (PNDS), which is caused by variety of rhinosinus conditions; (2) asthma; (3) GERD; (4) chronic bronchitis from cigarette smoking or other inhaled environmental irritants; (5) nonasthmatic eosinophilic bronchitis; and (6) bronchiectasis.   These conditions, singly or in combination, have accounted for up to 94% of the causes of chronic cough in prospective studies.   Other conditions have constituted no >6% of the causes in prospective studies These have included bronchogenic carcinoma, chronic interstitial pneumonia, sarcoidosis, left ventricular failure, ACEI-induced cough, and aspiration from a condition associated with pharyngeal dysfunction.    Chronic cough is often simultaneously caused by more than one condition. A single cause has been found from 38 to 82% of the time, multiple causes from 18 to 62%. Multiply caused cough has been the result of three diseases up to 42% of the time.   Most likely this is Upper airway cough syndrome (previously labeled PNDS),  is so named because it's frequently impossible to sort out how much is  CR/sinusitis with freq throat clearing (which can be related to primary GERD)   vs  causing  secondary (" extra esophageal")  GERD from wide swings in gastric pressure that occur with throat clearing,  often  promoting self use of mint and menthol lozenges that reduce the lower esophageal sphincter tone and exacerbate the problem further in a cyclical fashion.   These are the same pts (now being labeled as having "irritable larynx syndrome" by some cough centers) who not infrequently have a history of having failed to tolerate ace inhibitors,  dry powder inhalers or biphosphonates or report having atypical/extraesophageal reflux symptoms that don't respond to standard doses of PPI  and are easily confused as having aecopd or asthma flares by  even experienced allergists/ pulmonologists (myself included).   Of the three most common causes of  Sub-acute / recurrent or chronic cough, only one (GERD)  can actually contribute to/ trigger  the other two (asthma and post nasal drip syndrome)  and perpetuate the cylce of cough.  While not intuitively obvious, many patients with chronic low grade reflux do not cough until there is a primary insult that disturbs the protective epithelial barrier and exposes sensitive nerve endings.   This is typically viral but can due to PNDS and  either may apply here.   The point is that once this occurs, it is difficult to eliminate the cycle  using anything but a maximally effective acid suppression regimen at least in the short run, accompanied by an appropriate diet to address non acid GERD.   I strongly rec a trial of aciphex q am ac and pepcid q pm but she wants to check with Dr Hilarie Fredrickson due to [facility] before taking it.        DOE (dyspnea on exertion) S/p single RLL PE 07/04/16 s assoc change in sob  - PFT's   08/03/16  FEV1 2.28 (83 % ) ratio 75  p 10 % improvement from saba p nothing prior to study with DLCO  87/87c % corrects to 99 % for alv volume and ERV 17% c/w obesity effects  - 01/01/2018   Walked RA  2 laps @ 185 ft each stopped due to  Cough/fatigue > sob with nl sats at moderate pace    Symptoms are markedly disproportionate to objective findings and not clear to what extent this is actually a pulmonary  problem but pt does appear to have difficult to sort out respiratory symptoms of unknown origin for which  DDX  = almost all start with A and  include Adherence, Ace Inhibitors, Acid Reflux, Active Sinus Disease, Alpha 1 Antitripsin deficiency, Anxiety masquerading as Airways dz,  ABPA,  Allergy(esp in young), Aspiration (esp in elderly), Adverse effects of meds,  Active smokers, A bunch of PE's/clot burden (a few small clots can't cause this syndrome unless there is already severe underlying pulm or  vascular dz with poor reserve),  Anemia or thyroid disorder, plus two Bs  = Bronchiectasis and Beta blocker use..and one C= CHF     Adherence is always the initial "prime suspect" and is a multilayered concern that requires a "trust but verify" approach in every patient - starting with knowing how to use medications, especially inhalers, correctly, keeping up with refills and understanding the fundamental difference between maintenance and prns vs those medications only taken for a very short course and then stopped and not refilled.  - req return with all meds in hand using a trust but verify approach to confirm accurate Medication  Reconciliation The principal here is that until we are certain that the  patients are doing what we've asked, it makes no sense to ask them to do more.   ?  Acid (or non-acid) GERD > always difficult to exclude as up to 75% of pts in some series report no assoc GI/ Heartburn symptoms> rec max (24h)  acid suppression and diet restrictions/ reviewed and instructions given in writing. > declines advice at this time > consider DgEs or defer completely to Dr Hilarie Fredrickson  ? Allergy/asthma  > low feno and prior pfts rule against > check allergy  profile, consider MCT if dx in doubt  ? Adverse drug effects > none of the usual suspects listed  ? Anemia/ thyroid dz > ruled out   ? Anxiety > usually at the bottom of this list of usual suspects but should be much higher on this pt's based on H and P and note already on psychotropics and may interfere with adherence and also interpretation of response or lack thereof to symptom management which can be quite subjective.    ? A bunch of PE's > note that she only had one, was not sob at presentation,  And on eliquis since sos unlikely  ? BB effects > unlikely on low doses of tenormin in absence of airflow obst on pfts   ? CHF > exluded by cards          Morbid obesity ([place]) Body mass index is 53.75 kg/m.     Lab Results    Component Value Date   TSH 1.479 07/15/2016     Contributing to gerd risk/ doe/reviewed the need and the process to achieve and maintain neg calorie balance > defer f/u primary care including intermittently monitoring thyroid status      F/u in 6 weeks with all meds/ try to either do DgEs or trial of gerd rx in meantime   Total time devoted to counseling  > 50 % of initial 60 min office visit:  review case with pt/husband discussion of options/alternatives/ personally creating written customized instructions  in presence of pt  then going over those specific  Instructions directly with the pt including how to use all of the meds but in particular covering each new medication in detail and the difference between the maintenance= "automatic" meds and the prns using an action plan format for the latter (If this problem/symptom => do that organization reading Left to right).  Please see AVS from this visit for a full list of these instructions which I personally wrote for this pt and  are unique to this visit.           Christinia Gully, MD 01/01/2018

## 2018-01-02 ENCOUNTER — Encounter: Payer: Self-pay | Admitting: Hematology & Oncology

## 2018-01-02 ENCOUNTER — Encounter: Payer: Self-pay | Admitting: Internal Medicine

## 2018-01-02 LAB — RESPIRATORY ALLERGY PROFILE REGION II ~~LOC~~
Allergen, A. alternata, m6: <0.1 kU/L
Allergen, Comm Silver Birch, t9: <0.1 kU/L
Allergen, D pternoyssinus,d7: <0.1 kU/L
Allergen, Mouse Urine Protein, e78: <0.1 kU/L
Allergen, Oak,t7: <0.1 kU/L
Box Elder IgE: <0.1 kU/L
CLADOSPORIUM HERBARUM (M2) IGE: <0.1 kU/L
CLASS: 0
CLASS: 0
CLASS: 0
CLASS: 0
CLASS: 0
CLASS: 0
CLASS: 0
CLASS: 0
CLASS: 0
CLASS: 0
COMMON RAGWEED (SHORT) (W1) IGE: <0.1 kU/L
Class: 0
Class: 0
Class: 0
Class: 0
Class: 0
Class: 0
Class: 0
Class: 0
Class: 0
Class: 0
Class: 0
Class: 0
Class: 0
Class: 0
D. farinae: <0.1 kU/L
Elm IgE: <0.1 kU/L
IgE (Immunoglobulin E), Serum: 34 kU/L (ref <=114)
Johnson Grass: <0.1 kU/L
Pecan/Hickory Tree IgE: <0.1 kU/L
Timothy Grass: <0.1 kU/L

## 2018-01-02 LAB — INTERPRETATION:

## 2018-01-02 NOTE — Telephone Encounter (Signed)
MW please advise on patients e-mail below. Thank you.

## 2018-01-02 NOTE — Assessment & Plan Note (Addendum)
S/p single RLL PE 07/04/16 s assoc change in sob  - PFT's   08/03/16  FEV1 2.28 (83 % ) ratio 75  p 10 % improvement from saba p nothing prior to study with DLCO  87/87c % corrects to 99 % for alv volume and ERV 17% c/w obesity effects  - 01/01/2018   Walked RA  2 laps @ 185 ft each stopped due to  Cough/fatigue > sob with nl sats at moderate pace    Symptoms are markedly disproportionate to objective findings and not clear to what extent this is actually a pulmonary  problem but pt does appear to have difficult to sort out respiratory symptoms of unknown origin for which  DDX  = almost all start with A and  include Adherence, Ace Inhibitors, Acid Reflux, Active Sinus Disease, Alpha 1 Antitripsin deficiency, Anxiety masquerading as Airways dz,  ABPA,  Allergy(esp in young), Aspiration (esp in elderly), Adverse effects of meds,  Active smokers, A bunch of PE's/clot burden (a few small clots can't cause this syndrome unless there is already severe underlying pulm or vascular dz with poor reserve),  Anemia or thyroid disorder, plus two Bs  = Bronchiectasis and Beta blocker use..and one C= CHF     Adherence is always the initial "prime suspect" and is a multilayered concern that requires a "trust but verify" approach in every patient - starting with knowing how to use medications, especially inhalers, correctly, keeping up with refills and understanding the fundamental difference between maintenance and prns vs those medications only taken for a very short course and then stopped and not refilled.  - req return with all meds in hand using a trust but verify approach to confirm accurate Medication  Reconciliation The principal here is that until we are certain that the  patients are doing what we've asked, it makes no sense to ask them to do more.   ? Acid (or non-acid) GERD > always difficult to exclude as up to 75% of pts in some series report no assoc GI/ Heartburn symptoms> rec max (24h)  acid suppression and  diet restrictions/ reviewed and instructions given in writing. > declines advice at this time > consider DgEs or defer completely to Dr Hilarie Fredrickson  ? Allergy/asthma  > low feno and prior pfts rule against > check allergy  profile, consider MCT if dx in doubt  ? Adverse drug effects > none of the usual suspects listed  ? Anemia/ thyroid dz > ruled out   ? Anxiety > usually at the bottom of this list of usual suspects but should be much higher on this pt's based on H and P and note already on psychotropics and may interfere with adherence and also interpretation of response or lack thereof to symptom management which can be quite subjective.    ? A bunch of PE's > note that she only had one, was not sob at presentation,  And on eliquis since sos unlikely  ? BB effects > unlikely on low doses of tenormin in absence of airflow obst on pfts   ? CHF > exluded by cards

## 2018-01-02 NOTE — Progress Notes (Signed)
Spoke with pt and notified of results per Dr. Wert. Pt verbalized understanding and denied any questions. 

## 2018-01-02 NOTE — Assessment & Plan Note (Signed)
Body mass index is 53.75 kg/m.    Lab Results  Component Value Date   TSH 1.479 07/15/2016     Contributing to gerd risk/ doe/reviewed the need and the process to achieve and maintain neg calorie balance > defer f/u primary care including intermittently monitoring thyroid status      F/u in 6 weeks with all meds/ try to either do DgEs or trial of gerd rx in meantime   Total time devoted to counseling  > 50 % of initial 60 min office visit:  review case with pt/husband discussion of options/alternatives/ personally creating written customized instructions  in presence of pt  then going over those specific  Instructions directly with the pt including how to use all of the meds but in particular covering each new medication in detail and the difference between the maintenance= "automatic" meds and the prns using an action plan format for the latter (If this problem/symptom => do that organization reading Left to right).  Please see AVS from this visit for a full list of these instructions which I personally wrote for this pt and  are unique to this visit.

## 2018-01-02 NOTE — Assessment & Plan Note (Signed)
PFts 08/04/16 s significant airflow obst but variable insp truncation was present FENO 01/01/2018  =  10  - Allergy profile 01/01/2018 >  Eos 0.2 /  IgE     Cough is downplayed by pt "only happens in a doctors office" but is a major chronic issue per husband who has to listen do it with ddx   = The most common causes of chronic cough in immunocompetent adults include the following: upper airway cough syndrome (UACS), previously referred to as postnasal drip syndrome (PNDS), which is caused by variety of rhinosinus conditions; (2) asthma; (3) GERD; (4) chronic bronchitis from cigarette smoking or other inhaled environmental irritants; (5) nonasthmatic eosinophilic bronchitis; and (6) bronchiectasis.   These conditions, singly or in combination, have accounted for up to 94% of the causes of chronic cough in prospective studies.   Other conditions have constituted no >6% of the causes in prospective studies These have included bronchogenic carcinoma, chronic interstitial pneumonia, sarcoidosis, left ventricular failure, ACEI-induced cough, and aspiration from a condition associated with pharyngeal dysfunction.    Chronic cough is often simultaneously caused by more than one condition. A single cause has been found from 38 to 82% of the time, multiple causes from 18 to 62%. Multiply caused cough has been the result of three diseases up to 42% of the time.   Most likely this is Upper airway cough syndrome (previously labeled PNDS),  is so named because it's frequently impossible to sort out how much is  CR/sinusitis with freq throat clearing (which can be related to primary GERD)   vs  causing  secondary (" extra esophageal")  GERD from wide swings in gastric pressure that occur with throat clearing, often  promoting self use of mint and menthol lozenges that reduce the lower esophageal sphincter tone and exacerbate the problem further in a cyclical fashion.   These are the same pts (now being labeled as  having "irritable larynx syndrome" by some cough centers) who not infrequently have a history of having failed to tolerate ace inhibitors,  dry powder inhalers or biphosphonates or report having atypical/extraesophageal reflux symptoms that don't respond to standard doses of PPI  and are easily confused as having aecopd or asthma flares by even experienced allergists/ pulmonologists (myself included).   Of the three most common causes of  Sub-acute / recurrent or chronic cough, only one (GERD)  can actually contribute to/ trigger  the other two (asthma and post nasal drip syndrome)  and perpetuate the cylce of cough.  While not intuitively obvious, many patients with chronic low grade reflux do not cough until there is a primary insult that disturbs the protective epithelial barrier and exposes sensitive nerve endings.   This is typically viral but can due to PNDS and  either may apply here.   The point is that once this occurs, it is difficult to eliminate the cycle  using anything but a maximally effective acid suppression regimen at least in the short run, accompanied by an appropriate diet to address non acid GERD.   I strongly rec a trial of aciphex q am ac and pepcid q pm but she wants to check with Dr Hilarie Fredrickson due to [facility] before taking it.

## 2018-01-03 NOTE — Progress Notes (Signed)
Spoke with pt and notified of results per Dr. Wert. Pt verbalized understanding and denied any questions. 

## 2018-01-06 ENCOUNTER — Other Ambulatory Visit: Payer: Self-pay | Admitting: Family

## 2018-01-10 ENCOUNTER — Other Ambulatory Visit: Payer: Self-pay | Admitting: *Deleted

## 2018-01-10 MED ORDER — APIXABAN 5 MG PO TABS: 5.0000 mg | ORAL_TABLET | Freq: Two times a day (BID) | ORAL | 0 refills | Status: DC

## 2018-01-10 NOTE — Telephone Encounter (Signed)
Please let patient know I got her most recent email She does want to schedule the previously mentioned and discussed barium esophagram with upper GI series This can be scheduled for her She has been seen by pulmonology and the colonoscopy is scheduled at [facility] long in the outpatient hospital setting with anesthesia involvement.  This is the most controlled way to perform the sedation and colonoscopy and thus I feel that it is safe for her to proceed.  Certainly anesthesia will weigh in as well and we would not proceed unless they were 100% comfortable. That said if she is nervous about the exam then we can delay it until she is comfortable and wishes to proceed.  Only she needs to be the one comfortable with colonoscopy or any other sedated examination before proceeding

## 2018-01-11 ENCOUNTER — Other Ambulatory Visit: Payer: Self-pay | Admitting: Internal Medicine

## 2018-01-11 DIAGNOSIS — K219 Gastro-esophageal reflux disease without esophagitis: Secondary | ICD-10-CM

## 2018-01-15 ENCOUNTER — Telehealth: Payer: Self-pay | Admitting: Internal Medicine

## 2018-01-15 NOTE — Telephone Encounter (Signed)
Dottie I think you have been working with this pt.

## 2018-01-16 NOTE — Telephone Encounter (Signed)
I have spoken to patient to advise that unfortunately, we do not have an availability for hospital colonoscopy the week 02/26/18. We are quite limited as to which hospital dates and times we can do procedures as these are set by the hospital. Patient indicated that she honestly wanted to just have the upper GI procedures completed at this time anyway and gave very elaborate answer as to her reasoning behind this, including in large part, her shortness of breath. She states that after the upper GI portion has been evaluated she will try to have colonosopy. However, she also states that "Dr Hilarie Fredrickson is the Doctor and I dont want to go against him, so if he thinks I need to have the colonoscopy, I will do that too." She will hold off on colonoscopy for now.

## 2018-02-06 ENCOUNTER — Ambulatory Visit (HOSPITAL_COMMUNITY): Payer: 59

## 2018-02-06 ENCOUNTER — Encounter (HOSPITAL_COMMUNITY): Payer: Self-pay

## 2018-02-06 ENCOUNTER — Ambulatory Visit (HOSPITAL_COMMUNITY): Admit: 2018-02-06 | Payer: 59 | Admitting: Internal Medicine

## 2018-02-06 SURGERY — COLONOSCOPY WITH PROPOFOL
Anesthesia: Monitor Anesthesia Care

## 2018-02-10 ENCOUNTER — Other Ambulatory Visit: Payer: Self-pay | Admitting: Family

## 2018-02-12 ENCOUNTER — Ambulatory Visit: Payer: 59 | Admitting: Internal Medicine

## 2018-03-01 ENCOUNTER — Ambulatory Visit (HOSPITAL_COMMUNITY): Payer: 59

## 2018-03-06 ENCOUNTER — Other Ambulatory Visit: Payer: Self-pay | Admitting: Internal Medicine

## 2018-03-06 NOTE — Telephone Encounter (Signed)
Control database checked last refill: 02/04/2018 LOV: 05/16/2017 NOV: none

## 2018-03-12 ENCOUNTER — Encounter: Payer: Self-pay | Admitting: Cardiology

## 2018-03-12 ENCOUNTER — Ambulatory Visit: Payer: 59 | Admitting: Cardiology

## 2018-03-12 VITALS — BP 120/76 | HR 58 | Ht 65.0 in | Wt 317.0 lb

## 2018-03-12 DIAGNOSIS — Z9989 Dependence on other enabling machines and devices: Secondary | ICD-10-CM

## 2018-03-12 DIAGNOSIS — G4734 Idiopathic sleep related nonobstructive alveolar hypoventilation: Secondary | ICD-10-CM

## 2018-03-12 DIAGNOSIS — G4733 Obstructive sleep apnea (adult) (pediatric): Secondary | ICD-10-CM | POA: Diagnosis not present

## 2018-03-12 DIAGNOSIS — I493 Ventricular premature depolarization: Secondary | ICD-10-CM | POA: Diagnosis not present

## 2018-03-12 NOTE — Patient Instructions (Signed)
Medication Instructions:  Your physician recommends that you continue on your current medications as directed. Please refer to the Current Medication list given to you today.  If you need a refill on your cardiac medications before your next appointment, please call your pharmacy.   Lab work:  If you have labs (blood work) drawn today and your tests are completely normal, you will receive your results only by: Marland Kitchen MyChart Message (if you have MyChart) OR . A paper copy in the mail If you have any lab test that is abnormal or we need to change your treatment, we will call you to review the results.  Testing/Procedures: Overnight Pulse Oximetry, our sleep coordinator will follow up with you.  Follow-Up: As needed

## 2018-03-12 NOTE — Progress Notes (Signed)
Cardiology Office Note:    Date:  03/12/2018   ID:  Cassie [name], DOB Apr 16, 1961, MRN [id number]  PCP:  Hoyt Koch, MD  Cardiologist:  Nelva Bush, MD    Referring MD: Hoyt Koch, *   Chief Complaint  Patient presents with  . Sleep Apnea    History of Present Illness:    Cassie [name] is a 57 y.o. female with a hx of PVCs, morbid obesity and PE followed by Dr. Saunders Revel who underwent home sleep study.  She was found to have mild OSA with an AHI of 6.9/ with nocturnal hypoxemia with O2 sats < 89% for 14 minutes.  CPAP titration was recommended but patient declined until she could discuss results.  She says that she has no morning headaches, nonrestorative sleep, excessive daytime sleepiness or apneas that she aware of, although her husband sleeps with a CPAP and never wakes up at night.  She has been told that she has some mild snoring but no witnessed apneas.  She is concerned that her home sleep study did not have the right times that she placed the device on or ended the study and feels the study may be inaccurate and does not want to go on CPAP unless absolutely necessary.   Past Medical History:  Diagnosis Date  . Adrenal nodule ([place])   . Allergy   . Anemia    20 yrs. ago not now- pt denies   . Anxiety   . Arthritis    knees,thumbs  . Cardiac arrhythmia    palpitations,PVC'sAC  . Cataract    bilateral removed   . Colon polyp    pseudo  . Cough variant asthma 01/01/2018  . Cyst of kidney, acquired    cyst right kidney- benign  . Gallstones   . Morbid obesity ([place])   . OSA (obstructive sleep apnea) 03/12/2018  . OSA on CPAP 03/12/2018   Mild OSA with AHI 6.9/hr with nocturnal hypoxemia now on CPAP  . Osteoporosis    knees  . Other allergy, other than to medicinal agents   . Panic disorder   . Pulmonary embolism ([place])   . Run of atrial premature complexes   . Ulcerative colitis, unspecified   . Vitamin B12 deficiency   . Vitamin D deficiency       Past Surgical History:  Procedure Laterality Date  . BREAST BIOPSY  11/2011   Left- benign  . CATARACT EXTRACTION Right   . CATARACT EXTRACTION Left   . CHOLECYSTECTOMY N/A 01/17/2014   Procedure: LAPAROSCOPIC CHOLECYSTECTOMY;  Surgeon: Excell Seltzer, MD;  Location: [facility];  Service: General;  Laterality: N/A;  . COLONOSCOPY    . RETINAL LASER PROCEDURE Left   . SIGMOIDOSCOPY      Current Medications: Current Meds  Medication Sig  . ALPRAZolam (XANAX) 1 MG tablet Take 0.5-1 tablets (0.5-1 mg total) by mouth 2 (two) times daily as needed for anxiety. Needs visit for any refills.  Marland Kitchen apixaban (ELIQUIS) 5 MG TABS tablet Take 1 tablet (5 mg total) by mouth 2 (two) times daily.  Marland Kitchen atenolol (TENORMIN) 50 MG tablet Take 1/2 tablet ( 55m ) by mouth 2 times per day  . Cholecalciferol (VITAMIN D3) 2000 units capsule Take 4,000 Units by mouth daily with lunch.  . folic acid (FOLVITE) 8371MCG tablet Take 800 mcg by mouth daily.  . furosemide (LASIX) 20 MG tablet Take 1 tablet (20 mg) daily as needed for swelling.  . loperamide (IMODIUM A-D)  2 MG tablet Take 2 mg by mouth 4 (four) times daily as needed for diarrhea or loose stools.  Vladimir Faster Glycol-Propyl Glycol (SYSTANE OP) Place 1 drop into both eyes daily as needed (dry eyes).  . Probiotic Product (PROBIOTIC DAILY PO) Take 2 capsules by mouth daily with lunch.   . Simethicone (GAS-X PO) Take 1 tablet by mouth 4 (four) times daily as needed (For gas.). Do not exceed 6 tablets in a 24 hour period.  . vitamin B-12 (CYANOCOBALAMIN) 500 MCG tablet Take 500 mcg by mouth daily with lunch.     Allergies:   Doxycycline; Hydrocodone; Sudafed [pseudoephedrine hcl]; Acular [ketorolac tromethamine]; Diphenhydramine hcl; Lactose intolerance (gi); Mesalamine; Sulfasalazine; Azithromycin; Caffeine; Penicillins; and Prednisone   Social History   Socioeconomic History  . Marital status: Married    Spouse name: Not on file  . Number of children: 0   . Years of education: Not on file  . Highest education level: Not on file  Occupational History  . Occupation: Arboriculturist: UNEMPLOYED  Social Needs  . Financial resource strain: Not on file  . Food insecurity:    Worry: Not on file    Inability: Not on file  . Transportation needs:    Medical: Not on file    Non-medical: Not on file  Tobacco Use  . Smoking status: Former Smoker    Packs/day: 2.00    Years: 12.00    Pack years: 24.00    Types: Cigarettes    Last attempt to quit: 03/07/1979    Years since quitting: 39.0  . Smokeless tobacco: Never Used  Substance and Sexual Activity  . Alcohol use: No    Alcohol/week: 0.0 standard drinks  . Drug use: No  . Sexual activity: Yes    Partners: Male  Lifestyle  . Physical activity:    Days per week: Not on file    Minutes per session: Not on file  . Stress: Not on file  Relationships  . Social connections:    Talks on phone: Not on file    Gets together: Not on file    Attends religious service: Not on file    Active member of club or organization: Not on file    Attends meetings of clubs or organizations: Not on file    Relationship status: Not on file  Other Topics Concern  . Not on file  Social History Narrative   Married: 0 kids   Regular exercise: walks 2 times a week   Caffeine use: none     Family History: The patient's family history includes Atrial fibrillation in her father; Heart disease (age of onset: 50) in her father; Hypertension in her maternal aunt and maternal grandfather. There is no history of Colon cancer, Stomach cancer, Colon polyps, Esophageal cancer, or Rectal cancer.  ROS:   Please see the history of present illness.    ROS  All other systems reviewed and negative.   EKGs/Labs/Other Studies Reviewed:    The following studies were reviewed today: PAP download  EKG:  EKG is not ordered today.   Recent Labs: 11/27/2017: ALT 14; BUN 11; Creatinine 0.81; Potassium 4.0; Sodium  139 01/01/2018: Hemoglobin 13.3; Platelets 262.0   Recent Lipid Panel    Component Value Date/Time   CHOL 175 12/25/2015 0841   TRIG 103.0 12/25/2015 0841   HDL 40.30 12/25/2015 0841   CHOLHDL 4 12/25/2015 0841   VLDL 20.6 12/25/2015 0841   LDLCALC 114 (H) 12/25/2015 5597  Physical Exam:    VS:  BP 120/76   Pulse (!) 58   Ht 5' 5"  (1.651 m)   Wt (!) 317 lb (143.8 kg)   SpO2 98%   BMI 52.75 kg/m     Wt Readings from Last 3 Encounters:  03/12/18 (!) 317 lb (143.8 kg)  01/01/18 (!) 323 lb (146.5 kg)  12/04/17 (!) 323 lb (146.5 kg)     GEN:  Well nourished, well developed in no acute distress HEENT: Normal NECK: No JVD; No carotid bruits LYMPHATICS: No lymphadenopathy CARDIAC: RRR, no murmurs, rubs, gallops RESPIRATORY:  Clear to auscultation without rales, wheezing or rhonchi  ABDOMEN: Soft, non-tender, non-distended MUSCULOSKELETAL:  No edema; No deformity  SKIN: Warm and dry NEUROLOGIC:  Alert and oriented x 3 PSYCHIATRIC:  Normal affect   ASSESSMENT:    1. PVC (premature ventricular contraction)   2. OSA (obstructive sleep apnea)   3. Morbid obesity ([place])   4. Nocturnal hypoxemia    PLAN:    In order of problems listed above:  1.  OSA - this is mild with an AHI of 6.9/hr but she has evidence of nocturnal hypoxemia with O2 sats < 89% for over 14 minutes. She has no sx of OSA (PV:[id number] daytime sleepiness, am headaches, nonrestorative sleep).  I have recommended that given her ? Nocturnal hypoxemia on home study, I would get an overnight pulse ox to determine if she has any hypoxemic episodes with O2 sats <88% for 5 consecutive minutes or more.  If she has documented hypoxemia then need to establish in detail if these occur only with apnea or occur without sleep disordered breathing which would be consistent with obesity hypoventilation syndrome and treatment would be with overnight O2 and not CPAP. This would require in lab sleep study.  Await results of  overnight pulse ox study.   2.  PVCs - these appear controlled on Atenolol.   3.  Morbid obesity - I have encouraged her to get into a routine exercise program and cut back on carbs and portions but unfortunately she has a lot of arthritic problems and is in a wheelchair today so exercise is very limited..    Medication Adjustments/Labs and Tests Ordered: Current medicines are reviewed at length with the patient today.  Concerns regarding medicines are outlined above.  Orders Placed This Encounter  Procedures  . Pulse oximetry, overnight   No orders of the defined types were placed in this encounter.   Signed, Fransico Him, MD  03/12/2018 6:50 PM    [place]

## 2018-03-17 ENCOUNTER — Encounter: Payer: Self-pay | Admitting: Family

## 2018-03-20 ENCOUNTER — Ambulatory Visit (HOSPITAL_COMMUNITY): Payer: 59

## 2018-04-06 ENCOUNTER — Other Ambulatory Visit: Payer: Self-pay | Admitting: Family

## 2018-04-10 ENCOUNTER — Other Ambulatory Visit: Payer: Self-pay | Admitting: Internal Medicine

## 2018-04-10 NOTE — Telephone Encounter (Signed)
Control database checked last refill: 02/04/2018 LOV: 05/16/2017 anxiety NOV: none

## 2018-04-22 ENCOUNTER — Encounter: Payer: Self-pay | Admitting: Internal Medicine

## 2018-05-04 ENCOUNTER — Encounter: Payer: Self-pay | Admitting: Internal Medicine

## 2018-05-04 ENCOUNTER — Ambulatory Visit (INDEPENDENT_AMBULATORY_CARE_PROVIDER_SITE_OTHER): Payer: 59 | Admitting: Internal Medicine

## 2018-05-04 VITALS — BP 124/80 | HR 62 | Temp 98.1°F | Ht 65.0 in | Wt 317.0 lb

## 2018-05-04 DIAGNOSIS — I872 Venous insufficiency (chronic) (peripheral): Secondary | ICD-10-CM

## 2018-05-04 DIAGNOSIS — Z0000 Encounter for general adult medical examination without abnormal findings: Secondary | ICD-10-CM | POA: Diagnosis not present

## 2018-05-04 DIAGNOSIS — K51919 Ulcerative colitis, unspecified with unspecified complications: Secondary | ICD-10-CM | POA: Diagnosis not present

## 2018-05-04 DIAGNOSIS — F418 Other specified anxiety disorders: Secondary | ICD-10-CM

## 2018-05-04 DIAGNOSIS — Z86711 Personal history of pulmonary embolism: Secondary | ICD-10-CM | POA: Diagnosis not present

## 2018-05-04 DIAGNOSIS — R0609 Other forms of dyspnea: Secondary | ICD-10-CM

## 2018-05-04 MED ORDER — ALPRAZOLAM 1 MG PO TABS: 0.5000 mg | ORAL_TABLET | Freq: Two times a day (BID) | ORAL | 4 refills | Status: DC | PRN

## 2018-05-04 NOTE — Assessment & Plan Note (Signed)
Has compression stockings but does not feel that they are effective.

## 2018-05-04 NOTE — Assessment & Plan Note (Signed)
Overdue for colonoscopy and she is planning to get done in the spring.

## 2018-05-04 NOTE — Assessment & Plan Note (Signed)
Still taking eliquis and no new symptoms. No blood in stool.

## 2018-05-04 NOTE — Patient Instructions (Signed)
Try doing the home sleep oxygen level to see if there are any problems there.   We will try to do the labs with Dr. Marin Olp.   Health Maintenance, Female Adopting a healthy lifestyle and getting preventive care can go a long way to promote health and wellness. Talk with your health care provider about what schedule of regular examinations is right for you. This is a good chance for you to check in with your provider about disease prevention and staying healthy. In between checkups, there are plenty of things you can do on your own. Experts have done a lot of research about which lifestyle changes and preventive measures are most likely to keep you healthy. Ask your health care provider for more information. Weight and diet Eat a healthy diet  Be sure to include plenty of vegetables, fruits, low-fat dairy products, and lean protein.  Do not eat a lot of foods high in solid fats, added sugars, or salt.  Get regular exercise. This is one of the most important things you can do for your health. ? Most adults should exercise for at least 150 minutes each week. The exercise should increase your heart rate and make you sweat (moderate-intensity exercise). ? Most adults should also do strengthening exercises at least twice a week. This is in addition to the moderate-intensity exercise. Maintain a healthy weight  Body mass index (BMI) is a measurement that can be used to identify possible weight problems. It estimates body fat based on height and weight. Your health care provider can help determine your BMI and help you achieve or maintain a healthy weight.  For females 56 years of age and older: ? A BMI below 18.5 is considered underweight. ? A BMI of 18.5 to 24.9 is normal. ? A BMI of 25 to 29.9 is considered overweight. ? A BMI of 30 and above is considered obese. Watch levels of cholesterol and blood lipids  You should start having your blood tested for lipids and cholesterol at 59 years of  age, then have this test every 5 years.  You may need to have your cholesterol levels checked more often if: ? Your lipid or cholesterol levels are high. ? You are older than 59 years of age. ? You are at high risk for heart disease. Cancer screening Lung Cancer  Lung cancer screening is recommended for adults 93-48 years old who are at high risk for lung cancer because of a history of smoking.  A yearly low-dose CT scan of the lungs is recommended for people who: ? Currently smoke. ? Have quit within the past 15 years. ? Have at least a 30-pack-year history of smoking. A pack year is smoking an average of one pack of cigarettes a day for 1 year.  Yearly screening should continue until it has been 15 years since you quit.  Yearly screening should stop if you develop a health problem that would prevent you from having lung cancer treatment. Breast Cancer  Practice breast self-awareness. This means understanding how your breasts normally appear and feel.  It also means doing regular breast self-exams. Let your health care provider know about any changes, no matter how small.  If you are in your 20s or 30s, you should have a clinical breast exam (CBE) by a health care provider every 1-3 years as part of a regular health exam.  If you are 40 or older, have a CBE every year. Also consider having a breast X-ray (mammogram) every year.  If you have a family history of breast cancer, talk to your health care provider about genetic screening.  If you are at high risk for breast cancer, talk to your health care provider about having an MRI and a mammogram every year.  Breast cancer gene (BRCA) assessment is recommended for women who have family members with BRCA-related cancers. BRCA-related cancers include: ? Breast. ? Ovarian. ? Tubal. ? Peritoneal cancers.  Results of the assessment will determine the need for genetic counseling and BRCA1 and BRCA2 testing. Cervical Cancer Your  health care provider may recommend that you be screened regularly for cancer of the pelvic organs (ovaries, uterus, and vagina). This screening involves a pelvic examination, including checking for microscopic changes to the surface of your cervix (Pap test). You may be encouraged to have this screening done every 3 years, beginning at age 55.  For women ages 10-65, health care providers may recommend pelvic exams and Pap testing every 3 years, or they may recommend the Pap and pelvic exam, combined with testing for human papilloma virus (HPV), every 5 years. Some types of HPV increase your risk of cervical cancer. Testing for HPV may also be done on women of any age with unclear Pap test results.  Other health care providers may not recommend any screening for nonpregnant women who are considered low risk for pelvic cancer and who do not have symptoms. Ask your health care provider if a screening pelvic exam is right for you.  If you have had past treatment for cervical cancer or a condition that could lead to cancer, you need Pap tests and screening for cancer for at least 20 years after your treatment. If Pap tests have been discontinued, your risk factors (such as having a new sexual partner) need to be reassessed to determine if screening should resume. Some women have medical problems that increase the chance of getting cervical cancer. In these cases, your health care provider may recommend more frequent screening and Pap tests. Colorectal Cancer  This type of cancer can be detected and often prevented.  Routine colorectal cancer screening usually begins at 59 years of age and continues through 59 years of age.  Your health care provider may recommend screening at an earlier age if you have risk factors for colon cancer.  Your health care provider may also recommend using home test kits to check for hidden blood in the stool.  A small camera at the end of a tube can be used to examine your  colon directly (sigmoidoscopy or colonoscopy). This is done to check for the earliest forms of colorectal cancer.  Routine screening usually begins at age 46.  Direct examination of the colon should be repeated every 5-10 years through 59 years of age. However, you may need to be screened more often if early forms of precancerous polyps or small growths are found. Skin Cancer  Check your skin from head to toe regularly.  Tell your health care provider about any new moles or changes in moles, especially if there is a change in a mole's shape or color.  Also tell your health care provider if you have a mole that is larger than the size of a pencil eraser.  Always use sunscreen. Apply sunscreen liberally and repeatedly throughout the day.  Protect yourself by wearing long sleeves, pants, a wide-brimmed hat, and sunglasses whenever you are outside. Heart disease, diabetes, and high blood pressure  High blood pressure causes heart disease and increases the risk of  stroke. High blood pressure is more likely to develop in: ? People who have blood pressure in the high end of the normal range (130-139/85-89 mm Hg). ? People who are overweight or obese. ? People who are African American.  If you are 75-2 years of age, have your blood pressure checked every 3-5 years. If you are 41 years of age or older, have your blood pressure checked every year. You should have your blood pressure measured twice-once when you are at a hospital or clinic, and once when you are not at a hospital or clinic. Record the average of the two measurements. To check your blood pressure when you are not at a hospital or clinic, you can use: ? An automated blood pressure machine at a pharmacy. ? A home blood pressure monitor.  If you are between 2 years and 85 years old, ask your health care provider if you should take aspirin to prevent strokes.  Have regular diabetes screenings. This involves taking a blood sample to  check your fasting blood sugar level. ? If you are at a normal weight and have a low risk for diabetes, have this test once every three years after 59 years of age. ? If you are overweight and have a high risk for diabetes, consider being tested at a younger age or more often. Preventing infection Hepatitis B  If you have a higher risk for hepatitis B, you should be screened for this virus. You are considered at high risk for hepatitis B if: ? You were born in a country where hepatitis B is common. Ask your health care provider which countries are considered high risk. ? Your parents were born in a high-risk country, and you have not been immunized against hepatitis B (hepatitis B vaccine). ? You have HIV or AIDS. ? You use needles to inject street drugs. ? You live with someone who has hepatitis B. ? You have had sex with someone who has hepatitis B. ? You get hemodialysis treatment. ? You take certain medicines for conditions, including cancer, organ transplantation, and autoimmune conditions. Hepatitis C  Blood testing is recommended for: ? Everyone born from [birth year] through 1967. ? Anyone with known risk factors for hepatitis C. Sexually transmitted infections (STIs)  You should be screened for sexually transmitted infections (STIs) including gonorrhea and chlamydia if: ? You are sexually active and are younger than 60 years of age. ? You are older than 59 years of age and your health care provider tells you that you are at risk for this type of infection. ? Your sexual activity has changed since you were last screened and you are at an increased risk for chlamydia or gonorrhea. Ask your health care provider if you are at risk.  If you do not have HIV, but are at risk, it may be recommended that you take a prescription medicine daily to prevent HIV infection. This is called pre-exposure prophylaxis (PrEP). You are considered at risk if: ? You are sexually active and do not regularly use  condoms or know the HIV status of your partner(s). ? You take drugs by injection. ? You are sexually active with a partner who has HIV. Talk with your health care provider about whether you are at high risk of being infected with HIV. If you choose to begin PrEP, you should first be tested for HIV. You should then be tested every 3 months for as long as you are taking PrEP. Pregnancy  If you are premenopausal  and you may become pregnant, ask your health care provider about preconception counseling.  If you may become pregnant, take 400 to 800 micrograms (mcg) of folic acid every day.  If you want to prevent pregnancy, talk to your health care provider about birth control (contraception). Osteoporosis and menopause  Osteoporosis is a disease in which the bones lose minerals and strength with aging. This can result in serious bone fractures. Your risk for osteoporosis can be identified using a bone density scan.  If you are 63 years of age or older, or if you are at risk for osteoporosis and fractures, ask your health care provider if you should be screened.  Ask your health care provider whether you should take a calcium or vitamin D supplement to lower your risk for osteoporosis.  Menopause may have certain physical symptoms and risks.  Hormone replacement therapy may reduce some of these symptoms and risks. Talk to your health care provider about whether hormone replacement therapy is right for you. Follow these instructions at home:  Schedule regular health, dental, and eye exams.  Stay current with your immunizations.  Do not use any tobacco products including cigarettes, chewing tobacco, or electronic cigarettes.  If you are pregnant, do not drink alcohol.  If you are breastfeeding, limit how much and how often you drink alcohol.  Limit alcohol intake to no more than 1 drink per day for nonpregnant women. One drink equals 12 ounces of beer, 5 ounces of wine, or 1 ounces of  hard liquor.  Do not use street drugs.  Do not share needles.  Ask your health care provider for help if you need support or information about quitting drugs.  Tell your health care provider if you often feel depressed.  Tell your health care provider if you have ever been abused or do not feel safe at home. This information is not intended to replace advice given to you by your health care provider. Make sure you discuss any questions you have with your health care provider. Document Released: 11/08/2010 Document Revised: 10/01/2015 Document Reviewed: 01/27/2015 Elsevier Interactive Patient Education  2019 [employer].

## 2018-05-04 NOTE — Assessment & Plan Note (Signed)
Flu shot up to date. Shingrix counseled. Tetanus up to date. Colonoscopy needs done. Mammogram up to date, pap smear up to date. Counseled about sun safety and mole surveillance. Counseled about the dangers of distracted driving. Given 10 year screening recommendations.

## 2018-05-04 NOTE — Progress Notes (Signed)
   Subjective:   Patient ID: Cassie [name], female    DOB: July 14, 1960, 57 y.o.   MRN: [id number]  HPI The patient is a 57 YO female coming in for physical. Still having SOB and getting nocturnal pulseoximetry soon.   PMH, [facility], social history reviewed and updated  Review of Systems  Constitutional: Positive for activity change and fatigue. Negative for appetite change.  HENT: Negative.   Eyes: Negative.   Respiratory: Positive for shortness of breath. Negative for cough and chest tightness.   Cardiovascular: Negative for chest pain, palpitations and leg swelling.  Gastrointestinal: Negative for abdominal distention, abdominal pain, constipation, diarrhea, nausea and vomiting.  Musculoskeletal: Positive for arthralgias, back pain and gait problem.  Skin: Negative.   Neurological: Negative for dizziness, tremors, seizures, weakness and numbness.  Psychiatric/Behavioral: Negative.     Objective:  Physical Exam Constitutional:      Appearance: She is well-developed.     Comments: Overweight  HENT:     Head: Normocephalic and atraumatic.  Neck:     Musculoskeletal: Normal range of motion.  Cardiovascular:     Rate and Rhythm: Normal rate and regular rhythm.  Pulmonary:     Effort: Pulmonary effort is normal. No respiratory distress.     Breath sounds: Normal breath sounds. No wheezing or rales.  Abdominal:     General: Bowel sounds are normal. There is no distension.     Palpations: Abdomen is soft.     Tenderness: There is no abdominal tenderness. There is no rebound.  Musculoskeletal:        General: Tenderness present.     Right lower leg: Edema present.     Left lower leg: Edema present.  Skin:    General: Skin is warm and dry.  Neurological:     Mental Status: She is alert and oriented to person, place, and time.     Coordination: Coordination normal.     Vitals:   05/04/18 1043  BP: 124/80  Pulse: 62  Temp: 98.1 F (36.7 C)  TempSrc: Oral  SpO2: 98%    Weight: (!) 317 lb (143.8 kg)  Height: 5' 5"  (1.651 m)    Assessment & Plan:

## 2018-05-04 NOTE — Assessment & Plan Note (Signed)
Cardiology is currently evaluating for obesity hypoventilation syndrome with nocturnal pulse ox and then sleep titration if needed.

## 2018-05-04 NOTE — Assessment & Plan Note (Signed)
Declines to try PPI although other possibilities except hypoventilation from obesity have been mostly ruled out as she does not feel she has GERD.

## 2018-05-04 NOTE — Assessment & Plan Note (Signed)
Refill alprazolam and counseled again about risk and benefit including risk of addiction, dependence, increased risk of falls and memory changes. She elects to continue.

## 2018-05-06 ENCOUNTER — Other Ambulatory Visit: Payer: Self-pay | Admitting: Family

## 2018-05-17 DIAGNOSIS — G4733 Obstructive sleep apnea (adult) (pediatric): Secondary | ICD-10-CM | POA: Diagnosis not present

## 2018-05-21 ENCOUNTER — Encounter: Payer: Self-pay | Admitting: Hematology & Oncology

## 2018-05-21 ENCOUNTER — Other Ambulatory Visit: Payer: Self-pay

## 2018-05-21 ENCOUNTER — Inpatient Hospital Stay: Payer: 59

## 2018-05-21 ENCOUNTER — Telehealth: Payer: Self-pay | Admitting: *Deleted

## 2018-05-21 ENCOUNTER — Inpatient Hospital Stay: Payer: 59 | Attending: Hematology & Oncology | Admitting: Hematology & Oncology

## 2018-05-21 ENCOUNTER — Telehealth: Payer: Self-pay | Admitting: Internal Medicine

## 2018-05-21 VITALS — BP 104/48 | HR 60 | Temp 98.8°F | Resp 19 | Wt 325.0 lb

## 2018-05-21 DIAGNOSIS — Z7901 Long term (current) use of anticoagulants: Secondary | ICD-10-CM | POA: Diagnosis not present

## 2018-05-21 DIAGNOSIS — M7989 Other specified soft tissue disorders: Secondary | ICD-10-CM

## 2018-05-21 DIAGNOSIS — Z86711 Personal history of pulmonary embolism: Secondary | ICD-10-CM | POA: Diagnosis not present

## 2018-05-21 DIAGNOSIS — R635 Abnormal weight gain: Secondary | ICD-10-CM | POA: Insufficient documentation

## 2018-05-21 DIAGNOSIS — I2699 Other pulmonary embolism without acute cor pulmonale: Secondary | ICD-10-CM

## 2018-05-21 DIAGNOSIS — R0602 Shortness of breath: Secondary | ICD-10-CM | POA: Insufficient documentation

## 2018-05-21 DIAGNOSIS — I471 Supraventricular tachycardia: Secondary | ICD-10-CM

## 2018-05-21 LAB — CBC WITH DIFFERENTIAL (CANCER CENTER ONLY)
Abs Immature Granulocytes: 0.01 10*3/uL (ref 0.00–0.07)
Basophils Absolute: 0 10*3/uL (ref 0.0–0.1)
Basophils Relative: 0 %
Eosinophils Absolute: 0.3 10*3/uL (ref 0.0–0.5)
Eosinophils Relative: 4 %
HCT: 39.6 % (ref 36.0–46.0)
HEMOGLOBIN: 12.2 g/dL (ref 12.0–15.0)
Immature Granulocytes: 0 %
Lymphocytes Relative: 25 %
Lymphs Abs: 1.7 10*3/uL (ref 0.7–4.0)
MCH: 29.5 pg (ref 26.0–34.0)
MCHC: 30.8 g/dL (ref 30.0–36.0)
MCV: 95.7 fL (ref 80.0–100.0)
MONO ABS: 0.6 10*3/uL (ref 0.1–1.0)
Monocytes Relative: 9 %
Neutro Abs: 4.3 10*3/uL (ref 1.7–7.7)
Neutrophils Relative %: 62 %
Platelet Count: 279 10*3/uL (ref 150–400)
RBC: 4.14 MIL/uL (ref 3.87–5.11)
RDW: 14.3 % (ref 11.5–15.5)
WBC Count: 7 10*3/uL (ref 4.0–10.5)
nRBC: 0 % (ref 0.0–0.2)

## 2018-05-21 LAB — CMP (CANCER CENTER ONLY)
ALK PHOS: 81 U/L (ref 38–126)
ALT: 10 U/L (ref 0–44)
ANION GAP: 8 (ref 5–15)
AST: 10 U/L — ABNORMAL LOW (ref 15–41)
Albumin: 3.7 g/dL (ref 3.5–5.0)
BILIRUBIN TOTAL: 0.3 mg/dL (ref 0.3–1.2)
BUN: 9 mg/dL (ref 6–20)
CO2: 28 mmol/L (ref 22–32)
Calcium: 8.9 mg/dL (ref 8.9–10.3)
Chloride: 106 mmol/L (ref 98–111)
Creatinine: 0.84 mg/dL (ref 0.44–1.00)
GFR, Estimated: >60 mL/min (ref >60)
Glucose, Bld: 109 mg/dL — ABNORMAL HIGH (ref 70–99)
Potassium: 4.3 mmol/L (ref 3.5–5.1)
SODIUM: 142 mmol/L (ref 135–145)
TOTAL PROTEIN: 7.2 g/dL (ref 6.5–8.1)

## 2018-05-21 LAB — D-DIMER, QUANTITATIVE: D-Dimer, Quant: 0.27 ug/mL-FEU (ref 0.00–0.50)

## 2018-05-21 NOTE — Telephone Encounter (Signed)
Patient calling  Would like to know if she should take atenolol medication - has not taken yet  Patient has also sent a mychart message with more details  Pt c/o BP issue: STAT if pt c/o blurred vision, one-sided weakness or slurred speech  1. What are your last 5 BP readings?  Today 1/13 122/44 122/60  2. Are you having any other symptoms (ex. Dizziness, headache, blurred vision, passed out)? A little lightheaded   3. What is your BP issue? BP has been running high

## 2018-05-21 NOTE — Telephone Encounter (Signed)
Returned call to pt, she reported at Hematology appt this am she had a BP reading of 104/48 (no atenolol).  She feels it has been trending low over the past 2 months. She has intermittent dizziness.  She denies chest pain or SOB. She has not taken atenolol today and wants to be advised on how to proceed.  She went home and took BP. Readings were 122/44, 122/60 (no atenolol).  She was d/t have f/u in Nov and was unable because of scheduling conflicts. I have moved her up from march 27th to 05/23/18 with Dr. Saunders Revel.   She will continue to take BP readings and agreed to seek medical care if she has worsening sx.   Advised pt to call for any further questions or concerns

## 2018-05-21 NOTE — Telephone Encounter (Signed)
Message received from patient concerned that her diastolic BP was 48 this AM at appt with Dr. Marin Olp.  She stated that when she got home, her husband checked her BP and diastolic was 44 and 60.  Dr. Marin Olp notified of patient's concerns and order received for pt to inform her PCP about her BP.  Call placed to patient and patient instructed to notify her PCP about her blood pressure per Dr. Marin Olp. Patient states that she has already sent out a message to her cardiologist about her BP and Atenolol dose.  Patient appreciative of call back and has no further questions at this time.

## 2018-05-21 NOTE — Telephone Encounter (Signed)
Patient calling back with update :   Current bp is 122/76 she is concerned that holding atenolol will cause HTN .  Patient also c/o heavy heart beat

## 2018-05-21 NOTE — Progress Notes (Signed)
Hematology and Oncology Follow Up Visit  Cassie [name] [id number] 06/26/1960 58 y.o. 05/21/2018   Principle Diagnosis:  Idiopathic right lower lobe pulmonary embolism Ulcerative colitis  Current Therapy:   ELIQUIS 5 mg by mouth twice a day    Interim History:  Ms. Spillers is here today with her husband for follow-up.  So far, she is doing okay.  Her real problem is her weight.  She is not losing weight.  I think her weight is clearly the etiology of a lot of her problems.  She is going to have some dental work done this week.  She will have a scaling done.  I told her to stop the Eliquis the day before the procedure.  She still has some chronic shortness of breath.  Again, I suspect the weight gain is the etiology of this.  She thinks there is a lymph node in the left neck.  I palpated this area.  I really cannot find anything that was suggestive of lymphadenopathy.  I recommended a CT scan to her.  She would like to hold off on this for right now.  She and her family had a quiet Christmas and New Year's.  There is been no flareups of her colitis.  She says that she gets these skin lesions from the Eliquis.  These skin lesions are being taken off as needed by dermatology.  She has problems with leg swelling.  I do not think this is anything related to her issue with blood clots.  Again, I think this is more so weight gain.  ECOG Performance Status: 1 - Symptomatic but completely ambulatory  Medications:  Allergies as of 05/21/2018      Reactions   Doxycycline Palpitations   Hydrocodone Other (See Comments)   Hallucinations    Sudafed [pseudoephedrine Hcl] Shortness Of Breath   Acular [ketorolac Tromethamine]    Unknown reaction   Diphenhydramine Hcl Swelling   Throat feels like it swells    Lactose Intolerance (gi) Diarrhea, Other (See Comments)   bloating   Mesalamine Other (See Comments)   Hair loss   Sulfasalazine Diarrhea   Azithromycin Palpitations   Speeding  heart rate per pt.   Caffeine Palpitations   Penicillins Rash   Has patient had a PCN reaction causing immediate rash, facial/tongue/throat swelling, SOB or lightheadedness with hypotension: Yes Has patient had a PCN reaction causing severe rash involving mucus membranes or skin necrosis: No Has patient had a PCN reaction that required hospitalization Yes Has patient had a PCN reaction occurring within the last 10 years: No If all of the above answers are "NO", then may proceed with Cephalosporin use.   Prednisone Palpitations      Medication List       Accurate as of May 21, 2018  8:34 AM. Always use your most recent med list.        ALPRAZolam 1 MG tablet Commonly known as:  XANAX Take 0.5-1 tablets (0.5-1 mg total) by mouth 2 (two) times daily as needed for anxiety.   apixaban 5 MG Tabs tablet Commonly known as:  ELIQUIS Take 1 tablet (5 mg total) by mouth 2 (two) times daily.   ELIQUIS 5 MG Tabs tablet Generic drug:  apixaban TAKE 1 TABLET BY MOUTH TWICE DAILY   atenolol 50 MG tablet Commonly known as:  TENORMIN Take 1/2 tablet ( 82m ) by mouth 2 times per day   folic acid 8300MCG tablet Commonly known as:  FOLVITE Take 800  mcg by mouth daily.   furosemide 20 MG tablet Commonly known as:  LASIX Take 1 tablet (20 mg) daily as needed for swelling.   GAS-X PO Take 1 tablet by mouth 4 (four) times daily as needed (For gas.). Do not exceed 6 tablets in a 24 hour period.   IMODIUM A-D 2 MG tablet Generic drug:  loperamide Take 2 mg by mouth 4 (four) times daily as needed for diarrhea or loose stools.   PROBIOTIC DAILY PO Take 2 capsules by mouth daily with lunch.   SYSTANE OP Place 1 drop into both eyes daily as needed (dry eyes).   vitamin B-12 500 MCG tablet Commonly known as:  CYANOCOBALAMIN Take 500 mcg by mouth daily with lunch.   Vitamin D3 50 MCG (2000 UT) capsule Take 4,000 Units by mouth daily with lunch.       Allergies:  Allergies    Allergen Reactions  . Doxycycline Palpitations  . Hydrocodone Other (See Comments)    Hallucinations   . Sudafed [Pseudoephedrine Hcl] Shortness Of Breath  . Acular [Ketorolac Tromethamine]     Unknown reaction   . Diphenhydramine Hcl Swelling    Throat feels like it swells   . Lactose Intolerance (Gi) Diarrhea and Other (See Comments)    bloating  . Mesalamine Other (See Comments)    Hair loss  . Sulfasalazine Diarrhea  . Azithromycin Palpitations    Speeding heart rate per pt.   . Caffeine Palpitations  . Penicillins Rash    Has patient had a PCN reaction causing immediate rash, facial/tongue/throat swelling, SOB or lightheadedness with hypotension: Yes Has patient had a PCN reaction causing severe rash involving mucus membranes or skin necrosis: No Has patient had a PCN reaction that required hospitalization Yes Has patient had a PCN reaction occurring within the last 10 years: No If all of the above answers are "NO", then may proceed with Cephalosporin use.  . Prednisone Palpitations    Past Medical History, Surgical history, Social history, and Family History were reviewed and updated.  Review of Systems: Review of Systems  Constitutional: Negative.   HENT: Positive for congestion.   Eyes: Negative.   Respiratory: Positive for shortness of breath.   Cardiovascular: Positive for leg swelling.  Gastrointestinal: Positive for diarrhea.  Genitourinary: Negative.   Musculoskeletal: Negative.   Skin: Negative.   Neurological: Negative.   Endo/Heme/Allergies: Negative.   Psychiatric/Behavioral: Negative.       Physical Exam:  weight is 325 lb (147.4 kg) (abnormal). Her tympanic temperature is 98.8 F (37.1 C). Her blood pressure is 104/48 (abnormal) and her pulse is 60. Her respiration is 19 and oxygen saturation is 100%.   Wt Readings from Last 3 Encounters:  05/21/18 (!) 325 lb (147.4 kg)  05/04/18 (!) 317 lb (143.8 kg)  03/12/18 (!) 317 lb (143.8 kg)     Ocular: Sclerae unicteric, pupils equal, round and reactive to light Ear-nose-throat: Oropharynx clear, dentition fair Lymphatic: No cervical, supraclavicular or axillary adenopathy Lungs no rales or rhonchi, good excursion bilaterally Heart regular rate and rhythm, no murmur appreciated Abd soft, nontender, positive bowel sounds, no liver or spleen tip palpated on exam, no fluid wave  MSK no focal spinal tenderness, no joint edema Neuro: non-focal, well-oriented, appropriate affect Breasts: Deferred   Lab Results  Component Value Date   WBC 7.0 05/21/2018   HGB 12.2 05/21/2018   HCT 39.6 05/21/2018   MCV 95.7 05/21/2018   PLT 279 05/21/2018   Lab Results  Component Value Date   IRON 72 07/17/2013   IRONPCTSAT 24.5 07/17/2013   Lab Results  Component Value Date   RBC 4.14 05/21/2018   No results found for: Nils Pyle [facility] Lab Results  Component Value Date   IGA 341 05/25/2015   No results found for: Odetta Pink, SPEI   Chemistry      Component Value Date/Time   NA 139 11/27/2017 0811   NA 139 01/24/2017 0929   K 4.0 11/27/2017 0811   K 4.0 01/24/2017 0929   CL 103 11/27/2017 0811   CL 103 10/10/2016 1403   CL 105 09/09/2016 1332   CO2 27 11/27/2017 0811   CO2 24 01/24/2017 0929   BUN 11 11/27/2017 0811   BUN 9.5 01/24/2017 0929   CREATININE 0.81 11/27/2017 0811   CREATININE 0.8 01/24/2017 0929      Component Value Date/Time   CALCIUM 9.5 11/27/2017 0811   CALCIUM 9.7 01/24/2017 0929   ALKPHOS 104 11/27/2017 0811   ALKPHOS 107 01/24/2017 0929   AST 19 11/27/2017 0811   AST 18 01/24/2017 0929   ALT 14 11/27/2017 0811   ALT 16 01/24/2017 0929   BILITOT 0.3 11/27/2017 0811   BILITOT 0.31 01/24/2017 0929      Impression and Plan: Ms. Gladman is a pleasant 58 yo caucasian female with history of PE. She continues to do well on Eliquis 5 mg PO BID. She will continue her same  regimen.  We will plan to see her back in another 6 months for follow-up.  They will contact our office with any questions or concerns. We can certainly see her sooner if need be.   Volanda Napoleon, MD 1/13/20208:34 AM

## 2018-05-21 NOTE — Telephone Encounter (Signed)
Called Cassie [name] back to review suggestions from Dr. Saunders Revel. She verbalized understanding and will keep her appt on 05/23/18. Advised Cassie [name] to call for any further questions or concerns

## 2018-05-21 NOTE — Telephone Encounter (Signed)
Let's have her hold atenolol until she is seen in the office later this week.  She should let us know if her blood pressure drops further and/or she becomes symptomatic.  Nelva Bush, MD [facility] Pager: [phone]

## 2018-05-22 NOTE — Telephone Encounter (Signed)
Called patient. She's been keeping up with there blood pressure since yesterday afternoon. Last night at 10pm 122/64 and this morning at 07:30am was 122/70, HR 62. Patient is afraid to continue holding atenolol in fear that her BP will increase. Advised patient that if these numbers continue to remain similar to these values, she can continue to hold atenolol until she sees Dr End tomorrow. Patient verbalized understanding and was appreciative.

## 2018-05-23 ENCOUNTER — Encounter: Payer: Self-pay | Admitting: Internal Medicine

## 2018-05-23 ENCOUNTER — Ambulatory Visit: Payer: 59 | Admitting: Internal Medicine

## 2018-05-23 ENCOUNTER — Encounter

## 2018-05-23 VITALS — BP 121/71 | HR 59 | Ht 65.0 in | Wt 322.5 lb

## 2018-05-23 DIAGNOSIS — I491 Atrial premature depolarization: Secondary | ICD-10-CM | POA: Diagnosis not present

## 2018-05-23 DIAGNOSIS — Q245 Malformation of coronary vessels: Secondary | ICD-10-CM | POA: Diagnosis not present

## 2018-05-23 DIAGNOSIS — R002 Palpitations: Secondary | ICD-10-CM | POA: Diagnosis not present

## 2018-05-23 DIAGNOSIS — I493 Ventricular premature depolarization: Secondary | ICD-10-CM

## 2018-05-23 DIAGNOSIS — I959 Hypotension, unspecified: Secondary | ICD-10-CM

## 2018-05-23 DIAGNOSIS — I471 Supraventricular tachycardia: Secondary | ICD-10-CM

## 2018-05-23 DIAGNOSIS — G4733 Obstructive sleep apnea (adult) (pediatric): Secondary | ICD-10-CM

## 2018-05-23 DIAGNOSIS — R600 Localized edema: Secondary | ICD-10-CM

## 2018-05-23 NOTE — Progress Notes (Signed)
Follow-up Outpatient Visit Date: 05/23/2018  Primary Care Provider: Hoyt Koch, MD [place] [id number]  Chief Complaint: Low blood pressure and palpitations  HPI:  Cassie [name] is a 58 y.o. year-old female with history of  palpitations and PVCs, chest pain with myocardial bridging in the mid LAD, pulmonary embolism on apixaban, ulcerative colitis, anxiety, vitamin B12 deficiency, vitamin D deficiency, and morbid obesity, who presents for evaluation of hypotension.  I last saw her in 08/2017, at which time she felt that her palpitations had increased in frequency and intensity.  We discussed referral to EP, which she declined.  Subsequent sleep study showed mild OSA with nocternal hypoxia.  Cassie [name] did not wish to move forward with CPAP titration and was seen by Dr. Radford Pax to review her sleep study results.  Noctornal pulseoximetry was recommended.  Cassie [name] seen by Dr. Marin Olp 2 days ago and was noted to have low BP (104/48).  She continued to have soft diastolic readings at home and presents today for further evaluation.  Today, Cassie [name] reports feeling relatively well.  Her chronic DOE is stable.  She continues to have intermittent palpitations that she describes as fluttering lasting a few seconds to minutes.  Frequency has increased slightly over the last few months.  She denies chest pain but notes occasional vague feeling in her chest that it not exertional.  At the time of her low BP readings on Monday, she was asymptomatic.  Since then, she has cut atenolol from 25 mg BID to 12.5 mg BID and overall feels about the same.  Her BP is back near her baseline.  Cassie [name] has chronic BLE edema (R>L), which seems worse recently.  She has worn compression stockings in the past and has also tried to elevate her legs.  These measures have previously helped but are not doing as much now.  She uses furosemide intermittently with varying degrees of urine  output.  After her last dose, she also developed diarrhea, which concerned her given her history of UC.  --------------------------------------------------------------------------------------------------  Cardiovascular History & Procedures: Cardiovascular Problems:  Dyspnea on exertion  Palpitations  Pulmonary embolism  Myocardial bridge  Obstructive sleep apnea (follows with Dr. Radford Pax)  Risk Factors:  Coronary artery calcium on CT, obesity, and sedentary lifestyle  Cath/PCI:  None  CV Surgery:  None  EP Procedures and Devices:  30-day event monitor (03/27/17):Predominantly sinus rhythm with PACs, PVCs, and brief paroxysmal atrial tachycardia. No sustained arrhythmia or prolonged pause.  48-hour Holter monitor:Predominantly sinus rhythm with rare PACs, PVCs, and very short atrial runs. No significant arrhythmia.  Non-Invasive Evaluation(s):  Cardiac CTA (11/25/16): Normal aorta. Trileaflet aortic valve without calcification. Right dominant coronary arteries with normal origins. LMCA is normal. LAD has mild calcified plaque in the proximal segment (0-25%). Mid LAD has a 12 mm intramyocardial bridge. CT FFR of distal LAD 0.8. LCx is nondominant and normal. RCA is large with minimal calcification and no significant stenosis. Coronary artery calcium score 54.  CTA chest (09/09/16): No PE. Coronary artery calcification involving the LAD is noted. No significant lung abnormalities.  TTE (07/04/16): Nromal LV size and contraction (LVEF 55-60%) with normal diastolic function. Normal RV size and function. No significant valvular abnormalities.  Pharmacologic MPI (01/01/04): Abnormal study without ischemia or scar. No significant EKG abnormalities. Normal LV size and function.  Recent CV Pertinent Labs: Lab Results  Component Value Date   CHOL 175 12/25/2015   HDL 40.30 12/25/2015  LDLCALC 114 (H) 12/25/2015   TRIG 103.0 12/25/2015   CHOLHDL 4 12/25/2015   BNP 54.4  01/05/2017   K 4.3 05/21/2018   K 4.0 01/24/2017   MG 2.1 02/07/2011   BUN 9 05/21/2018   BUN 9.5 01/24/2017   CREATININE 0.84 05/21/2018   CREATININE 0.8 01/24/2017    Past medical and surgical history were reviewed and updated in EPIC.  No outpatient medications have been marked as taking for the 05/23/18 encounter (Appointment) with Cassie [name], Harrell Gave, MD.    Allergies: Doxycycline; Hydrocodone; Sudafed [pseudoephedrine hcl]; Acular [ketorolac tromethamine]; Diphenhydramine hcl; Lactose intolerance (gi); Mesalamine; Sulfasalazine; Azithromycin; Caffeine; Penicillins; and Prednisone  Social History   Tobacco Use  . Smoking status: Former Smoker    Packs/day: 2.00    Years: 12.00    Pack years: 24.00    Types: Cigarettes    Last attempt to quit: 03/07/1979    Years since quitting: 39.2  . Smokeless tobacco: Never Used  Substance Use Topics  . Alcohol use: No    Alcohol/week: 0.0 standard drinks  . Drug use: No    Family History  Problem Relation Age of Onset  . Heart disease Father 3       pacemaker  . Atrial fibrillation Father   . Hypertension Maternal Aunt   . Hypertension Maternal Grandfather   . Colon cancer Neg Hx   . Stomach cancer Neg Hx   . Colon polyps Neg Hx   . Esophageal cancer Neg Hx   . Rectal cancer Neg Hx     Review of Systems: A 12-system review of systems was performed and was negative except as noted in the HPI.  --------------------------------------------------------------------------------------------------  Physical Exam: There were no vitals taken for this visit.  General:  NAD.  Accompanied by her husband. HEENT: No conjunctival pallor or scleral icterus. Moist mucous membranes.  OP clear. Neck: Supple without lymphadenopathy, thyromegaly, JVD, or HJR. Lungs: Normal work of breathing. Clear to auscultation bilaterally without wheezes or crackles. Heart: Distant heart sounds; RRR w/o m/r/g.  Unable to assess PMI due to body  habitus. Abd: Bowel sounds present. Soft, NT/ND.  Unable to assess HSM due to body habitus. Ext: Trace pretibial edema bilaterally. Skin: Warm and dry without rash.  EKG:  Sinus bradycardia (HR 59 bpm).  No significant abnormality.  Lab Results  Component Value Date   WBC 7.0 05/21/2018   HGB 12.2 05/21/2018   HCT 39.6 05/21/2018   MCV 95.7 05/21/2018   PLT 279 05/21/2018    Lab Results  Component Value Date   NA 142 05/21/2018   K 4.3 05/21/2018   CL 106 05/21/2018   CO2 28 05/21/2018   BUN 9 05/21/2018   CREATININE 0.84 05/21/2018   GLUCOSE 109 (H) 05/21/2018   ALT 10 05/21/2018    Lab Results  Component Value Date   CHOL 175 12/25/2015   HDL 40.30 12/25/2015   LDLCALC 114 (H) 12/25/2015   TRIG 103.0 12/25/2015   CHOLHDL 4 12/25/2015    --------------------------------------------------------------------------------------------------  ASSESSMENT AND PLAN: Palpitations with PAC's, PVC's, and PAT Symptoms slightly worse than at our last visit.  We discussed trial of a different beta blocker as well as switching to a calcium channel blocker.  Ms. Deol reports not tolerating CCB's well in the past.  We will continue atenolol 12.5 mg BID; she can increase this back to 25 mg BID if her palpitations worsen or BP rises.  We discussed referral to EP to discuss antiarrhythmic therapy but  have agreed to defer this.  Myocardial bridge No significant chest pain.  Continue HR control with atenolol.  OSA Mild OSA, though extended periods of nocternal hypoxia were noted.  Results of nocturnal pulseoxymetry are pending.  Patient to f/u with Dr. Radford Pax.  Hypotension BP normal today.  No significant orthostatic changes.  No symptoms reported during recent episode of low BP.  Continue with atenolol 12.5 mg BID.  BLE edema Likely multifactorial, including venous stasis and some degree of post-thrombotic syndrome in the setting of prior PE that likely originated from DVT.  I  encouraged sodium restriction, leg elevation and compression stocking use.  Ms. Scarpelli can continue to use prn furosemide.  If swelling or discomfort worsen, she may need evaluation by vascular surgery.  Continue apixaban per Dr. Marin Olp.  Morbid obesity This most likely underlies many of Ms. Umeda's complaints.  I have encouraged weight loss through diet and exercise.  Follow-up: RTC 6 months.  Nelva Bush, MD 05/23/2018 8:20 AM

## 2018-05-23 NOTE — Patient Instructions (Signed)
Medication Instructions:  Your physician has recommended you make the following change in your medication:  1- CHANGE Atenolol to 12.5 mg by mouth two times a day.  If you need a refill on your cardiac medications before your next appointment, please call your pharmacy.   Lab work: none If you have labs (blood work) drawn today and your tests are completely normal, you will receive your results only by: Marland Kitchen MyChart Message (if you have MyChart) OR . A paper copy in the mail If you have any lab test that is abnormal or we need to change your treatment, we will call you to review the results.  Testing/Procedures: none  Follow-Up: At [facility], you and your health needs are our priority.  As part of our continuing mission to provide you with exceptional heart care, we have created designated Provider Care Teams.  These Care Teams include your primary Cardiologist (physician) and Advanced Practice Providers (APPs -  Physician Assistants and Nurse Practitioners) who all work together to provide you with the care you need, when you need it. You will need a follow up appointment in 6 months.  Please call our office 2 months in advance to schedule this appointment.  You may see Nelva Bush, MD or one of the following Advanced Practice Providers on your designated Care Team:   Murray Hodgkins, NP Christell Faith, PA-C . Marrianne Mood, PA-C

## 2018-05-25 ENCOUNTER — Encounter: Payer: Self-pay | Admitting: Hematology & Oncology

## 2018-05-30 ENCOUNTER — Encounter: Payer: 59 | Admitting: Internal Medicine

## 2018-06-04 ENCOUNTER — Other Ambulatory Visit: Payer: Self-pay | Admitting: Family

## 2018-06-12 ENCOUNTER — Telehealth: Payer: Self-pay

## 2018-06-12 ENCOUNTER — Other Ambulatory Visit: Payer: Self-pay | Admitting: Internal Medicine

## 2018-06-12 MED ORDER — ATENOLOL 25 MG PO TABS: 12.5000 mg | ORAL_TABLET | Freq: Two times a day (BID) | ORAL | 2 refills | Status: DC

## 2018-06-12 NOTE — Telephone Encounter (Signed)
Patient requested a refill for Atenolol 50 mg 1/2 tablet (70m) twice a day. The patient's most recent office visit stated, to decrease to Atenolol 12.5 mg twice a day. Patient stated she wanted 50 mg tablets and she would continue to cut it to 1/4 to equal 12.5 mg.  [facility] review and [facility]

## 2018-06-12 NOTE — Telephone Encounter (Signed)
Called and spoke with patient. She would like to have the atenolol 50 mg tablet in order to save herself on money getting the pills. However, at last office visit she was reduced to 12.5 mg two times a day. Advised it is not recommended to quarter a pill for the proper dose because it is not very accurate. Advised I would send in prescription for the 25 mg for her to split in half. She went on to talk about the different brands of pills and which ones she liked best. She was appreciative for the refill.

## 2018-06-13 ENCOUNTER — Encounter: Payer: Self-pay | Admitting: Internal Medicine

## 2018-06-14 ENCOUNTER — Other Ambulatory Visit (INDEPENDENT_AMBULATORY_CARE_PROVIDER_SITE_OTHER): Payer: 59

## 2018-06-14 ENCOUNTER — Encounter: Payer: Self-pay | Admitting: Internal Medicine

## 2018-06-14 DIAGNOSIS — Z0000 Encounter for general adult medical examination without abnormal findings: Secondary | ICD-10-CM

## 2018-06-14 DIAGNOSIS — Z1231 Encounter for screening mammogram for malignant neoplasm of breast: Secondary | ICD-10-CM | POA: Diagnosis not present

## 2018-06-14 LAB — LIPID PANEL
Cholesterol: 149 mg/dL (ref 0–200)
HDL: 33.2 mg/dL — ABNORMAL LOW (ref >39.00)
LDL Cholesterol: 98 mg/dL (ref 0–99)
NonHDL: 115.64
Total CHOL/HDL Ratio: 4
Triglycerides: 87 mg/dL (ref 0.0–149.0)
VLDL: 17.4 mg/dL (ref 0.0–40.0)

## 2018-06-14 LAB — HEMOGLOBIN A1C: Hgb A1c MFr Bld: 5.7 % (ref 4.6–6.5)

## 2018-06-14 LAB — HM MAMMOGRAPHY

## 2018-06-15 ENCOUNTER — Encounter: Payer: Self-pay | Admitting: Internal Medicine

## 2018-06-15 NOTE — Progress Notes (Signed)
Abstracted and sent to scan  

## 2018-06-30 ENCOUNTER — Other Ambulatory Visit: Payer: Self-pay | Admitting: Family

## 2018-07-04 MED ORDER — ATENOLOL 50 MG PO TABS: 25.0000 mg | ORAL_TABLET | Freq: Two times a day (BID) | ORAL | 11 refills | Status: DC

## 2018-07-05 DIAGNOSIS — H43822 Vitreomacular adhesion, left eye: Secondary | ICD-10-CM | POA: Diagnosis not present

## 2018-07-05 DIAGNOSIS — H35371 Puckering of macula, right eye: Secondary | ICD-10-CM | POA: Diagnosis not present

## 2018-07-22 ENCOUNTER — Other Ambulatory Visit: Payer: Self-pay | Admitting: Family

## 2018-07-22 ENCOUNTER — Encounter: Payer: Self-pay | Admitting: Hematology & Oncology

## 2018-07-23 ENCOUNTER — Encounter: Payer: Self-pay | Admitting: Hematology & Oncology

## 2018-07-23 ENCOUNTER — Other Ambulatory Visit: Payer: Self-pay | Admitting: *Deleted

## 2018-07-23 MED ORDER — APIXABAN 5 MG PO TABS: 5.0000 mg | ORAL_TABLET | Freq: Two times a day (BID) | ORAL | 1 refills | Status: DC

## 2018-08-03 ENCOUNTER — Ambulatory Visit: Payer: 59 | Admitting: Internal Medicine

## 2018-08-16 ENCOUNTER — Encounter: Payer: Self-pay | Admitting: Internal Medicine

## 2018-08-16 MED ORDER — ALPRAZOLAM 1 MG PO TABS: 0.5000 mg | ORAL_TABLET | Freq: Two times a day (BID) | ORAL | 4 refills | Status: DC | PRN

## 2018-10-08 ENCOUNTER — Telehealth: Payer: Self-pay | Admitting: Internal Medicine

## 2018-10-08 NOTE — Telephone Encounter (Signed)
Virtual Visit Pre-Appointment Phone Call  "(Name), I am calling you today to discuss your upcoming appointment. We are currently trying to limit exposure to the virus that causes COVID-19 by seeing patients at home rather than in the office."  1. "What is the BEST phone number to call the day of the visit?" - include this in appointment notes  2. Do you have or have access to (through a family member/friend) a smartphone with video capability that we can use for your visit?" a. If yes - list this number in appt notes as cell (if different from BEST phone #) and list the appointment type as a VIDEO visit in appointment notes b. If no - list the appointment type as a PHONE visit in appointment notes  3. Confirm consent - "In the setting of the current Covid19 crisis, you are scheduled for a (phone or video) visit with your provider on (date) at (time).  Just as we do with many in-office visits, in order for you to participate in this visit, we must obtain consent.  If you'd like, I can send this to your mychart (if signed up) or email for you to review.  Otherwise, I can obtain your verbal consent now.  All virtual visits are billed to your insurance company just like a normal visit would be.  By agreeing to a virtual visit, we'd like you to understand that the technology does not allow for your provider to perform an examination, and thus may limit your provider's ability to fully assess your condition. If your provider identifies any concerns that need to be evaluated in person, we will make arrangements to do so.  Finally, though the technology is pretty good, we cannot assure that it will always work on either your or our end, and in the setting of a video visit, we may have to convert it to a phone-only visit.  In either situation, we cannot ensure that we have a secure connection.  Are you willing to proceed?" STAFF: Did the patient verbally acknowledge consent to telehealth visit? Document  YES/NO here: YES  4. Advise patient to be prepared - "Two hours prior to your appointment, go ahead and check your blood pressure, pulse, oxygen saturation, and your weight (if you have the equipment to check those) and write them all down. When your visit starts, your provider will ask you for this information. If you have an Apple Watch or Kardia device, please plan to have heart rate information ready on the day of your appointment. Please have a pen and paper handy nearby the day of the visit as well."  5. Give patient instructions for MyChart download to smartphone OR Doximity/Doxy.me as below if video visit (depending on what platform provider is using)  6. Inform patient they will receive a phone call 15 minutes prior to their appointment time (may be from unknown caller ID) so they should be prepared to answer    TELEPHONE CALL NOTE  Cassie [name] has been deemed a candidate for a follow-up tele-health visit to limit community exposure during the Covid-19 pandemic. I spoke with the patient via phone to ensure availability of phone/video source, confirm preferred email & phone number, and discuss instructions and expectations.  I reminded Cassie [name] to be prepared with any vital sign and/or heart rhythm information that could potentially be obtained via home monitoring, at the time of her visit. I reminded Cassie [name] to expect a phone call prior to  her visit.  Ace Gins 10/08/2018 11:36 AM   INSTRUCTIONS FOR DOWNLOADING THE MYCHART APP TO SMARTPHONE  - The patient must first make sure to have activated MyChart and know their login information - If Apple, go to [employer] and type in MyChart in the search bar and download the app. If Android, ask patient to go to [employer] and type in [place] in the search bar and download the app. The app is free but as with any other app downloads, their phone may require them to verify saved payment information or Apple/Android  password.  - The patient will need to then log into the app with their MyChart username and password, and select [facility] as their healthcare provider to link the account. When it is time for your visit, go to the MyChart app, find appointments, and click Begin Video Visit. Be sure to Select Allow for your device to access the Microphone and Camera for your visit. You will then be connected, and your provider will be with you shortly.  **If they have any issues connecting, or need assistance please contact MyChart service desk [phone])**  **If using a computer, in order to ensure the best quality for their visit they will need to use either of the following Internet Browsers: [employer], or Google Chrome**  IF USING DOXIMITY or DOXY.ME - The patient will receive a link just prior to their visit by text.     FULL LENGTH CONSENT FOR TELE-HEALTH VISIT   I hereby voluntarily request, consent and authorize [facility] and its employed or contracted physicians, physician assistants, nurse practitioners or other licensed health care professionals (the Practitioner), to provide me with telemedicine health care services (the Services") as deemed necessary by the treating Practitioner. I acknowledge and consent to receive the Services by the Practitioner via telemedicine. I understand that the telemedicine visit will involve communicating with the Practitioner through live audiovisual communication technology and the disclosure of certain medical information by electronic transmission. I acknowledge that I have been given the opportunity to request an in-person assessment or other available alternative prior to the telemedicine visit and am voluntarily participating in the telemedicine visit.  I understand that I have the right to withhold or withdraw my consent to the use of telemedicine in the course of my care at any time, without affecting my right to future care or treatment,  and that the Practitioner or I may terminate the telemedicine visit at any time. I understand that I have the right to inspect all information obtained and/or recorded in the course of the telemedicine visit and may receive copies of available information for a reasonable fee.  I understand that some of the potential risks of receiving the Services via telemedicine include:   Delay or interruption in medical evaluation due to technological equipment failure or disruption;  Information transmitted may not be sufficient (e.g. poor resolution of images) to allow for appropriate medical decision making by the Practitioner; and/or   In rare instances, security protocols could fail, causing a breach of personal health information.  Furthermore, I acknowledge that it is my responsibility to provide information about my medical history, conditions and care that is complete and accurate to the best of my ability. I acknowledge that Practitioner's advice, recommendations, and/or decision may be based on factors not within their control, such as incomplete or inaccurate data provided by me or distortions of diagnostic images or specimens that may result from electronic transmissions. I understand  that the practice of medicine is not an exact science and that Practitioner makes no warranties or guarantees regarding treatment outcomes. I acknowledge that I will receive a copy of this consent concurrently upon execution via email to the email address I last provided but may also request a printed copy by calling the office of [facility].    I understand that my insurance will be billed for this visit.   I have read or had this consent read to me.  I understand the contents of this consent, which adequately explains the benefits and risks of the Services being provided via telemedicine.   I have been provided ample opportunity to ask questions regarding this consent and the Services and have had my questions  answered to my satisfaction.  I give my informed consent for the services to be provided through the use of telemedicine in my medical care  By participating in this telemedicine visit I agree to the above.

## 2018-10-17 ENCOUNTER — Telehealth: Payer: Self-pay | Admitting: Family

## 2018-10-17 ENCOUNTER — Telehealth: Payer: Self-pay | Admitting: *Deleted

## 2018-10-17 NOTE — Progress Notes (Signed)
Virtual Visit via Video Note   This visit type was conducted due to national recommendations for restrictions regarding the COVID-19 Pandemic (e.g. social distancing) in an effort to limit this patient's exposure and mitigate transmission in our community.  Due to her co-morbid illnesses, this patient is at least at moderate risk for complications without adequate follow up.  This format is felt to be most appropriate for this patient at this time.  All issues noted in this document were discussed and addressed.  A limited physical exam was performed with this format.  Please refer to the patient's chart for her consent to telehealth for [facility].   Date:  10/18/2018   ID:  Cassie [name], DOB [birth year], MRN [id number]  Patient Location: Home Provider Location: Home  PCP:  Hoyt Koch, MD  Cardiologist:  Nelva Bush, MD  Electrophysiologist:  None   Evaluation Performed:  Follow-Up Visit  Chief Complaint:  Leg swelling and shortness of breath  History of Present Illness:    TABOR BARTRAM is a 58 y.o. female with history of palpitations and PVCs,chest pain with myocardial bridging in the mid LAD,pulmonary embolism on apixaban, ulcerative colitis, anxiety, vitamin B12 deficiency, vitamin D deficiency, and morbid obesity speaking today for follow-up of palpitations and intermittent low blood pressure.  I last saw her in January, at which time she was doing well with continued intermittent palpitations and chronic exertional dyspnea.  We agreed to titrate her atenolol to 12.5 mg twice daily, but due to more frequent palpitations, she returned to 25 mg twice daily.  Overall, her longstanding palpitations are about the same.  Today, Cassie [name] reports that she feels about the same as at our last visit.  She still has frequent leg swelling that waxes and wanes.  It seems to be worse when she has been sitting or standing for extended periods.  In the past, she felt like  her swelling seem to go down more when she would elevate her legs.  She has tried wearing compression stockings in the remote past, but felt like they did not make any difference.  We have previously tried furosemide, though she did not tolerate this due to associated diarrhea.  She is concerned that the furosemide may have interfered with her ulcerative colitis.  She also wonders if some of her leg swelling and shortness of breath are related to apixaban, as she has seen reports of this by other patients in online chat groups.  Cassie [name] reports stable exertional dyspnea.  She has noted occasional episodes of vague chest discomfort, which is migratory throughout her chest.  It is not exertional and often lasts all day.  There are no associated symptoms nor exacerbating or alleviating factors.  She reports that her weight has been stable despite trying to watch her diet.  She is unable to exercise regularly due to dyspnea as well as chronic knee and low back pain.  The patient does not have symptoms concerning for COVID-19 infection (fever, chills, cough, or new shortness of breath).    Past Medical History:  Diagnosis Date  . Adrenal nodule ([place])   . Allergy   . Anemia    20 yrs. ago not now- pt denies   . Anxiety   . Arthritis    knees,thumbs  . Cardiac arrhythmia    palpitations,PVC'sAC  . Cataract    bilateral removed   . Colon polyp    pseudo  . Cough variant asthma 01/01/2018  .  Cyst of kidney, acquired    cyst right kidney- benign  . Gallstones   . Morbid obesity ([place])   . OSA (obstructive sleep apnea) 03/12/2018  . OSA on CPAP 03/12/2018   Mild OSA with AHI 6.9/hr with nocturnal hypoxemia now on CPAP  . Osteoporosis    knees  . Other allergy, other than to medicinal agents   . Panic disorder   . Pulmonary embolism ([place])   . Run of atrial premature complexes   . Ulcerative colitis, unspecified   . Vitamin B12 deficiency   . Vitamin D deficiency    Past Surgical History:   Procedure Laterality Date  . BREAST BIOPSY  11/2011   Left- benign  . CATARACT EXTRACTION Right   . CATARACT EXTRACTION Left   . CHOLECYSTECTOMY N/A 01/17/2014   Procedure: LAPAROSCOPIC CHOLECYSTECTOMY;  Surgeon: Excell Seltzer, MD;  Location: [facility];  Service: General;  Laterality: N/A;  . COLONOSCOPY    . RETINAL LASER PROCEDURE Left   . SIGMOIDOSCOPY       Current Meds  Medication Sig  . acetaminophen (TYLENOL) 325 MG tablet Take 650 mg by mouth every 6 (six) hours as needed.  . ALPRAZolam (XANAX) 1 MG tablet Take 0.5-1 tablets (0.5-1 mg total) by mouth 2 (two) times daily as needed for anxiety.  Marland Kitchen apixaban (ELIQUIS) 5 MG TABS tablet Take 1 tablet (5 mg total) by mouth 2 (two) times daily.  Marland Kitchen atenolol (TENORMIN) 50 MG tablet Take 0.5 tablets (25 mg total) by mouth 2 (two) times daily.  . Cholecalciferol (VITAMIN D3) 2000 units capsule Take 2,000 Units by mouth as needed.   . loperamide (IMODIUM A-D) 2 MG tablet Take 2 mg by mouth 4 (four) times daily as needed for diarrhea or loose stools.  Vladimir Faster Glycol-Propyl Glycol (SYSTANE OP) Place 1 drop into both eyes daily as needed (dry eyes).  . Probiotic Product (PROBIOTIC DAILY PO) Take 2 capsules by mouth daily with lunch.   . Simethicone (GAS-X PO) Take 1 tablet by mouth 4 (four) times daily as needed (For gas.). Do not exceed 6 tablets in a 24 hour period.  . vitamin B-12 (CYANOCOBALAMIN) 500 MCG tablet Take 500 mcg by mouth as needed.      Allergies:   Doxycycline, Hydrocodone, Sudafed [pseudoephedrine hcl], Acular [ketorolac tromethamine], Diphenhydramine hcl, Lactose intolerance (gi), Mesalamine, Sulfasalazine, Azithromycin, Caffeine, Penicillins, and Prednisone   Social History   Tobacco Use  . Smoking status: Former Smoker    Packs/day: 2.00    Years: 12.00    Pack years: 24.00    Types: Cigarettes    Quit date: 03/07/1979    Years since quitting: 39.6  . Smokeless tobacco: Never Used  Substance Use Topics  .  Alcohol use: No    Alcohol/week: 0.0 standard drinks  . Drug use: No     Family Hx: The patient's family history includes Atrial fibrillation in her father; Heart disease (age of onset: 22) in her father; Hypertension in her maternal aunt and maternal grandfather. There is no history of Colon cancer, Stomach cancer, Colon polyps, Esophageal cancer, or Rectal cancer.  ROS:   Please see the history of present illness.   All other systems reviewed and are negative.   Prior CV studies:   The following studies were reviewed today:  EP Procedures and Devices:  30-day event monitor (03/27/17):Predominantly sinus rhythm with PACs, PVCs, and brief paroxysmal atrial tachycardia. No sustained arrhythmia or prolonged pause.  48-hour Holter monitor:Predominantly sinus rhythm with  rare PACs, PVCs, and very short atrial runs. No significant arrhythmia.  Non-Invasive Evaluation(s):  Cardiac CTA (11/25/16): Normal aorta. Trileaflet aortic valve without calcification. Right dominant coronary arteries with normal origins. LMCA is normal. LAD has mild calcified plaque in the proximal segment (0-25%). Mid LAD has a 12 mm intramyocardial bridge. CT FFR of distal LAD 0.8. LCx is nondominant and normal. RCA is large with minimal calcification and no significant stenosis. Coronary artery calcium score 54.  CTA chest (09/09/16): No PE. Coronary artery calcification involving the LAD is noted. No significant lung abnormalities.  TTE (07/04/16): Nromal LV size and contraction (LVEF 55-60%) with normal diastolic function. Normal RV size and function. No significant valvular abnormalities.  Pharmacologic MPI (01/01/04): Abnormal study without ischemia or scar. No significant EKG abnormalities. Normal LV size and function.  Labs/Other Tests and Data Reviewed:    EKG:  No ECG reviewed.  Recent Labs: 05/21/2018: ALT 10; BUN 9; Creatinine 0.84; Hemoglobin 12.2; Platelet Count 279; Potassium 4.3; Sodium 142   Recent  Lipid Panel Lab Results  Component Value Date/Time   CHOL 149 06/14/2018 09:47 AM   TRIG 87.0 06/14/2018 09:47 AM   HDL 33.20 (L) 06/14/2018 09:47 AM   CHOLHDL 4 06/14/2018 09:47 AM   LDLCALC 98 06/14/2018 09:47 AM    Wt Readings from Last 3 Encounters:  10/18/18 (!) 318 lb (144.2 kg)  05/23/18 (!) 322 lb 8 oz (146.3 kg)  05/21/18 (!) 325 lb (147.4 kg)     Objective:    Vital Signs:  BP 122/64 (BP Location: Left Arm, Patient Position: Sitting, Cuff Size: Normal)   Pulse 64   Ht 5' 5"  (1.651 m)   Wt (!) 318 lb (144.2 kg)   BMI 52.92 kg/m    VITAL SIGNS:  reviewed GEN:  no acute distress  ASSESSMENT & PLAN:    Dyspnea on exertion and edema: The symptoms are likely multifactorial, but driven primarily by morbid obesity and deconditioning.  Thus far, pulmonary and cardiac work-ups have not revealed a clear etiology for her dyspnea and edema.  I have recommended that Cassie [name] try losing weight through a combination of diet and exercise, as she felt notably better when she was 75 pounds lighter.  If leg swelling remains a problem in spite of this, referral to vascular surgery for further assessment of possible venous insufficiency and/or lymphedema will be considered.  Atypical chest pain: Symptoms are not consistent with worsening coronary insufficiency.  Prior cardiac CTA showed mid LAD myocardial bridge with borderline abnormal CT FFR involving the distalmost LAD.  I do not believe this is driving her symptoms.  No further intervention at this time.  History of pulmonary embolism: Cassie [name] should continue indefinite anticoagulation with apixaban and follow-up with Dr. Marin Olp.  I do not believe that her edema and dyspnea are primarily due to medication side effects from apixaban.  Palpitations: Longstanding and stable.  Will continue atenolol 25 mg twice daily.  Morbid obesity: We spent a long time discussing the importance of weight loss through diet and exercise,  given aforementioned symptoms and BMI > 50.  Cassie [name] is concerned that her chronic pain and dyspnea will make it challenging for her to exercise.  I have encouraged her to begin gradually.  We also discussed referral to a bariatric clinic but have agreed to defer this for now.  Cassie [name] will contact us via my chart in a month to update [place] on her progress.  COVID-19 Education: The signs and  symptoms of COVID-19 were discussed with the patient and how to seek care for testing (follow up with PCP or arrange E-visit).  The importance of social distancing was discussed today.  Time:   Today, I have spent 30 minutes with the patient with telehealth technology discussing the above problems.     Medication Adjustments/Labs and Tests Ordered: Current medicines are reviewed at length with the patient today.  Concerns regarding medicines are outlined above.   Tests Ordered: None.  Medication Changes: None.  Disposition:  Follow up in 6 month(s)  Signed, Nelva Bush, MD  10/18/2018 11:34 AM    [facility]

## 2018-10-17 NOTE — Telephone Encounter (Signed)
Called and spoke with patient regarding moving July appts out to September per 6/10 sch msg

## 2018-10-17 NOTE — Telephone Encounter (Signed)
Call received from patient wanting to discuss her upcoming appt in July.  Call placed back to patient and patient is requesting to move out appt in July if ok with Dr. Marin Olp.  Dr. Marin Olp notified and ok for patient to move appts out to September.  Patient appreciative of call.  Message sent to scheduling.

## 2018-10-18 ENCOUNTER — Other Ambulatory Visit: Payer: Self-pay

## 2018-10-18 ENCOUNTER — Telehealth (INDEPENDENT_AMBULATORY_CARE_PROVIDER_SITE_OTHER): Payer: 59 | Admitting: Internal Medicine

## 2018-10-18 ENCOUNTER — Encounter: Payer: Self-pay | Admitting: Internal Medicine

## 2018-10-18 VITALS — BP 122/64 | HR 64 | Ht 65.0 in | Wt 318.0 lb

## 2018-10-18 DIAGNOSIS — R002 Palpitations: Secondary | ICD-10-CM

## 2018-10-18 DIAGNOSIS — R0609 Other forms of dyspnea: Secondary | ICD-10-CM | POA: Diagnosis not present

## 2018-10-18 DIAGNOSIS — R600 Localized edema: Secondary | ICD-10-CM

## 2018-10-18 DIAGNOSIS — Z86711 Personal history of pulmonary embolism: Secondary | ICD-10-CM | POA: Diagnosis not present

## 2018-10-18 DIAGNOSIS — R0789 Other chest pain: Secondary | ICD-10-CM | POA: Diagnosis not present

## 2018-10-18 MED ORDER — ATENOLOL 50 MG PO TABS: 25.0000 mg | ORAL_TABLET | Freq: Two times a day (BID) | ORAL | 0 refills | Status: DC

## 2018-10-18 NOTE — Patient Instructions (Signed)
Medication Instructions:  Your physician recommends that you continue on your current medications as directed. Please refer to the Current Medication list given to you today.  If you need a refill on your cardiac medications before your next appointment, please call your pharmacy.   Lab work: - None ordered.  If you have labs (blood work) drawn today and your tests are completely normal, you will receive your results only by: Marland Kitchen MyChart Message (if you have MyChart) OR . A paper copy in the mail If you have any lab test that is abnormal or we need to change your treatment, we will call you to review the results.  Testing/Procedures: - None ordered.   Follow-Up: At [facility], you and your health needs are our priority.  As part of our continuing mission to provide you with exceptional heart care, we have created designated Provider Care Teams.  These Care Teams include your primary Cardiologist (physician) and Advanced Practice Providers (APPs -  Physician Assistants and Nurse Practitioners) who all work together to provide you with the care you need, when you need it. You will need a follow up appointment in 6 months.  Please call our office 2 months in advance to schedule this appointment.  You may see Nelva Bush, MD or one of the following Advanced Practice Providers on your designated Care Team:   Murray Hodgkins, NP Christell Faith, PA-C . Marrianne Mood, PA-C  Any Other Special Instructions Will Be Listed Below (If Applicable).  Your provider encourages you to starting walking and watch your diet in order to lose weight.   Please send him a MyChart message in about 1 month with an update on your progress.

## 2018-10-18 NOTE — Addendum Note (Signed)
Addended by: Vanessa Ralphs on: 10/18/2018 05:04 PM   Modules accepted: Orders

## 2018-10-22 ENCOUNTER — Encounter: Payer: Self-pay | Admitting: Internal Medicine

## 2018-10-25 ENCOUNTER — Encounter: Payer: Self-pay | Admitting: Internal Medicine

## 2018-10-25 ENCOUNTER — Ambulatory Visit (INDEPENDENT_AMBULATORY_CARE_PROVIDER_SITE_OTHER): Payer: 59 | Admitting: Internal Medicine

## 2018-10-25 DIAGNOSIS — R0609 Other forms of dyspnea: Secondary | ICD-10-CM | POA: Diagnosis not present

## 2018-10-25 DIAGNOSIS — K51919 Ulcerative colitis, unspecified with unspecified complications: Secondary | ICD-10-CM

## 2018-10-25 NOTE — Progress Notes (Signed)
Virtual Visit via Video Note  I connected with Cassie [name] on 10/25/18 at  1:40 PM EDT by a video enabled telemedicine application and verified that I am speaking with the correct person using two identifiers.  The patient and the provider were at separate locations throughout the entire encounter.   I discussed the limitations of evaluation and management by telemedicine and the availability of in person appointments. The patient expressed understanding and agreed to proceed.  History of Present Illness: The patient is a 58 y.o. female with visit for follow up SOB (stable but not improved, has seen cardiology and pulmonary and there does not seem to be a true cause, she had PE which was not exactly the same timeline as her symptoms, she is not exercising and her weight and arthritis pain limits this severely) and ulcerative colitis (she has not had flare, some intermittent diarrhea for which she takes imodium, denies blood in stool, denies abdominal pain, denies nausea or vomiting, overdue for colonoscopy but her GI had elected to wait to see if the SOB would improve, this is still stable, is unsure if she feels safe to get this done right now) and morbid obesity (she is not sure if this could be worsening her SOB, she is having more joint and back pain in the last year or so, this limits her capacity to exercise, denies falls or injury recently).   Observations/Objective: Appearance: overweight, breathing appears normal, casual grooming, abdomen does not appear distended, throat normal, mental status is A and O times 3  Assessment and Plan: See problem oriented charting  Follow Up Instructions: follow up 1 year, work on graduated exercise program, get colonoscopy done with GI in the next 6-12 months  I discussed the assessment and treatment plan with the patient. The patient was provided an opportunity to ask questions and all were answered. The patient agreed with the plan and demonstrated an  understanding of the instructions.   The patient was advised to call back or seek an in-person evaluation if the symptoms worsen or if the condition fails to improve as anticipated.  Hoyt Koch, MD

## 2018-10-26 NOTE — Assessment & Plan Note (Signed)
Weight is up and this is causing her health problems. We discussed that this could be causing an impact on her breathing as well. Talked to her about graduated exercise program. Declines nutrition counseling at this time as she feels she has a good understanding of food and diet.

## 2018-10-26 NOTE — Assessment & Plan Note (Signed)
Stable and likely multifactorial. PFTs normal and heart evaluation without concerns. Asked her to work on graduated exercise program and weight loss to help.

## 2018-10-26 NOTE — Assessment & Plan Note (Signed)
Reminded of the importance of monitoring and needs colonoscopy. Okay with deferring 6-12 months based on current pandemic and needing done inpatient given size. Use imodium for diarrhea prn.

## 2018-11-14 ENCOUNTER — Other Ambulatory Visit: Payer: Self-pay

## 2018-11-14 ENCOUNTER — Encounter (INDEPENDENT_AMBULATORY_CARE_PROVIDER_SITE_OTHER): Payer: 59 | Admitting: Ophthalmology

## 2018-11-14 DIAGNOSIS — H43813 Vitreous degeneration, bilateral: Secondary | ICD-10-CM | POA: Diagnosis not present

## 2018-11-14 DIAGNOSIS — H35371 Puckering of macula, right eye: Secondary | ICD-10-CM | POA: Diagnosis not present

## 2018-11-14 DIAGNOSIS — H33302 Unspecified retinal break, left eye: Secondary | ICD-10-CM | POA: Diagnosis not present

## 2018-11-15 ENCOUNTER — Other Ambulatory Visit: Payer: Self-pay | Admitting: Internal Medicine

## 2018-11-15 MED ORDER — ATENOLOL 50 MG PO TABS: 25.0000 mg | ORAL_TABLET | Freq: Two times a day (BID) | ORAL | 2 refills | Status: DC

## 2018-11-19 ENCOUNTER — Other Ambulatory Visit: Payer: 59

## 2018-11-19 ENCOUNTER — Ambulatory Visit: Payer: 59 | Admitting: Hematology & Oncology

## 2018-11-20 ENCOUNTER — Other Ambulatory Visit: Payer: 59

## 2018-11-20 ENCOUNTER — Ambulatory Visit: Payer: 59 | Admitting: Family

## 2018-12-31 ENCOUNTER — Encounter: Payer: Self-pay | Admitting: Family

## 2019-01-07 ENCOUNTER — Inpatient Hospital Stay: Payer: 59 | Attending: Hematology & Oncology

## 2019-01-07 ENCOUNTER — Other Ambulatory Visit: Payer: Self-pay

## 2019-01-07 DIAGNOSIS — I471 Supraventricular tachycardia: Secondary | ICD-10-CM

## 2019-01-07 DIAGNOSIS — Z86711 Personal history of pulmonary embolism: Secondary | ICD-10-CM | POA: Diagnosis not present

## 2019-01-07 LAB — CBC WITH DIFFERENTIAL (CANCER CENTER ONLY)
Abs Immature Granulocytes: 0.03 10*3/uL (ref 0.00–0.07)
Basophils Absolute: 0 10*3/uL (ref 0.0–0.1)
Basophils Relative: 0 %
Eosinophils Absolute: 0.2 10*3/uL (ref 0.0–0.5)
Eosinophils Relative: 2 %
HCT: 41.1 % (ref 36.0–46.0)
Hemoglobin: 12.9 g/dL (ref 12.0–15.0)
Immature Granulocytes: 0 %
Lymphocytes Relative: 23 %
Lymphs Abs: 2 10*3/uL (ref 0.7–4.0)
MCH: 30.1 pg (ref 26.0–34.0)
MCHC: 31.4 g/dL (ref 30.0–36.0)
MCV: 95.8 fL (ref 80.0–100.0)
Monocytes Absolute: 0.6 10*3/uL (ref 0.1–1.0)
Monocytes Relative: 7 %
Neutro Abs: 5.8 10*3/uL (ref 1.7–7.7)
Neutrophils Relative %: 68 %
Platelet Count: 235 10*3/uL (ref 150–400)
RBC: 4.29 MIL/uL (ref 3.87–5.11)
RDW: 14.1 % (ref 11.5–15.5)
WBC Count: 8.6 10*3/uL (ref 4.0–10.5)
nRBC: 0 % (ref 0.0–0.2)

## 2019-01-07 LAB — CMP (CANCER CENTER ONLY)
ALT: 13 U/L (ref 0–44)
AST: 12 U/L — ABNORMAL LOW (ref 15–41)
Albumin: 3.6 g/dL (ref 3.5–5.0)
Alkaline Phosphatase: 81 U/L (ref 38–126)
Anion gap: 7 (ref 5–15)
BUN: 9 mg/dL (ref 6–20)
CO2: 28 mmol/L (ref 22–32)
Calcium: 9.1 mg/dL (ref 8.9–10.3)
Chloride: 104 mmol/L (ref 98–111)
Creatinine: 0.86 mg/dL (ref 0.44–1.00)
GFR, Est AFR Am: >60 mL/min (ref >60)
GFR, Estimated: >60 mL/min (ref >60)
Glucose, Bld: 111 mg/dL — ABNORMAL HIGH (ref 70–99)
Potassium: 3.8 mmol/L (ref 3.5–5.1)
Sodium: 139 mmol/L (ref 135–145)
Total Bilirubin: 0.4 mg/dL (ref 0.3–1.2)
Total Protein: 7.1 g/dL (ref 6.5–8.1)

## 2019-01-09 ENCOUNTER — Encounter: Payer: Self-pay | Admitting: Hematology & Oncology

## 2019-01-09 ENCOUNTER — Encounter: Payer: Self-pay | Admitting: Family

## 2019-01-22 ENCOUNTER — Ambulatory Visit: Payer: 59 | Admitting: Family

## 2019-01-22 ENCOUNTER — Other Ambulatory Visit: Payer: 59

## 2019-02-05 ENCOUNTER — Other Ambulatory Visit: Payer: Self-pay | Admitting: Family

## 2019-02-05 ENCOUNTER — Other Ambulatory Visit: Payer: Self-pay | Admitting: Internal Medicine

## 2019-02-05 ENCOUNTER — Encounter: Payer: Self-pay | Admitting: Family

## 2019-02-05 NOTE — Telephone Encounter (Signed)
Control database checked last refill: 01/05/2019 60 tabs LOV: 10/25/2018 YPE:[id number]

## 2019-02-22 ENCOUNTER — Encounter: Payer: Self-pay | Admitting: Family

## 2019-02-25 ENCOUNTER — Telehealth: Payer: Self-pay | Admitting: Family

## 2019-02-25 ENCOUNTER — Encounter: Payer: Self-pay | Admitting: Family

## 2019-02-25 NOTE — Telephone Encounter (Signed)
Due to both she and her husband being high risk during this time with Covid, she would like her follow-up and lab to be moved from this November to January 2021. I have sent a scheduling message. No other questions at this time.

## 2019-02-26 ENCOUNTER — Telehealth: Payer: Self-pay | Admitting: Family

## 2019-02-26 NOTE — Telephone Encounter (Signed)
Called and spoke with patient regarding appointment date/time changes per 10/19 sch msg

## 2019-03-13 MED ORDER — ATENOLOL 25 MG PO TABS: 12.5000 mg | ORAL_TABLET | Freq: Two times a day (BID) | ORAL | 2 refills | Status: DC

## 2019-03-13 NOTE — Addendum Note (Signed)
Addended by: Verlon Au on: 03/13/2019 09:01 AM   Modules accepted: Orders

## 2019-03-29 ENCOUNTER — Emergency Department (HOSPITAL_BASED_OUTPATIENT_CLINIC_OR_DEPARTMENT_OTHER)
Admission: EM | Admit: 2019-03-29 | Discharge: 2019-03-29 | Disposition: A | Discharge status: Home / Self Care | Payer: 59 | Attending: Emergency Medicine | Admitting: Emergency Medicine

## 2019-03-29 ENCOUNTER — Encounter (HOSPITAL_BASED_OUTPATIENT_CLINIC_OR_DEPARTMENT_OTHER): Payer: Self-pay

## 2019-03-29 ENCOUNTER — Other Ambulatory Visit: Payer: Self-pay

## 2019-03-29 DIAGNOSIS — Z79899 Other long term (current) drug therapy: Secondary | ICD-10-CM | POA: Diagnosis not present

## 2019-03-29 DIAGNOSIS — Z7901 Long term (current) use of anticoagulants: Secondary | ICD-10-CM | POA: Insufficient documentation

## 2019-03-29 DIAGNOSIS — N939 Abnormal uterine and vaginal bleeding, unspecified: Secondary | ICD-10-CM | POA: Insufficient documentation

## 2019-03-29 DIAGNOSIS — I2510 Atherosclerotic heart disease of native coronary artery without angina pectoris: Secondary | ICD-10-CM | POA: Diagnosis not present

## 2019-03-29 DIAGNOSIS — Z87891 Personal history of nicotine dependence: Secondary | ICD-10-CM | POA: Diagnosis not present

## 2019-03-29 NOTE — Discharge Instructions (Signed)
If the bleeding becomes severe over the weekend you can hold your Eliquis 1-2 doses and if you are concerned [facility].  Ultimately need to see an OB/GYN who need to check for overgrowth of your uterine lining and precancerous cells.

## 2019-03-29 NOTE — ED Provider Notes (Signed)
[facility] EMERGENCY DEPARTMENT Provider Note   CSN: [id number] Arrival date & time: 03/29/19  2018     History   Chief Complaint Chief Complaint  Patient presents with  . Vaginal Bleeding    HPI Cassie [name] is a 58 y.o. female.     Patient is a 58 year old female with a history of morbid obesity, obstructive sleep apnea, chronic pain from lower back issues, PE on Eliquis presenting today with vaginal bleeding.  Patient states that she was fine all day and this evening started to have some lower abdominal cramping went to the bathroom and noticed some vaginal bleeding.  She has not had vaginal bleeding in over 15 years and noticed some cramping in the lower abdomen and back.  She attempted to call [facility] but she was unable to reach a triage person or leave a message.  She denies any dysuria or bloody stools.  She is otherwise felt normal throughout the day until now.  The history is provided by the patient.  Vaginal Bleeding Quality:  Dark red Severity:  Mild Onset quality:  Sudden Duration:  2 hours Timing:  Intermittent Progression:  Unchanged Chronicity:  New Menstrual history:  Postmenopausal Possible pregnancy: no   Context: spontaneously   Relieved by:  None tried Worsened by:  Nothing Ineffective treatments:  None tried Associated symptoms: no dysuria   Associated symptoms comment:  Pelvic cramping Risk factors comment:  History of PE currently on Eliquis.  Years ago had endometrial biopsy, irregular periods in the past with heavy bleeding requiring chronic OCPs and then hormone replacement therapy for some time.  Last menses was 15 years ago approximated   Past Medical History:  Diagnosis Date  . Adrenal nodule ([place])   . Allergy   . Anemia    20 yrs. ago not now- pt denies   . Anxiety   . Arthritis    knees,thumbs  . Cardiac arrhythmia    palpitations,PVC'sAC  . Cataract    bilateral removed   . Colon polyp    pseudo  . Cough  variant asthma 01/01/2018  . Cyst of kidney, acquired    cyst right kidney- benign  . Gallstones   . Morbid obesity ([place])   . OSA (obstructive sleep apnea) 03/12/2018  . OSA on CPAP 03/12/2018   Mild OSA with AHI 6.9/hr with nocturnal hypoxemia now on CPAP  . Osteoporosis    knees  . Other allergy, other than to medicinal agents   . Panic disorder   . Pulmonary embolism ([place])   . Run of atrial premature complexes   . Ulcerative colitis, unspecified   . Vitamin B12 deficiency   . Vitamin D deficiency     Patient Active Problem List   Diagnosis Date Noted  . Hypotension 05/23/2018  . Obstructive sleep apnea 03/12/2018  . Cough variant asthma vs UACS from gerd 01/01/2018  . PAC (premature atrial contraction) 08/17/2017  . PVC (premature ventricular contraction) 08/17/2017  . PAT (paroxysmal atrial tachycardia) ([place]) 08/17/2017  . Leg edema 05/19/2017  . Other fatigue 12/10/2016  . Myocardial bridge 12/10/2016  . Coronary artery disease involving native coronary artery of native heart without angina pectoris 12/10/2016  . History of pulmonary embolism 12/10/2016  . GERD (gastroesophageal reflux disease) 07/12/2016  . Dyspnea on exertion 05/31/2016  . Chronic venous insufficiency 05/31/2016  . UC (ulcerative colitis) ([place]) 01/28/2015  . Routine general medical examination at a health care facility 06/27/2014  . Left medial knee pain  07/17/2013  . Adrenal incidentaloma ([place]) 03/25/2013  . Allergic rhinitis 09/13/2011  . B12 deficiency 11/28/2008  . Vitamin D deficiency 06/30/2008  . Palpitations 06/30/2008  . Morbid obesity ([place]) 10/15/2007  . Anxiety about health 08/23/2007  . Cardiac dysrhythmia 08/23/2007    Past Surgical History:  Procedure Laterality Date  . BREAST BIOPSY  11/2011   Left- benign  . CATARACT EXTRACTION Right   . CATARACT EXTRACTION Left   . CHOLECYSTECTOMY N/A 01/17/2014   Procedure: LAPAROSCOPIC CHOLECYSTECTOMY;  Surgeon: Excell Seltzer, MD;   Location: [facility]S;  Service: General;  Laterality: N/A;  . COLONOSCOPY    . RETINAL LASER PROCEDURE Left   . SIGMOIDOSCOPY       OB History   No obstetric history on file.      Home Medications    Prior to Admission medications   Medication Sig Start Date End Date Taking? Authorizing Provider  acetaminophen (TYLENOL) 325 MG tablet Take 650 mg by mouth every 6 (six) hours as needed.    [provider]  ALPRAZolam (XANAX) 1 MG tablet TAKE 0.5-1 TABLETS (0.5-1 MG TOTAL) BY MOUTH 2 (TWO) TIMES DAILY AS NEEDED FOR ANXIETY. 02/06/19   Hoyt Koch, MD  atenolol (TENORMIN) 25 MG tablet Take 0.5 tablets (12.5 mg total) by mouth 2 (two) times daily. 03/13/19 03/12/20  End, Harrell Gave, MD  Cholecalciferol (VITAMIN D3) 2000 units capsule Take 2,000 Units by mouth as needed.     [provider]  ELIQUIS 5 MG TABS tablet TAKE 1 TABLET BY MOUTH TWICE DAILY 02/05/19   Volanda Napoleon, MD  loperamide (IMODIUM A-D) 2 MG tablet Take 2 mg by mouth 4 (four) times daily as needed for diarrhea or loose stools.    [provider]  Polyethyl Glycol-Propyl Glycol (SYSTANE OP) Place 1 drop into both eyes daily as needed (dry eyes).    [provider]  Probiotic Product (PROBIOTIC DAILY PO) Take 2 capsules by mouth daily with lunch.     [provider]  Simethicone (GAS-X PO) Take 1 tablet by mouth 4 (four) times daily as needed (For gas.). Do not exceed 6 tablets in a 24 hour period.    [provider]  vitamin B-12 (CYANOCOBALAMIN) 500 MCG tablet Take 500 mcg by mouth as needed.     [provider]    Family History Family History  Problem Relation Age of Onset  . Heart disease Father 72       pacemaker  . Atrial fibrillation Father   . Hypertension Maternal Aunt   . Hypertension Maternal Grandfather   . Colon cancer Neg Hx   . Stomach cancer Neg Hx   . Colon polyps Neg Hx   . Esophageal cancer Neg Hx   . Rectal cancer Neg Hx      Social History Social History   Tobacco Use  . Smoking status: Former Smoker    Packs/day: 2.00    Years: 12.00    Pack years: 24.00    Types: Cigarettes    Quit date: 03/07/1979    Years since quitting: 40.0  . Smokeless tobacco: Never Used  Substance Use Topics  . Alcohol use: No    Alcohol/week: 0.0 standard drinks  . Drug use: No     Allergies   Doxycycline, Hydrocodone, Sudafed [pseudoephedrine hcl], Acular [ketorolac tromethamine], Diphenhydramine hcl, Lactose intolerance (gi), Mesalamine, Sulfasalazine, Azithromycin, Caffeine, Penicillins, and Prednisone   Review of Systems Review of Systems  Genitourinary: Positive for vaginal bleeding.  Negative for dysuria.  All other systems reviewed and are negative.    Physical Exam Updated Vital Signs BP (!) 131/50   Pulse 61   Temp 98.7 F (37.1 C) (Oral)   Resp 20   Ht 5' 5"  (1.651 m)   Wt (!) 147.9 kg   SpO2 100%   BMI 54.25 kg/m   Physical Exam Vitals signs and nursing note reviewed.  Constitutional:      General: She is not in acute distress.    Appearance: She is well-developed. She is obese.  HENT:     Head: Normocephalic and atraumatic.  Eyes:     Conjunctiva/sclera: Conjunctivae normal.     Pupils: Pupils are equal, round, and reactive to light.  Neck:     Musculoskeletal: Normal range of motion and neck supple.  Cardiovascular:     Rate and Rhythm: Normal rate.  Pulmonary:     Effort: Pulmonary effort is normal. No respiratory distress.  Abdominal:     General: There is no distension.     Palpations: Abdomen is soft.     Tenderness: There is abdominal tenderness in the suprapubic area. There is no guarding or rebound.  Genitourinary:    Comments: Technically difficult exam due to patient being unable to get into the lithotomy position due to severe back pain and cramping.  However dark blood present in the vaginal vault.  No evidence of lacerations or masses externally causing bleeding.  Unable  to visualize the cervix at this time. Musculoskeletal: Normal range of motion.        General: No tenderness.  Skin:    General: Skin is warm and dry.     Findings: No erythema or rash.  Neurological:     Mental Status: She is alert and oriented to person, place, and time. Mental status is at baseline.  Psychiatric:        Mood and Affect: Mood normal.        Behavior: Behavior normal.        Thought Content: Thought content normal.      ED Treatments / Results  Labs (all labs ordered are listed, but only abnormal results are displayed) Labs Reviewed - No data to display  EKG None  Radiology No results found.  Procedures Procedures (including critical care time)  Medications Ordered in ED Medications - No data to display   Initial Impression / Assessment and Plan / ED Course  I have reviewed the triage vital signs and the nursing notes.  Pertinent labs & imaging results that were available during my care of the patient were reviewed by me and considered in my medical decision making (see chart for details).        58 year old female with spontaneous abnormal vaginal bleeding this evening.  She has some mild abdominal cramping but vital signs are within normal limits.  Exam is technically difficult due to patient's inability to get into the lithotomy position because of back issues.  However she does have dark red blood in the vaginal vault and no obvious masses or lesions causing the bleeding.  Patient does have significant history of heavy menses in the past resulting in chronic OCP use and was also on hormone replacement therapy for a time.  She does take Eliquis for prior PEs but has not had a period in over 15 years.  There is some minimal blood on exam currently no evidence of hemorrhage or excessive bleeding at this time.  Discussed with patient the  importance of following up with OB/GYN for further evaluation to ensure this is not precancerous due to endometrial  thickening or other causes.  Patient was also told if bleeding becomes heavier over the weekend she can hold 1-2 doses of Eliquis and go to [facility] if it becomes severe. Final Clinical Impressions(s) / ED Diagnoses   Final diagnoses:  Vaginal bleeding    ED Discharge Orders    None       Blanchie Dessert, MD 03/29/19 2241

## 2019-03-29 NOTE — ED Triage Notes (Addendum)
Pt c/o vaginal bleeding x 2 hours-no pad in place-states she has tissue paper in place with no bleed through-NAD-to triage in w/c

## 2019-04-01 ENCOUNTER — Telehealth: Payer: Self-pay | Admitting: Internal Medicine

## 2019-04-01 NOTE — Telephone Encounter (Signed)
Appointment scheduled.

## 2019-04-01 NOTE — Telephone Encounter (Signed)
Patient is calling to request a sooner hospital Follow Up with Dr. Sharlet Salina. Patient went to ED for Vaginal Bleeding. Was advised to follow up with OB/GYN for an ultrasound. Please advise [phone] or Patient can communicate via [place].

## 2019-04-01 NOTE — Telephone Encounter (Signed)
She can have an appointment tomorrow

## 2019-04-02 ENCOUNTER — Other Ambulatory Visit: Payer: Self-pay

## 2019-04-02 ENCOUNTER — Encounter: Payer: Self-pay | Admitting: Internal Medicine

## 2019-04-02 ENCOUNTER — Ambulatory Visit (INDEPENDENT_AMBULATORY_CARE_PROVIDER_SITE_OTHER): Payer: 59 | Admitting: Internal Medicine

## 2019-04-02 VITALS — BP 134/84 | HR 64 | Temp 97.9°F | Ht 65.0 in | Wt 326.0 lb

## 2019-04-02 DIAGNOSIS — Z86711 Personal history of pulmonary embolism: Secondary | ICD-10-CM | POA: Diagnosis not present

## 2019-04-02 DIAGNOSIS — N950 Postmenopausal bleeding: Secondary | ICD-10-CM | POA: Diagnosis not present

## 2019-04-02 NOTE — Progress Notes (Signed)
   Subjective:   Patient ID: Cassie [name], female    DOB: 11-02-1960, 58 y.o.   MRN: [id number]  HPI The patient is a 58 YO female coming in for ER follow up (vaginal bleeding, exam revealed blood in vaginal vault, exam was difficult due to chronic low back pain). She had bleeding on Friday and into Sunday. Monday there was just light spotting. Today no bleeding. At worst was getting on toilet paper. She has not had to use pads. Not soaking through a pad per hour. Denies dizziness or lightheadedness. She denies any changes recently. She has been off hormones since PE about 2 years ago. Denies fevers or chills. Denies trauma. No pain or vaginal discharge. Has visit with ob next Thursday already scheduled.   PM[facility], [facility], social history reviewed and updated  Review of Systems  Constitutional: Negative.   HENT: Negative.   Eyes: Negative.   Respiratory: Negative for cough, chest tightness and shortness of breath.   Cardiovascular: Negative for chest pain, palpitations and leg swelling.  Gastrointestinal: Negative for abdominal distention, abdominal pain, constipation, diarrhea, nausea and vomiting.  Genitourinary: Positive for vaginal bleeding.  Musculoskeletal: Positive for arthralgias, back pain and gait problem.  Skin: Negative.   Neurological: Negative for dizziness and weakness.  Psychiatric/Behavioral: Negative.     Objective:  Physical Exam Constitutional:      Appearance: She is well-developed. She is obese.  HENT:     Head: Normocephalic and atraumatic.  Neck:     Musculoskeletal: Normal range of motion.  Cardiovascular:     Rate and Rhythm: Normal rate.  Pulmonary:     Effort: Pulmonary effort is normal. No respiratory distress.     Breath sounds: Normal breath sounds. No wheezing or rales.  Abdominal:     General: Bowel sounds are normal. There is no distension.     Palpations: Abdomen is soft.     Tenderness: There is no abdominal tenderness. There is no rebound.   Skin:    General: Skin is warm and dry.  Neurological:     Mental Status: She is alert and oriented to person, place, and time.     Coordination: Coordination abnormal.     Comments: Wheelchair for ambulation     Vitals:   04/02/19 1317  BP: 134/84  Pulse: 64  Temp: 97.9 F (36.6 C)  TempSrc: Oral  SpO2: 98%  Weight: (!) 326 lb (147.9 kg)  Height: 5' 5"  (1.651 m)    This visit occurred during the SARS-CoV-2 public health emergency.  Safety protocols were in place, including screening questions prior to the visit, additional usage of staff PPE, and extensive cleaning of exam room while observing appropriate contact time as indicated for disinfecting solutions.   Assessment & Plan:

## 2019-04-02 NOTE — Patient Instructions (Signed)
I think it is okay to wait until last Thursday to get the ultrasound of the uterus and ovaries.

## 2019-04-03 DIAGNOSIS — N950 Postmenopausal bleeding: Secondary | ICD-10-CM | POA: Insufficient documentation

## 2019-04-03 NOTE — Assessment & Plan Note (Signed)
Needs [place] and potential endometrial biopsy depending on results of [place]. Offered to order [place] but she defers to ob/gyn visit as they can likely do this during visit with them. No current bleeding. Talked about if bleeding more than 1 pad per hour go to [facility]. If bleeding lesser amounts call our office or ob/gyn office.

## 2019-04-03 NOTE — Assessment & Plan Note (Addendum)
Reminded her that the eliquis makes her more likely to bleed. She would need to avoid hormones depending on results of workup for the bleeding. Could do copper IUD if needed.

## 2019-04-08 ENCOUNTER — Ambulatory Visit: Payer: 59 | Admitting: Family

## 2019-04-08 ENCOUNTER — Other Ambulatory Visit: Payer: 59

## 2019-04-08 ENCOUNTER — Inpatient Hospital Stay: Payer: 59 | Admitting: Internal Medicine

## 2019-04-08 ENCOUNTER — Other Ambulatory Visit: Payer: Self-pay | Admitting: Internal Medicine

## 2019-04-12 ENCOUNTER — Encounter: Payer: Self-pay | Admitting: Family

## 2019-04-13 ENCOUNTER — Encounter: Payer: Self-pay | Admitting: Family

## 2019-04-17 ENCOUNTER — Other Ambulatory Visit: Payer: Self-pay | Admitting: Internal Medicine

## 2019-04-19 ENCOUNTER — Encounter: Payer: Self-pay | Admitting: Family

## 2019-04-22 ENCOUNTER — Encounter: Payer: Self-pay | Admitting: Internal Medicine

## 2019-04-22 ENCOUNTER — Telehealth: Payer: Self-pay | Admitting: Family

## 2019-04-22 NOTE — Telephone Encounter (Signed)
I was able to speak with Cassie [name] regarding the results of her recent endometrial biopsy. This showed no evidence of cancer but states she did have simple hyperplasia. Her gynecologist discussed her having an IUD placed and she is still considering this.  She has also been having a low heart rate and has an appointment with her cardiologist this week on Wednesday.  She will let us know what decision she makes regarding the IUD as well as what comes from her cardiology follow-up.

## 2019-04-24 ENCOUNTER — Ambulatory Visit: Payer: 59 | Admitting: Internal Medicine

## 2019-05-08 ENCOUNTER — Encounter: Payer: Self-pay | Admitting: Family

## 2019-05-08 ENCOUNTER — Encounter: Payer: Self-pay | Admitting: Internal Medicine

## 2019-05-08 DIAGNOSIS — N950 Postmenopausal bleeding: Secondary | ICD-10-CM

## 2019-05-16 ENCOUNTER — Encounter: Payer: Self-pay | Admitting: Family

## 2019-05-20 ENCOUNTER — Telehealth: Payer: Self-pay | Admitting: Family

## 2019-05-20 NOTE — Telephone Encounter (Signed)
Called and spoke with patient regarding r/s her 1/29 appointments out until 3/29 per 1/8 message.  She was ok with date/time

## 2019-05-27 NOTE — Progress Notes (Signed)
Virtual Visit via Video Note   This visit type was conducted due to national recommendations for restrictions regarding the COVID-19 Pandemic (e.g. social distancing) in an effort to limit this patient's exposure and mitigate transmission in our community.  Due to her co-morbid illnesses, this patient is at least at moderate risk for complications without adequate follow up.  This format is felt to be most appropriate for this patient at this time.  All issues noted in this document were discussed and addressed.  A limited physical exam was performed with this format.  Please refer to the patient's chart for her consent to telehealth for [facility]   Date:  05/29/2019   ID:  Cassie [name], DOB Jan 04, 1961, MRN [id number]  Patient Location: Home Provider Location: Office  PCP:  Hoyt Koch, MD  Cardiologist:  Nelva Bush, MD  Electrophysiologist:  None   Evaluation Performed:  Follow-Up Visit  Chief Complaint:  Palpitations  History of Present Illness:    Cassie [name] is a 59 y.o. female with with history of palpitations and PVCs,chest pain with myocardial bridging in the mid LAD,pulmonary embolism on apixaban, ulcerative colitis, anxiety, vitamin B12 deficiency, vitamin D deficiency, and morbid obesity.  We are speaking to for follow-up of palpitations.  Cassie [name] reports that her palpitations seem to have become more frequent over the last few months.  She is feeling more flutters, consistent with previously documented atrial runs.  They now happen multiple times a day at times.  She notes occasional lightheadedness as well, though she is not sure if the lightheadedness is directly associated with the aforementioned palpitations.  She is concerned that she may be having episodes of atrial fibrillation or flutter, as a close friend recently had a stroke in the setting of atrial flutter.  She remains on atenolol 12.5 mg BID but is concerned about her resting heart  rate occasionally dipping into the low 50's and upper 40's, as measured on a pulsoximeter at home.  [name] notes that her breathing has actually improved a little since we last spoke.  She attributes this to regular use of an incentive spirometer that she was given after gallbladder surgery ~4 years ago.  She denies shortness of breath.  Weight and chronic ankle edema are stable.  Cassie [name] has not been able to exercise regularly due to severe low back pain, as well as recurrent plantar fasciitis.  The patient does not have symptoms concerning for COVID-19 infection (fever, chills, cough, or new shortness of breath).    Past Medical History:  Diagnosis Date  . Adrenal nodule ([place])   . Allergy   . Anemia    20 yrs. ago not now- pt denies   . Anxiety   . Arthritis    knees,thumbs  . Cardiac arrhythmia    palpitations,PVC'sAC  . Cataract    bilateral removed   . Colon polyp    pseudo  . Cough variant asthma 01/01/2018  . Cyst of kidney, acquired    cyst right kidney- benign  . Gallstones   . Morbid obesity ([place])   . OSA (obstructive sleep apnea) 03/12/2018  . OSA on CPAP 03/12/2018   Mild OSA with AHI 6.9/hr with nocturnal hypoxemia now on CPAP  . Osteoporosis    knees  . Other allergy, other than to medicinal agents   . Panic disorder   . Pulmonary embolism ([place])   . Run of atrial premature complexes   . Ulcerative colitis, unspecified   .  Vitamin B12 deficiency   . Vitamin D deficiency    Past Surgical History:  Procedure Laterality Date  . BREAST BIOPSY  11/2011   Left- benign  . CATARACT EXTRACTION Right   . CATARACT EXTRACTION Left   . CHOLECYSTECTOMY N/A 01/17/2014   Procedure: LAPAROSCOPIC CHOLECYSTECTOMY;  Surgeon: Excell Seltzer, MD;  Location: [facility];  Service: General;  Laterality: N/A;  . COLONOSCOPY    . RETINAL LASER PROCEDURE Left   . SIGMOIDOSCOPY       Current Meds  Medication Sig  . acetaminophen (TYLENOL) 325 MG tablet Take 650 mg by mouth  every 6 (six) hours as needed.  . ALPRAZolam (XANAX) 1 MG tablet TAKE 0.5-1 TABLETS (0.5-1 MG TOTAL) BY MOUTH 2 (TWO) TIMES DAILY AS NEEDED FOR ANXIETY.  . Ascorbic Acid (VITAMIN C PO) Take by mouth daily as needed.  Cassie [name] atenolol (TENORMIN) 25 MG tablet Take 0.5 tablets (12.5 mg total) by mouth 2 (two) times daily.  . Cholecalciferol (VITAMIN D3) 2000 units capsule Take 2,000 Units by mouth as needed.   Cassie [name] ELIQUIS 5 MG TABS tablet TAKE 1 TABLET BY MOUTH TWICE DAILY  . loperamide (IMODIUM A-D) 2 MG tablet Take 2 mg by mouth 4 (four) times daily as needed for diarrhea or loose stools.  Vladimir Faster Glycol-Propyl Glycol (SYSTANE OP) Place 1 drop into both eyes daily as needed (dry eyes).  . Probiotic Product (PROBIOTIC DAILY PO) Take 2 capsules by mouth daily with lunch.   . Simethicone (GAS-X PO) Take 1 tablet by mouth 4 (four) times daily as needed (For gas.). Do not exceed 6 tablets in a 24 hour period.  . vitamin B-12 (CYANOCOBALAMIN) 500 MCG tablet Take 500 mcg by mouth as needed.      Allergies:   Doxycycline, Hydrocodone, Sudafed [pseudoephedrine hcl], Acular [ketorolac tromethamine], Diphenhydramine hcl, Lactose intolerance (gi), Mesalamine, Sulfasalazine, Azithromycin, Caffeine, Penicillins, and Prednisone   Social History   Tobacco Use  . Smoking status: Former Smoker    Packs/day: 2.00    Years: 12.00    Pack years: 24.00    Types: Cigarettes    Quit date: 03/07/1979    Years since quitting: 40.2  . Smokeless tobacco: Never Used  Substance Use Topics  . Alcohol use: No    Alcohol/week: 0.0 standard drinks  . Drug use: No     Family Hx: The patient's family history includes Atrial fibrillation in her father; Heart disease (age of onset: 56) in her father; Hypertension in her maternal aunt and maternal grandfather. There is no history of Colon cancer, Stomach cancer, Colon polyps, Esophageal cancer, or Rectal cancer.  ROS:   Please see the history of present illness.   All other  systems reviewed and are negative.   Prior CV studies:   The following studies were reviewed today:  EP Procedures and Devices:  30-day event monitor (03/27/17):Predominantly sinus rhythm with PACs, PVCs, and brief paroxysmal atrial tachycardia. No sustained arrhythmia or prolonged pause.  48-hour Holter monitor:Predominantly sinus rhythm with rare PACs, PVCs, and very short atrial runs. No significant arrhythmia.  Non-Invasive Evaluation(s):  Cardiac CTA (11/25/16): Normal aorta. Trileaflet aortic valve without calcification. Right dominant coronary arteries with normal origins. LMCA is normal. LAD has mild calcified plaque in the proximal segment (0-25%). Mid LAD has a 12 mm intramyocardial bridge. CT FFR of distal LAD 0.8. LCx is nondominant and normal. RCA is large with minimal calcification and no significant stenosis. Coronary artery calcium score 54.  CTA chest (09/09/16): No  PE. Coronary artery calcification involving the LAD is noted. No significant lung abnormalities.  TTE (07/04/16): Nromal LV size and contraction (LVEF 55-60%) with normal diastolic function. Normal RV size and function. No significant valvular abnormalities.  Pharmacologic MPI (01/01/04): Abnormal study without ischemia or scar. No significant EKG abnormalities. Normal LV size and function.  Labs/Other Tests and Data Reviewed:    EKG:  No ECG reviewed.  Recent Labs: 01/07/2019: ALT 13; BUN 9; Creatinine 0.86; Hemoglobin 12.9; Platelet Count 235; Potassium 3.8; Sodium 139   Recent Lipid Panel Lab Results  Component Value Date/Time   CHOL 149 06/14/2018 09:47 AM   TRIG 87.0 06/14/2018 09:47 AM   HDL 33.20 (L) 06/14/2018 09:47 AM   CHOLHDL 4 06/14/2018 09:47 AM   LDLCALC 98 06/14/2018 09:47 AM    Wt Readings from Last 3 Encounters:  05/29/19 (!) 326 lb (147.9 kg)  04/02/19 (!) 326 lb (147.9 kg)  03/29/19 (!) 326 lb (147.9 kg)     Objective:    Vital Signs:  Ht 5' 5"  (1.651 m)   Wt (!) 326 lb  (147.9 kg)   BMI 54.25 kg/m    VITAL SIGNS:  reviewed GEN:  no acute distress  ASSESSMENT & PLAN:    Palpitations with PAT Ms. Durand was previously documented to have PAC's, PVC's and brief atrial runs most consistent.  These had been controlled with atenolol in the past but seem to have increased in frequency.  She has not tolerated escalation of beta blockade due to bradycardia in the past and now reports resting heart rates in the upper 40's at times on low-dose atenolol.  We have agreed to obtain a 14 day event monitor for further evaluation.  I also suggested that she consider an Apple Watch or Cartia device so that she can monitor her rhythm at home after completion of the event monitor.  In the meantime, we will continue current dose of atenolol.  Chest pain: No significant chest pain reported, with prior cardiac CTA notable primarily for myocardial bridging of the mid LAD but no obstructive CAD.  Continue medical therapy and risk factor modification.  History of pulmonary embolism: On indefinite apixaban managed by Dr. Marin Olp.  Dyspnea on exertion and edema: Multifactorial and stable.  Continue current medications.  Morbid obesity: Likely underlying many of her symptoms, including dyspnea on exertion and back pain.  Mobility is limited due to back and foot pain.  Ms. Lye is trying to watch what she eats in order to help lose weight.  Follow-up: Virtual visit in 6-8 weeks.  Time:   Today, I have spent 23 minutes with the patient with telehealth technology discussing the above problems.     Medication Adjustments/Labs and Tests Ordered: Current medicines are reviewed at length with the patient today.  Concerns regarding medicines are outlined above.   Tests Ordered: Orders Placed This Encounter  Procedures  . LONG TERM MONITOR (3-14 DAYS)    Medication Changes: None.  Follow Up:  Virtual Visit  6-8 weeks  Signed, Nelva Bush, MD  05/29/2019 5:05 PM     [place]

## 2019-05-29 ENCOUNTER — Encounter: Payer: Self-pay | Admitting: Internal Medicine

## 2019-05-29 ENCOUNTER — Telehealth (INDEPENDENT_AMBULATORY_CARE_PROVIDER_SITE_OTHER): Payer: 59 | Admitting: Internal Medicine

## 2019-05-29 ENCOUNTER — Other Ambulatory Visit: Payer: Self-pay

## 2019-05-29 VITALS — Ht 65.0 in | Wt 326.0 lb

## 2019-05-29 DIAGNOSIS — R0789 Other chest pain: Secondary | ICD-10-CM

## 2019-05-29 DIAGNOSIS — R0600 Dyspnea, unspecified: Secondary | ICD-10-CM

## 2019-05-29 DIAGNOSIS — Z86711 Personal history of pulmonary embolism: Secondary | ICD-10-CM

## 2019-05-29 DIAGNOSIS — R42 Dizziness and giddiness: Secondary | ICD-10-CM | POA: Diagnosis not present

## 2019-05-29 DIAGNOSIS — R002 Palpitations: Secondary | ICD-10-CM | POA: Diagnosis not present

## 2019-05-29 DIAGNOSIS — I471 Supraventricular tachycardia: Secondary | ICD-10-CM

## 2019-05-29 DIAGNOSIS — R0609 Other forms of dyspnea: Secondary | ICD-10-CM

## 2019-05-29 DIAGNOSIS — R600 Localized edema: Secondary | ICD-10-CM

## 2019-05-29 NOTE — Patient Instructions (Signed)
Medication Instructions:  Your physician recommends that you continue on your current medications as directed. Please refer to the Current Medication list given to you today.  *If you need a refill on your cardiac medications before your next appointment, please call your pharmacy*  Lab Work: none If you have labs (blood work) drawn today and your tests are completely normal, you will receive your results only by: Marland Kitchen MyChart Message (if you have MyChart) OR . A paper copy in the mail If you have any lab test that is abnormal or we need to change your treatment, we will call you to review the results.  Testing/Procedures: Your physician has recommended that you wear an 14 DAY ZIO event monitor. Event monitors are medical devices that record the heart's electrical activity. Doctors most often [place] these monitors to diagnose arrhythmias. Arrhythmias are problems with the speed or rhythm of the heartbeat. The monitor is a small, portable device. You can wear one while you do your normal daily activities. This is usually used to diagnose what is causing palpitations/syncope (passing out). This will be directly shipped to you from [facility]- they will call you just prior to receipt of the monitor or when you actually receive the monitor.  If you get a call from an 800# or a 224 area code in the next 2-5 business days, then please answer the call.    Follow-Up: At [facility], you and your health needs are our priority.  As part of our continuing mission to provide you with exceptional heart care, we have created designated Provider Care Teams.  These Care Teams include your primary Cardiologist (physician) and Advanced Practice Providers (APPs -  Physician Assistants and Nurse Practitioners) who all work together to provide you with the care you need, when you need it.  Your next appointment:   6-8 week(s) Virtual Visit  The format for your next appointment:   Virtual  Provider:    You may see  Nelva Bush, MD or one of the following Advanced Practice Providers on your designated Care Team:    Murray Hodgkins, NP  Christell Faith, PA-C  Marrianne Mood, PA-C

## 2019-06-02 ENCOUNTER — Ambulatory Visit (INDEPENDENT_AMBULATORY_CARE_PROVIDER_SITE_OTHER): Payer: 59

## 2019-06-02 DIAGNOSIS — R42 Dizziness and giddiness: Secondary | ICD-10-CM

## 2019-06-02 DIAGNOSIS — R002 Palpitations: Secondary | ICD-10-CM | POA: Diagnosis not present

## 2019-06-07 ENCOUNTER — Telehealth: Payer: 59 | Admitting: Family

## 2019-06-07 ENCOUNTER — Other Ambulatory Visit: Payer: 59

## 2019-06-07 ENCOUNTER — Ambulatory Visit: Payer: 59 | Admitting: Family

## 2019-06-15 ENCOUNTER — Other Ambulatory Visit: Payer: Self-pay | Admitting: Internal Medicine

## 2019-06-18 ENCOUNTER — Other Ambulatory Visit: Payer: Self-pay

## 2019-06-20 ENCOUNTER — Other Ambulatory Visit (HOSPITAL_COMMUNITY)
Admission: RE | Admit: 2019-06-20 | Discharge: 2019-06-20 | Disposition: A | Discharge status: Home / Self Care | Payer: 59 | Source: Ambulatory Visit | Attending: Obstetrics & Gynecology | Admitting: Obstetrics & Gynecology

## 2019-06-20 ENCOUNTER — Ambulatory Visit: Payer: 59 | Admitting: Obstetrics & Gynecology

## 2019-06-20 ENCOUNTER — Other Ambulatory Visit: Payer: Self-pay

## 2019-06-20 ENCOUNTER — Encounter: Payer: Self-pay | Admitting: Obstetrics & Gynecology

## 2019-06-20 VITALS — BP 118/72 | HR 76 | Temp 97.2°F | Resp 12 | Ht 65.0 in | Wt 324.0 lb

## 2019-06-20 DIAGNOSIS — Z86711 Personal history of pulmonary embolism: Secondary | ICD-10-CM

## 2019-06-20 DIAGNOSIS — N8501 Benign endometrial hyperplasia: Secondary | ICD-10-CM | POA: Diagnosis not present

## 2019-06-20 DIAGNOSIS — Z124 Encounter for screening for malignant neoplasm of cervix: Secondary | ICD-10-CM | POA: Diagnosis not present

## 2019-06-20 DIAGNOSIS — Z7901 Long term (current) use of anticoagulants: Secondary | ICD-10-CM

## 2019-06-20 DIAGNOSIS — N950 Postmenopausal bleeding: Secondary | ICD-10-CM | POA: Diagnosis not present

## 2019-06-20 MED ORDER — NORETHINDRONE ACETATE 5 MG PO TABS: 10.0000 mg | ORAL_TABLET | Freq: Every day | ORAL | 5 refills | Status: DC

## 2019-06-20 NOTE — Progress Notes (Signed)
59 y.o. G0P0000 Married White or Caucasian female here as a new patient for a second opinion for vaginal bleeding. Patient was seen by Dr. Murrell Redden at [facility] in December 2020.  P reports having bleeding in November so she was seen in the ER on 03/29/2019.  This was associated with cramping and pelvic pain.  She had bleeding with some clotting at that time.  Had not had any bleeding since going into menopause.  Review of records show she was on OCPs until 2012-2013.  She had an [facility] of 50 in 2012 and transitioned to HRT in 2013.  ER note reviewed.  No lab work or imaging was done in the ER that day as bleeding was mild.  She did follow up with [facility] and had an endometrial biopsy on 04/11/2019 showing simple endometrial hyperplasia.  She was advised she needed treatment with either progesterone therapy or Mirena IUD.  Did not feel she had her questions answered and just not comfortable with recommendation at that time.  Here for another opinion.  She is currently not bleeding.  She does have h/o a pulmonary emboli is on Eliquis long term.  Was on [facility] prior to PE  Which was diagnosed 07/04/2016.  Has been followed in hematology by Dr. Marin Olp.  She does have questions today about treatment options including hysterectomy.    I reviewed all her records that were brought with her from [facility].  Records included 98 pages.  Ultrasound from December is not in outside records.  Also, last pap and neg HR HPV was 07/2014.  Discussion:  Pathology was reviewed with pt.  Types of hyperplasia and treatment options reviewed.  Although hysterectomy is a consideration in PMP women with hyperplasia, considering her h/o PE, long term anticoagulation and obesity, there are significant risks to consider surgery especially when we have not even attempted a more conservative approach.  Mirena IUD discussed including placement and follow up biopsies.  She would prefer not to use this if possible.  Oral  progesterone discussed.  Treatment vs maintenance discussed.  She will need to be on this long term unless she has significant weight loss due to exogenous estrogen production.  Pt voices clear understanding.  No LMP recorded. Patient is postmenopausal.          Sexually active: No.  The current method of family planning is post menopausal status.    Exercising: No.  Exercise is limited by Degenerative Disc Disease. Smoker:  no  Health Maintenance: Pap:  07/21/14 Neg:Neg HR HPV History of abnormal Pap:  Yes, years ago; per patient repeat pap was normal MMG:  06/14/18 BIRADS 2 benign/density a Colonoscopy:  11/18/15 polyp removed -- patient is due and has Ulcerative Colitis BMD:   04/07/16 Normal TDaP:  2012 Pneumonia vaccine(s):  n/a Shingrix:   no Hep C testing: Neg in the past Screening Labs: discuss today   reports that she quit smoking about 40 years ago. Her smoking use included cigarettes. She has a 24.00 pack-year smoking history. She has never used smokeless tobacco. She reports that she does not drink alcohol or use drugs.  Past Medical History:  Diagnosis Date  . Adrenal nodule ([place])   . Allergy   . Anemia    20 yrs. ago not now- pt denies   . Anxiety   . Arthritis    knees,thumbs  . Cardiac arrhythmia    palpitations,PVC'sAC  . Cataract    bilateral removed   . Colon polyp  pseudo  . Cyst of kidney, acquired    cyst right kidney- benign  . Degenerative disc disease, lumbar   . Edema   . Gallstones   . Morbid obesity ([place])   . Osteoarthritis   . Other allergy, other than to medicinal agents   . Panic disorder   . Pulmonary embolism ([place])   . Run of atrial premature complexes   . Ulcerative colitis, unspecified   . Vitamin B12 deficiency   . Vitamin D deficiency     Past Surgical History:  Procedure Laterality Date  . BREAST BIOPSY  11/2011   Left- benign  . CATARACT EXTRACTION Right   . CATARACT EXTRACTION Left   . CHOLECYSTECTOMY N/A 01/17/2014    Procedure: LAPAROSCOPIC CHOLECYSTECTOMY;  Surgeon: Excell Seltzer, MD;  Location: [facility];  Service: General;  Laterality: N/A;  . COLONOSCOPY    . RETINAL LASER PROCEDURE Left   . SIGMOIDOSCOPY      Current Outpatient Medications  Medication Sig Dispense Refill  . acetaminophen (TYLENOL) 325 MG tablet Take 650 mg by mouth every 6 (six) hours as needed.    . ALPRAZolam (XANAX) 1 MG tablet TAKE 0.5-1 TABLETS (0.5-1 MG TOTAL) BY MOUTH 2 (TWO) TIMES DAILY AS NEEDED FOR ANXIETY. 60 tablet 4  . Ascorbic Acid (VITAMIN C PO) Take by mouth daily as needed.    Marland Kitchen atenolol (TENORMIN) 25 MG tablet Take 0.5 tablets (12.5 mg total) by mouth 2 (two) times daily. (Patient taking differently: Take 15 mg by mouth 2 (two) times daily. ) 90 tablet 2  . Cholecalciferol (VITAMIN D3) 2000 units capsule Take 2,000 Units by mouth as needed.     Marland Kitchen ELIQUIS 5 MG TABS tablet TAKE 1 TABLET BY MOUTH TWICE DAILY 60 tablet 6  . loperamide (IMODIUM A-D) 2 MG tablet Take 2 mg by mouth 4 (four) times daily as needed for diarrhea or loose stools.    Vladimir Faster Glycol-Propyl Glycol (SYSTANE OP) Place 1 drop into both eyes daily as needed (dry eyes).    . Probiotic Product (PROBIOTIC DAILY PO) Take 2 capsules by mouth daily with lunch.     . Simethicone (GAS-X PO) Take 1 tablet by mouth 4 (four) times daily as needed (For gas.). Do not exceed 6 tablets in a 24 hour period.    . vitamin B-12 (CYANOCOBALAMIN) 500 MCG tablet Take 500 mcg by mouth as needed.      No current facility-administered medications for this visit.    Family History  Problem Relation Age of Onset  . Heart disease Father 24       pacemaker  . Atrial fibrillation Father   . Hypertension Maternal Aunt   . Hypertension Maternal Grandfather   . Colon cancer Neg Hx   . Stomach cancer Neg Hx   . Colon polyps Neg Hx   . Esophageal cancer Neg Hx   . Rectal cancer Neg Hx     Review of Systems  All other systems reviewed and are negative.   Exam:    BP 118/72 (BP Location: Left Arm, Patient Position: Sitting, Cuff Size: Large)   Pulse 76   Temp (!) 97.2 F (36.2 C) (Temporal)   Resp 12   Ht 5' 5"  (1.651 m)   Wt (!) 324 lb (147 kg)   BMI 53.92 kg/m   Height: 5' 5"  (165.1 cm)  Ht Readings from Last 3 Encounters:  06/20/19 5' 5"  (1.651 m)  05/29/19 5' 5"  (1.651 m)  04/02/19 5' 5"  (  1.651 m)    General appearance: alert, cooperative and appears stated age Head: Normocephalic, without obvious abnormality, atraumatic Abdomen: soft, non-tender; bowel sounds normal; no masses,  no organomegaly Extremities: extremities normal, atraumatic, no cyanosis or edema Skin: Skin color, texture, turgor normal. No rashes or lesions Lymph nodes: Cervical, supraclavicular, and axillary nodes normal. No abnormal inguinal nodes palpated Neurologic: Grossly normal  Pelvic: External genitalia:  no lesions              Urethra:  normal appearing urethra with no masses, tenderness or lesions              Bartholins and Skenes: normal                 Vagina: normal appearing vagina with normal color and discharge, no lesions              Cervix: no lesions              Pap taken: Yes.   Bimanual Exam:  Uterus:  normal size, contour, position, consistency, mobility, non-tender              Adnexa: normal adnexa and no mass, fullness, tenderness               Anus:  no lesions  Chaperone, Terence Lux, CMA, was present for exam.  A:  Simple endometrial hyperplasia  Morbid obesity H/o PE while on HRT (no off), on Eliquis H/o palpitations Bilateral adrenal nodules diagnosed on CT with PE, seen by Dr. Cruzita Lederer  P:   Pap and HR HPV obtained today Repeat endometrial biopsy in 3 months Start aygestin 28m daily.  #30 day supply/5RF [facility] as has been about two months since biopsy without any treatment.  Return for repeat endometrial biopsy in 3 months and then again in 6 months.  If she  does not have resolution in hyperplasia or has progression, will need to change treatment.  50 minutes spent in total with exam and [facility]

## 2019-06-21 ENCOUNTER — Encounter: Payer: Self-pay | Admitting: Obstetrics & Gynecology

## 2019-06-21 LAB — CYTOLOGY - PAP
Comment: NEGATIVE
Diagnosis: NEGATIVE
High risk HPV: NEGATIVE

## 2019-06-22 ENCOUNTER — Encounter: Payer: Self-pay | Admitting: Family

## 2019-06-23 ENCOUNTER — Encounter: Payer: Self-pay | Admitting: Obstetrics & Gynecology

## 2019-06-23 DIAGNOSIS — Z7901 Long term (current) use of anticoagulants: Secondary | ICD-10-CM | POA: Insufficient documentation

## 2019-06-23 DIAGNOSIS — N8501 Benign endometrial hyperplasia: Secondary | ICD-10-CM | POA: Insufficient documentation

## 2019-07-03 ENCOUNTER — Other Ambulatory Visit: Payer: Self-pay

## 2019-07-08 ENCOUNTER — Ambulatory Visit (INDEPENDENT_AMBULATORY_CARE_PROVIDER_SITE_OTHER): Payer: 59 | Admitting: Internal Medicine

## 2019-07-08 ENCOUNTER — Encounter: Payer: Self-pay | Admitting: Internal Medicine

## 2019-07-08 DIAGNOSIS — K51919 Ulcerative colitis, unspecified with unspecified complications: Secondary | ICD-10-CM

## 2019-07-08 DIAGNOSIS — R1031 Right lower quadrant pain: Secondary | ICD-10-CM | POA: Diagnosis not present

## 2019-07-08 DIAGNOSIS — N950 Postmenopausal bleeding: Secondary | ICD-10-CM | POA: Diagnosis not present

## 2019-07-08 NOTE — Assessment & Plan Note (Signed)
It is not clear that this is related, she is not having any bleeding but did start hormone therapy in the last several weeks.

## 2019-07-08 NOTE — Assessment & Plan Note (Signed)
Getting CT abdomen/pelvis to rule out diverticulitis, colitis flare, uterine problem. Checking CBC, CMP, lipase to rule out additional causes.

## 2019-07-08 NOTE — Progress Notes (Signed)
Virtual Visit via Video Note  I connected with Cassie [name] on 07/08/19 at 10:20 AM EST by a video enabled telemedicine application and verified that I am speaking with the correct person using two identifiers.  The patient and the provider were at separate locations throughout the entire encounter.   I discussed the limitations of evaluation and management by telemedicine and the availability of in person appointments. The patient expressed understanding and agreed to proceed. The patient and the provider were the only parties present for the visit unless noted in HPI below.  History of Present Illness: The patient is a 59 y.o. female with visit for right lower abdomen pain. Started weeks ago but has been worsening. Does have ulcerative colitis but this does not feel like typical flare. Denies diarrhea or blood in stool. Did have vaginal bleeding back in November with endometrial biopsy with hyperplasia. Seen ob/gyn and they started her on progestin pills about 2 weeks ago. She is not sure if that is why this is worse. Pain 7/10 and most of the time. A little better when walking. She denies nausea or vomiting. Denies fevers or chills. RLQ and radiates to the right hip region. Prior gallbladder removal. Overall it is worsening. Has tried nothing for pain.  Observations/Objective: Appearance: overweight, breathing appears normal, casual grooming, abdomen does not appear distended, some pain to self-palpation rlq, throat normal, memory normal, mental status is A and O times 3  Assessment and Plan: See problem oriented charting  Follow Up Instructions: CT abdomen and pelvis with contrast, CBC, CMP and lipase  I discussed the assessment and treatment plan with the patient. The patient was provided an opportunity to ask questions and all were answered. The patient agreed with the plan and demonstrated an understanding of the instructions.   The patient was advised to call back or seek an in-person  evaluation if the symptoms worsen or if the condition fails to improve as anticipated.  Hoyt Koch, MD

## 2019-07-08 NOTE — Assessment & Plan Note (Signed)
Not on meds currently and she is not convinced that this is a flare. Checking CBC, CMP, lipase and CT abdomen and pelvis.

## 2019-07-09 NOTE — Progress Notes (Signed)
Virtual Visit via Video Note   This visit type was conducted due to national recommendations for restrictions regarding the COVID-19 Pandemic (e.g. social distancing) in an effort to limit this patient's exposure and mitigate transmission in our community.  Due to her co-morbid illnesses, this patient is at least at moderate risk for complications without adequate follow up.  This format is felt to be most appropriate for this patient at this time.  All issues noted in this document were discussed and addressed.  A limited physical exam was performed with this format.  Please refer to the patient's chart for her consent to telehealth for [facility].   Date:  07/10/2019   ID:  Cassie [name], DOB Dec 29, 1960, MRN [id number]  Patient Location: Home Provider Location: Office  PCP:  Hoyt Koch, MD  Cardiologist:  Nelva Bush, MD  Electrophysiologist:  None   Evaluation Performed:  Follow-Up Visit  Chief Complaint: Palpitations and discussed recent event monitor report  History of Present Illness:    Cassie [name] is a 59 y.o. female with history of palpitations and PVCs,chest pain with myocardial bridging in the mid LAD,pulmonary embolism on apixaban, ulcerative colitis, anxiety, vitamin B12 deficiency, vitamin D deficiency, and morbid obesity.  We last spoke in 05/2019, at which time Cassie [name] complained of more frequent palpitations.  Subsequent event monitor showed predominantly sinus rhythm with rare PAC's, PVC's, brief PSVT, and a single 5 beat run of NSVT followed by transient AIVR.  Today, Cassie [name] reports feeling about the same as when we last spoke.  She has "good days and bad days."  She occasionally will feel just brief skips or flutters in her heartbeat.  Other days, she notes more persistent palpitations.  She has stable exertional dyspnea with modest activity.  Her activity is mostly limited, however, by chronic low back pain.  She has difficulty  walking more than a few feet because her back begins to "lock up."  Cassie [name] also notes occasional transient chest discomfort lasting a few seconds.  She feels as though it is soreness of her chest wall.  She does not have any exertional chest pain.  Lower extremity edema seems to wax and wane.  Overall, it is similar to prior visits.  Cassie [name] is concerned about recent findings of PSVT and single episode of NSVT with brief AIVR.  Her resting heart rate has continued to trend down and is now often in the low 50s or even occasionally upper 40s.  Past Medical History:  Diagnosis Date   Adrenal nodule (HCC)    Allergy    Anxiety    Arthritis    knees,thumbs   Cardiac arrhythmia    palpitations,PVC'sAC   Cataract    bilateral removed    Cyst of kidney, acquired    cyst right kidney- benign   Degenerative disc disease, lumbar    Edema    Gallstones    History of anemia    20 yrs. ago not now- pt denies    Morbid obesity ([place])    Osteoarthritis    Other allergy, other than to medicinal agents    Panic disorder    Pulmonary embolism ([place])    Run of atrial premature complexes    Ulcerative colitis, unspecified    Vitamin B12 deficiency    Vitamin D deficiency    Past Surgical History:  Procedure Laterality Date   BREAST BIOPSY  11/2011   Left- benign   CATARACT EXTRACTION Right  CATARACT EXTRACTION Left    CHOLECYSTECTOMY N/A 01/17/2014   Procedure: LAPAROSCOPIC CHOLECYSTECTOMY;  Surgeon: Excell Seltzer, MD;  Location: [facility];  Service: General;  Laterality: N/A;   COLONOSCOPY     RETINAL LASER PROCEDURE Left    SIGMOIDOSCOPY       Current Meds  Medication Sig   acetaminophen (TYLENOL) 325 MG tablet Take 650 mg by mouth every 6 (six) hours as needed.   ALPRAZolam (XANAX) 1 MG tablet TAKE 0.5-1 TABLETS (0.5-1 MG TOTAL) BY MOUTH 2 (TWO) TIMES DAILY AS NEEDED FOR ANXIETY.   Ascorbic Acid (VITAMIN C PO) Take by mouth daily as needed.    atenolol (TENORMIN) 25 MG tablet Take 0.5 tablets (12.5 mg total) by mouth 2 (two) times daily.   Cholecalciferol (VITAMIN D3) 2000 units capsule Take 2,000 Units by mouth as needed.    ELIQUIS 5 MG TABS tablet TAKE 1 TABLET BY MOUTH TWICE DAILY   loperamide (IMODIUM A-D) 2 MG tablet Take 2 mg by mouth 4 (four) times daily as needed for diarrhea or loose stools.   norethindrone (AYGESTIN) 5 MG tablet Take 2 tablets (10 mg total) by mouth daily.   Polyethyl Glycol-Propyl Glycol (SYSTANE OP) Place 1 drop into both eyes daily as needed (dry eyes).   Probiotic Product (PROBIOTIC DAILY PO) Take 2 capsules by mouth daily with lunch.    Simethicone (GAS-X PO) Take 1 tablet by mouth 4 (four) times daily as needed (For gas.). Do not exceed 6 tablets in a 24 hour period.   vitamin B-12 (CYANOCOBALAMIN) 500 MCG tablet Take 500 mcg by mouth as needed.      Allergies:   Doxycycline, Hydrocodone, Sudafed [pseudoephedrine hcl], Acular [ketorolac tromethamine], Diphenhydramine hcl, Lactose intolerance (gi), Mesalamine, Sulfasalazine, Azithromycin, Caffeine, Penicillins, and Prednisone   Social History   Tobacco Use   Smoking status: Former Smoker    Packs/day: 2.00    Years: 12.00    Pack years: 24.00    Types: Cigarettes    Quit date: 03/07/1979    Years since quitting: 40.3   Smokeless tobacco: Never Used  Substance Use Topics   Alcohol use: No    Alcohol/week: 0.0 standard drinks   Drug use: No     Family Hx: The patient's family history includes Atrial fibrillation in her father; Heart disease (age of onset: 81) in her father; Hypertension in her maternal aunt and maternal grandfather. There is no history of Colon cancer, Stomach cancer, Colon polyps, Esophageal cancer, or Rectal cancer.  ROS:   Please see the history of present illness.   All other systems reviewed and are negative.   Prior CV studies:   The following studies were reviewed today:  14-day event monitor  (06/02/19): Predominantly sinus rhythm with rare PACs and PVCs as well as brief PSVT and a single 5 beat run of NSVT followed by transient accelerated idioventricular rhythm.  Labs/Other Tests and Data Reviewed:    EKG:  No ECG reviewed.  Recent Labs: 07/10/2019: ALT 47; BUN 10; Creatinine, Ser 0.94; Hemoglobin 12.8; Platelets 262.0; Potassium 3.6; Sodium 138   Recent Lipid Panel Lab Results  Component Value Date/Time   CHOL 149 06/14/2018 09:47 AM   TRIG 87.0 06/14/2018 09:47 AM   HDL 33.20 (L) 06/14/2018 09:47 AM   CHOLHDL 4 06/14/2018 09:47 AM   LDLCALC 98 06/14/2018 09:47 AM    Wt Readings from Last 3 Encounters:  07/10/19 (!) 324 lb (147 kg)  06/20/19 (!) 324 lb (147 kg)  05/29/19 Marland Kitchen)  326 lb (147.9 kg)     Objective:    Vital Signs:  BP 122/64    Pulse (!) 54    Ht 5' 5"  (1.651 m)    Wt (!) 324 lb (147 kg)    SpO2 99%    BMI 53.92 kg/m    VITAL SIGNS:  reviewed GEN:  no acute distress  ASSESSMENT & PLAN:    Palpitations, PSVT, and NSVT: Overall symptoms are stable.  I reassured Cassie [name] that her event monitor is relatively stable from prior ones, although the finding of NSVT and transient AIVR is new.  Prior cardiac CT and echo did not reveal significant CAD or structural heart disease.  However, in the setting of increased palpitations and these event monitor findings, we have agreed to repeat an echocardiogram.  Labs drawn earlier today were notable for potassium of 3.6.  We will try to add on a magnesium level and TSH as well.  It may be beneficial to supplement her potassium to maintain a level greater than 4.0.  If symptoms persist and no obvious reversible cause for her palpitations and paroxysmal arrhythmias is found, I will refer Cassie [name] to electrophysiology for further evaluation and management.  We are unable to increase atenolol due to low resting heart rate.  I would like to continue at least low-dose beta-blocker, if possible, as this has helped some with  her palpitations in the past and is also beneficial for preventing symptoms from myocardial bridge noted on prior cardiac CTA.  Myocardial bridge: Minimal symptoms.  Continue heart rate control with low-dose atenolol.  Low resting heart rate precludes escalation.  History of pulmonary embolism: Continue anticoagulation and long-term follow-up per Dr. Marin Olp.  Morbid obesity: Weight loss encouraged through diet and exercise, though exertional dyspnea and chronic low back pain are barriers to mobility.  Time:   Today, I have spent 21 minutes with the patient with telehealth technology discussing the above problems.     Medication Adjustments/Labs and Tests Ordered: Current medicines are reviewed at length with the patient today.  Concerns regarding medicines are outlined above.   Tests Ordered: Orders Placed This Encounter  Procedures   ECHOCARDIOGRAM COMPLETE    Medication Changes: None.  Follow Up:  Either In Person or Virtual in 1 month(s)  Signed, Nelva Bush, MD  07/10/2019 1:12 PM    [facility]

## 2019-07-10 ENCOUNTER — Other Ambulatory Visit: Payer: Self-pay

## 2019-07-10 ENCOUNTER — Other Ambulatory Visit (HOSPITAL_BASED_OUTPATIENT_CLINIC_OR_DEPARTMENT_OTHER): Payer: 59

## 2019-07-10 ENCOUNTER — Telehealth: Payer: Self-pay | Admitting: Internal Medicine

## 2019-07-10 ENCOUNTER — Encounter: Payer: Self-pay | Admitting: Internal Medicine

## 2019-07-10 ENCOUNTER — Encounter: Payer: Self-pay | Admitting: Family

## 2019-07-10 ENCOUNTER — Other Ambulatory Visit: Payer: 59

## 2019-07-10 ENCOUNTER — Other Ambulatory Visit (INDEPENDENT_AMBULATORY_CARE_PROVIDER_SITE_OTHER): Payer: 59

## 2019-07-10 ENCOUNTER — Telehealth: Payer: Self-pay

## 2019-07-10 ENCOUNTER — Telehealth (INDEPENDENT_AMBULATORY_CARE_PROVIDER_SITE_OTHER): Payer: 59 | Admitting: Internal Medicine

## 2019-07-10 VITALS — BP 122/64 | HR 54 | Ht 65.0 in | Wt 324.0 lb

## 2019-07-10 DIAGNOSIS — Q245 Malformation of coronary vessels: Secondary | ICD-10-CM

## 2019-07-10 DIAGNOSIS — I4729 Other ventricular tachycardia: Secondary | ICD-10-CM | POA: Insufficient documentation

## 2019-07-10 DIAGNOSIS — I471 Supraventricular tachycardia: Secondary | ICD-10-CM | POA: Diagnosis not present

## 2019-07-10 DIAGNOSIS — R1031 Right lower quadrant pain: Secondary | ICD-10-CM

## 2019-07-10 DIAGNOSIS — Z6841 Body Mass Index (BMI) 40.0 and over, adult: Secondary | ICD-10-CM

## 2019-07-10 DIAGNOSIS — Z86711 Personal history of pulmonary embolism: Secondary | ICD-10-CM

## 2019-07-10 DIAGNOSIS — I472 Ventricular tachycardia: Secondary | ICD-10-CM

## 2019-07-10 DIAGNOSIS — R002 Palpitations: Secondary | ICD-10-CM | POA: Diagnosis not present

## 2019-07-10 LAB — CBC
HCT: 39.8 % (ref 36.0–46.0)
Hemoglobin: 12.8 g/dL (ref 12.0–15.0)
MCHC: 32.1 g/dL (ref 30.0–36.0)
MCV: 95.8 fl (ref 78.0–100.0)
Platelets: 262 10*3/uL (ref 150.0–400.0)
RBC: 4.16 Mil/uL (ref 3.87–5.11)
RDW: 15.2 % (ref 11.5–15.5)
WBC: 7.5 10*3/uL (ref 4.0–10.5)

## 2019-07-10 LAB — COMPREHENSIVE METABOLIC PANEL
ALT: 47 U/L — ABNORMAL HIGH (ref 0–35)
AST: 29 U/L (ref 0–37)
Albumin: 3.6 g/dL (ref 3.5–5.2)
Alkaline Phosphatase: 65 U/L (ref 39–117)
BUN: 10 mg/dL (ref 6–23)
CO2: 27 mEq/L (ref 19–32)
Calcium: 9 mg/dL (ref 8.4–10.5)
Chloride: 105 mEq/L (ref 96–112)
Creatinine, Ser: 0.94 mg/dL (ref 0.40–1.20)
GFR: 60.68 mL/min (ref >60.00)
Glucose, Bld: 104 mg/dL — ABNORMAL HIGH (ref 70–99)
Potassium: 3.6 mEq/L (ref 3.5–5.1)
Sodium: 138 mEq/L (ref 135–145)
Total Bilirubin: 0.4 mg/dL (ref 0.2–1.2)
Total Protein: 7.4 g/dL (ref 6.0–8.3)

## 2019-07-10 LAB — LIPASE: Lipase: 28 U/L (ref 11.0–59.0)

## 2019-07-10 NOTE — Patient Instructions (Signed)
Medication Instructions:  Your physician recommends that you continue on your current medications as directed. Please refer to the Current Medication list given to you today.  *If you need a refill on your cardiac medications before your next appointment, please call your pharmacy*   Lab Work: Your physician recommends that you return for lab work in: We will see if your PCP can add on Mg and TSH.  If you have labs (blood work) drawn today and your tests are completely normal, you will receive your results only by: Marland Kitchen MyChart Message (if you have MyChart) OR . A paper copy in the mail If you have any lab test that is abnormal or we need to change your treatment, we will call you to review the results.   Testing/Procedures: Your physician has requested that you have an echocardiogram. Echocardiography is a painless test that uses sound waves to create images of your heart. It provides your doctor with information about the size and shape of your heart and how well your heart's chambers and valves are working. This procedure takes approximately one hour. There are no restrictions for this procedure.   Echocardiogram An echocardiogram is a procedure that uses painless sound waves (ultrasound) to produce an image of the heart. Images from an echocardiogram can provide important information about:  Signs of coronary artery disease (CAD).  Aneurysm detection. An aneurysm is a weak or damaged part of an artery wall that bulges out from the normal force of blood pumping through the body.  Heart size and shape. Changes in the size or shape of the heart can be associated with certain conditions, including heart failure, aneurysm, and CAD.  Heart muscle function.  Heart valve function.  Signs of a past heart attack.  Fluid buildup around the heart.  Thickening of the heart muscle.  A tumor or infectious growth around the heart valves. Tell a health care provider about:  Any allergies you  have.  All medicines you are taking, including vitamins, herbs, eye drops, creams, and over-the-counter medicines.  Any blood disorders you have.  Any surgeries you have had.  Any medical conditions you have.  Whether you are pregnant or may be pregnant. What are the risks? Generally, this is a safe procedure. However, problems may occur, including:  Allergic reaction to dye (contrast) that may be used during the procedure. What happens before the procedure? No specific preparation is needed. You may eat and drink normally. What happens during the procedure?   An IV tube may be inserted into one of your veins.  You may receive contrast through this tube. A contrast is an injection that improves the quality of the pictures from your heart.  A gel will be applied to your chest.  A wand-like tool (transducer) will be moved over your chest. The gel will help to transmit the sound waves from the transducer.  The sound waves will harmlessly bounce off of your heart to allow the heart images to be captured in real-time motion. The images will be recorded on a computer. The procedure may vary among health care providers and hospitals. What happens after the procedure?  You may return to your normal, everyday life, including diet, activities, and medicines, unless your health care provider tells you not to do that. Summary  An echocardiogram is a procedure that uses painless sound waves (ultrasound) to produce an image of the heart.  Images from an echocardiogram can provide important information about the size and shape of your  heart, heart muscle function, heart valve function, and fluid buildup around your heart.  You do not need to do anything to prepare before this procedure. You may eat and drink normally.  After the echocardiogram is completed, you may return to your normal, everyday life, unless your health care provider tells you not to do that. This information is not  intended to replace advice given to you by your health care provider. Make sure you discuss any questions you have with your health care provider. Document Revised: 08/16/2018 Document Reviewed: 05/28/2016 Elsevier Patient Education  [place]: At [facility], you and your health needs are our priority.  As part of our continuing mission to provide you with exceptional heart care, we have created designated Provider Care Teams.  These Care Teams include your primary Cardiologist (physician) and Advanced Practice Providers (APPs -  Physician Assistants and Nurse Practitioners) who all work together to provide you with the care you need, when you need it.  We recommend signing up for the patient portal called "MyChart".  Sign up information is provided on this After Visit Summary.  MyChart is used to connect with patients for Virtual Visits (Telemedicine).  Patients are able to view lab/test results, encounter notes, upcoming appointments, etc.  Non-urgent messages can be sent to your provider as well.   To learn more about what you can do with MyChart, go to [email or web address].    Your next appointment:   1 month(s)  The format for your next appointment:   In Person or Virtual  Provider:    You may see Nelva Bush, MD or one of the following Advanced Practice Providers on your designated Care Team:    Murray Hodgkins, NP  Christell Faith, PA-C  Marrianne Mood, PA-C

## 2019-07-10 NOTE — Telephone Encounter (Signed)
New message:   Pt is calling and states the stuff she is supposed to drink may possibly give her diarrhea and she is allergic to sulfate. This drink is for a CT scan tomorrow. Please advise.

## 2019-07-10 NOTE — Telephone Encounter (Signed)

## 2019-07-11 ENCOUNTER — Encounter: Payer: Self-pay | Admitting: Internal Medicine

## 2019-07-11 ENCOUNTER — Other Ambulatory Visit (INDEPENDENT_AMBULATORY_CARE_PROVIDER_SITE_OTHER): Payer: 59

## 2019-07-11 ENCOUNTER — Encounter (HOSPITAL_BASED_OUTPATIENT_CLINIC_OR_DEPARTMENT_OTHER): Payer: Self-pay

## 2019-07-11 ENCOUNTER — Other Ambulatory Visit: Payer: Self-pay | Admitting: Internal Medicine

## 2019-07-11 ENCOUNTER — Ambulatory Visit (HOSPITAL_BASED_OUTPATIENT_CLINIC_OR_DEPARTMENT_OTHER)
Admission: RE | Admit: 2019-07-11 | Discharge: 2019-07-11 | Disposition: A | Discharge status: Home / Self Care | Payer: 59 | Source: Ambulatory Visit | Attending: Internal Medicine | Admitting: Internal Medicine

## 2019-07-11 ENCOUNTER — Telehealth: Payer: Self-pay | Admitting: *Deleted

## 2019-07-11 DIAGNOSIS — R1031 Right lower quadrant pain: Secondary | ICD-10-CM

## 2019-07-11 DIAGNOSIS — I499 Cardiac arrhythmia, unspecified: Secondary | ICD-10-CM | POA: Diagnosis not present

## 2019-07-11 DIAGNOSIS — E876 Hypokalemia: Secondary | ICD-10-CM

## 2019-07-11 DIAGNOSIS — R002 Palpitations: Secondary | ICD-10-CM

## 2019-07-11 LAB — MAGNESIUM: Magnesium: 2.3 mg/dL (ref 1.5–2.5)

## 2019-07-11 MED ORDER — POTASSIUM CHLORIDE CRYS ER 20 MEQ PO TBCR: 20.0000 meq | EXTENDED_RELEASE_TABLET | Freq: Every day | ORAL | 3 refills | Status: DC

## 2019-07-11 MED ORDER — IOHEXOL 300 MG/ML  SOLN
100.0000 mL | Freq: Once | INTRAMUSCULAR | Status: AC | PRN
  Administered 2019-07-11: 100 mL via INTRAVENOUS

## 2019-07-11 MED ORDER — SULFAMETHOXAZOLE-TRIMETHOPRIM 800-160 MG PO TABS: 1.0000 | ORAL_TABLET | Freq: Two times a day (BID) | ORAL | 0 refills | Status: DC

## 2019-07-11 NOTE — Telephone Encounter (Signed)
Pt has been informed and expressed.

## 2019-07-11 NOTE — Telephone Encounter (Signed)
She can take imodium with this but the scan is more helpful is she drinks the contrast. This does give a lot of people diarrhea as it does typically flush right through people. I would recommend to drink it. The sulfa allergy is not related to the contrast.

## 2019-07-11 NOTE — Telephone Encounter (Signed)
My Chart Message sent to patient with Dr Darnelle Bos recommendations. Rx sent to pharmacy and lab orders entered.

## 2019-07-11 NOTE — Telephone Encounter (Signed)
-----   Message from Nelva Bush, MD sent at 07/11/2019 12:00 PM EST ----- Please let Cassie [name] know that her magnesium level is normal.  Given her borderline low potassium on yesterday's BMP, I suggest that we start her on potassium chloride 20 mEq daily.  We should recheck a BMP in addition to TSH when she comes in for her echo.  Thanks.  [name] ----- Message ----- From: Vanessa Ralphs, RN Sent: 07/11/2019  11:39 AM EST To: Nelva Bush, MD  Lab is completed.

## 2019-07-12 ENCOUNTER — Encounter: Payer: Self-pay | Admitting: Internal Medicine

## 2019-07-12 ENCOUNTER — Ambulatory Visit (INDEPENDENT_AMBULATORY_CARE_PROVIDER_SITE_OTHER): Payer: 59 | Admitting: Internal Medicine

## 2019-07-12 DIAGNOSIS — M793 Panniculitis, unspecified: Secondary | ICD-10-CM | POA: Insufficient documentation

## 2019-07-12 DIAGNOSIS — R7989 Other specified abnormal findings of blood chemistry: Secondary | ICD-10-CM | POA: Diagnosis not present

## 2019-07-12 DIAGNOSIS — R600 Localized edema: Secondary | ICD-10-CM | POA: Diagnosis not present

## 2019-07-12 DIAGNOSIS — F418 Other specified anxiety disorders: Secondary | ICD-10-CM | POA: Diagnosis not present

## 2019-07-12 MED ORDER — CEPHALEXIN 500 MG PO CAPS: 500.0000 mg | ORAL_CAPSULE | Freq: Two times a day (BID) | ORAL | 0 refills | Status: DC

## 2019-07-12 NOTE — Progress Notes (Signed)
Virtual Visit via Video Note  I connected with Cassie [name] on 07/12/19 at  4:00 PM EST by a video enabled telemedicine application and verified that I am speaking with the correct person using two identifiers.  The patient and the provider were at separate locations throughout the entire encounter.   I discussed the limitations of evaluation and management by telemedicine and the availability of in person appointments. The patient expressed understanding and agreed to proceed. The patient and the provider were the only parties present for the visit unless noted in HPI below.  History of Present Illness: The patient is a 59 y.o. female with visit for follow up lower abdominal pain. Started getting better a couple days ago. She has questions about the CT scan and wants to discuss several things. Also has done well with keflex in the past and wants to know if we can change the bactrim to that or if this is even needed since the pain is improving.  Observations/Objective: Appearance: normal, breathing appears normal, casual grooming, abdomen does not appear distended, throat normal, memory normal, mental status is A and O times 3  Assessment and Plan: See problem oriented charting  Follow Up Instructions: questions answered and will send in keflex but she will hold off on taking unless worsening.  Visit time 20 minutes in face to face communication with patient and coordination of care, additional 10 minutes spent in record review, coordination or care, ordering tests, communicating/referring to other healthcare professionals, documenting in medical records all on the same day of the visit for total time 30 minutes spent on the visit.   I discussed the assessment and treatment plan with the patient. The patient was provided an opportunity to ask questions and all were answered. The patient agreed with the plan and demonstrated an understanding of the instructions.   The patient was advised to  call back or seek an in-person evaluation if the symptoms worsen or if the condition fails to improve as anticipated.  Hoyt Koch, MD

## 2019-07-12 NOTE — Assessment & Plan Note (Signed)
Unclear if this was just inflammation as pain is resolving. Will send in keflex to take if worsening but hold off for now.

## 2019-07-12 NOTE — Assessment & Plan Note (Signed)
Discussed incidental findings on CT abdomen and pelvis in relation to past studies as well.

## 2019-07-15 ENCOUNTER — Encounter: Payer: Self-pay | Admitting: Internal Medicine

## 2019-07-15 ENCOUNTER — Encounter: Payer: Self-pay | Admitting: Family

## 2019-07-16 ENCOUNTER — Other Ambulatory Visit: Payer: Self-pay | Admitting: Family

## 2019-07-16 ENCOUNTER — Other Ambulatory Visit: Payer: Self-pay | Admitting: Internal Medicine

## 2019-07-16 DIAGNOSIS — I82401 Acute embolism and thrombosis of unspecified deep veins of right lower extremity: Secondary | ICD-10-CM

## 2019-07-16 DIAGNOSIS — Z86711 Personal history of pulmonary embolism: Secondary | ICD-10-CM

## 2019-07-18 ENCOUNTER — Encounter: Payer: Self-pay | Admitting: Internal Medicine

## 2019-07-18 ENCOUNTER — Encounter: Payer: Self-pay | Admitting: Family

## 2019-07-18 ENCOUNTER — Telehealth: Payer: 59 | Admitting: Internal Medicine

## 2019-07-30 ENCOUNTER — Other Ambulatory Visit: Payer: Self-pay

## 2019-07-30 ENCOUNTER — Other Ambulatory Visit (INDEPENDENT_AMBULATORY_CARE_PROVIDER_SITE_OTHER): Payer: 59

## 2019-07-30 DIAGNOSIS — Z86711 Personal history of pulmonary embolism: Secondary | ICD-10-CM

## 2019-07-30 DIAGNOSIS — R7989 Other specified abnormal findings of blood chemistry: Secondary | ICD-10-CM | POA: Diagnosis not present

## 2019-07-30 LAB — COMPREHENSIVE METABOLIC PANEL
ALT: 36 U/L — ABNORMAL HIGH (ref 0–35)
AST: 23 U/L (ref 0–37)
Albumin: 3.6 g/dL (ref 3.5–5.2)
Alkaline Phosphatase: 62 U/L (ref 39–117)
BUN: 10 mg/dL (ref 6–23)
CO2: 27 mEq/L (ref 19–32)
Calcium: 8.9 mg/dL (ref 8.4–10.5)
Chloride: 107 mEq/L (ref 96–112)
Creatinine, Ser: 0.87 mg/dL (ref 0.40–1.20)
GFR: 66.34 mL/min (ref >60.00)
Glucose, Bld: 108 mg/dL — ABNORMAL HIGH (ref 70–99)
Potassium: 4.3 mEq/L (ref 3.5–5.1)
Sodium: 138 mEq/L (ref 135–145)
Total Bilirubin: 0.3 mg/dL (ref 0.2–1.2)
Total Protein: 6.9 g/dL (ref 6.0–8.3)

## 2019-07-30 LAB — CBC WITH DIFFERENTIAL/PLATELET
Basophils Absolute: 0 10*3/uL (ref 0.0–0.1)
Basophils Relative: 0.5 % (ref 0.0–3.0)
Eosinophils Absolute: 0.2 10*3/uL (ref 0.0–0.7)
Eosinophils Relative: 1.9 % (ref 0.0–5.0)
HCT: 40.5 % (ref 36.0–46.0)
Hemoglobin: 13.2 g/dL (ref 12.0–15.0)
Lymphocytes Relative: 20.9 % (ref 12.0–46.0)
Lymphs Abs: 1.6 10*3/uL (ref 0.7–4.0)
MCHC: 32.6 g/dL (ref 30.0–36.0)
MCV: 95.7 fl (ref 78.0–100.0)
Monocytes Absolute: 0.6 10*3/uL (ref 0.1–1.0)
Monocytes Relative: 7.4 % (ref 3.0–12.0)
Neutro Abs: 5.4 10*3/uL (ref 1.4–7.7)
Neutrophils Relative %: 69.3 % (ref 43.0–77.0)
Platelets: 248 10*3/uL (ref 150.0–400.0)
RBC: 4.23 Mil/uL (ref 3.87–5.11)
RDW: 15.4 % (ref 11.5–15.5)
WBC: 7.9 10*3/uL (ref 4.0–10.5)

## 2019-07-30 LAB — TSH: TSH: 3.91 u[IU]/mL (ref 0.35–4.50)

## 2019-07-30 LAB — HM DEXA SCAN

## 2019-07-30 LAB — MAGNESIUM: Magnesium: 2.1 mg/dL (ref 1.5–2.5)

## 2019-07-31 LAB — D-DIMER, QUANTITATIVE: D-Dimer, Quant: 0.26 mcg/mL FEU (ref <0.50)

## 2019-08-05 ENCOUNTER — Encounter: Payer: Self-pay | Admitting: Internal Medicine

## 2019-08-05 ENCOUNTER — Other Ambulatory Visit: Payer: 59

## 2019-08-05 ENCOUNTER — Telehealth: Payer: Self-pay | Admitting: Family

## 2019-08-05 ENCOUNTER — Ambulatory Visit: Payer: 59 | Attending: Internal Medicine

## 2019-08-05 ENCOUNTER — Inpatient Hospital Stay: Payer: 59 | Attending: Hematology & Oncology | Admitting: Family

## 2019-08-05 DIAGNOSIS — Z23 Encounter for immunization: Secondary | ICD-10-CM

## 2019-08-05 DIAGNOSIS — Z86711 Personal history of pulmonary embolism: Secondary | ICD-10-CM | POA: Diagnosis not present

## 2019-08-05 DIAGNOSIS — I82401 Acute embolism and thrombosis of unspecified deep veins of right lower extremity: Secondary | ICD-10-CM

## 2019-08-05 NOTE — Progress Notes (Signed)
   Covid-19 Vaccination Clinic  Name:  TABIA LANDOWSKI    MRN: [id number] DOB: Feb 28, 1961  08/05/2019  Ms. Shannahan was observed post Covid-19 immunization for 15 minutes without incident. She was provided with Vaccine Information Sheet and instruction to access the V-Safe system.   Ms. Eads was instructed to call 911 with any severe reactions post vaccine: Marland Kitchen Difficulty breathing  . Swelling of face and throat  . A fast heartbeat  . A bad rash all over body  . Dizziness and weakness   Immunizations Administered    Name Date Dose VIS Date Route   Pfizer COVID-19 Vaccine 08/05/2019 10:29 AM 0.3 mL 04/19/2019 Intramuscular   Manufacturer: [place]   Lot: [id number]   [place]: [id number]

## 2019-08-05 NOTE — Progress Notes (Signed)
Hematology and Oncology Follow Up Visit  Cassie [name] [id number] September 02, 1960 59 y.o. 08/05/2019   Principle Diagnosis:  Idiopathic right lower lobe pulmonary embolism Ulcerative colitis  Current Therapy:        ELIQUIS 5 mg by mouth twice a day   Interim History:  Cassie [name] had a telephone visit today. She has had her first Covid vaccine but is still not comfortable going out in crowds.  She is doing well and has no new complaints at this time.  She still has fatigued and some SOB with activity and is unsure if this is related to deconditioning. She has noted this for the last 4 years and so far her work-up has been negative.  She is doing well on Eliquis BID. No episodes of bleeding. No bruising or petechiae.  She has occasional palpitations that come and go quickly and without intervention.  D-dimer was stable at 0.26.  No fever, chills, n/v, cough, rash, dizziness, chest pain, abdominal pain or changes in bowel or bladder habits.  She has occasional bouts of colitis. She is careful about what she eats and does her best to hydrate properly.  She has chronic swelling in her ankles and is considering seeing a vascular specialist once she is fully vaccinated. Her mother and sister both have varicose veins and she is concerned she may have some circulation issues.  No numbness or tingling in her extremities at this time.  No falls or syncopal episodes to report.   ECOG Performance Status: 1 - Symptomatic but completely ambulatory  Medications:  Allergies as of 08/05/2019      Reactions   Doxycycline Palpitations   Hydrocodone Other (See Comments)   Hallucinations    Sudafed [pseudoephedrine Hcl] Shortness Of Breath   Acular [ketorolac Tromethamine]    Unknown reaction   Diphenhydramine Hcl Swelling   Throat feels like it swells    Lactose Intolerance (gi) Diarrhea, Other (See Comments)   bloating   Mesalamine Other (See Comments)   Hair loss   Sulfasalazine Diarrhea   Azithromycin Palpitations   Speeding heart rate per pt.   Caffeine Palpitations   Penicillins Rash   Has patient had a PCN reaction causing immediate rash, facial/tongue/throat swelling, SOB or lightheadedness with hypotension: Yes Has patient had a PCN reaction causing severe rash involving mucus membranes or skin necrosis: No Has patient had a PCN reaction that required hospitalization Yes Has patient had a PCN reaction occurring within the last 10 years: No If all of the above answers are "NO", then may proceed with Cephalosporin use.   Prednisone Palpitations      Medication List       Accurate as of August 05, 2019  2:02 PM. If you have any questions, ask your nurse or doctor.        acetaminophen 325 MG tablet Commonly known as: TYLENOL Take 650 mg by mouth every 6 (six) hours as needed.   ALPRAZolam 1 MG tablet Commonly known as: XANAX TAKE 0.5-1 TABLETS (0.5-1 MG TOTAL) BY MOUTH 2 (TWO) TIMES DAILY AS NEEDED FOR ANXIETY.   atenolol 25 MG tablet Commonly known as: TENORMIN Take 0.5 tablets (12.5 mg total) by mouth 2 (two) times daily.   cephALEXin 500 MG capsule Commonly known as: KEFLEX Take 1 capsule (500 mg total) by mouth 2 (two) times daily.   Eliquis 5 MG Tabs tablet Generic drug: apixaban TAKE 1 TABLET BY MOUTH TWICE DAILY   GAS-X PO Take 1 tablet by mouth 4 (  four) times daily as needed (For gas.). Do not exceed 6 tablets in a 24 hour period.   Imodium A-D 2 MG tablet Generic drug: loperamide Take 2 mg by mouth 4 (four) times daily as needed for diarrhea or loose stools.   norethindrone 5 MG tablet Commonly known as: AYGESTIN Take 2 tablets (10 mg total) by mouth daily.   potassium chloride SA 20 MEQ tablet Commonly known as: KLOR-CON Take 1 tablet (20 mEq total) by mouth daily.   PROBIOTIC DAILY PO Take 2 capsules by mouth daily with lunch.   SYSTANE OP Place 1 drop into both eyes daily as needed (dry eyes).   vitamin B-12 500 MCG  tablet Commonly known as: CYANOCOBALAMIN Take 500 mcg by mouth as needed.   VITAMIN C PO Take by mouth daily as needed.   Vitamin D3 50 MCG (2000 UT) capsule Take 2,000 Units by mouth as needed.       Allergies:  Allergies  Allergen Reactions  . Doxycycline Palpitations  . Hydrocodone Other (See Comments)    Hallucinations   . Sudafed [Pseudoephedrine Hcl] Shortness Of Breath  . Acular [Ketorolac Tromethamine]     Unknown reaction   . Diphenhydramine Hcl Swelling    Throat feels like it swells   . Lactose Intolerance (Gi) Diarrhea and Other (See Comments)    bloating  . Mesalamine Other (See Comments)    Hair loss  . Sulfasalazine Diarrhea  . Azithromycin Palpitations    Speeding heart rate per pt.   . Caffeine Palpitations  . Penicillins Rash    Has patient had a PCN reaction causing immediate rash, facial/tongue/throat swelling, SOB or lightheadedness with hypotension: Yes Has patient had a PCN reaction causing severe rash involving mucus membranes or skin necrosis: No Has patient had a PCN reaction that required hospitalization Yes Has patient had a PCN reaction occurring within the last 10 years: No If all of the above answers are "NO", then may proceed with Cephalosporin use.  . Prednisone Palpitations    Past Medical History, Surgical history, Social history, and Family History were reviewed and updated.  Review of Systems: All other 10 point review of systems is negative.   Physical Exam:  vitals were not taken for this visit.   Wt Readings from Last 3 Encounters:  07/10/19 (!) 324 lb (147 kg)  06/20/19 (!) 324 lb (147 kg)  05/29/19 (!) 326 lb (147.9 kg)    Lab Results  Component Value Date   WBC 7.9 07/30/2019   HGB 13.2 07/30/2019   HCT 40.5 07/30/2019   MCV 95.7 07/30/2019   PLT 248.0 07/30/2019   Lab Results  Component Value Date   IRON 72 07/17/2013   IRONPCTSAT 24.5 07/17/2013   Lab Results  Component Value Date   RBC 4.23  07/30/2019   No results found for: KPAFRELGTCHN, LAMBDASER, [facility] Lab Results  Component Value Date   IGA 341 05/25/2015   No results found for: Odetta Pink, SPEI   Chemistry      Component Value Date/Time   NA 138 07/30/2019 0804   NA 139 01/24/2017 0929   K 4.3 07/30/2019 0804   K 4.0 01/24/2017 0929   CL 107 07/30/2019 0804   CL 103 10/10/2016 1403   CL 105 09/09/2016 1332   CO2 27 07/30/2019 0804   CO2 24 01/24/2017 0929   BUN 10 07/30/2019 0804   BUN 9.5 01/24/2017 0929   CREATININE 0.87 07/30/2019 0804  CREATININE 0.86 01/07/2019 0749   CREATININE 0.8 01/24/2017 0929      Component Value Date/Time   CALCIUM 8.9 07/30/2019 0804   CALCIUM 9.7 01/24/2017 0929   ALKPHOS 62 07/30/2019 0804   ALKPHOS 107 01/24/2017 0929   AST 23 07/30/2019 0804   AST 12 (L) 01/07/2019 0749   AST 18 01/24/2017 0929   ALT 36 (H) 07/30/2019 0804   ALT 13 01/07/2019 0749   ALT 16 01/24/2017 0929   BILITOT 0.3 07/30/2019 0804   BILITOT 0.4 01/07/2019 0749   BILITOT 0.31 01/24/2017 0929       Impression and Plan: Cassie [name] is a pleasant 59 yo caucasian female with history of PE.  She continues to do well on Eliquis 5 mg PO BID.  She will continue her same regimen and we will plan to see her back in another 6 months.  She will contact our office with any questions or concerns. We can certainly see her sooner if needed.   Laverna Peace, NP 3/29/20212:02 PM

## 2019-08-05 NOTE — Telephone Encounter (Signed)
Called and advised patient of appointment added.  She has requested early AM appt since she will be coming into the office for this visit.per 3/29 los

## 2019-08-06 ENCOUNTER — Telehealth: Payer: Self-pay

## 2019-08-06 NOTE — Telephone Encounter (Signed)
Spoke with patient regarding normal bone density results. Patient verbalizes understanding and agreeable to plan of 4-5 year recheck. Closing encounter.

## 2019-08-16 ENCOUNTER — Other Ambulatory Visit: Payer: Self-pay

## 2019-08-16 ENCOUNTER — Ambulatory Visit (INDEPENDENT_AMBULATORY_CARE_PROVIDER_SITE_OTHER): Payer: 59

## 2019-08-16 DIAGNOSIS — I471 Supraventricular tachycardia: Secondary | ICD-10-CM | POA: Diagnosis not present

## 2019-08-16 MED ORDER — PERFLUTREN LIPID MICROSPHERE
1.0000 mL | INTRAVENOUS | Status: AC | PRN
  Administered 2019-08-16: 2 mL via INTRAVENOUS

## 2019-08-19 ENCOUNTER — Encounter: Payer: Self-pay | Admitting: Internal Medicine

## 2019-08-19 NOTE — Telephone Encounter (Signed)
Pt sent mychart msg stating to disregard.. her GYN called her concerning bone density

## 2019-08-21 NOTE — Progress Notes (Signed)
Virtual Visit via Video Note   This visit type was conducted due to national recommendations for restrictions regarding the COVID-19 Pandemic (e.g. social distancing) in an effort to limit this patient's exposure and mitigate transmission in our community.  Due to her co-morbid illnesses, this patient is at least at moderate risk for complications without adequate follow up.  This format is felt to be most appropriate for this patient at this time.  All issues noted in this document were discussed and addressed.  A limited physical exam was performed with this format.  Please refer to the patient's chart for her consent to telehealth for [facility].   The patient was identified using 2 identifiers.  Date:  08/22/2019   ID:  Cassie [name], DOB 08-09-1960, MRN [id number]  Patient Location: Home Provider Location: Office  PCP:  Hoyt Koch, MD  Cardiologist:  Nelva Bush, MD  Electrophysiologist:  None   Evaluation Performed:  Follow-Up Visit  Chief Complaint:  Follow-up palpitations and shortness of breath  History of Present Illness:    Cassie [name] is a 59 y.o. female with history of palpitations with brief PSVT and PVCs, chest pain with myocardial bridging in the mid LAD, pulmonary embolism on chronic anticoagulation with apixaban, ulcerative colitis, vitamin B12 deficiency, vitamin D deficiency, anxiety, and morbid obesity.  We last spoke a month ago, at which time Cassie [name] continued to have intermittent palpitations and exertional dyspnea and subsequent echo showed normal LVEF with grade 1 diastolic dysfunction and mild left atrial enlargement.  No significant valvular abnormality was seen.  Today, Cassie [name] reports that her palpitations have improved since she switched from one atenolol manufacturer to another.  Her resting heart rate has also improved from the upper 40's to mid to upper 50's.  She has not had any chest pain but feels like her exertional  dyspnea is a little worse.  She wonders if this could be related to progesterone treatment, as she has also been having frequent headaches since starting progesterone.  Her mobility remains quite limited due to chronic back pain.  She is trying to alter her diet to lose weight.  Dependent leg edema is unchanged and well-controlled with regular leg elevation.   Past Medical History:  Diagnosis Date  . Adrenal nodule ([place])   . Allergy   . Anxiety   . Arthritis    knees,thumbs  . Cardiac arrhythmia    palpitations,PVC'sAC  . Cataract    bilateral removed   . Cyst of kidney, acquired    cyst right kidney- benign  . Degenerative disc disease, lumbar   . Edema   . Gallstones   . History of anemia    20 yrs. ago not now- pt denies   . Morbid obesity ([place])   . Osteoarthritis   . Other allergy, other than to medicinal agents   . Panic disorder   . Pulmonary embolism ([place])   . Run of atrial premature complexes   . Ulcerative colitis, unspecified   . Vitamin B12 deficiency   . Vitamin D deficiency    Past Surgical History:  Procedure Laterality Date  . BREAST BIOPSY  11/2011   Left- benign  . CATARACT EXTRACTION Right   . CATARACT EXTRACTION Left   . CHOLECYSTECTOMY N/A 01/17/2014   Procedure: LAPAROSCOPIC CHOLECYSTECTOMY;  Surgeon: Excell Seltzer, MD;  Location: [facility];  Service: General;  Laterality: N/A;  . COLONOSCOPY    . RETINAL LASER PROCEDURE Left   .  SIGMOIDOSCOPY       Current Meds  Medication Sig  . acetaminophen (TYLENOL) 325 MG tablet Take 650 mg by mouth every 6 (six) hours as needed.  . ALPRAZolam (XANAX) 1 MG tablet TAKE 0.5-1 TABLETS (0.5-1 MG TOTAL) BY MOUTH 2 (TWO) TIMES DAILY AS NEEDED FOR ANXIETY.  . Ascorbic Acid (VITAMIN C PO) Take by mouth daily as needed.  Marland Kitchen atenolol (TENORMIN) 25 MG tablet Take 0.5 tablets (12.5 mg total) by mouth 2 (two) times daily.  . Cholecalciferol (VITAMIN D3) 2000 units capsule Take 2,000 Units by mouth as needed.   Marland Kitchen ELIQUIS  5 MG TABS tablet TAKE 1 TABLET BY MOUTH TWICE DAILY  . loperamide (IMODIUM A-D) 2 MG tablet Take 2 mg by mouth 4 (four) times daily as needed for diarrhea or loose stools.  . norethindrone (AYGESTIN) 5 MG tablet Take 2 tablets (10 mg total) by mouth daily.  Vladimir Faster Glycol-Propyl Glycol (SYSTANE OP) Place 1 drop into both eyes daily as needed (dry eyes).  . Probiotic Product (PROBIOTIC DAILY PO) Take 2 capsules by mouth daily with lunch.   . Simethicone (GAS-X PO) Take 1 tablet by mouth 4 (four) times daily as needed (For gas.). Do not exceed 6 tablets in a 24 hour period.  . vitamin B-12 (CYANOCOBALAMIN) 500 MCG tablet Take 500 mcg by mouth as needed.      Allergies:   Doxycycline, Hydrocodone, Sudafed [pseudoephedrine hcl], Acular [ketorolac tromethamine], Diphenhydramine hcl, Lactose intolerance (gi), Mesalamine, Sulfasalazine, Azithromycin, Caffeine, Penicillins, and Prednisone   Social History   Tobacco Use  . Smoking status: Former Smoker    Packs/day: 2.00    Years: 12.00    Pack years: 24.00    Types: Cigarettes    Quit date: 03/07/1979    Years since quitting: 40.4  . Smokeless tobacco: Never Used  Substance Use Topics  . Alcohol use: No    Alcohol/week: 0.0 standard drinks  . Drug use: No     Family Hx: The patient's family history includes Atrial fibrillation in her father; Heart disease (age of onset: 65) in her father; Hypertension in her maternal aunt and maternal grandfather. There is no history of Colon cancer, Stomach cancer, Colon polyps, Esophageal cancer, or Rectal cancer.  ROS:   Please see the history of present illness.    All other systems reviewed and are negative.   Prior CV studies:   The following studies were reviewed today:  TTE (08/16/2019): Normal LV size and wall thickness.  LVEF 60-65% with grade 1 diastolic dysfunction.  Normal RV size and function.  Mild left ventricular enlargement.  No significant valvular abnormality.  14-day event  monitor (06/02/19): Predominantly sinus rhythm with rare PACs and PVCs as well as brief PSVT and a single 5 beat run of NSVT followed by transient accelerated idioventricular rhythm.  Labs/Other Tests and Data Reviewed:    EKG:  No ECG reviewed.  Recent Labs: 07/30/2019: ALT 36; BUN 10; Creatinine, Ser 0.87; Hemoglobin 13.2; Magnesium 2.1; Platelets 248.0; Potassium 4.3; Sodium 138; TSH 3.91   Recent Lipid Panel Lab Results  Component Value Date/Time   CHOL 149 06/14/2018 09:47 AM   TRIG 87.0 06/14/2018 09:47 AM   HDL 33.20 (L) 06/14/2018 09:47 AM   CHOLHDL 4 06/14/2018 09:47 AM   LDLCALC 98 06/14/2018 09:47 AM    Wt Readings from Last 3 Encounters:  08/22/19 (!) 326 lb (147.9 kg)  07/10/19 (!) 324 lb (147 kg)  06/20/19 (!) 324 lb (147 kg)  Objective:    Vital Signs:  BP 122/64 (BP Location: Right Arm, Patient Position: Sitting, Cuff Size: Normal)   Pulse (!) 56   Ht 5' 5"  (1.651 m)   Wt (!) 326 lb (147.9 kg)   SpO2 98%   BMI 54.25 kg/m    VITAL SIGNS:  reviewed GEN:  no acute distress  ASSESSMENT & PLAN:    Palpitations with paroxysmal atrial tachycardia: Palpitations have improved since switching from one atenolol manufacturer to another.  I will defer to Cassie [name] and her pharmacist regarding which supplier to use.  Switching to an alternate beta blocker would be another option, though Cassie [name] would like to defer this for now.  We will not pursue EP consultation at this time, given improved symptoms.  Dyspnea on exertion: This has worsened slightly since our last visit.  Recent echo was reassuring with normal LVEF.  Grade 1 diastolic may be contributing, though I suspect dyspnea is primarily driven by morbid obesity and deconditioning.  I have encouraged Cassie [name] to work on weight loss and increasing her activity, as tolerated.  Coronary artery calcification and aortic atherosclerosis: Noted on prior cross-sectional imaging of the chest and abdomen.  We  discussed addition of statin therapy, though last LDL in 06/2018 was reasonable at 98.  In addition, mildly elevated ALT was recently noted; we will therefore defer adding a statin and plan to recheck a fasting lipid panel at Cassie [name]'s convenience.  Morbid obesity: BMI remains > 50.  Weight loss encouraged through diet and activity.  Time:   Today, I have spent 27 minutes with the patient with telehealth technology discussing the above problems.     Medication Adjustments/Labs and Tests Ordered: Current medicines are reviewed at length with the patient today.  Concerns regarding medicines are outlined above.   Tests Ordered: None.  Medication Changes: None.  Follow Up:  In Person in 6 month(s)  Signed, Nelva Bush, MD  08/22/2019 9:21 AM    [place]

## 2019-08-22 ENCOUNTER — Encounter: Payer: Self-pay | Admitting: Obstetrics & Gynecology

## 2019-08-22 ENCOUNTER — Telehealth: Payer: Self-pay | Admitting: Obstetrics & Gynecology

## 2019-08-22 ENCOUNTER — Encounter: Payer: Self-pay | Admitting: Internal Medicine

## 2019-08-22 ENCOUNTER — Telehealth (INDEPENDENT_AMBULATORY_CARE_PROVIDER_SITE_OTHER): Payer: 59 | Admitting: Internal Medicine

## 2019-08-22 ENCOUNTER — Other Ambulatory Visit: Payer: Self-pay

## 2019-08-22 VITALS — BP 122/64 | HR 56 | Ht 65.0 in | Wt 326.0 lb

## 2019-08-22 DIAGNOSIS — I2510 Atherosclerotic heart disease of native coronary artery without angina pectoris: Secondary | ICD-10-CM

## 2019-08-22 DIAGNOSIS — R0600 Dyspnea, unspecified: Secondary | ICD-10-CM | POA: Diagnosis not present

## 2019-08-22 DIAGNOSIS — I471 Supraventricular tachycardia: Secondary | ICD-10-CM

## 2019-08-22 DIAGNOSIS — N8501 Benign endometrial hyperplasia: Secondary | ICD-10-CM

## 2019-08-22 DIAGNOSIS — R002 Palpitations: Secondary | ICD-10-CM

## 2019-08-22 DIAGNOSIS — N950 Postmenopausal bleeding: Secondary | ICD-10-CM

## 2019-08-22 DIAGNOSIS — I700 Atherosclerosis of aorta: Secondary | ICD-10-CM

## 2019-08-22 DIAGNOSIS — I2584 Coronary atherosclerosis due to calcified coronary lesion: Secondary | ICD-10-CM

## 2019-08-22 DIAGNOSIS — R0609 Other forms of dyspnea: Secondary | ICD-10-CM

## 2019-08-22 NOTE — Patient Instructions (Signed)
Medication Instructions:  Your physician recommends that you continue on your current medications as directed. Please refer to the Current Medication list given to you today.  *If you need a refill on your cardiac medications before your next appointment, please call your pharmacy*   Lab Work: Your physician recommends that you return for lab work in: at your convenience to check your cholesterol LIPIDs. - You will need to be fasting. Please do not have anything to eat or drink after midnight the morning you have the lab work. You may only have water or black coffee with no cream or sugar. - You may call [facility] office to schedule an appointment at [phone].  If you have labs (blood work) drawn today and your tests are completely normal, you will receive your results only by: Marland Kitchen MyChart Message (if you have MyChart) OR . A paper copy in the mail If you have any lab test that is abnormal or we need to change your treatment, we will call you to review the results.   Testing/Procedures: none   Follow-Up: At [facility], you and your health needs are our priority.  As part of our continuing mission to provide you with exceptional heart care, we have created designated Provider Care Teams.  These Care Teams include your primary Cardiologist (physician) and Advanced Practice Providers (APPs -  Physician Assistants and Nurse Practitioners) who all work together to provide you with the care you need, when you need it.  We recommend signing up for the patient portal called "MyChart".  Sign up information is provided on this After Visit Summary.  MyChart is used to connect with patients for Virtual Visits (Telemedicine).  Patients are able to view lab/test results, encounter notes, upcoming appointments, etc.  Non-urgent messages can be sent to your provider as well.   To learn more about what you can do with MyChart, go to [email or web address].    Your next appointment:   6  month(s)  The format for your next appointment:   In Person  Provider:    You may see Nelva Bush, MD or one of the following Advanced Practice Providers on your designated Care Team:    Murray Hodgkins, NP  Christell Faith, PA-C  Marrianne Mood, PA-C

## 2019-08-22 NOTE — Telephone Encounter (Signed)
Cassie, [name] [facility]  Phone Number: [phone]  Hi Dr. Sabra Heck, hope you are doing well. It is time to schedule my May appointment with you. I would like to get your thoughts on something, please. When I initially went to [facility] back in November 2020 with the bleeding incident, they did external and internal ultrasounds. This is how they discovered and diagnosed the thickness of my uterine lining. I remember that I had to wait in the office a while for the Dr. to come back with the results. She determined the lining was slightly thicker than it should have been. She then took the biopsy to see if there were any pre cancerous or cancerous cells present. The lab results showed simple dysplasia with no pre or cancerous cells. I know you and I discussed this also. And I do want to thank you again for taking time to explain it better to me and answer my questions and concerns. I believe I am remembering correctly that we discussed simple dysplasia and treatment options, and how some women were not even treated for it at all. I get so anxious and almost physically ill when thinking of another biopsy. Even worse as May approaches. I want to ask something that my thoughts have been going to, and I fully trust and will respect your response to this. Since the thickening was determined through the internal [place], and was able to be measured that way, couldn't this be repeated, and show if the lining is thinning? Since we know it was simple dysplasia and there were no pre or cancerous cells, and I think I understood it would be rare that it would become complex, would we absolutely need repeated biopsies in May and then again 6 months later? Unless the [place] would show something different? Would it be possible to repeat the [place] this visit and if things are headed in the right direction, repeat it again in 6 months, and hold off on the biopsy until November, a year from the last one? I ask  because yes, I really cannot stand the thought of repeated biopsies. I will though of course follow your advice. I have had no bleeding, the discharge is heavier at sometimes and has a slight yellow tinge. I am having some side effects from the progesterone that I want to talk with you about. Could you please give me your opinion on my train of thought in this matter? I hope my attempt at logic makes sense...lol. I look forward to your reply and then will schedule whatever kind of appointment you want me to have for May. Thank you. Stay blessed.

## 2019-08-22 NOTE — Telephone Encounter (Signed)
Spoke with pt. New GYN OV 2/11  Pt concerned with having follow up EMB in May per Dr Sabra Heck recommendations at 06/20/19 OV. Pt had EMB with [facility] in Dec 2020 that showed in simple hyperplasia.   Pt is asking if she may have PUS at 3 month follow up in May instead of EMB, then repeat EMB in 6 months. Pt states taking progesterone daily as prescribed. States having no bleeding or spotting since OBGYN visit in Dec 2020. Advised pt follow up  EMB is recommended to evaluate the status of endometrial cell changes. Advised pt that PUS is to not able to evaluate these changes. Pt verbalized understanding, but would like to review this with Dr Sabra Heck as she does not desire another EMB in May.  Advised will review with Dr Sabra Heck and return call to pt with recommendations. Pt agreeable.   Routing to Dr Sabra Heck.

## 2019-08-23 NOTE — Telephone Encounter (Signed)
The recommendations for follow up endometrial biopsy in PMP female with simple endometrial hyperplasia without atypia is a repeat biopsy every 3-6 months until normal pathology.  Her biopsy was in December so biopsy anytime after June would be outside the standard of care.    She, personally, can decline anything but this is not what I recommend.  Ultrasound does help in this situation because we need pathology to prove the cells are normalizing.

## 2019-08-26 NOTE — Telephone Encounter (Signed)
Spoke with pt. Pt given recommendations per Dr Sabra Heck for repeat EMB. Pt agreeable and will schedule EMB.   Pt scheduled for EMB on 09/26/19 at 8am with Dr Sabra Heck.   Pt also wants to discuss progesterone and having sx of headaches and night sweats with insomnia. Pt has taken Tylenol for headaches, but no had any relief. Pt not on HRT. Started Aygestin Rx on 06/20/19 per Dr Sabra Heck.  Advised for OV for headaches. Pt declines due to husband's work schedule and having to bring her.   Pt declines earlier appt for EMB as well.  Pt states is wheelchair bound due to degenerative disc disease and will need procedure room for mobility. Both scheduled on 09/26/19. Pt aware of call for benefits.   Routing to Dr Sabra Heck for review.  Encounter closed.  Cc: Alfonse Spruce for precert. Orders placed.

## 2019-08-28 ENCOUNTER — Ambulatory Visit: Payer: 59 | Attending: Internal Medicine

## 2019-08-28 DIAGNOSIS — Z23 Encounter for immunization: Secondary | ICD-10-CM

## 2019-08-28 NOTE — Progress Notes (Signed)
   Covid-19 Vaccination Clinic  Name:  DEVIN FOSKEY    MRN: [id number] DOB: Oct 17, 1960  08/28/2019  Ms. Rothenberger was observed post Covid-19 immunization for 15 minutes without incident. She was provided with Vaccine Information Sheet and instruction to access the V-Safe system.   Ms. Forgey was instructed to call 911 with any severe reactions post vaccine: Marland Kitchen Difficulty breathing  . Swelling of face and throat  . A fast heartbeat  . A bad rash all over body  . Dizziness and weakness   Immunizations Administered    Name Date Dose VIS Date Route   Pfizer COVID-19 Vaccine 08/28/2019  9:55 AM 0.3 mL 07/03/2018 Intramuscular   Manufacturer: [place]   Lot: [id number]   [place]: [id number]

## 2019-08-30 ENCOUNTER — Other Ambulatory Visit: Payer: Self-pay | Admitting: Internal Medicine

## 2019-08-30 NOTE — Telephone Encounter (Signed)
Check [place] registry last filled 07/13/2019../l;mb

## 2019-09-03 ENCOUNTER — Other Ambulatory Visit: Payer: Self-pay | Admitting: Hematology & Oncology

## 2019-09-14 ENCOUNTER — Other Ambulatory Visit: Payer: Self-pay | Admitting: Obstetrics & Gynecology

## 2019-09-14 ENCOUNTER — Encounter: Payer: Self-pay | Admitting: Obstetrics & Gynecology

## 2019-09-14 ENCOUNTER — Encounter: Payer: Self-pay | Admitting: Internal Medicine

## 2019-09-16 ENCOUNTER — Other Ambulatory Visit: Payer: Self-pay | Admitting: Obstetrics & Gynecology

## 2019-09-16 ENCOUNTER — Telehealth: Payer: Self-pay | Admitting: Obstetrics & Gynecology

## 2019-09-16 NOTE — Telephone Encounter (Signed)
Margarit, [facility]  Phone Number: [phone]  Hi Dr. Sabra Heck, I need refills for the norethindrone 11m. For some reason my pharmacy couldn't get a fax to go through to you? Could you please do the refills for a 30 day supply? It is much more cost effective. I know we did 15 at first, but 30 would be great.  [place]  [phone] Thanks

## 2019-09-16 NOTE — Telephone Encounter (Signed)
This has been completed.  Thanks.

## 2019-09-16 NOTE — Telephone Encounter (Signed)
Medication refill request: Aygestin Last AEX:  06-20-19 SM  Next AEX: not scheduled  Last MMG (if hormonal medication request): 06/14/18 BIRADS 2 benign/density a Refill authorized: Today, please advise.   Medication pended for #60, 5RF. See telephone encounter dated 09-16-19, patient requesting 30 day supply for cost.P Please refill if appropriate.

## 2019-09-16 NOTE — Telephone Encounter (Signed)
See refill encounter dated 09-14-19. Request routed to Dr. Sabra Heck.

## 2019-09-24 ENCOUNTER — Telehealth: Payer: Self-pay

## 2019-09-24 NOTE — Telephone Encounter (Signed)
Spoke with pt. Pt rescheduled  EMB on 10/08/19 at 8 am. Pt verbalized understanding of date and time of appt. Advised will update Dr Sabra Heck.   Routing to Dr Sabra Heck for update.  Encounter closed.  Benefits and precert done.

## 2019-09-24 NOTE — Telephone Encounter (Signed)
Patient called to cancel and reschedule procedure appointment for EMB on (09/26/19). Patient canceled due to family emergency and patient apologizes. Patient would like to go ahead and reschedule and prefers an early morning appt if possible. Patient states if not reached nurse can leave a detailed message or mychart message.

## 2019-09-26 ENCOUNTER — Ambulatory Visit: Payer: Self-pay | Admitting: Obstetrics & Gynecology

## 2019-09-26 ENCOUNTER — Ambulatory Visit: Payer: Self-pay

## 2019-09-26 ENCOUNTER — Ambulatory Visit: Payer: 59 | Admitting: Obstetrics & Gynecology

## 2019-10-04 ENCOUNTER — Other Ambulatory Visit: Payer: Self-pay

## 2019-10-08 ENCOUNTER — Ambulatory Visit (INDEPENDENT_AMBULATORY_CARE_PROVIDER_SITE_OTHER): Payer: 59 | Admitting: Obstetrics & Gynecology

## 2019-10-08 ENCOUNTER — Encounter: Payer: Self-pay | Admitting: Obstetrics & Gynecology

## 2019-10-08 ENCOUNTER — Other Ambulatory Visit: Payer: Self-pay

## 2019-10-08 ENCOUNTER — Telehealth: Payer: Self-pay

## 2019-10-08 DIAGNOSIS — N950 Postmenopausal bleeding: Secondary | ICD-10-CM

## 2019-10-08 DIAGNOSIS — N8501 Benign endometrial hyperplasia: Secondary | ICD-10-CM | POA: Diagnosis not present

## 2019-10-08 NOTE — Telephone Encounter (Signed)
Cassie [name], Cassie [name] Clinical Pool  Phone Number: [phone]  Dr. Sabra Heck, I know we plan to do the biopsy in an operating room. I have a question about my left leg. The leg has nerve issues from my 21s and as you saw today, major cramping issues throughout the whole upper portion of my leg. I understand the O.R, will enable you to obtain the biopsy and perform the D&C. But, I am very concerned about my leg and possible damage to it. Although I know I won't feel the pain from the cramping, it will still be happening. I know you see me in a wheelchair in you office setting, because of degenerative disk and back seizing up when walking anything more than a short distance. But I am still mobile in that I can walk in my house, have no problems changing positions, getting up and down, getting in and out of my vehicle, even driving. I am concerned that during the OR procedure, of my leg is in a bad position and cramping etc., that it could cause permanent damage or worsened damage to my nerves and muscles. Not understanding how this would be avoided during the procedure? At the ED back in Nov., they had my left leg all over the place and I could barely walk for almost a week. I don't want to have a disability that may leave me worse than now. Since I would not be awake to tell you when the leg is hurting, how would we prevent injury? Also, I have a history of not doing well on "twilight " types of anesthesia. My blood pressure has went very low before. I do well on propofol during colonoscopies.Marland Kitchen although I am nervous about any anesthesia with this severe undiagnosed shortness of breath. Sorry for bombarding you. I know I have to have the biopsy and I want to, these issues are very concerning to me. Thank you for your patience and kindness today. Sorry for starting your day with a pain in the butt.  [name]

## 2019-10-08 NOTE — Telephone Encounter (Signed)
Routing to [place] to call Cassie [name].  Cassie [name] has hx of simple endometrial hyperplasia, mobility issues, morbid obesity.  Has initial biopsy done at [facility].  Biopsy was unsuccessful today because of Cassie [name] discomfort and difficulty with left leg cramping and positioning.  I had to try to do the biopsy with her left leg essentially extended.  I could see her cervix but could not manipulate the speculum very easily.  Repeat biopsy is appropriate.  I don't have any other suggestions for how to get endometrial sampling except to do it while she is under some anesthesia.  I know she is concerned but I cannot do nothing.  She also complained about the side effects from her progesterone therapy for the hyperplasia so she is going to take a two week break and see how many of the symptoms improve.  Could you please call her as my recommendation is to proceed with hysteroscopy with D&C.  Thanks.

## 2019-10-08 NOTE — Telephone Encounter (Signed)
Patient was seen in office today.   Routing to Dr. Sabra Heck to review.

## 2019-10-08 NOTE — Progress Notes (Signed)
GYNECOLOGY  VISIT  CC:   Follow up from simple endometrial hyperplasia  HPI: 59 y.o. G0P0000 Married White or Caucasian female here for follow up of endometrial hyperplasia.    After staring the norethindrone, pt reports a headache for about six weeks.  This has resolved so maybe was related to allergies.  She's also had hot flashes.  This has affected her sleep some.  She had a mild elevation in liver enzymes, first on 07/10/2019 with ALT was 47 and then repeated again 07/30/2019 and the ALT was 36.  She did have a CT scan on 07/11/2019.  She's also noticed some mild worsening of SOB that she's not sure is related but it is a change.  She is taking 35m of norethindrone.    Denies vaginal bleeding.  She is having some vaginal discharge that clear.  She has noticed the discharge is milky colored or yellowish in color.  She does wear a mini pad.  She will see a dime-sized amount in a mini pad.  She is having some lower pelvic/abdominal pain.  She can notice this in the LLQ or RLQ and it can be cramping with occasional sharp pains.  Pressure on the abdomen does help with pain.  The CT was done for this reason.    Last biopsy was 04/11/2019 with simple endometrial hyperplasia without atypia.  GYNECOLOGIC HISTORY: No LMP recorded. Patient is postmenopausal. Contraception: PMP Menopausal hormone therapy: n/a  Patient Active Problem List   Diagnosis Date Noted  . Aortic atherosclerosis ([place] 08/22/2019  . Panniculitis 07/12/2019  . NSVT (nonsustained ventricular tachycardia) ([place] 07/10/2019  . Right lower quadrant abdominal pain 07/08/2019  . Simple endometrial hyperplasia 06/23/2019  . Anticoagulated 06/23/2019  . Postmenopausal bleeding 04/03/2019  . Hypotension 05/23/2018  . Cough variant asthma vs UACS from gerd 01/01/2018  . PAC (premature atrial contraction) 08/17/2017  . PVC (premature ventricular contraction) 08/17/2017  . PAT (paroxysmal atrial tachycardia) ([place] 08/17/2017  . Leg edema  05/19/2017  . Other fatigue 12/10/2016  . Myocardial bridge 12/10/2016  . Coronary artery calcification 12/10/2016  . History of pulmonary embolism 12/10/2016  . GERD (gastroesophageal reflux disease) 07/12/2016  . Dyspnea on exertion 05/31/2016  . Chronic venous insufficiency 05/31/2016  . UC (ulcerative colitis) ([place] 01/28/2015  . Routine general medical examination at a health care facility 06/27/2014  . Left medial knee pain 07/17/2013  . Adrenal incidentaloma ([place] 03/25/2013  . Allergic rhinitis 09/13/2011  . B12 deficiency 11/28/2008  . Vitamin D deficiency 06/30/2008  . Palpitations 06/30/2008  . Morbid obesity ([place] 10/15/2007  . Anxiety about health 08/23/2007  . Cardiac dysrhythmia 08/23/2007    Past Medical History:  Diagnosis Date  . Adrenal nodule ([place]   . Allergy   . Anxiety   . Arthritis    knees,thumbs  . Cardiac arrhythmia    palpitations,PVC'sAC  . Cataract    bilateral removed   . Cyst of kidney, acquired    cyst right kidney- benign  . Degenerative disc disease, lumbar   . Edema   . Gallstones   . History of anemia    20 yrs. ago not now- pt denies   . Morbid obesity ([place]   . Osteoarthritis   . Other allergy, other than to medicinal agents   . Panic disorder   . Pulmonary embolism ([place]   . Run of atrial premature complexes   . Ulcerative colitis, unspecified   . Vitamin B12 deficiency   . Vitamin D deficiency  Past Surgical History:  Procedure Laterality Date  . BREAST BIOPSY  11/2011   Left- benign  . CATARACT EXTRACTION Right   . CATARACT EXTRACTION Left   . CHOLECYSTECTOMY N/A 01/17/2014   Procedure: LAPAROSCOPIC CHOLECYSTECTOMY;  Surgeon: Excell Seltzer, MD;  Location: [facility];  Service: General;  Laterality: N/A;  . COLONOSCOPY    . RETINAL LASER PROCEDURE Left   . SIGMOIDOSCOPY      MEDS:   Current Outpatient Medications on File Prior to Visit  Medication Sig Dispense Refill  . norethindrone (AYGESTIN) 5 MG tablet  TAKE 2 TABLETS(10 MG) BY MOUTH DAILY 60 tablet 5  . acetaminophen (TYLENOL) 325 MG tablet Take 650 mg by mouth every 6 (six) hours as needed.    . ALPRAZolam (XANAX) 1 MG tablet TAKE 1/2 TO 1 TABLET BY MOUTH 2 TIMES DAILY AS NEEDED FOR ANXIETY. 60 tablet 3  . Ascorbic Acid (VITAMIN C PO) Take by mouth daily as needed.    Marland Kitchen atenolol (TENORMIN) 25 MG tablet Take 0.5 tablets (12.5 mg total) by mouth 2 (two) times daily. 90 tablet 2  . cephALEXin (KEFLEX) 500 MG capsule Take 1 capsule (500 mg total) by mouth 2 (two) times daily. (Patient not taking: Reported on 08/22/2019) 14 capsule 0  . Cholecalciferol (VITAMIN D3) 2000 units capsule Take 2,000 Units by mouth as needed.     Marland Kitchen ELIQUIS 5 MG TABS tablet TAKE 1 TABLET BY MOUTH TWICE DAILY 60 tablet 6  . loperamide (IMODIUM A-D) 2 MG tablet Take 2 mg by mouth 4 (four) times daily as needed for diarrhea or loose stools.    Vladimir Faster Glycol-Propyl Glycol (SYSTANE OP) Place 1 drop into both eyes daily as needed (dry eyes).    . Probiotic Product (PROBIOTIC DAILY PO) Take 2 capsules by mouth daily with lunch.     . Simethicone (GAS-X PO) Take 1 tablet by mouth 4 (four) times daily as needed (For gas.). Do not exceed 6 tablets in a 24 hour period.    . vitamin B-12 (CYANOCOBALAMIN) 500 MCG tablet Take 500 mcg by mouth as needed.      No current facility-administered medications on file prior to visit.    ALLERGIES: Doxycycline, Hydrocodone, Sudafed [pseudoephedrine hcl], Acular [ketorolac tromethamine], Diphenhydramine hcl, Lactose intolerance (gi), Mesalamine, Sulfasalazine, Azithromycin, Caffeine, Penicillins, and Prednisone  Family History  Problem Relation Age of Onset  . Heart disease Father 37       pacemaker  . Atrial fibrillation Father   . Hypertension Maternal Aunt   . Hypertension Maternal Grandfather   . Colon cancer Neg Hx   . Stomach cancer Neg Hx   . Colon polyps Neg Hx   . Esophageal cancer Neg Hx   . Rectal cancer Neg Hx      SH:  Married, non smoker  Review of Systems  Respiratory: Positive for shortness of breath.   Musculoskeletal:       Leg cramping    PHYSICAL EXAMINATION:    BP 118/76   Pulse 70   Temp 97.9 F (36.6 C) (Skin)   Resp 16     General appearance: alert, cooperative and appears stated age Lymph:  no inguinal LAD noted  Pelvic: External genitalia:  no lesions              Urethra:  normal appearing urethra with no masses, tenderness or lesions              Bartholins and Skenes: normal  Vagina: normal appearing vagina with normal color and discharge, no lesions              Cervix: no lesions              Bimanual Exam:  Uterus:  normal size, contour, position, consistency, mobility, non-tender   Endometrial biopsy recommended.  Discussed with patient.  Verbal and written consent obtained.   Procedure:  Speculum placed.  Cervix visualized and cleansed with betadine prep.  A single toothed tenaculum was applied to the anterior lip of the cervix.  Endometrial pipelle could not be advance through os due to positioning.  pt could only have have down so far and every time her left leg was moved at all, she had cramping.  So this was attempted with her left leg extended.  Due to obesity, positioning was very difficult.  I could so the os but could not get the pipelle to advance.  Attempt made to dilate cervix with milex dilator but pt had significant pain.  Procedure stopped due to pt discomfort and difficulty with positioning.   Tenculum removed.  Minimal bleeding noted.  Patient tolerated procedure well.  Chaperone, Royal Hawthorn, CMA, was present for exam.  Assessment: Simple endometrial hyperplasia Morbid obesity H/o PE, followed by Dr. Marin Olp H/o fatty liver  Plan: As this was unsuccessful, will plan hysteroscopy with D&C in OR for endometrial assessment Will need to communicate with Dr. Marin Olp regarding stopping Eliquis

## 2019-10-09 NOTE — Telephone Encounter (Signed)
Spoke with patient. Advised of message as seen below from [place]. Patient verbalizes understanding. Patient has many concerns regarding proceeding with hysteroscopy D&C. Patient does not wish to proceed with hysteroscopy D&C until trying to have another EMB in the office. Patient is requesting to return to the office with her husband to attempt EMB again before going to the OR. Reports her husband was able to help support her leg when she had this done at [facility]. Patient is aware that this may still not be successful and hysteroscopy D&C may still be needed. Patient would like me to review with Dr.Miller and return call.  Dr.Miller, okay for return visit to attempt EMB in office before proceeding to OR?

## 2019-10-10 NOTE — Telephone Encounter (Signed)
Spoke with patient. Appointment scheduled for 10/14/2019 at 4 pm with Dr.Miller. Blocked 1 hour of time for appointment. Patient declined earlier appointments dates and times due to her husbands work schedule. Will look into options for procedure room exam table.  Routing to provider and will close encounter.

## 2019-10-10 NOTE — Telephone Encounter (Signed)
Changed to 30 minute appointment.

## 2019-10-10 NOTE — Telephone Encounter (Signed)
I don't think I need an hour for this as we had a lot of discussion.  30 minute visit is fine.

## 2019-10-10 NOTE — Telephone Encounter (Signed)
Yes.  Do we have access to stirrups for the procedure table?  That might make this easier as well.  Thanks.

## 2019-10-18 ENCOUNTER — Telehealth: Payer: Self-pay | Admitting: Internal Medicine

## 2019-10-18 NOTE — Telephone Encounter (Signed)
[facility] call report : ---Caller states that she has had a sinus issue recently. She has been taking down a [place] and very detailed work. She also has back issues. She was bending over reaching into boxes and she had some empty bags that were on the floor and putting them in the box. She was taking her time. She felt a sensation in her head the size of a 50 cent piece felt sore and tingly or stinging. No bump on the head. She sat down and rested. She states that the scalp felt sore to the touch ( happened yesterday at 3 pm). She states that the area has a cool feeling but is not sore to the touch. No stings or bites. It happened when she bent forward. There is nothing that she can see on the scalp. She states that she had a PE in 2018 and is on Eliquis. Has had intermittent headaches that she had with the sinus issues Advise - SEE PCP WITHIN 3 DAYS  LVM for patient to call back and set up a virtual visit.

## 2019-10-18 NOTE — Telephone Encounter (Signed)
New message:    Pt called and stated she has been having a tingling sensation on the top of her head. She states she feels like it would be wet if she touched it but it not. I have transferred the pt to [facility].

## 2019-10-23 ENCOUNTER — Other Ambulatory Visit: Payer: Self-pay

## 2019-10-24 ENCOUNTER — Ambulatory Visit (INDEPENDENT_AMBULATORY_CARE_PROVIDER_SITE_OTHER): Payer: 59 | Admitting: Obstetrics & Gynecology

## 2019-10-24 ENCOUNTER — Encounter: Payer: Self-pay | Admitting: Obstetrics & Gynecology

## 2019-10-24 ENCOUNTER — Other Ambulatory Visit (HOSPITAL_COMMUNITY)
Admission: RE | Admit: 2019-10-24 | Discharge: 2019-10-24 | Disposition: A | Discharge status: Home / Self Care | Payer: 59 | Source: Ambulatory Visit | Attending: Obstetrics & Gynecology | Admitting: Obstetrics & Gynecology

## 2019-10-24 VITALS — BP 120/80 | HR 70 | Temp 97.3°F | Resp 16

## 2019-10-24 DIAGNOSIS — N8501 Benign endometrial hyperplasia: Secondary | ICD-10-CM | POA: Diagnosis present

## 2019-10-25 ENCOUNTER — Encounter: Payer: Self-pay | Admitting: Internal Medicine

## 2019-10-25 ENCOUNTER — Telehealth: Payer: Self-pay | Admitting: *Deleted

## 2019-10-25 ENCOUNTER — Ambulatory Visit: Payer: 59 | Admitting: Internal Medicine

## 2019-10-25 ENCOUNTER — Other Ambulatory Visit: Payer: Self-pay

## 2019-10-25 ENCOUNTER — Encounter: Payer: Self-pay | Admitting: Family

## 2019-10-25 ENCOUNTER — Encounter: Payer: Self-pay | Admitting: Obstetrics & Gynecology

## 2019-10-25 VITALS — BP 136/84 | HR 64 | Temp 98.7°F | Ht 65.0 in

## 2019-10-25 DIAGNOSIS — R519 Headache, unspecified: Secondary | ICD-10-CM | POA: Diagnosis not present

## 2019-10-25 DIAGNOSIS — J3089 Other allergic rhinitis: Secondary | ICD-10-CM | POA: Diagnosis not present

## 2019-10-25 DIAGNOSIS — R7989 Other specified abnormal findings of blood chemistry: Secondary | ICD-10-CM | POA: Insufficient documentation

## 2019-10-25 LAB — CBC
HCT: 42.7 % (ref 36.0–46.0)
Hemoglobin: 14 g/dL (ref 12.0–15.0)
MCHC: 32.8 g/dL (ref 30.0–36.0)
MCV: 95.9 fl (ref 78.0–100.0)
Platelets: 274 10*3/uL (ref 150.0–400.0)
RBC: 4.45 Mil/uL (ref 3.87–5.11)
RDW: 15.2 % (ref 11.5–15.5)
WBC: 7.7 10*3/uL (ref 4.0–10.5)

## 2019-10-25 LAB — COMPREHENSIVE METABOLIC PANEL
ALT: 50 U/L — ABNORMAL HIGH (ref 0–35)
AST: 45 U/L — ABNORMAL HIGH (ref 0–37)
Albumin: 3.6 g/dL (ref 3.5–5.2)
Alkaline Phosphatase: 46 U/L (ref 39–117)
BUN: 11 mg/dL (ref 6–23)
CO2: 26 mEq/L (ref 19–32)
Calcium: 8.9 mg/dL (ref 8.4–10.5)
Chloride: 104 mEq/L (ref 96–112)
Creatinine, Ser: 0.86 mg/dL (ref 0.40–1.20)
GFR: 67.17 mL/min (ref >60.00)
Glucose, Bld: 144 mg/dL — ABNORMAL HIGH (ref 70–99)
Potassium: 3.9 mEq/L (ref 3.5–5.1)
Sodium: 136 mEq/L (ref 135–145)
Total Bilirubin: 0.5 mg/dL (ref 0.2–1.2)
Total Protein: 7.2 g/dL (ref 6.0–8.3)

## 2019-10-25 NOTE — Telephone Encounter (Signed)
See telephone encounter dated 10/25/19.

## 2019-10-25 NOTE — Patient Instructions (Signed)
We will check the labs today and let you know about the results.

## 2019-10-25 NOTE — Assessment & Plan Note (Signed)
Recheck today, likely is related to her aygestin therapy.

## 2019-10-25 NOTE — Progress Notes (Signed)
   Subjective:   Patient ID: Cassie [name], female    DOB: 05-Mar-1961, 59 y.o.   MRN: [id number]  HPI The patient is a 59 YO female coming in for head soreness (started around the same time as symptoms with the sinuses, middle of her scalp she is feeling sore and to her feels as if this is hot to touch, her husband felt and did not notice any warmth, denies fevers or chills, has tried ibuprofen which helped but she got some muscle spasms and chills about 40 minutes afterwards so she did not take again) and loss of taste/smell (started with stopping at father's house to get some things for him at nursing home, she lost taste and smell later that day, accompanied by sinus issues for 3-4 days, then went away, returned exactly 1 week later when she also was stopping by the house, went away in same time period, went on another week, this past week she had stayed at home without leaving house or going outside and still got symptoms on Saturday night with loss taste/smell and sinus congestion, this is clearing up and mostly gone now, not taking anything for sinuses due to otc antihistamines causing her heart palpitations, denies fevers or chills) and follow up liver numbers (denies yellow tone to skin, itching, dark urine, change in medications).   Review of Systems  Constitutional: Negative.   HENT: Positive for congestion, rhinorrhea and sinus pressure.   Eyes: Negative.   Respiratory: Positive for shortness of breath. Negative for cough and chest tightness.   Cardiovascular: Negative for chest pain, palpitations and leg swelling.  Gastrointestinal: Negative for abdominal distention, abdominal pain, constipation, diarrhea, nausea and vomiting.  Musculoskeletal: Negative.   Skin: Negative.   Neurological: Positive for headaches.  Psychiatric/Behavioral: Negative.     Objective:  Physical Exam Constitutional:      Appearance: She is well-developed. She is obese.  HENT:     Head: Normocephalic and  atraumatic.     Comments: Scalp with minimal tenderness to palpation midline, not warm to touch and no scalp rash    Mouth/Throat:     Comments: Oropharynx with redness and clear drainage, no sinus pain on frontal palpation.  Cardiovascular:     Rate and Rhythm: Normal rate and regular rhythm.  Pulmonary:     Effort: Pulmonary effort is normal. No respiratory distress.     Breath sounds: Normal breath sounds. No wheezing or rales.  Abdominal:     General: Bowel sounds are normal. There is no distension.     Palpations: Abdomen is soft.     Tenderness: There is no abdominal tenderness. There is no rebound.  Musculoskeletal:     Cervical back: Normal range of motion.  Skin:    General: Skin is warm and dry.  Neurological:     Mental Status: She is alert and oriented to person, place, and time.     Coordination: Coordination normal.     Vitals:   10/25/19 0810  BP: 136/84  Pulse: 64  Temp: 98.7 F (37.1 C)  TempSrc: Oral  SpO2: 98%  Height: 5' 5"  (1.651 m)   This visit occurred during the SARS-CoV-2 public health emergency.  Safety protocols were in place, including screening questions prior to the visit, additional usage of staff PPE, and extensive cleaning of exam room while observing appropriate contact time as indicated for disinfecting solutions.   Assessment & Plan:

## 2019-10-25 NOTE — Assessment & Plan Note (Signed)
No signs of sinus infection on exam. Offered rx for singulair to try but she declines today. Will continue with sinus rinses to help.

## 2019-10-25 NOTE — Telephone Encounter (Signed)
Spoke with patient. Patient reports cramping is minimal this morning and bleeding has reduced. She has had a pad on since approximately 8am, reports "maybe a nickle size spot". Patient was seen by her PCP this morning for a scheduled OV. Denies fever/chills, N/V, or vag odor. Patient held her Eliquis last night, per review of Epic, was advised by hematology ok to hold for the next 2 days.   Advised may experience spotting and cramping for few days after EMB, this should resolve. Advised to continue monitoring symptoms, ER precautions provided for heavy bleeding, changing saturated pad q1-2 hours, severe pain, fever/chills. Instructions provided for on-call provider should she have any additional concerns since we are going into the weekend. Patient unable to take motrin, discussed option of tylenol or heating pad for comfort PRN. Advised will review with Dr. Sabra Heck and our office will return call if any additional recommendations. Patient verbalizes understanding and is thankful for follow-up.   Routing to Dr. Sabra Heck for final review.

## 2019-10-25 NOTE — Assessment & Plan Note (Signed)
Could be related to sinuses and offered singulair to help with that but she declines. BP at goal. Checking CMP and CBC to rule out metabolic causes.

## 2019-10-25 NOTE — Telephone Encounter (Signed)
Cassie [name] [facility] Hey Dr. Sabra Heck, After the biopsy this afternoon, I've had some intense cramping for around 6 hours, worst I remember having before. It eases of and is now mild and seldom. It is 12:45 am and I am bleeding significantly. I knew bleeding and spotting would occur, but this bleeding is heavier than I expected. I decided to skip my eliquis tonight. Would this all be normal and around how long should I expect it to last? I think this bleeding is a bit more than when I first had the issue in November, before the first biopsy. I don't remember the cramping or bleeding being this bad after the first biopsy either. Please let me know what to expect and if this is normal and any advice from you, if needed. Thank you again for your kindness, patience and good care of me today. You are a blessing.

## 2019-10-27 NOTE — Progress Notes (Addendum)
GYNECOLOGY  VISIT  CC:   Endometrial biopsy  HPI: 59 y.o. G0P0000 Married White or Caucasian female here for endometrial biopsy.  This was attempted on 10/08/2019 but due to inability to position pt adequately (she felt like the couldn't breath if the head of table was back much and that her left leg was cramping if extended at the hip much).  D&C in OR suggested with possible hysteroscopy.  Pt was initially in favor or proceeding but decided to try again in the office.  Her husband is with her today to help retract her left leg.  She denies vaginal bleeding.  GYNECOLOGIC HISTORY: No LMP recorded. Patient is postmenopausal. Contraception: PMP Menopausal hormone therapy: progesterone therapy  Patient Active Problem List   Diagnosis Date Noted  . Head pain 10/25/2019  . Elevated LFTs 10/25/2019  . Aortic atherosclerosis ([place]) 08/22/2019  . NSVT (nonsustained ventricular tachycardia) ([place]) 07/10/2019  . Right lower quadrant abdominal pain 07/08/2019  . Simple endometrial hyperplasia 06/23/2019  . Anticoagulated 06/23/2019  . Postmenopausal bleeding 04/03/2019  . Cough variant asthma vs UACS from gerd 01/01/2018  . PAC (premature atrial contraction) 08/17/2017  . PVC (premature ventricular contraction) 08/17/2017  . PAT (paroxysmal atrial tachycardia) ([place]) 08/17/2017  . Leg edema 05/19/2017  . Other fatigue 12/10/2016  . Myocardial bridge 12/10/2016  . Coronary artery calcification 12/10/2016  . History of pulmonary embolism 12/10/2016  . GERD (gastroesophageal reflux disease) 07/12/2016  . Dyspnea on exertion 05/31/2016  . Chronic venous insufficiency 05/31/2016  . UC (ulcerative colitis) ([place]) 01/28/2015  . Routine general medical examination at a health care facility 06/27/2014  . Left medial knee pain 07/17/2013  . Adrenal incidentaloma ([place]) 03/25/2013  . Allergic rhinitis 09/13/2011  . B12 deficiency 11/28/2008  . Vitamin D deficiency 06/30/2008  . Palpitations 06/30/2008   . Morbid obesity ([place]) 10/15/2007  . Anxiety about health 08/23/2007  . Cardiac dysrhythmia 08/23/2007    Past Medical History:  Diagnosis Date  . Adrenal nodule ([place])   . Allergy   . Anxiety   . Arthritis    knees,thumbs  . Cardiac arrhythmia    palpitations,PVC'sAC  . Cataract    bilateral removed   . Cyst of kidney, acquired    cyst right kidney- benign  . Degenerative disc disease, lumbar   . Edema   . Gallstones   . History of anemia    20 yrs. ago not now- pt denies   . Morbid obesity ([place])   . Osteoarthritis   . Other allergy, other than to medicinal agents   . Panic disorder   . Pulmonary embolism ([place])   . Run of atrial premature complexes   . Ulcerative colitis, unspecified   . Vitamin B12 deficiency   . Vitamin D deficiency     Past Surgical History:  Procedure Laterality Date  . BREAST BIOPSY  11/2011   Left- benign  . CATARACT EXTRACTION Right   . CATARACT EXTRACTION Left   . CHOLECYSTECTOMY N/A 01/17/2014   Procedure: LAPAROSCOPIC CHOLECYSTECTOMY;  Surgeon: Excell Seltzer, MD;  Location: [facility];  Service: General;  Laterality: N/A;  . COLONOSCOPY    . RETINAL LASER PROCEDURE Left   . SIGMOIDOSCOPY      MEDS:   Current Outpatient Medications on File Prior to Visit  Medication Sig Dispense Refill  . acetaminophen (TYLENOL) 325 MG tablet Take 650 mg by mouth every 6 (six) hours as needed.    . ALPRAZolam (XANAX) 1 MG tablet TAKE 1/2 TO 1  TABLET BY MOUTH 2 TIMES DAILY AS NEEDED FOR ANXIETY. 60 tablet 3  . Ascorbic Acid (VITAMIN C PO) Take by mouth daily as needed.    Marland Kitchen atenolol (TENORMIN) 25 MG tablet Take 0.5 tablets (12.5 mg total) by mouth 2 (two) times daily. 90 tablet 2  . Cholecalciferol (VITAMIN D3) 2000 units capsule Take 2,000 Units by mouth as needed.     Marland Kitchen ELIQUIS 5 MG TABS tablet TAKE 1 TABLET BY MOUTH TWICE DAILY 60 tablet 6  . loperamide (IMODIUM A-D) 2 MG tablet Take 2 mg by mouth 4 (four) times daily as needed for diarrhea or loose  stools.    . norethindrone (AYGESTIN) 5 MG tablet TAKE 2 TABLETS(10 MG) BY MOUTH DAILY 60 tablet 5  . Polyethyl Glycol-Propyl Glycol (SYSTANE OP) Place 1 drop into both eyes daily as needed (dry eyes).    . Probiotic Product (PROBIOTIC DAILY PO) Take 2 capsules by mouth daily with lunch.     . Simethicone (GAS-X PO) Take 1 tablet by mouth 4 (four) times daily as needed (For gas.). Do not exceed 6 tablets in a 24 hour period.    . vitamin B-12 (CYANOCOBALAMIN) 500 MCG tablet Take 500 mcg by mouth as needed.      No current facility-administered medications on file prior to visit.    ALLERGIES: Doxycycline, Hydrocodone, Sudafed [pseudoephedrine hcl], Acular [ketorolac tromethamine], Diphenhydramine hcl, Lactose intolerance (gi), Mesalamine, Sulfasalazine, Azithromycin, Caffeine, Penicillins, and Prednisone  Family History  Problem Relation Age of Onset  . Heart disease Father 59       pacemaker  . Atrial fibrillation Father   . Hypertension Maternal Aunt   . Hypertension Maternal Grandfather   . Colon cancer Neg Hx   . Stomach cancer Neg Hx   . Colon polyps Neg Hx   . Esophageal cancer Neg Hx   . Rectal cancer Neg Hx     SH:  Married, non smoker  Review of Systems  Constitutional: Negative.   Gastrointestinal: Negative.   Genitourinary: Negative.     PHYSICAL EXAMINATION:    BP 120/80   Pulse 70   Temp (!) 97.3 F (36.3 C) (Skin)   Resp 16     General appearance: alert, cooperative and appears stated age Lymph:  no inguinal LAD noted  Pelvic: External genitalia:  no lesions              Urethra:  normal appearing urethra with no masses, tenderness or lesions              Bartholins and Skenes: normal                 Vagina: normal appearing vagina with normal color and discharge, no lesions              Cervix: no lesions              Bimanual Exam:  Uterus:  normal size, contour, position, consistency, mobility, non-tender              Adnexa: no mass, fullness,  tenderness   Endometrial biopsy recommended.  Discussed with patient.  Verbal and written consent obtained.   Procedure:  Speculum placed.  Cervix visualized and cleansed with betadine prep.  A single toothed tenaculum was applied to the anterior lip of the cervix.  Endometrial pipelle was advanced through the cervix into the endometrial cavity without difficulty.  Pipelle passed to 7cm.  Suction applied and pipelle removed with good tissue  sample obtained.  Tenculum removed.  No bleeding noted.  Patient tolerated procedure well.  Chaperone, Royal Hawthorn, CMA, was present for exam.  Assessment: Simple endometrial hyperplasia without atypia Morbid obesity Unsure if she has been fully complaint with progesterone therapy treatment H/o PE, on eliquis Elevated liver enzymes  Plan: Endometrial biopsy obtained today and sent to pathology.  Results will be called to pt and recommendations made at that point. Pt desires stopping the progesterone therapy for two weeks to have liver enzymes rechecked.  Advised not to make any changes until pathology is completed.

## 2019-10-28 ENCOUNTER — Encounter: Payer: Self-pay | Admitting: Obstetrics & Gynecology

## 2019-10-28 ENCOUNTER — Ambulatory Visit: Payer: 59 | Admitting: Obstetrics & Gynecology

## 2019-10-28 ENCOUNTER — Other Ambulatory Visit: Payer: Self-pay

## 2019-10-28 ENCOUNTER — Telehealth: Payer: Self-pay

## 2019-10-28 VITALS — BP 114/62 | HR 68 | Temp 98.2°F | Ht 65.0 in

## 2019-10-28 DIAGNOSIS — Z86711 Personal history of pulmonary embolism: Secondary | ICD-10-CM

## 2019-10-28 DIAGNOSIS — N950 Postmenopausal bleeding: Secondary | ICD-10-CM

## 2019-10-28 DIAGNOSIS — Z6841 Body Mass Index (BMI) 40.0 and over, adult: Secondary | ICD-10-CM | POA: Diagnosis not present

## 2019-10-28 DIAGNOSIS — N8501 Benign endometrial hyperplasia: Secondary | ICD-10-CM | POA: Diagnosis not present

## 2019-10-28 LAB — SURGICAL PATHOLOGY

## 2019-10-28 NOTE — Telephone Encounter (Signed)
Spoke with patient. Patient is s/p EMB 10/24/19, result pending. Patient reports intermittent intense cramping throughout the weekend, doubling over at times and spotting. Has tried OTC tylenol, no change. Is not using a pad, using toilet tissue. Denies fever/chills, N/V, vag odor. She restarted Eliquis on 6/20. Denies any pain at the moment, has not gotten up to move around this morning, is concerned pain will increase with movement.   Advised patient can take a few days for the spotting and cramping to completely resolve. Advised if pain is increasing in intensity and frequency,  OV needed for further evaluation. Patient request to schedule OV. OV scheduled for today at 12:30pm with Dr. Sabra Heck. Patient states she is going to continue to monitor through the morning. Will return call to cancel OV if no pain is present. Reports scant amount of brown blood on toilet tissue at this time.   Routing to provider for final review. Patient is agreeable to disposition. Will close encounter.

## 2019-10-28 NOTE — Telephone Encounter (Signed)
Patient is calling in regards to heavy bleeding and heavy cramping after biopsy last week.

## 2019-10-28 NOTE — Progress Notes (Signed)
GYNECOLOGY  VISIT  CC:   Patient complains of having "really bad" cramping that started a few hours after EMB and bleeding. Per patient, some vaginal tissue came out Saturday.  HPI: 59 y.o. G0P0000 Married White or Caucasian female here for cramping after having endometrial biopsy on Thursday for h/o simple endometrial hyperplasia.  Biopsy result is back and this was reviewed with pt today.  Biopsy showed benign tissue with progestin effect.  She has been on 82m aygestin.  We discussed using 529mdosage for maintenance.  She knows she needs to take this for at least 2 years but I feel she would benefit from long term therapy due to her obesity with BMI of 54.  D/w pt today.  Husband present.  For cramping that occurred this weekend, she really didn't take anything.  Reports she took tylenol twice but it wasn't for the cramping so not sure if this helped or not.    Due to bleeding, she stopped her Eliquis for a few days.  Reviewed with hematology before doing this.  She has started back.     Denies fever or abnormal vaginal discharge.   GYNECOLOGIC HISTORY: No LMP recorded. Patient is postmenopausal. Contraception: Postmenopausal Menopausal hormone therapy: none  Patient Active Problem List   Diagnosis Date Noted  . Head pain 10/25/2019  . Elevated LFTs 10/25/2019  . Aortic atherosclerosis (HCNew Cordell04/15/2021  . NSVT (nonsustained ventricular tachycardia) (HCFair[facility] Beach03/07/2019  . Right lower quadrant abdominal pain 07/08/2019  . Simple endometrial hyperplasia 06/23/2019  . Anticoagulated 06/23/2019  . Postmenopausal bleeding 04/03/2019  . Cough variant asthma vs UACS from gerd 01/01/2018  . PAC (premature atrial contraction) 08/17/2017  . PVC (premature ventricular contraction) 08/17/2017  . PAT (paroxysmal atrial tachycardia) (HCMinersville04/03/2018  . Leg edema 05/19/2017  . Other fatigue 12/10/2016  . Myocardial bridge 12/10/2016  . Coronary artery calcification 12/10/2016  . History of  pulmonary embolism 12/10/2016  . GERD (gastroesophageal reflux disease) 07/12/2016  . Dyspnea on exertion 05/31/2016  . Chronic venous insufficiency 05/31/2016  . UC (ulcerative colitis) (HCLarned09/21/2016  . Routine general medical examination at a health care facility 06/27/2014  . Left medial knee pain 07/17/2013  . Adrenal incidentaloma (HCBell[facility] [place]11/17/2014  . Allergic rhinitis 09/13/2011  . B12 deficiency 11/28/2008  . Vitamin D deficiency 06/30/2008  . Palpitations 06/30/2008  . Morbid obesity (HCWeston Mills06/12/2007  . Anxiety about health 08/23/2007  . Cardiac dysrhythmia 08/23/2007    Past Medical History:  Diagnosis Date  . Adrenal nodule (HCGreenville  . Allergy   . Anxiety   . Arthritis    knees,thumbs  . Cardiac arrhythmia    palpitations,PVC'sAC  . Cataract    bilateral removed   . Cyst of kidney, acquired    cyst right kidney- benign  . Degenerative disc disease, lumbar   . Edema   . Gallstones   . History of anemia    20 yrs. ago not now- pt denies   . Morbid obesity (HCManila  . Osteoarthritis   . Other allergy, other than to medicinal agents   . Panic disorder   . Pulmonary embolism (HCHar[place]  . Run of atrial premature complexes   . Ulcerative colitis, unspecified   . Vitamin B12 deficiency   . Vitamin D deficiency     Past Surgical History:  Procedure Laterality Date  . BREAST BIOPSY  11/2011   Left- benign  . CATARACT EXTRACTION Right   . CATARACT EXTRACTION Left   .  CHOLECYSTECTOMY N/A 01/17/2014   Procedure: LAPAROSCOPIC CHOLECYSTECTOMY;  Surgeon: Excell Seltzer, MD;  Location: [facility];  Service: General;  Laterality: N/A;  . COLONOSCOPY    . RETINAL LASER PROCEDURE Left   . SIGMOIDOSCOPY      MEDS:   Current Outpatient Medications on File Prior to Visit  Medication Sig Dispense Refill  . acetaminophen (TYLENOL) 325 MG tablet Take 650 mg by mouth every 6 (six) hours as needed.    . ALPRAZolam (XANAX) 1 MG tablet TAKE 1/2 TO 1 TABLET BY MOUTH 2 TIMES DAILY AS  NEEDED FOR ANXIETY. 60 tablet 3  . Ascorbic Acid (VITAMIN C PO) Take by mouth daily as needed.    Marland Kitchen atenolol (TENORMIN) 25 MG tablet Take 0.5 tablets (12.5 mg total) by mouth 2 (two) times daily. 90 tablet 2  . Cholecalciferol (VITAMIN D3) 2000 units capsule Take 2,000 Units by mouth as needed.     Marland Kitchen ELIQUIS 5 MG TABS tablet TAKE 1 TABLET BY MOUTH TWICE DAILY 60 tablet 6  . loperamide (IMODIUM A-D) 2 MG tablet Take 2 mg by mouth 4 (four) times daily as needed for diarrhea or loose stools.    . norethindrone (AYGESTIN) 5 MG tablet TAKE 2 TABLETS(10 MG) BY MOUTH DAILY 60 tablet 5  . Polyethyl Glycol-Propyl Glycol (SYSTANE OP) Place 1 drop into both eyes daily as needed (dry eyes).    . Probiotic Product (PROBIOTIC DAILY PO) Take 2 capsules by mouth daily with lunch.     . Simethicone (GAS-X PO) Take 1 tablet by mouth 4 (four) times daily as needed (For gas.). Do not exceed 6 tablets in a 24 hour period.    . vitamin B-12 (CYANOCOBALAMIN) 500 MCG tablet Take 500 mcg by mouth as needed.      No current facility-administered medications on file prior to visit.    ALLERGIES: Doxycycline, Hydrocodone, Sudafed [pseudoephedrine hcl], Acular [ketorolac tromethamine], Diphenhydramine hcl, Lactose intolerance (gi), Mesalamine, Sulfasalazine, Azithromycin, Caffeine, Penicillins, and Prednisone  Family History  Problem Relation Age of Onset  . Heart disease Father 10       pacemaker  . Atrial fibrillation Father   . Hypertension Maternal Aunt   . Hypertension Maternal Grandfather   . Colon cancer Neg Hx   . Stomach cancer Neg Hx   . Colon polyps Neg Hx   . Esophageal cancer Neg Hx   . Rectal cancer Neg Hx     SH:  Married, non smoker  Review of Systems  Genitourinary: Positive for vaginal bleeding.  All other systems reviewed and are negative.   PHYSICAL EXAMINATION:    BP 114/62 (BP Location: Left Arm, Patient Position: Sitting, Cuff Size: Large)   Pulse 68   Temp 98.2 F (36.8 C)  (Temporal)   Ht 5' 5"  (1.651 m)   BMI 54.25 kg/m     General appearance: alert, cooperative and appears stated age Abdomen: soft, non-tender; bowel sounds normal; no masses,  no organomegaly Lymph:  no inguinal LAD noted  Pelvic: External genitalia:  no lesions              Urethra:  normal appearing urethra with no masses, tenderness or lesions              Bartholins and Skenes: normal                 Vagina: normal appearing vagina with minimal bleeding              Bimanual Exam:  Uterus:  normal size, contour, position, consistency, mobility, non-tender              Adnexa: no mass, fullness, tenderness  Chaperone, Terence Lux, CMA, was present for exam.  Assessment: Simple endometrial hyperplasia with resolution of this finding with endometrial biopsy Cramping/spotting post endometrial biopsy H/O PE, on Eliquis Morbid obesity  Plan: Recommended pt decrease aygestin to 69m daily.  If has future bleeding, will need repeat endometrial biopsy. Pt to monitor bleeding and spotting.  With Eliquis, she may have spotting for longer than would be typically expected after endometrial biopsy.  If persists, will need to proceed with PUS.

## 2019-10-28 NOTE — Telephone Encounter (Signed)
Clance Boll R  P [facility] Hi, it's 4:15, Saturday afternoon. On Friday during the day, it seemed bleeding, just a brownish spot and cramping, just a soreness, were close to being over. Friday evening, cramping worsened and bleeding started back, not heavy. I had took it easy through the day hoping things would ease off. Saturday morning, had still bled through the night, not heavily. Cramping bad again today, and bleeding today. Bleeding is more than spotting, but not heavy. When I just went to the bathroom, there was a piece of what looked like body tissue in with blood. It was definitely not a clot, blood wiped off of it, I should have took a pic, but didn't. It had a translucent toward white color, with a bit of darkness on the end. Had the texture of, for a lack of a better example, the rubbery glue that you can canbrub off the back of a credit card. I hope you know what I'm talking about? I'm a bit concerned as I have never saw anything like it before. Was maybe a quarter inch, maybe a bit more,  long. Not wide at all. It had the shape of , because I can think of nothing else.Marland KitchenMarland KitchenMarland Kitchena worm. It was not a worm, just that shape. Still cramping. Have you encountered this before? Never had anything but blood come out before. Not sure cramps should still be this bad either. Keeping a close watch. Thank you

## 2019-10-29 ENCOUNTER — Telehealth: Payer: Self-pay | Admitting: *Deleted

## 2019-10-29 ENCOUNTER — Encounter: Payer: Self-pay | Admitting: Obstetrics & Gynecology

## 2019-10-29 NOTE — Telephone Encounter (Signed)
Spoke with patient. Advised of message as seen below from [place]erm[facility]. Patient states that she is not currently having any pain. Reports her pain is intermittent-lots of cramping and sharp pain when it occurs. Changing "rolled toilet paper" every 3-4 hours. Passing small clots the size of a dime intermittently. Advised patient will need to be seen for PUS for further evaluation of pain/bleeding. Patient declines to have PUS done at [place] or the hospital. Only wants to have this done here in our office. Wants to wait until next week and is okay to see another provider if needed as Dr.Miller is out of the office. Discussed that Norethindrone does not cause diabetes. Advised diabetes and fatty liver are affected by weight. Patient verbalizes understanding. Advised will provide Dr.Miller with an update and return call in the morning for further planning. Bleeding precautions given to patient. Patient verbalizes understanding and will seek care at the ER if needed.

## 2019-10-29 NOTE — Telephone Encounter (Signed)
Call placed to pt. Pt states sending mychart message and wanted to ask more questions after OV on 10/28/19.  Spoke with pt. Pt reports having intermittent abd cramping and light to medium flow bleeding since 10/24/19 after EMB and wanting to know how long it suppose to last? Pt states changing only rolled up toilet paper for bleeding every 2-3 hours and having small clots size of dime to quarter.  Pt rates pain at 10 on pain scale.   Pt is prescribed Rx  Eliquis and started it back on 6/20 per Dr Vergia Alcon after EMB. Pt states with cramping today,did take ES Tylenol just now before call. Does not know if will relieve cramps.  Pt denies heavy bleeding or large clots.   Pt also asking about elevated liver enzymes on lab work from Dr [employer] office. Pt states wanting to know if has interaction when she was taking Aygestin Rx with liver enzymes and blood glucose? Can she have false positive labs with taking progesterone?  Pt states is not diabetic, but has H/O of fatty liver.  Advised will review with Dr Sabra Heck and return call with recommendations and advice.  Pt agreeable and verbalized understanding.

## 2019-10-29 NOTE — Telephone Encounter (Signed)
Uterus was non tender on exam yesterday.  I advised she could have intermittent bleeding for several weeks.  With continued pain/bleeding, she needs to have a PUS.  Ok to schedule at [facility].  Could do here as well but would have to be in the morning.  She does have obesity (as she knows) so diabetes and fatty liver are common with increased weight.  Norethindrone does not cause diabetes.

## 2019-10-29 NOTE — Telephone Encounter (Signed)
Cassie Boll R  P [name] Hi, Dr. Sabra Heck. You mentioned the high liver numbers, we were going to talk about them, but didn't. Also, the glucose is up high too? I think I read where progesterone can cause this also? Does the progesterone just give false readings, or are number truly going up? Don't want to risk liver problems, already have some fatty liver. I forgot the answer to a couple of things you told me: After the 2 years of progesterone therapy, how would we know if the lining thickened again? If it did, how would we tell what kind of cells were there? Not going to happen, but what if? And, how long did you say I can expect this bleeding and cramping to continue? Started back after I got home and is still happening this morning. The cramping is the worse part. Thanks

## 2019-10-30 ENCOUNTER — Telehealth: Payer: Self-pay

## 2019-10-30 ENCOUNTER — Other Ambulatory Visit: Payer: Self-pay

## 2019-10-30 ENCOUNTER — Encounter: Payer: Self-pay | Admitting: Obstetrics & Gynecology

## 2019-10-30 DIAGNOSIS — R102 Pelvic and perineal pain: Secondary | ICD-10-CM

## 2019-10-30 DIAGNOSIS — N9489 Other specified conditions associated with female genital organs and menstrual cycle: Secondary | ICD-10-CM

## 2019-10-30 NOTE — Telephone Encounter (Signed)
Spoke with patient at time of incoming call. Patient states that she has been having cramping that is 4-5/10 in severity every 5 minutes since 10 am. Patient has not taken any medication. States the last time she took Ibuprofen she thinks she had an allergic reaction. After taking her tongue and throat were itchy. Does not want to take out to be safe. Reports light bleeding with dime sized clots. Denies heavy bleeding, fever, chills, nausea, vomiting, vaginal odor or discharge. Reports cramping is very mild currently. Advised patient will need to be seen for recommended PUS as advised by Dr.Miller previously. Patient declines to be seen at [place] or the hospital for imaging. Patient states she will be seen at [facility] if available. Advised will call to review scheduling and return call.  Call to [place]. No appointments available until next week.  Call to [facility] PUS scheduled for 3 pm 11/01/2019.  Call to patient who states that she cannot be seen at [facility] Friday afternoon due to husbands work schedule. Appointment cancelled. Patient declines all other imaging locations. States she will go to [facility] if this is her only option and if they have a female Optician, dispensing. Patient questioning why PUS is urgent. Advised with continuing pain that she is rating at a 5-10/10 further evaluation is needed. Patient verbalizes understanding. Advised will review further with Dr.Miller and return call. ER precautions given.

## 2019-10-30 NOTE — Telephone Encounter (Signed)
Spoke with patient to schedule PUS in office for tomorrow with Dr.Miller. Offered appointment tomorrow at 3:30 pm. Patient declines at this time. Patient states that she is having very mild intermittent cramping and spotting. Does not want to have a PUS if symptoms are resolving. Advised will update Dr.Miller and if symptoms worsen she will need to notify the office so that further evaluation can be planned. Patient is agreeable. ER precautions given.

## 2019-10-31 NOTE — Telephone Encounter (Signed)
Patient calling to speak with [facility] regarding ultrasound appointment because [facility] in [place] has appointments today.

## 2019-10-31 NOTE — Telephone Encounter (Signed)
Spoke with patient. Patient states that since 7 pm last night she has had no cramping. Reports only having light bleeding with very small clots. Denies any heavy bleeding, fever, chills, nausea, vomiting, vaginal odor/discharge. States she spoke with [facility] this morning and they have PUS openings this afternoon. Patient is asking if she should have PUS today at their location. She does not wish to have PUS and potentially cause more cramping or bleeding. Would like Dr.Miller's recommendation. Advised will review with Dr.Miller and return call.

## 2019-10-31 NOTE — Telephone Encounter (Signed)
Spoke with patient. Advised reviewed with Dr.Miller who recommends that she proceed with PUS today. Patient states that she has still not had any cramping today and does not wish to proceed with PUS at this time. Would like to continue to monitor symptoms. Advised patient again of recommendations for PUS today. Patient would like to monitor and return call if symptoms return. Patient is aware of ER precautions.  Routing to provider and will close encounter.

## 2019-11-01 ENCOUNTER — Other Ambulatory Visit: Payer: 59

## 2019-11-12 ENCOUNTER — Telehealth: Payer: Self-pay

## 2019-11-12 ENCOUNTER — Encounter: Payer: Self-pay | Admitting: Obstetrics & Gynecology

## 2019-11-12 DIAGNOSIS — R102 Pelvic and perineal pain: Secondary | ICD-10-CM

## 2019-11-12 DIAGNOSIS — N9489 Other specified conditions associated with female genital organs and menstrual cycle: Secondary | ICD-10-CM

## 2019-11-12 DIAGNOSIS — N950 Postmenopausal bleeding: Secondary | ICD-10-CM

## 2019-11-12 NOTE — Telephone Encounter (Signed)
Suzanna Obey [facility] Hi Dr. Sabra Heck, I hope you had a nice vacation. I have kept notes everyday since the biopsy on blood flow, color and cramping. I have it summarized. If you or Caitlin (sp) would please contact me, I would like you to know how things are as of today. And, would like to know if it is time to go on the lower progesterone dosage? I really would like to speak to someone though. Just to let you know what's been happening. Thank you.

## 2019-11-13 NOTE — Telephone Encounter (Signed)
Spoke with patient. Patient states that she has not stopped having light spotting/bleeding since her EMB on 10/24/2019. Last week on 11/06/2019 began having cramping again. Bleeding has remained very light. Having intermittent mild cramping. Denies any heavy bleeding. Reports having white/yellow discharge which she reports she had before the biopsy. Patient is still taking Aygestin  Mg BID. Reports she was intrusted to decrease the Aygestin after she had gone 3 days without bleeding. As she has not stopped having spotting she is asking for recommendations on Aygestin. Advised will review with Dr.Miller and return call.

## 2019-11-14 ENCOUNTER — Telehealth: Payer: Self-pay | Admitting: Obstetrics & Gynecology

## 2019-11-14 NOTE — Telephone Encounter (Signed)
Pt needs a PUS.  Ok to schedule at [facility] as I think we are a couple of weeks out with appts.  If lining is thickened, she will need a hysteroscopy and D&C.  Please persist with the ultrasound if she waffles.  She has called multiple, multiple times.

## 2019-11-14 NOTE — Telephone Encounter (Signed)
Spoke with patient regarding benefits for scheduled ultrasound. Patient acknowledges understanding of information presented. Encounter closed.

## 2019-11-14 NOTE — Telephone Encounter (Signed)
Spoke with patient. Patient verbalizes understanding and is agreeable to schedule. PUS scheduled for 11/19/2019 at at 2:30 pm with 3 pm consult with Dr.Miller. Patient is aware to arrive 15 minutes early to appointment.  Routing to provider and will close encounter.

## 2019-11-18 ENCOUNTER — Encounter: Payer: Self-pay | Admitting: Obstetrics & Gynecology

## 2019-11-18 ENCOUNTER — Ambulatory Visit: Payer: Self-pay | Admitting: Obstetrics & Gynecology

## 2019-11-19 ENCOUNTER — Other Ambulatory Visit: Payer: Self-pay | Admitting: Obstetrics & Gynecology

## 2019-11-19 ENCOUNTER — Telehealth: Payer: Self-pay | Admitting: *Deleted

## 2019-11-19 ENCOUNTER — Other Ambulatory Visit: Payer: 59

## 2019-11-19 ENCOUNTER — Encounter: Payer: Self-pay | Admitting: Obstetrics & Gynecology

## 2019-11-19 NOTE — Telephone Encounter (Signed)
Routing to [place], Therapist, sports

## 2019-11-19 NOTE — Telephone Encounter (Signed)
Spoke with pt. Pt had sent mychart message on 7/12. Pt states having only mild cramping with light beige/brown vaginal discharge that she noticed yesterday. Pt states might be from stress right now with personal life stressors. Pt denies vaginal itching, odor, heavy bleeding or clots at this time. Pt states has not had any discharge or spotting in over a week. Pt only using toilet paper for protection and only changing once a day even overnight.  Pt calling to reschedule PUS.  PUS scheduled 11/28/19 at 330pm. Pt agreeable and verbalized understanding of date and time of appt. Reviewed Cancellation policy with pt.   Routing to Dr Sabra Heck for review.  Encounter closed.

## 2019-11-19 NOTE — Telephone Encounter (Signed)
Patient cancelled ultrasound and would like a call to reschedule.

## 2019-11-19 NOTE — Telephone Encounter (Signed)
Cassie [name]  P [facility] I got a call today that my dad is being discharged from physical rehab from the facility he is at in 48 hours. Terrible how they give no notice. I have to be there tomorrow to turn in all kind of things for him to apply for medicaid. I have literally been on the phone all day! I called to reschedule my appointment at 4:05, this afternoon, with no idea that you don't take calls past 4. I assumed phones would be answered until 5. Can I please reschedule? Without being penalized? Please let me know asap. Thank you

## 2019-11-28 ENCOUNTER — Ambulatory Visit (INDEPENDENT_AMBULATORY_CARE_PROVIDER_SITE_OTHER): Payer: 59

## 2019-11-28 ENCOUNTER — Ambulatory Visit (INDEPENDENT_AMBULATORY_CARE_PROVIDER_SITE_OTHER): Payer: 59 | Admitting: Obstetrics & Gynecology

## 2019-11-28 ENCOUNTER — Encounter: Payer: Self-pay | Admitting: Obstetrics & Gynecology

## 2019-11-28 ENCOUNTER — Other Ambulatory Visit: Payer: Self-pay

## 2019-11-28 ENCOUNTER — Other Ambulatory Visit: Payer: Self-pay | Admitting: Obstetrics & Gynecology

## 2019-11-28 VITALS — BP 120/74 | HR 68 | Resp 16

## 2019-11-28 DIAGNOSIS — N9489 Other specified conditions associated with female genital organs and menstrual cycle: Secondary | ICD-10-CM

## 2019-11-28 DIAGNOSIS — N8501 Benign endometrial hyperplasia: Secondary | ICD-10-CM | POA: Diagnosis not present

## 2019-11-28 DIAGNOSIS — N950 Postmenopausal bleeding: Secondary | ICD-10-CM

## 2019-11-28 DIAGNOSIS — Z86711 Personal history of pulmonary embolism: Secondary | ICD-10-CM | POA: Diagnosis not present

## 2019-11-28 DIAGNOSIS — Z6841 Body Mass Index (BMI) 40.0 and over, adult: Secondary | ICD-10-CM

## 2019-11-28 NOTE — Progress Notes (Signed)
59 y.o. G0P0000 Married White or Caucasian female here for pelvic ultrasound due to continued cramping and bleeding after endometrial biopsy.  Pt has hx of simple endometrial hyperplasia.  She has been on aygestin 68m bid and repeat endometrial biopsy on 617/2021 was negative for hyperplasia.  Since her in office biopsy, she has called multiple times with cramping or bleeding so ultrasound has been recommended.  She is accompanied by her husband.  No LMP recorded. Patient is postmenopausal.  Contraception: PMP bleeding  Findings:  UTERUS: 7.0 x 5.4 x 4.3cm  EMS:3.4 - 3.856m two echogenic foci noted, no feeder vessels noted ADNEXA: Left ovary:  2.1 x 0.8cm       Right ovary: 2.0 x 1.3cm.  Both ovaries are atrophic in apearance CUL DE SAC:  No free fluid  Discussion:  Findings reviewed with pt.  Hysteroscopy with possible polyp resection, D&C recommended.  She wants a hysterectomy.  Pt has morbid obesity with BMI 54 and h/o PE and is on Eliquis.  From a safety standpoint, hysteroscopy with resection of any possible polyps with D&C and placement of Mirena IUD recommended.  Of course, she could continue the norethindrone but she continues to have cramping and bleeding so I feel she should make a different decision about treatment.  She desires stopping her progesterone to see if the bleeding will stop.  I think she will continue to having cramping/bleeding but am ok with her stopping this for the next month and seeing what happens.  Pt knows, very clearly, if continues to have cramping or bleeding, I am going to again recommend hysteroscopy and D&C.  Pt voices clear understanding.     Assessment:  Simple endometrial hyperplasia in pt with morbid obesity, BMI 54101/o PE, on eliquis  Plan:  H/o simple endometrial hyperplasia with PMP bleeding diagnosed 04/11/2019, has been on norethindrone 46m40mID.  Repeat negative biopsy 10/24/2019 She will stop the progesterone to see if this helps her  bleeding/cramping.  If not, will recommend proceeding with hysteroscopy, possible polyp resection, D&C and possible IUD placement  ~17 minutes spent with pt and spouse reviewing images, discussing options for treatment.

## 2019-12-01 ENCOUNTER — Encounter: Payer: Self-pay | Admitting: Obstetrics & Gynecology

## 2019-12-02 ENCOUNTER — Encounter: Payer: Self-pay | Admitting: Family

## 2019-12-02 ENCOUNTER — Telehealth: Payer: Self-pay | Admitting: *Deleted

## 2019-12-02 NOTE — Telephone Encounter (Signed)
Reviewed with Dr. Sabra Heck, hysteroscopy and D&C was discussed and recommended on 11/28/19.   Confirmed with patient, bleeding is light to spotting. Call returned to patient, advised per Dr. Sabra Heck. Patient is agreeable to proceed with procedure, but is unsure when she can schedule, is currently trying to coordinate care for her father.   1. Patient is asking if she needs to continue norethindrone for now until surgery is scheduled?   2. If she proceeds with Mirena IUD placement during surgery, should she still expect bleeding and cramping after?    Advised I will forward concerns to Dr. Sabra Heck to review. Our office will f/u with recommendations.   Routing to Dr. Sabra Heck  Cc: Reesa Chew, RN

## 2019-12-02 NOTE — Telephone Encounter (Signed)
Cassie [name], [facility] I stopped the progesterone Thursday night. I am bleeding again. Not just the different colors, textures amounts throughout this almost 6 weeks. But now blood again, fresh, not heavy, but more than what I've had since this first began. Cramping is horrible!!! Have a lot of family issues I am having to handle and having a hard time accomplishing anything because of this pain. Took progesterone again this evening, hoping it will help, although I've had all kinds and colors of discharge during the past 6 weeks and the horrible cramping, while on the progesterone. Grasping for straws here. Thought everything was ending a couple of weeks ago, but that didn't happen. We really have to find the cause of this and get me well. I can't handle this. So perplexed, never had anything happen after the previous 2 biopsies. And, no polyps mentioned after the ultrasound from [facility] in December. Getting concerned on what is causing all of this and why it doesn't stop. Please get in touch. Thank you

## 2019-12-03 NOTE — Telephone Encounter (Signed)
Spoke with patient, patient is unavailable, she will return call later today.

## 2019-12-03 NOTE — Telephone Encounter (Signed)
Spoke with patient, advised as seen below per Dr. Sabra Heck.  Patient request to schedule OV for consult prior to surgery. OV scheduled for 12/05/19 at 3:30pm. Patient currently taking norethindrone 10 mg at hs daily. Advised ok to continue as taking. Will provide update to Dr. Sabra Heck. Patient verbalizes understanding and is agreeable.   Routing to provider for final review. Patient is agreeable to disposition. Will close encounter.

## 2019-12-03 NOTE — Telephone Encounter (Signed)
1)  I would restart the norethindrone 27m.  Ok to restart at BID dosing as well. 2)  With doing a D&C prior to IUD placement, she would have some light spotting for a few days after the procedure but wouldn't have prolonged irregular bleeding like typically occurs after IUD placement.  This is because the endometrial lining with be thin form the D&C.  Routing to [employer] well.  She has endometrial hyperplasia and morbid obesity.  IUD will need to come from hospital pharmacy for surgery.

## 2019-12-05 ENCOUNTER — Encounter: Payer: Self-pay | Admitting: Obstetrics & Gynecology

## 2019-12-05 ENCOUNTER — Ambulatory Visit: Payer: 59 | Admitting: Obstetrics & Gynecology

## 2019-12-05 ENCOUNTER — Other Ambulatory Visit: Payer: Self-pay

## 2019-12-05 VITALS — BP 118/64 | HR 68 | Resp 16

## 2019-12-05 DIAGNOSIS — N950 Postmenopausal bleeding: Secondary | ICD-10-CM

## 2019-12-05 DIAGNOSIS — N9489 Other specified conditions associated with female genital organs and menstrual cycle: Secondary | ICD-10-CM

## 2019-12-05 DIAGNOSIS — N8501 Benign endometrial hyperplasia: Secondary | ICD-10-CM | POA: Diagnosis not present

## 2019-12-05 NOTE — Progress Notes (Signed)
GYNECOLOGY  VISIT  CC:   Discuss possible procedure  HPI: 60 y.o. G0P0000 Married White or Caucasian female here for surgical consult.  She has hx of simple endometrial hyperplasia that is being treated with norethindrone.  Repeat endometrial biopsy was done about a month ago and was negative.  Since that time, she's continued to have intermittent bleeding and spotting.  She has used OTC treatments for cramping.  She underwent an ultrasound last week showing two echogenic foci.  Hysteroscopy with resection of these lesions, D&C recommended.  Pt really wants a hysterectomy.  We've reviewed all the reasons not to proceed with something as invasive/aggressive as a hysterectomy.  She also thought possibly stopping her norethindrone would help.  I agreed to let her try this for one month but if bleeding or cramping continued, I would again recommend hysteroscopy.  Her bleeding is bright red and slightly more.  She called again with this and, again, I have recommended  Hysteroscopy with resection of endometrial lesions with D&C.  At the ultrasound appt, we also discussed possibly placing a Mirena IUD as well and stopping the oral progesterone.  She has thought about this some more and she had several questions.  Her husband is with her today.  Procedure discussed with patient.  We discussed this at the last visit but she has many questions so this was all reviewed again.  Specific risks discussed including but not limited to bleeding, rare risk of transfusion, infection, 1% risk of uterine perforation with risks of fluid deficit causing cardiac arrythmia, cerebral swelling and/or need to stop procedure early.  Fluid emboli and rare risk of death discussed.  DVT/PE, rare risk of risk of bowel/bladder/ureteral/vascular injury.  Patient aware if pathology abnormal she may need additional treatment.  I specifically addressed her questions as well.  She has decided she does not want a Mirena IUD placed at this  time.  GYNECOLOGIC HISTORY: No LMP recorded. Patient is postmenopausal. Contraception: post menopausal Menopausal hormone therapy: norethindrone   Patient Active Problem List   Diagnosis Date Noted   Head pain 10/25/2019   Elevated LFTs 10/25/2019   Aortic atherosclerosis ([place]) 08/22/2019   NSVT (nonsustained ventricular tachycardia) ([place]) 07/10/2019   Right lower quadrant abdominal pain 07/08/2019   Simple endometrial hyperplasia 06/23/2019   Anticoagulated 06/23/2019   Postmenopausal bleeding 04/03/2019   Cough variant asthma vs UACS from gerd 01/01/2018   PAC (premature atrial contraction) 08/17/2017   PVC (premature ventricular contraction) 08/17/2017   PAT (paroxysmal atrial tachycardia) ([place]) 08/17/2017   Leg edema 05/19/2017   Other fatigue 12/10/2016   Myocardial bridge 12/10/2016   Coronary artery calcification 12/10/2016   History of pulmonary embolism 12/10/2016   GERD (gastroesophageal reflux disease) 07/12/2016   Dyspnea on exertion 05/31/2016   Chronic venous insufficiency 05/31/2016   UC (ulcerative colitis) ([place]) 01/28/2015   Routine general medical examination at a health care facility 06/27/2014   Left medial knee pain 07/17/2013   Adrenal incidentaloma ([place]) 03/25/2013   Allergic rhinitis 09/13/2011   B12 deficiency 11/28/2008   Vitamin D deficiency 06/30/2008   Palpitations 06/30/2008   Morbid obesity ([place]) 10/15/2007   Anxiety about health 08/23/2007   Cardiac dysrhythmia 08/23/2007    Past Medical History:  Diagnosis Date   Adrenal nodule ([place])    Allergy    Anxiety    Arthritis    knees,thumbs   Cardiac arrhythmia    palpitations,PVC'sAC   Cataract    bilateral removed    Cyst of  kidney, acquired    cyst right kidney- benign   Degenerative disc disease, lumbar    Edema    Gallstones    History of anemia    20 yrs. ago not now- pt denies    Morbid obesity ([place])    Osteoarthritis    Other  allergy, other than to medicinal agents    Panic disorder    Pulmonary embolism ([place])    Run of atrial premature complexes    Ulcerative colitis, unspecified    Vitamin B12 deficiency    Vitamin D deficiency     Past Surgical History:  Procedure Laterality Date   BREAST BIOPSY  11/2011   Left- benign   CATARACT EXTRACTION Right    CATARACT EXTRACTION Left    CHOLECYSTECTOMY N/A 01/17/2014   Procedure: LAPAROSCOPIC CHOLECYSTECTOMY;  Surgeon: Excell Seltzer, MD;  Location: [facility];  Service: General;  Laterality: N/A;   COLONOSCOPY     RETINAL LASER PROCEDURE Left    SIGMOIDOSCOPY      MEDS:   Current Outpatient Medications on File Prior to Visit  Medication Sig Dispense Refill   acetaminophen (TYLENOL) 325 MG tablet Take 650 mg by mouth every 6 (six) hours as needed.     ALPRAZolam (XANAX) 1 MG tablet TAKE 1/2 TO 1 TABLET BY MOUTH 2 TIMES DAILY AS NEEDED FOR ANXIETY. 60 tablet 3   Ascorbic Acid (VITAMIN C PO) Take by mouth daily as needed.     atenolol (TENORMIN) 25 MG tablet Take 0.5 tablets (12.5 mg total) by mouth 2 (two) times daily. 90 tablet 2   Cholecalciferol (VITAMIN D3) 2000 units capsule Take 2,000 Units by mouth as needed.      ELIQUIS 5 MG TABS tablet TAKE 1 TABLET BY MOUTH TWICE DAILY 60 tablet 6   loperamide (IMODIUM A-D) 2 MG tablet Take 2 mg by mouth 4 (four) times daily as needed for diarrhea or loose stools.     norethindrone (AYGESTIN) 5 MG tablet TAKE 2 TABLETS(10 MG) BY MOUTH DAILY 60 tablet 5   Polyethyl Glycol-Propyl Glycol (SYSTANE OP) Place 1 drop into both eyes daily as needed (dry eyes).     Probiotic Product (PROBIOTIC DAILY PO) Take 2 capsules by mouth daily with lunch.      Simethicone (GAS-X PO) Take 1 tablet by mouth 4 (four) times daily as needed (For gas.). Do not exceed 6 tablets in a 24 hour period.     vitamin B-12 (CYANOCOBALAMIN) 500 MCG tablet Take 500 mcg by mouth as needed.      No current facility-administered  medications on file prior to visit.    ALLERGIES: Doxycycline, Hydrocodone, Sudafed [pseudoephedrine hcl], Acular [ketorolac tromethamine], Diphenhydramine hcl, Lactose intolerance (gi), Mesalamine, Sulfasalazine, Azithromycin, Caffeine, Penicillins, and Prednisone  Family History  Problem Relation Age of Onset   Heart disease Father 11       pacemaker   Atrial fibrillation Father    Hypertension Maternal Aunt    Hypertension Maternal Grandfather    Colon cancer Neg Hx    Stomach cancer Neg Hx    Colon polyps Neg Hx    Esophageal cancer Neg Hx    Rectal cancer Neg Hx     SH:  Married, non smoker  Review of Systems  Genitourinary: Positive for vaginal bleeding. Negative for vaginal discharge.       Pelvic cramping  Psychiatric/Behavioral: Negative.   All other systems reviewed and are negative.   PHYSICAL EXAMINATION:    BP (!) 118/64  Pulse 68    Resp 16     General appearance: alert, cooperative and appears stated age No other exam performed  Assessment: PMP bleeding Ultrasound showing two echogenic foci within the endometrium H/o simple endometrial hyperplasia Morbid obesity H/o PE, on eliquis   Plan: Will proceed with planning hysteroscopy with possible polyp resection, D&C.   33 minutes total spent with pt and her husband in discussion of above procedure, risks, and addressing her specific questions.

## 2019-12-06 ENCOUNTER — Telehealth: Payer: Self-pay

## 2019-12-06 NOTE — Telephone Encounter (Signed)
Cassie [name]  P [facility] Hi, I told you I always think of something after I leave. Blood thinners, would I have to off them before the D&C? And, what about my liver, if the progesterone causes my enzymes to continue to be high. I sure don't want liver problems.  Thanks for talking and listening today. Most of my doctors don't like the listening part.  [name]

## 2019-12-06 NOTE — Telephone Encounter (Signed)
Routing MyChart message to Dr. Sabra Heck to review.

## 2019-12-10 ENCOUNTER — Telehealth: Payer: Self-pay | Admitting: Obstetrics & Gynecology

## 2019-12-10 NOTE — Telephone Encounter (Signed)
Spoke with patient. Hysteroscopy D&C Myosure with possible polyp resection scheduled for 12/23/2019 at 0730 at [facility]. COVID testing scheduled for 12/19/2019 at 3:10 pm at [place] location. 2 week post op scheduled for 01/09/2020 at 4:30 pm with Dr.Miller. Patient is agreeable to all dates and times. Surgery instructions reviewed and mailed to patient's verified home address on file.  Routing to provider and will close encounter.

## 2019-12-10 NOTE — Telephone Encounter (Signed)
Spoke with patient regarding surgery benefits. Patient acknowledges understanding of information presented. Patient is aware that benefits presented are for professional benefits only. Patient is aware that once surgery is scheduled, the hospital will call with separate benefits. Patient is aware of surgery cancellation policy.  Routing call to [place], RN to discuss surgery scheduling options.

## 2019-12-12 ENCOUNTER — Ambulatory Visit: Payer: 59 | Admitting: Internal Medicine

## 2019-12-12 ENCOUNTER — Other Ambulatory Visit: Payer: Self-pay

## 2019-12-12 ENCOUNTER — Other Ambulatory Visit: Payer: Self-pay | Admitting: Obstetrics & Gynecology

## 2019-12-12 ENCOUNTER — Encounter: Payer: Self-pay | Admitting: Internal Medicine

## 2019-12-12 VITALS — BP 114/76 | HR 62 | Temp 98.4°F | Ht 65.0 in

## 2019-12-12 DIAGNOSIS — R1031 Right lower quadrant pain: Secondary | ICD-10-CM | POA: Diagnosis not present

## 2019-12-12 DIAGNOSIS — R7989 Other specified abnormal findings of blood chemistry: Secondary | ICD-10-CM | POA: Diagnosis not present

## 2019-12-12 NOTE — Assessment & Plan Note (Signed)
Recheck LFTs as here today and timing is correct.

## 2019-12-12 NOTE — Telephone Encounter (Signed)
Please let pt know I've reached out to Dr. Marin Olp about when to stop her eliquis.  She will receive a single Lovenox injection the day of the procedure for prevention of clot formation the day of surgery.  We discussed this in the office but she may not remember as she typically has a lot of questions.  Also, if pathology is negative after the procedure then I'm going to have her stop the progesterone for a few months and repeat the liver enzymes.  If this normalized, then we know it is the progesterone.  If it does not, then it is likely fatty liver disease causing the enzyme changes.  I do have a plan for her but we've got to do it one step at a time.  Thanks.

## 2019-12-12 NOTE — Telephone Encounter (Signed)
Left message to call Shreyansh Tiffany, RN at [facility] [phone].   

## 2019-12-12 NOTE — Assessment & Plan Note (Signed)
Not consistent with appendicitis today. No rebound or guarding. Suspect muscular from positioning in wheelchair and in car more with travel. CT abdomen/pelvis reviewed from March 2021 without acute problems or chronic appendix problems. Due for D and C with gyn in about 1-2 weeks and she will keep this.

## 2019-12-12 NOTE — Progress Notes (Signed)
° °  Subjective:   Patient ID: Cassie [name], female    DOB: 10-11-1960, 59 y.o.   MRN: [id number]  HPI The patient is a 59 YO female coming in for lower abdomen pain right sided. She is worried about her appendix. She has been using wheelchair more recently for going a lot of places to work on getting her father on [place]. She notices when moving around in the car or certain positions it hurts more. When sitting still does not hurt. Denies nausea or vomiting. Recent endometrial biopsy and had cramping and discharge and bleeding about 6 days afterwards. Is now free of that cramping and discharge for about last 3 days or so. Denies constipation or diarrhea or blood in stool.   Review of Systems  Constitutional: Negative.   HENT: Negative.   Eyes: Negative.   Respiratory: Negative for cough, chest tightness and shortness of breath.   Cardiovascular: Negative for chest pain, palpitations and leg swelling.  Gastrointestinal: Positive for abdominal pain. Negative for abdominal distention, constipation, diarrhea, nausea and vomiting.  Musculoskeletal: Negative.   Skin: Negative.   Neurological: Negative.   Psychiatric/Behavioral: Negative.     Objective:  Physical Exam Constitutional:      Appearance: She is well-developed. She is obese.  HENT:     Head: Normocephalic and atraumatic.  Cardiovascular:     Rate and Rhythm: Normal rate and regular rhythm.  Pulmonary:     Effort: Pulmonary effort is normal. No respiratory distress.     Breath sounds: Normal breath sounds. No wheezing or rales.  Abdominal:     General: Bowel sounds are normal. There is no distension.     Palpations: Abdomen is soft.     Tenderness: There is abdominal tenderness. There is no rebound.     Comments: Some diffuse tenderness right sided more lower, no rebound or guarding or mass  Musculoskeletal:     Cervical back: Normal range of motion.  Skin:    General: Skin is warm and dry.  Neurological:     Mental  Status: She is alert and oriented to person, place, and time.     Coordination: Coordination abnormal.     Comments: Wheelchair today for ambulation     Vitals:   12/12/19 0949  BP: 130/86  Pulse: 62  Temp: 98.4 F (36.9 C)  TempSrc: Oral  SpO2: 98%  Height: 5' 5"  (1.651 m)    This visit occurred during the SARS-CoV-2 public health emergency.  Safety protocols were in place, including screening questions prior to the visit, additional usage of staff PPE, and extensive cleaning of exam room while observing appropriate contact time as indicated for disinfecting solutions.   Assessment & Plan:

## 2019-12-16 ENCOUNTER — Encounter: Payer: 59 | Admitting: Internal Medicine

## 2019-12-17 ENCOUNTER — Encounter: Payer: Self-pay | Admitting: Internal Medicine

## 2019-12-17 NOTE — Telephone Encounter (Signed)
Cassie Salon, MD  Burnice Logan, RN Can you let pt know to stop two days before and restart the day after per Dr. Marin Olp.      ----- Message -----  From: Eliezer Bottom, NP  Sent: 12/13/2019  2:33 PM EDT  To: Cassie Salon, MD  Subject: RE: pre op eliquis                I checked with Laurey Arrow and he said all this sounds good. Have her stop the Eliquis 2 days before the procedure and restart the day after. Have a wonderful weekend!    From: Cassie Salon, MD  Sent: 12/12/2019  5:41 AM EDT  To: Eliezer Bottom, NP  Subject: pre op eliquis                  [name],  This pt needs a hysteroscopy with possible polyp resection, and D&C. She's having PMP bleeding and has hx of endometrial hyperplasia. She's morbidly obese and she's had a PE. Dr. Marin Olp sees her. I'm going to give her the DVT prophylaxis dose of Lovenox the day of the surgery. When does she need to stop her Eliquis. I don't think you need to see her pre-op but if you do, will you let me know? Thanks.

## 2019-12-17 NOTE — Telephone Encounter (Signed)
Spoke with patient. Advised per Dr. Sabra Heck.  Patient read back instructions for Eliquis.  Patient reports she has switched to taking Aygestin 5 mg bid instead of 10 mg daily, reports no cramping or spotting for 10 days.  Patient states she had CMP, CBC and estrogen labs with PCP on 8/5, results pending.   Questions answered. Advised patient I will provide update to Dr. Sabra Heck. Advised patient she is out of the office this week, wil return on 8/16. Will return call if any additional recommendations. Patient thankful for call.   Routing to provider for final review. Patient is agreeable to disposition. Will close encounter.

## 2019-12-19 ENCOUNTER — Encounter: Payer: Self-pay | Admitting: Obstetrics & Gynecology

## 2019-12-19 ENCOUNTER — Other Ambulatory Visit: Payer: Self-pay

## 2019-12-19 ENCOUNTER — Encounter (HOSPITAL_COMMUNITY): Payer: Self-pay

## 2019-12-19 ENCOUNTER — Encounter: Payer: Self-pay | Admitting: Family

## 2019-12-19 ENCOUNTER — Encounter (HOSPITAL_COMMUNITY)
Admission: RE | Admit: 2019-12-19 | Discharge: 2019-12-19 | Disposition: A | Discharge status: Home / Self Care | Payer: 59 | Source: Ambulatory Visit | Attending: Obstetrics & Gynecology | Admitting: Obstetrics & Gynecology

## 2019-12-19 ENCOUNTER — Encounter: Payer: Self-pay | Admitting: Internal Medicine

## 2019-12-19 ENCOUNTER — Other Ambulatory Visit (HOSPITAL_COMMUNITY)
Admission: RE | Admit: 2019-12-19 | Discharge: 2019-12-19 | Disposition: A | Discharge status: Home / Self Care | Payer: 59 | Source: Ambulatory Visit | Attending: Obstetrics & Gynecology | Admitting: Obstetrics & Gynecology

## 2019-12-19 DIAGNOSIS — I493 Ventricular premature depolarization: Secondary | ICD-10-CM | POA: Insufficient documentation

## 2019-12-19 DIAGNOSIS — Z01818 Encounter for other preprocedural examination: Secondary | ICD-10-CM | POA: Insufficient documentation

## 2019-12-19 DIAGNOSIS — R0600 Dyspnea, unspecified: Secondary | ICD-10-CM | POA: Insufficient documentation

## 2019-12-19 DIAGNOSIS — Z7901 Long term (current) use of anticoagulants: Secondary | ICD-10-CM | POA: Insufficient documentation

## 2019-12-19 DIAGNOSIS — G4733 Obstructive sleep apnea (adult) (pediatric): Secondary | ICD-10-CM | POA: Insufficient documentation

## 2019-12-19 DIAGNOSIS — Z6841 Body Mass Index (BMI) 40.0 and over, adult: Secondary | ICD-10-CM | POA: Insufficient documentation

## 2019-12-19 DIAGNOSIS — G4736 Sleep related hypoventilation in conditions classified elsewhere: Secondary | ICD-10-CM | POA: Insufficient documentation

## 2019-12-19 DIAGNOSIS — Z20822 Contact with and (suspected) exposure to covid-19: Secondary | ICD-10-CM | POA: Insufficient documentation

## 2019-12-19 DIAGNOSIS — Z79899 Other long term (current) drug therapy: Secondary | ICD-10-CM | POA: Insufficient documentation

## 2019-12-19 DIAGNOSIS — Q245 Malformation of coronary vessels: Secondary | ICD-10-CM | POA: Insufficient documentation

## 2019-12-19 DIAGNOSIS — F410 Panic disorder [episodic paroxysmal anxiety] without agoraphobia: Secondary | ICD-10-CM | POA: Insufficient documentation

## 2019-12-19 DIAGNOSIS — F419 Anxiety disorder, unspecified: Secondary | ICD-10-CM | POA: Insufficient documentation

## 2019-12-19 DIAGNOSIS — N950 Postmenopausal bleeding: Secondary | ICD-10-CM | POA: Insufficient documentation

## 2019-12-19 DIAGNOSIS — Z01812 Encounter for preprocedural laboratory examination: Secondary | ICD-10-CM | POA: Insufficient documentation

## 2019-12-19 DIAGNOSIS — N8501 Benign endometrial hyperplasia: Secondary | ICD-10-CM | POA: Insufficient documentation

## 2019-12-19 DIAGNOSIS — R002 Palpitations: Secondary | ICD-10-CM | POA: Insufficient documentation

## 2019-12-19 DIAGNOSIS — I2510 Atherosclerotic heart disease of native coronary artery without angina pectoris: Secondary | ICD-10-CM | POA: Insufficient documentation

## 2019-12-19 DIAGNOSIS — I700 Atherosclerosis of aorta: Secondary | ICD-10-CM | POA: Insufficient documentation

## 2019-12-19 DIAGNOSIS — I499 Cardiac arrhythmia, unspecified: Secondary | ICD-10-CM | POA: Insufficient documentation

## 2019-12-19 HISTORY — DX: Dyspnea, unspecified: R06.00

## 2019-12-19 LAB — COMPREHENSIVE METABOLIC PANEL
AG Ratio: 1.1 (calc) (ref 1.0–2.5)
ALT: 35 U/L — ABNORMAL HIGH (ref 6–29)
AST: 29 U/L (ref 10–35)
Albumin: 3.6 g/dL (ref 3.6–5.1)
Alkaline phosphatase (APISO): 46 U/L (ref 37–153)
BUN: 8 mg/dL (ref 7–25)
CO2: 23 mmol/L (ref 20–32)
Calcium: 8.8 mg/dL (ref 8.6–10.4)
Chloride: 106 mmol/L (ref 98–110)
Creat: 0.84 mg/dL (ref 0.50–0.99)
Globulin: 3.3 g/dL (calc) (ref 1.9–3.7)
Glucose, Bld: 102 mg/dL — ABNORMAL HIGH (ref 65–99)
Potassium: 5 mmol/L (ref 3.5–5.3)
Sodium: 139 mmol/L (ref 135–146)
Total Bilirubin: 0.5 mg/dL (ref 0.2–1.2)
Total Protein: 6.9 g/dL (ref 6.1–8.1)

## 2019-12-19 LAB — CBC
HCT: 44.1 % (ref 35.0–45.0)
Hemoglobin: 14.5 g/dL (ref 11.7–15.5)
MCH: 31.1 pg (ref 27.0–33.0)
MCHC: 32.9 g/dL (ref 32.0–36.0)
MCV: 94.6 fL (ref 80.0–100.0)
MPV: 11.5 fL (ref 7.5–12.5)
Platelets: 276 10*3/uL (ref 140–400)
RBC: 4.66 10*6/uL (ref 3.80–5.10)
RDW: 12.9 % (ref 11.0–15.0)
WBC: 7.9 10*3/uL (ref 3.8–10.8)

## 2019-12-19 LAB — SARS CORONAVIRUS 2 (TAT 6-24 HRS): SARS Coronavirus 2: NEGATIVE

## 2019-12-19 LAB — ESTROGENS, TOTAL: Estrogen: 284.6 pg/mL

## 2019-12-19 NOTE — Pre-Procedure Instructions (Signed)
Cassie [name]  12/19/2019    Your procedure is scheduled on Monday, December 23, 2019 at 7:30 AM.   Report to [facility] Entrance "A" Admitting Office at 5:30 AM.   Call this number if you have problems the morning of surgery: [phone]              Questions prior to day of surgery, please call [phone] between 8 & 4 PM.   Remember:  Do not eat or drink after midnight Sunday, 12/22/19.  Take these medicines the morning of surgery with A SIP OF WATER: Atenolol (Tenormin), Norethindrone (Aygestin), Alprazolam (Xanax) - if needed, Acetaminophen (Tylenol) - if needed, may use eye drops and nasal spray if needed  Stop Eliquis as instructed by physician (Last dose will be 12/20/19 pm dose). Do not use Aspirin containing products, NSAIDS (Ibuprofen, Aleve, etc), Herbal medications, Multivitamins or Fish Oil prior to surgery.    Do not wear jewelry, make-up or nail polish.  Do not wear lotions, powders, perfumes or deodorant.  Do not shave 48 hours prior to surgery.    Do not bring valuables to the hospital.  [facility] is not responsible for any belongings or valuables.  Contacts, dentures or bridgework may not be worn into surgery.  Leave your suitcase in the car.  After surgery it may be brought to your room.  For patients admitted to the hospital, discharge time will be determined by your treatment team.  Patients discharged the day of surgery will not be allowed to drive home.   [place] - Preparing for Surgery  Before surgery, you can play an important role.  Because skin is not sterile, your skin needs to be as free of germs as possible.  You can reduce the number of germs on you skin by washing with CHG (chlorahexidine gluconate) soap before surgery.  CHG is an antiseptic cleaner which kills germs and bonds with the skin to continue killing germs even after washing.  Oral Hygiene is also important in reducing the risk of infection.  Remember to brush your teeth with  your regular toothpaste the morning of surgery.  Please DO NOT use if you have an allergy to CHG or antibacterial soaps.  If your skin becomes reddened/irritated stop using the CHG and inform your nurse when you arrive at Short Stay.  Do not shave (including legs and underarms) for at least 48 hours prior to the first CHG shower.  You may shave your face.  Please follow these instructions carefully:   1.  Shower with CHG Soap the night before surgery and the morning of Surgery.  2.  If you choose to wash your hair, wash your hair first as usual with your normal shampoo.  3.  After you shampoo, rinse your hair and body thoroughly to remove the shampoo. 4.  Use CHG as you would any other liquid soap.  You can apply chg directly to the skin and wash gently with a      scrungie or washcloth.           5.  Apply the CHG Soap to your body ONLY FROM THE NECK DOWN.   Do not use on open wounds or open sores. Avoid contact with your eyes, ears, mouth and genitals (private parts).  Wash genitals (private parts) with your normal soap - do this prior to using CHG soap.  6.  Wash thoroughly, paying special attention to the area where your surgery will be performed.  7.  Thoroughly rinse your body with warm water from the neck down.  8.  DO NOT shower/wash with your normal soap after using and rinsing off the CHG Soap.  9.  Pat yourself dry with a clean towel.            10.  Wear clean pajamas.            11.  Place clean sheets on your bed the night of your first shower and do not sleep with pets.  Day of Surgery  Shower as above. Do not apply any lotions/deodorants the morning of surgery.   Please wear clean clothes to the hospital. Remember to brush your teeth with toothpaste.  Please read over the fact sheets that you were given.

## 2019-12-19 NOTE — Progress Notes (Signed)
PCP - Dr. Pricilla Holm Cardiologist - Dr. Harrell Gave ENd  EKG - today Stress Test - 01/01/04 ECHO - 08/16/19  Sleep Study - 11/01/17 CPAP - pt states it was negative  Blood Thinner Instructions: hold Eliquis 2 days prior to surgery per Dr. Marin Olp. Last dose will be 12/20/19 after PM dose  COVID TEST- today   Anesthesia review: yes, hx of palpitations, PVC's, shortness of breath (not new, onset about 4 years ago) pt concerned because she has not had any surgery since the sob started.   Patient denies shortness of breath (is normal for her), fever, cough and chest pain at PAT appointment  All instructions explained to the patient, with a verbal understanding of the material. Patient agrees to go over the instructions while at home for a better understanding. Patient also instructed to self quarantine after being tested for COVID-19. The opportunity to ask questions was provided.

## 2019-12-20 ENCOUNTER — Telehealth: Payer: Self-pay

## 2019-12-20 NOTE — Progress Notes (Signed)
Anesthesia Chart Review:  Case: [id number] Date/Time: 12/23/19 0715   Procedure: DILATATION & CURETTAGE/HYSTEROSCOPY WITH MYOSURE/POSS POLYP RESECTION (N/A ) - possible polyp resection   Anesthesia type: Choice   Pre-op diagnosis: Postmenopausal bleeding, Simple endometrial hyperplasia without atypia   Location: [facility] OR ROOM 02 / [place] OR   Surgeons: Megan Salon, MD      DISCUSSION: Patient is a 59 year old female scheduled for the above procedure.   History includes former smoker (03/07/79), palpitations (PVCs, PACs), dyspnea, anxiety with panic disorder, bilateral adrenal adenomas (normal plasma metanephrines 05/05/17), renal cyst (left 07/11/19), ulcerative colitis, PE (07/04/16), edema, cholecystectomy (01/17/14). She had Coronary CT/FFR in 7/20219 that showed mid LAD with a long 12 mm intramyocardial bridge, borderline FFR 0.8 in the very distal LAD, but no significant blockages, and medical therapy recommended. BMI is consistent with morbid obesity.   Dr. Sabra Heck reached out to hematology regarding perioperative anticoagulation. Patient to "stop the Eliquis 2 days before the procedure and restart the day after." Dr. Sabra Heck also add that "I'm going to give her the DVT prophylaxis dose of Lovenox the day of the surgery."   Last evaluation by cardiologist Dr. Saunders Revel on 08/22/19. No chest pain. She has had exertional dyspnea for ~ 4 year with evaluations by cardiology and pulmonology. By notes, patient reported some progression of DOE ~ 05/2019. Echo and event monitor repeated. Echo with normal LVEF, grade 1 diastolic dysfunction, normal RVSP, no significant valvular disease. Event monitor without sustained arrhythmia (rare PVCs, PACs, 5 beat NSVT, brief PSVT). B-blocker titration limited by relative bradycardia, but symptoms improved with switching to another brand manufacturer. If symptoms should process then he would consider EP consultation--but not recommended at that time.  2018 coronary CT/FFR showed LAD  myocardial bridge, but otherwise no significant CAD. She remained on Eliquis for 2018 PE history. He felt dyspnea "primarily drive by morbid obesity and deconditioning" and encouraged her to work on weight loss and increasing her activity as tolerated. Six month follow-up planned.   She had CBC and CMET per PCP Dr. Sharlet Salina on 12/12/19. She denied any new cardiopulmonary symptoms since her April evaluation by cardiology. EKG stable with SB at 59 bpm.  12/19/19 presurgical COVID-19 test negative.  Anesthesia team to evaluate on the day of surgery.   VS: BP (!) 144/68   Pulse 63   Temp 36.9 C (Oral)   Resp 17   Ht 5' 5"  (1.651 m)   Wt (!) 148.8 kg   SpO2 99%   BMI 54.58 kg/m     PROVIDERS: Hoyt Koch, MD is PCP. Last evaluation 12/12/19, and she is aware of surgery plans.   Nelva Bush, MD is cardiologist. Last evaluation on 08/22/19.  Burney Gauze, MD is HEM. Last evaluation 08/05/19 by Laverna Peace, NP.  Christinia Gully, MD is pulmonologist. Referred by Dr. Saunders Revel for DOE and cough. Evaluated on 01/02/18. He wrote, "Symptoms markedly disproportionate to objective findings and not clear to what extent this is actually a pulmonary problem..." Trial with PPI to see if GERD contributing.  Zenovia Jarred, MD is GI. Last evaluation 12/04/17.    LABS: She had labs on 12/12/19. Results included: Lab Results  Component Value Date   WBC 7.9 12/12/2019   HGB 14.5 12/12/2019   HCT 44.1 12/12/2019   PLT 276 12/12/2019   GLUCOSE 102 (H) 12/12/2019   ALT 35 (H) 12/12/2019   AST 29 12/12/2019   NA 139 12/12/2019   K 5.0 12/12/2019  CL 106 12/12/2019   CREATININE 0.84 12/12/2019   BUN 8 12/12/2019   CO2 23 12/12/2019   TSH 3.91 07/30/2019   HGBA1C 5.7 06/14/2018     OTHER: Overnight pulse oximetry 05/17/18: Time spent at or below 88%: 00:01:08 Mean Saturation: 93.59% Total Desaturations: 5 DEI: 1.7 Medicare Group 1 Criteria is an arterial oxygen saturation at or below 88% for at  least 5 minutes taken during sleep.  Sleep Study 09/19/17:  IMPRESSIONS - Mild obstructive sleep apnea occurred during this study (AHI = 6.9/h). - No significant central sleep apnea occurred during this study (CAI = 0.0/h). - Moderate oxygen desaturation was noted during this study (Min O2 = 81%). - Patient snored 9.5% during the sleep. DIAGNOSIS - Obstructive Sleep Apnea (327.23 [G47.33 ICD-10]) - Nocturnal Hypoxemia (327.26 [G47.36 ICD-10])  Per pulmonology notes by Dr. Melvyn Novas: "- PFT's   08/03/16  FEV1 2.28 (83 % ) ratio 75  p 10 % improvement from saba p nothing prior to study with DLCO  87/87c % corrects to 99 % for alv volume and ERV 17% c/w obesity effects  - 01/01/2018   Walked RA  2 laps @ 185 ft each stopped due to  Cough/fatigue > sob with nl sats at moderate pace"   IMAGES: CT abd/pelvis 07/11/19: IMPRESSION: 1. New inflammatory changes of the skin and subcutaneous fat of the panniculus below the umbilicus. This is consistent with panniculitis. No other acute abnormality of the abdomen or pelvis. 2. Tiny bilateral adrenal adenomas, stable. 3. Slight aortic atherosclerosis.   EKG: 12/19/19: Sinus bradycardia at 59 bpm Interpretation limited secondary to artifact Low voltage QRS Otherwise normal ECG Compared to previous tracing dated 01/05/2017  Non-specific intra-ventricular conduction delay NO LONGER PRESENT RSR prime Inferolateral Limb Leads (II, III, aVF & aVL) NO LONGER PRESENT Possible Left atrial enlargement NO LONGER PRESENT Cannot rule out Septal infarct , age undetermined NO LONGER PRESENT  Confirmed By: Glenetta Hew   CV: Echo 08/16/19: IMPRESSIONS  1. Left ventricular ejection fraction, by estimation, is 60 to 65%. The  left ventricle has normal function. The left ventricle has no regional  wall motion abnormalities. Left ventricular diastolic parameters are  consistent with Grade I diastolic  dysfunction (impaired relaxation).  2. Right ventricular  systolic function is normal. The right ventricular  size is normal. Tricuspid regurgitation signal is inadequate for assessing  PA pressure.  3. Left atrial size was mildly dilated.    Long term monitor 06/02/19-06/16/19: Study Highlights  The patient was monitored for 13 days, 23 hours.  The predominant rhythm was sinus with an average rate of 54 bpm (range 43 to 102 bpm in sinus).  There were rare PACs and PVCs.  A single 5 beat run of ventricular tachycardia occurred with a maximum rate of 150 bpm. This was immediately followed by a short episode of accelerated idioventricular rhythm.  19 episodes of PSVT occurred, lasting up to 19.3 seconds with a maximum rate of 150 bpm.  There was no sustained arrhythmia or prolonged pause.  Patient triggered events correspond to sinus rhythm, sinus rhythm with PACs, sinus rhythm with PVCs, PSVT, and NSVT/AIVR. Predominantly sinus rhythm with rare PACs and PVCs as well as brief PSVT and a single 5 beat run of NSVT followed by transient accelerated idioventricular rhythm.   CT Coronary 11/25/16 with FFR: - Aorta:  Normal size.  No calcifications.  No dissection. - Aortic Valve:  Trileaflet.  No calcifications. - Coronary Arteries:  Normal coronary origin.  Right  dominance. - RCA is a large dominant artery that gives rise to PDA and PLVB. There is minimal calcified in RCA. PDA is of very small caliber. - Left main is a large long artery that gives rise to LAD and LCX arteries. There is no plaque in LM artery. - LAD is a long vessel that wraps around the apex and gives rise to one small diagonal branch. There is minimal calcified plaque in the proximal LAD associated with 0-25% stenosis. Mid LAD has a long 12 mm intramyocardial bridge. There is no plaque in the mid or distal LAD. Distal LAD gas fairly narrow lumen. - LCX is a small non-dominant artery that has no plaque. - Other findings: Normal pulmonary vein drainage into the left  atrium. Normal let atrial appendage without a thrombus. Normal size of the pulmonary artery. IMPRESSION: 1. Coronary calcium score of 54. This was 46 percentile for age and sex matched control. 2. Normal coronary origin with right dominance. 3. Mild non-obstructive CAD in RCA and LAD. Mid LAD has a 12 mm intramyocardial bridge. We will send CT FFR analysis for evaluation of functional significance of the bridge. 4. Normal size of the pulmonary artery. FFR: 1. LM:  Normal FFR 2. LAD: Normal FFR in the proximal and mid LAD, FFR 0.8 in the very distal portion of LAD. 3. LCX:  Normal FFR 4. RCA:  Normal FFR IMPRESSION: 1.  Borderline FFR 0.8 in the very distal LAD. - Reviewed by Dr. Saunders Revel on 11/27/16, "Please let Ms. Milbourn know that her chest CT does not show any significant atherosclerotic blockages. A myocardial bridge was noted, which represents a portion of her LAD that is sometimes compressed by the adjacent heart muscle. I am not sure that this alone would explain her shortness of breath. We will discuss this finding and medical treatment options when she returns to see me. In the meantime, I recommend that she continue her current medications."  Past Medical History:  Diagnosis Date  . Adrenal nodule ([place])   . Allergy   . Anemia    in her early twenties  . Anxiety   . Arthritis    knees,thumbs  . Cardiac arrhythmia    palpitations,PVC'sAC  . Cataract    bilateral removed   . Cyst of kidney, acquired    cyst right kidney- benign  . Degenerative disc disease, lumbar   . Dyspnea   . Edema   . Gallstones   . History of anemia    20 yrs. ago not now- pt denies   . Morbid obesity ([place])   . Osteoarthritis   . Other allergy, other than to medicinal agents   . Panic disorder   . Pulmonary embolism ([place])   . Run of atrial premature complexes   . Ulcerative colitis, unspecified   . Vitamin B12 deficiency   . Vitamin D deficiency     Past Surgical History:  Procedure  Laterality Date  . BREAST BIOPSY  11/2011   Left- benign  . CATARACT EXTRACTION Right   . CATARACT EXTRACTION Left   . CHOLECYSTECTOMY N/A 01/17/2014   Procedure: LAPAROSCOPIC CHOLECYSTECTOMY;  Surgeon: Excell Seltzer, MD;  Location: [facility] ORS;  Service: General;  Laterality: N/A;  . COLONOSCOPY    . RETINAL LASER PROCEDURE Left   . SIGMOIDOSCOPY      MEDICATIONS: . acetaminophen (TYLENOL) 500 MG tablet  . ALPRAZolam (XANAX) 1 MG tablet  . atenolol (TENORMIN) 25 MG tablet  . ELIQUIS 5 MG TABS tablet  .  loperamide (IMODIUM A-D) 2 MG tablet  . norethindrone (AYGESTIN) 5 MG tablet  . Polyethylene Glycol 400 (VISINE DRY EYE RELIEF OP)  . Simethicone (GAS-X PO)  . sodium chloride (OCEAN) 0.65 % SOLN nasal spray   No current facility-administered medications for this encounter.    Myra Gianotti, PA-C Surgical Short Stay/Anesthesiology [facility] Phone [phone] [facility] Phone [phone] 12/20/2019 10:23 AM

## 2019-12-20 NOTE — Anesthesia Preprocedure Evaluation (Addendum)
Anesthesia Evaluation  Patient identified by MRN, date of birth, ID band Patient awake    Reviewed: Allergy & Precautions, NPO status , Patient's Chart, lab work & pertinent test results  History of Anesthesia Complications Negative for: history of anesthetic complications  Airway Mallampati: II  TM Distance: >3 FB Neck ROM: Full    Dental  (+) Dental Advisory Given   Pulmonary shortness of breath, asthma , former smoker,    breath sounds clear to auscultation       Cardiovascular + CAD   Rhythm:Regular  Echo 08/16/19: 1. Left ventricular ejection fraction, by estimation, is 60 to 65%. The  left ventricle has normal function. The left ventricle has no regional  wall motion abnormalities. Left ventricular diastolic parameters are  consistent with Grade I diastolic  dysfunction (impaired relaxation).  2. Right ventricular systolic function is normal. The right ventricular  size is normal. Tricuspid regurgitation signal is inadequate for assessing  PA pressure.  3. Left atrial size was mildly dilated.    Long term monitor 06/02/19-06/16/19: Predominantly sinus rhythm with rare PACs and PVCs as well as brief PSVT and a single 5 beat run of NSVT followed by transient accelerated idioventricular rhythm.   CT Coronary 11/25/16 with FFR: IMPRESSION: 1. Coronary calcium score of 54. This was 73 percentile for age and sex matched control. 2. Normal coronary origin with right dominance. 3. Mild non-obstructive CAD in RCA and LAD. Mid LAD has a 12 mm intramyocardial bridge. We will send CT FFR analysis for evaluation of functional significance of the bridge. 4. Normal size of the pulmonary artery. FFR: 1. LM:  Normal FFR 2. LAD: Normal FFR in the proximal and mid LAD, FFR 0.8 in the very distal portion of LAD. 3. LCX:  Normal FFR 4. RCA:  Normal FFR IMPRESSION: 1.  Borderline FFR 0.8 in the very distal LAD.    Neuro/Psych   Headaches, PSYCHIATRIC DISORDERS Anxiety  Neuromuscular disease    GI/Hepatic PUD, GERD  ,  Endo/Other  Morbid obesity  Renal/GU Renal disease     Musculoskeletal   Abdominal   Peds  Hematology   Anesthesia Other Findings History includes former smoker (03/07/79), palpitations (PVCs, PACs), dyspnea, anxiety with panic disorder, bilateral adrenal adenomas (normal plasma metanephrines 05/05/17), renal cyst (left 07/11/19), ulcerative colitis, PE (07/04/16), edema, cholecystectomy (01/17/14). She had Coronary CT/FFR in 7/20219 that showed mid LAD with a long 12 mm intramyocardial bridge, borderline FFR 0.8 in the very distal LAD, but no significant blockages, and medical therapy recommended. BMI is consistent with morbid obesity.   Dr. Sabra Heck reached out to hematology regarding perioperative anticoagulation. Patient to "stop the Eliquis 2 days before the procedure and restart the day after." Dr. Sabra Heck also add that "I'm going to give her the DVT prophylaxis dose of Lovenox the day of the surgery."   Last evaluation by cardiologist Dr. Saunders Revel on 08/22/19. No chest pain. She has had exertional dyspnea for ~ 4 year with evaluations by cardiology and pulmonology. By notes, patient reported some progression of DOE ~ 05/2019. Echo and event monitor repeated. Echo with normal LVEF, grade 1 diastolic dysfunction, normal RVSP, no significant valvular disease. Event monitor without sustained arrhythmia (rare PVCs, PACs, 5 beat NSVT, brief PSVT). B-blocker titration limited by relative bradycardia, but symptoms improved with switching to another brand manufacturer. If symptoms should process then he would consider EP consultation--but not recommended at that time.  2018 coronary CT/FFR showed LAD myocardial bridge, but otherwise no significant CAD. She  remained on Eliquis for 2018 PE history. He felt dyspnea "primarily drive by morbid obesity and deconditioning" and encouraged her to work on weight loss and  increasing her activity as tolerated. Six month follow-up planned.   Reproductive/Obstetrics                           Anesthesia Physical Anesthesia Plan  ASA: III  Anesthesia Plan: General   Post-op Pain Management:    Induction: Intravenous  PONV Risk Score and Plan: 3 and Ondansetron and Dexamethasone  Airway Management Planned: Oral ETT  Additional Equipment: None  Intra-op Plan:   Post-operative Plan: Extubation in OR  Informed Consent: I have reviewed the patients History and Physical, chart, labs and discussed the procedure including the risks, benefits and alternatives for the proposed anesthesia with the patient or authorized representative who has indicated his/her understanding and acceptance.     Dental advisory given  Plan Discussed with: CRNA and Surgeon  Anesthesia Plan Comments: (PAT note written 12/20/2019 by Myra Gianotti, PA-C. )       Anesthesia Quick Evaluation

## 2019-12-20 NOTE — Telephone Encounter (Signed)
Pt sent following Mychart message:  Cassie [name], [place], Cassie [name] Yesterday (10:59 AM)   Hi, had labs drawn at Dr. Nathanial Millman last week. The results are in. Wanted to let you know so you could take a look. She ran an estrogen count, out of my and her curiosity. Wow! The count came back high. Maybe you'd know why so high? [name]  Routing to Dr Sabra Heck for review before surgery scheduled on 12/23/19. Encounter closed.

## 2019-12-23 ENCOUNTER — Other Ambulatory Visit: Payer: Self-pay

## 2019-12-23 ENCOUNTER — Encounter (HOSPITAL_COMMUNITY): Payer: Self-pay | Admitting: Obstetrics & Gynecology

## 2019-12-23 ENCOUNTER — Encounter (HOSPITAL_COMMUNITY)
Admission: RE | Disposition: A | Discharge status: Home / Self Care | Payer: Self-pay | Source: Home / Self Care | Attending: Obstetrics & Gynecology

## 2019-12-23 ENCOUNTER — Ambulatory Visit (HOSPITAL_COMMUNITY)
Admission: RE | Admit: 2019-12-23 | Discharge: 2019-12-23 | Disposition: A | Discharge status: Home / Self Care | Payer: 59 | Attending: Obstetrics & Gynecology | Admitting: Obstetrics & Gynecology

## 2019-12-23 ENCOUNTER — Ambulatory Visit (HOSPITAL_COMMUNITY): Payer: 59

## 2019-12-23 ENCOUNTER — Ambulatory Visit (HOSPITAL_COMMUNITY): Payer: 59 | Admitting: Vascular Surgery

## 2019-12-23 ENCOUNTER — Encounter: Payer: Self-pay | Admitting: Obstetrics & Gynecology

## 2019-12-23 DIAGNOSIS — Z86711 Personal history of pulmonary embolism: Secondary | ICD-10-CM | POA: Insufficient documentation

## 2019-12-23 DIAGNOSIS — I2510 Atherosclerotic heart disease of native coronary artery without angina pectoris: Secondary | ICD-10-CM | POA: Insufficient documentation

## 2019-12-23 DIAGNOSIS — N950 Postmenopausal bleeding: Secondary | ICD-10-CM | POA: Insufficient documentation

## 2019-12-23 DIAGNOSIS — Z79899 Other long term (current) drug therapy: Secondary | ICD-10-CM | POA: Insufficient documentation

## 2019-12-23 DIAGNOSIS — N8501 Benign endometrial hyperplasia: Secondary | ICD-10-CM | POA: Insufficient documentation

## 2019-12-23 DIAGNOSIS — Z7901 Long term (current) use of anticoagulants: Secondary | ICD-10-CM | POA: Insufficient documentation

## 2019-12-23 DIAGNOSIS — Z87891 Personal history of nicotine dependence: Secondary | ICD-10-CM | POA: Diagnosis not present

## 2019-12-23 DIAGNOSIS — Z6841 Body Mass Index (BMI) 40.0 and over, adult: Secondary | ICD-10-CM | POA: Insufficient documentation

## 2019-12-23 DIAGNOSIS — F410 Panic disorder [episodic paroxysmal anxiety] without agoraphobia: Secondary | ICD-10-CM | POA: Insufficient documentation

## 2019-12-23 DIAGNOSIS — N858 Other specified noninflammatory disorders of uterus: Secondary | ICD-10-CM | POA: Insufficient documentation

## 2019-12-23 HISTORY — PX: DILATATION & CURETTAGE/HYSTEROSCOPY WITH MYOSURE: SHX6511

## 2019-12-23 LAB — PROTIME-INR
INR: 1.2 (ref 0.8–1.2)
Prothrombin Time: 14.3 seconds (ref 11.4–15.2)

## 2019-12-23 SURGERY — DILATATION & CURETTAGE/HYSTEROSCOPY WITH MYOSURE
Anesthesia: General | Site: Vagina

## 2019-12-23 MED ORDER — LIDOCAINE-EPINEPHRINE 1 %-1:100000 IJ SOLN
INTRAMUSCULAR | Status: DC | PRN
  Administered 2019-12-23: 10 mL

## 2019-12-23 MED ORDER — FENTANYL CITRATE (PF) 250 MCG/5ML IJ SOLN
INTRAMUSCULAR | Status: AC
  Filled 2019-12-23: qty 5

## 2019-12-23 MED ORDER — ORAL CARE MOUTH RINSE: 15.0000 mL | Freq: Once | OROMUCOSAL | Status: AC

## 2019-12-23 MED ORDER — SODIUM CHLORIDE 0.9 % IR SOLN
Status: DC | PRN
  Administered 2019-12-23: 3000 mL

## 2019-12-23 MED ORDER — PROPOFOL 10 MG/ML IV BOLUS
INTRAVENOUS | Status: AC
  Filled 2019-12-23: qty 40

## 2019-12-23 MED ORDER — ACETAMINOPHEN-CODEINE #2 300-15 MG PO TABS: 1.0000 | ORAL_TABLET | Freq: Four times a day (QID) | ORAL | 0 refills | Status: DC | PRN

## 2019-12-23 MED ORDER — SILVER NITRATE-POT NITRATE 75-25 % EX MISC
CUTANEOUS | Status: AC
  Filled 2019-12-23: qty 10

## 2019-12-23 MED ORDER — ENOXAPARIN SODIUM 40 MG/0.4ML ~~LOC~~ SOLN
40.0000 mg | SUBCUTANEOUS | Status: AC
  Administered 2019-12-23: 40 mg via SUBCUTANEOUS

## 2019-12-23 MED ORDER — ACETAMINOPHEN 160 MG/5ML PO SOLN: 1000.0000 mg | Freq: Once | ORAL | Status: DC | PRN

## 2019-12-23 MED ORDER — MIDAZOLAM HCL 2 MG/2ML IJ SOLN
INTRAMUSCULAR | Status: AC
  Filled 2019-12-23: qty 2

## 2019-12-23 MED ORDER — FENTANYL CITRATE (PF) 100 MCG/2ML IJ SOLN: 25.0000 ug | INTRAMUSCULAR | Status: DC | PRN

## 2019-12-23 MED ORDER — MIDAZOLAM HCL 5 MG/5ML IJ SOLN
INTRAMUSCULAR | Status: DC | PRN
  Administered 2019-12-23: 2 mg via INTRAVENOUS

## 2019-12-23 MED ORDER — LIDOCAINE-EPINEPHRINE 1 %-1:100000 IJ SOLN
INTRAMUSCULAR | Status: AC
  Filled 2019-12-23: qty 1

## 2019-12-23 MED ORDER — FENTANYL CITRATE (PF) 100 MCG/2ML IJ SOLN
INTRAMUSCULAR | Status: DC | PRN
  Administered 2019-12-23: 100 ug via INTRAVENOUS

## 2019-12-23 MED ORDER — ONDANSETRON HCL 4 MG/2ML IJ SOLN
INTRAMUSCULAR | Status: AC
  Filled 2019-12-23: qty 2

## 2019-12-23 MED ORDER — PROPOFOL 10 MG/ML IV BOLUS
INTRAVENOUS | Status: DC | PRN
  Administered 2019-12-23: 130 mg via INTRAVENOUS

## 2019-12-23 MED ORDER — DEXAMETHASONE SODIUM PHOSPHATE 10 MG/ML IJ SOLN
INTRAMUSCULAR | Status: AC
  Filled 2019-12-23: qty 1

## 2019-12-23 MED ORDER — SUGAMMADEX SODIUM 200 MG/2ML IV SOLN
INTRAVENOUS | Status: DC | PRN
Start: 2019-12-23 — End: 2019-12-23
  Administered 2019-12-23: 400 mg via INTRAVENOUS

## 2019-12-23 MED ORDER — CHLORHEXIDINE GLUCONATE 0.12 % MT SOLN
15.0000 mL | Freq: Once | OROMUCOSAL | Status: AC
  Administered 2019-12-23: 15 mL via OROMUCOSAL
  Filled 2019-12-23: qty 15

## 2019-12-23 MED ORDER — LIDOCAINE 2% (20 MG/ML) 5 ML SYRINGE
INTRAMUSCULAR | Status: AC
  Filled 2019-12-23: qty 5

## 2019-12-23 MED ORDER — LACTATED RINGERS IV SOLN: INTRAVENOUS | Status: DC

## 2019-12-23 MED ORDER — POVIDONE-IODINE 10 % EX SWAB
2.0000 "application " | Freq: Once | CUTANEOUS | Status: AC
  Administered 2019-12-23: 2 via TOPICAL

## 2019-12-23 MED ORDER — ENOXAPARIN SODIUM 40 MG/0.4ML ~~LOC~~ SOLN
SUBCUTANEOUS | Status: AC
  Filled 2019-12-23: qty 0.4

## 2019-12-23 MED ORDER — OXYCODONE HCL 5 MG/5ML PO SOLN: 5.0000 mg | Freq: Once | ORAL | Status: DC | PRN

## 2019-12-23 MED ORDER — ROCURONIUM BROMIDE 10 MG/ML (PF) SYRINGE
PREFILLED_SYRINGE | INTRAVENOUS | Status: AC
  Filled 2019-12-23: qty 10

## 2019-12-23 MED ORDER — LIDOCAINE 2% (20 MG/ML) 5 ML SYRINGE
INTRAMUSCULAR | Status: DC | PRN
  Administered 2019-12-23: 80 mg via INTRAVENOUS

## 2019-12-23 MED ORDER — ROCURONIUM BROMIDE 10 MG/ML (PF) SYRINGE
PREFILLED_SYRINGE | INTRAVENOUS | Status: DC | PRN
  Administered 2019-12-23: 70 mg via INTRAVENOUS

## 2019-12-23 MED ORDER — ACETAMINOPHEN 10 MG/ML IV SOLN: 1000.0000 mg | Freq: Once | INTRAVENOUS | Status: DC | PRN

## 2019-12-23 MED ORDER — OXYCODONE HCL 5 MG PO TABS: 5.0000 mg | ORAL_TABLET | Freq: Once | ORAL | Status: DC | PRN

## 2019-12-23 MED ORDER — ACETAMINOPHEN 500 MG PO TABS: 1000.0000 mg | ORAL_TABLET | Freq: Once | ORAL | Status: DC | PRN

## 2019-12-23 SURGICAL SUPPLY — 17 items
CANISTER SUCT 3000ML PPV (MISCELLANEOUS) ×3 IMPLANT
CATH ROBINSON RED A/P 16FR (CATHETERS) ×3 IMPLANT
DEVICE MYOSURE LITE (MISCELLANEOUS) ×3 IMPLANT
DEVICE MYOSURE REACH (MISCELLANEOUS) IMPLANT
DILATOR CANAL MILEX (MISCELLANEOUS) ×3 IMPLANT
GLOVE BIOGEL PI IND STRL 7.0 (GLOVE) ×2 IMPLANT
GLOVE BIOGEL PI INDICATOR 7.0 (GLOVE) ×4
GLOVE ECLIPSE 6.5 STRL STRAW (GLOVE) ×6 IMPLANT
GOWN STRL REUS W/ TWL LRG LVL3 (GOWN DISPOSABLE) ×2 IMPLANT
GOWN STRL REUS W/TWL LRG LVL3 (GOWN DISPOSABLE) ×6
KIT PROCEDURE FLUENT (KITS) ×3 IMPLANT
KIT TURNOVER KIT B (KITS) ×3 IMPLANT
PACK VAGINAL MINOR WOMEN LF (CUSTOM PROCEDURE TRAY) ×3 IMPLANT
PAD OB MATERNITY 4.3X12.25 (PERSONAL CARE ITEMS) ×3 IMPLANT
SEAL ROD LENS SCOPE MYOSURE (ABLATOR) ×3 IMPLANT
TOWEL GREEN STERILE FF (TOWEL DISPOSABLE) ×6 IMPLANT
UNDERPAD 30X36 HEAVY ABSORB (UNDERPADS AND DIAPERS) ×3 IMPLANT

## 2019-12-23 NOTE — H&P (Signed)
Cassie [name] is an 59 y.o. female G0 here for hysteroscopy with possible polyp resection, D&C due to persistent PMP bleeding and hx of simple endometrial hyperplasia.  She's been on norethindrone for the hyperplasia and follow up biopsy was negative for abnormal cells but she's continued to bleed and cramp since the biopsy.  Ultrasound was performed showing possible polyp.  Hysteroscopy recommended.  Other than conservative management, no other options are present.  Risks and benefits have been discussed.  Pt is here and ready to proceed.    Pertinent Gynecological History: Menses: post-menopausal Bleeding: post menopausal bleeding Contraception: post menopausal status DES exposure: denies Blood transfusions: none Sexually transmitted diseases: no past history Previous GYN Procedures: endometrial biopsies  Last mammogram: normal Date: 06/14/2018 Last pap: normal Date: 06/20/2019 OB History: G0, P0   Menstrual History: No LMP recorded. Patient is postmenopausal.    Past Medical History:  Diagnosis Date  . Adrenal nodule ([place])   . Allergy   . Anemia    in her early twenties  . Anxiety   . Arthritis    knees,thumbs  . Cardiac arrhythmia    palpitations,PVC'sAC  . Cataract    bilateral removed   . Cyst of kidney, acquired    cyst right kidney- benign  . Degenerative disc disease, lumbar   . Dyspnea   . Edema   . Gallstones   . History of anemia    20 yrs. ago not now- pt denies   . Morbid obesity ([place])   . Osteoarthritis   . Other allergy, other than to medicinal agents   . Panic disorder   . Pulmonary embolism ([place])   . Run of atrial premature complexes   . Ulcerative colitis, unspecified   . Vitamin B12 deficiency   . Vitamin D deficiency     Past Surgical History:  Procedure Laterality Date  . BREAST BIOPSY  11/2011   Left- benign  . CATARACT EXTRACTION Right   . CATARACT EXTRACTION Left   . CHOLECYSTECTOMY N/A 01/17/2014   Procedure: LAPAROSCOPIC  CHOLECYSTECTOMY;  Surgeon: Excell Seltzer, MD;  Location: [facility];  Service: General;  Laterality: N/A;  . COLONOSCOPY    . RETINAL LASER PROCEDURE Left   . SIGMOIDOSCOPY      Family History  Problem Relation Age of Onset  . Heart disease Father 55       pacemaker  . Atrial fibrillation Father   . Hypertension Maternal Aunt   . Hypertension Maternal Grandfather   . Colon cancer Neg Hx   . Stomach cancer Neg Hx   . Colon polyps Neg Hx   . Esophageal cancer Neg Hx   . Rectal cancer Neg Hx     Social History:  reports that she quit smoking about 40 years ago. Her smoking use included cigarettes. She has a 24.00 pack-year smoking history. She has never used smokeless tobacco. She reports that she does not drink alcohol and does not use drugs.  Allergies:  Allergies  Allergen Reactions  . Doxycycline Palpitations  . Hydrocodone Other (See Comments)    Hallucinations   . Sudafed [Pseudoephedrine Hcl] Shortness Of Breath  . Acular [Ketorolac Tromethamine]     Unknown reaction   . Diphenhydramine Hcl Swelling    Throat feels like it swells   . Lactose Intolerance ([place]) Diarrhea and Other (See Comments)    bloating  . Mesalamine Other (See Comments)    Hair loss  . Sulfasalazine Diarrhea  . Azithromycin Palpitations  Speeding heart rate per pt.   . Caffeine Palpitations  . Penicillins Rash    Has patient had a PCN reaction causing immediate rash, facial/tongue/throat swelling, SOB or lightheadedness with hypotension: Yes Has patient had a PCN reaction causing severe rash involving mucus membranes or skin necrosis: No Has patient had a PCN reaction that required hospitalization Yes Has patient had a PCN reaction occurring within the last 10 years: No If all of the above answers are "NO", then may proceed with Cephalosporin use.  . Prednisone Palpitations    Medications Prior to Admission  Medication Sig Dispense Refill Last Dose  . acetaminophen (TYLENOL) 500 MG tablet  Take 1,000 mg by mouth every 6 (six) hours as needed for moderate pain or headache.   Past Month at Unknown time  . ALPRAZolam (XANAX) 1 MG tablet TAKE 1/2 TO 1 TABLET BY MOUTH 2 TIMES DAILY AS NEEDED FOR ANXIETY. (Patient taking differently: Take 0.5-1 mg by mouth See admin instructions. Take 0.5 mg in the morning and 1 mg at bedtime, may take a second 0.5 mg dose during the day as needed for anxiety) 60 tablet 3 12/22/2019 at Unknown time  . atenolol (TENORMIN) 25 MG tablet Take 0.5 tablets (12.5 mg total) by mouth 2 (two) times daily. 90 tablet 2 12/22/2019 at Unknown time  . ELIQUIS 5 MG TABS tablet TAKE 1 TABLET BY MOUTH TWICE DAILY (Patient taking differently: Take 5 mg by mouth 2 (two) times daily. ) 60 tablet 6 12/20/2019  . norethindrone (AYGESTIN) 5 MG tablet TAKE 2 TABLETS(10 MG) BY MOUTH DAILY (Patient taking differently: Take 5 mg by mouth 2 (two) times daily. ) 60 tablet 5 12/22/2019 at Unknown time  . Polyethylene Glycol 400 (VISINE DRY EYE RELIEF OP) Place 1 drop into both eyes daily as needed (dry eye).   Past Month at Unknown time  . Simethicone (GAS-X PO) Take 2 tablets by mouth 4 (four) times daily as needed (For gas.). Do not exceed 6 tablets in a 24 hour period.    Past Week at Unknown time  . sodium chloride (OCEAN) 0.65 % SOLN nasal spray Place 1 spray into both nostrils as needed for congestion.   Past Month at Unknown time  . loperamide (IMODIUM A-D) 2 MG tablet Take 2 mg by mouth 4 (four) times daily as needed for diarrhea or loose stools.   More than a month at Unknown time    Review of Systems  All other systems reviewed and are negative.   Blood pressure (!) 144/53, pulse 70, temperature 98.8 F (37.1 C), resp. rate 20, height 5' 5"  (1.651 m), weight (!) 148.8 kg, SpO2 98 %. Physical Exam Constitutional:      Appearance: Normal appearance.  Pulmonary:     Effort: Pulmonary effort is normal.     Breath sounds: Normal breath sounds.  Abdominal:     General: Abdomen is  flat. Bowel sounds are normal.     Palpations: Abdomen is soft.  Skin:    General: Skin is warm and dry.  Neurological:     General: No focal deficit present.     Mental Status: She is alert.  Psychiatric:        Mood and Affect: Mood normal.     Results for orders placed or performed during the hospital encounter of 12/23/19 (from the past 24 hour(s))  PT- INR Day of Surgery per protocol     Status: None   Collection Time: 12/23/19  6:37 AM  Result Value Ref Range   Prothrombin Time 14.3 11.4 - 15.2 seconds   INR 1.2 0.8 - 1.2   *Note: Due to a large number of results and/or encounters for the requested time period, some results have not been displayed. A complete set of results can be found in Results Review.    No results found.  Assessment/Plan: 59 yo G0 MWF with simple endometrial hyperplasia, morbid obesity, h/o PE on eliquis, possible endometrial polyp here for hysteroscopy with possible polyp resection, D&C.  Pt ready to proceed.  Megan Salon 12/23/2019, 7:10 AM

## 2019-12-23 NOTE — Discharge Instructions (Signed)
Post-surgical Instructions, Outpatient Surgery  You may expect to feel dizzy, weak, and drowsy for as long as 24 hours after receiving the medicine that made you sleep (anesthetic). For the first 24 hours after your surgery:    Do not drive a car, ride a bicycle, participate in physical activities, or take public transportation until you are done taking narcotic pain medicines or as directed by Dr. Sabra Heck.   Do not drink alcohol or take tranquilizers.   Do not take medicine that has not been prescribed by your physicians.   Do not sign important papers or make important decisions while on narcotic pain medicines.   Have a responsible person with you.   PAIN MANAGEMENT  Motrin 876m.  (This is the same as 4-2053mover the counter tablets of Motrin or ibuprofen.)  You may take this every eight hours or as needed for cramping.    Another option for pain is to take 4004mbuprofen/advil/motrin and 500m31mlenol together and take this every 4 hours as needed.    Tylenol #2.  A very small prescription was sent to the pharmacy for this.  You can take 1-2 tablets every 6 hours as needed for pain.  (Remember that narcotic pain medications increase your risk of constipation.  If this becomes a problem, you may take an over the counter stool softener like Colace 100mg90mto four times a day.)  DO'S AND DON'T'S  Do not take a tub bath for one week.  You may shower on the first day after your surgery  Do not do any heavy lifting for one to two weeks.  This increases the chance of bleeding.  Do move around as you feel able.  Stairs are fine.  You may begin to exercise again as you feel able.  Do not lift any weights for two weeks.  Do not put anything in the vagina for two weeks--no tampons, intercourse, or douching.    REGULAR MEDIATIONS/VITAMINS:  You may restart all of your regular medications as prescribed.  You may restart all of your vitamins as you normally take them.    PLEASE CALL OR  SEEK MEDICAL CARE IF:  You have persistent nausea and vomiting.   You have trouble eating or drinking.   You have an oral temperature above 100.5.   You have constipation that is not helped by adjusting diet or increasing fluid intake. Pain medicines are a common cause of constipation.   You have heavy vaginal bleeding

## 2019-12-23 NOTE — Op Note (Signed)
12/23/2019  8:36 AM  PATIENT:  Cassie [name]  59 y.o. female  PRE-OPERATIVE DIAGNOSIS:  Postmenopausal bleeding, Simple endometrial hyperplasia without atypia  POST-OPERATIVE DIAGNOSIS:same  PROCEDURE:  Procedure(s): DILATATION & CURETTAGE, HYSTEROSCOPY WITH MYOSURE, RESECTION OF POLYPOID TISSUE  SURGEON:  Megan Salon  ASSISTANTS: OR staff   ANESTHESIA:   general  ESTIMATED BLOOD LOSS: 20 mL  BLOOD ADMINISTERED:none   FLUIDS: 500ccLR  UOP: 30cc drained with I&O cath at beginning of procedure  SPECIMEN:  Endometrial curettings  DISPOSITION OF SPECIMEN:  PATHOLOGY  FINDINGS: polypoid appearing endometrial tissue, otherwise, cavity appeared normal  DESCRIPTION OF OPERATION: Patient was taken to the operating room.  She is placed in the supine position. SCDs were on her lower extremities and functioning properly. General anesthesia with an LMA was administered without difficulty. Dr. Ermalene Postin, anesthesia, oversaw case.  Legs were then placed in the [place] in the low lithotomy position. The legs were lifted to the high lithotomy position and the Betadine prep was used on the inner thighs perineum and vagina x3. Patient was draped in a normal standard fashion. An in and out catheterization with a red rubber Foley catheter was performed. Approximately 30 cc of clear urine was noted. A bivalve speculum was placed the vagina. The anterior lip of the cervix was grasped with single-tooth tenaculum.  A paracervical block of 1% lidocaine mixed one-to-one with epinephrine (1:100,000 units).  10 cc was used total. The cervix is dilated up to #21 [facility] dilators. The endometrial cavity sounded to 7 cm.   A Myosure hysteroscope was obtained. Normal saline was used as a hysteroscopic fluid. The hysteroscope was advanced through the endocervical canal into the endometrial cavity. The tubal ostia were noted bilaterally. Additional findings included polypoid appearing tissue on the right  lateral/inferior aspect of the endometrial cavity. This was resected with the [facility] L[facility] device.   The hysteroscope was removed. A #1 toothed curette was used to curette the cavity until rough gritty texture was noted in all quadrants. With revisualization of the hysteroscope, there was no longer any abnormal findings.  At this point no other procedure was needed and this procedure was ended. The hysteroscope was removed. The fluid deficit was 350 cc. The tenaculum was removed from the anterior lip of the cervix. The speculum was removed from the vagina. The prep was cleansed of the patient's skin. The legs are positioned back in the supine position. Sponge, lap, needle, initially counts were correct x2. Patient was taken to recovery in stable condition.   COUNTS:  YES  PLAN OF CARE: Transfer to PACU

## 2019-12-23 NOTE — Transfer of Care (Signed)
Immediate Anesthesia Transfer of Care Note  Patient: Cassie [name]  Procedure(s) Performed: DILATATION & CURETTAGE, HYSTEROSCOPY WITH MYOSURE, RESECTION OF POLYPOID TISSUE (N/A Vagina )  Patient Location: PACU  Anesthesia Type:General  Level of Consciousness: awake, alert , oriented and patient cooperative  Airway & Oxygen Therapy: Patient Spontanous Breathing and Patient connected to face mask oxygen  Post-op Assessment: Report given to RN, Post -op Vital signs reviewed and stable and Patient moving all extremities  Post vital signs: Reviewed and stable  Last Vitals:  Vitals Value Taken Time  BP 142/88 12/23/19 0838  Temp    Pulse 64 12/23/19 0840  Resp 13 12/23/19 0840  SpO2 99 % 12/23/19 0840  Vitals shown include unvalidated device data.  Last Pain:  Vitals:   12/23/19 0650  PainSc: 0-No pain      Patients Stated Pain Goal: 3 (75/30/05 1102)  Complications: No complications documented.

## 2019-12-23 NOTE — Anesthesia Procedure Notes (Signed)
Procedure Name: Intubation Date/Time: 12/23/2019 7:47 AM Performed by: Moshe Salisbury, CRNA Pre-anesthesia Checklist: Patient identified, Emergency Drugs available, Suction available and Patient being monitored Patient Re-evaluated:Patient Re-evaluated prior to induction Oxygen Delivery Method: Circle System Utilized Preoxygenation: Pre-oxygenation with 100% oxygen Induction Type: IV induction Ventilation: Mask ventilation without difficulty Laryngoscope Size: Mac and 3 Grade View: Grade I Tube type: Oral Tube size: 7.0 mm Number of attempts: 1 Airway Equipment and Method: Stylet Placement Confirmation: ETT inserted through vocal cords under direct vision,  positive ETCO2 and breath sounds checked- equal and bilateral Secured at: 22 cm Tube secured with: Tape Dental Injury: Teeth and Oropharynx as per pre-operative assessment  Comments: AOI by Pierce Crane., SRNA under direct supervision.

## 2019-12-24 ENCOUNTER — Encounter (HOSPITAL_COMMUNITY): Payer: Self-pay | Admitting: Obstetrics & Gynecology

## 2019-12-24 LAB — SURGICAL PATHOLOGY

## 2019-12-26 NOTE — Anesthesia Postprocedure Evaluation (Signed)
Anesthesia Post Note  Patient: Cassie [name]  Procedure(s) Performed: DILATATION & CURETTAGE, HYSTEROSCOPY WITH MYOSURE, RESECTION OF POLYPOID TISSUE (N/A Vagina )     Anesthesia Post Evaluation No complications documented.  Last Vitals:  Vitals:   12/23/19 0855 12/23/19 0905  BP: 133/61   Pulse: (!) 59   Resp: 10   Temp:  36.5 C  SpO2: 100%     Last Pain:  Vitals:   12/23/19 0855  PainSc: 0-No pain                 Ezriel Boffa

## 2019-12-27 ENCOUNTER — Encounter: Payer: Self-pay | Admitting: Obstetrics & Gynecology

## 2019-12-27 ENCOUNTER — Telehealth: Payer: Self-pay | Admitting: *Deleted

## 2019-12-27 ENCOUNTER — Telehealth: Payer: Self-pay

## 2019-12-27 ENCOUNTER — Encounter: Payer: Self-pay | Admitting: Internal Medicine

## 2019-12-27 ENCOUNTER — Encounter: Payer: Self-pay | Admitting: Family

## 2019-12-27 ENCOUNTER — Other Ambulatory Visit: Payer: Self-pay | Admitting: *Deleted

## 2019-12-27 ENCOUNTER — Other Ambulatory Visit: Payer: Self-pay | Admitting: Family

## 2019-12-27 DIAGNOSIS — I82401 Acute embolism and thrombosis of unspecified deep veins of right lower extremity: Secondary | ICD-10-CM

## 2019-12-27 DIAGNOSIS — I471 Supraventricular tachycardia: Secondary | ICD-10-CM

## 2019-12-27 DIAGNOSIS — Z86711 Personal history of pulmonary embolism: Secondary | ICD-10-CM

## 2019-12-27 DIAGNOSIS — R7989 Other specified abnormal findings of blood chemistry: Secondary | ICD-10-CM

## 2019-12-27 NOTE — Telephone Encounter (Signed)
Please let pt know pathology is all negative.  No abnormal cells were noted.  I would like her to decrease the norethindrone to just once daily and consider repeating her liver enzymes at follow up.  I would like to change the follow up to 1 month to give more time for liver enzymes to change if related to the progesterone.  I did speak to her in the recovery room but she may not remember.  Everything went very well with her surgery.

## 2019-12-27 NOTE — Telephone Encounter (Signed)
See 12/27/19 telephone encounter.   Encounter closed.

## 2019-12-27 NOTE — Telephone Encounter (Signed)
MyChart message to patient.   Encounter closed.

## 2019-12-27 NOTE — Telephone Encounter (Signed)
Routing to Dr. Sabra Heck to review 12/23/19 results and advise.

## 2019-12-27 NOTE — Telephone Encounter (Signed)
Called patient regarding MyChart message about labs to be drawn at Dr. Nathanial Millman office. Per [name] [place],NP, I ordered a CBC with diff, CMET and a D-dimer. Patient verbalized understanding.

## 2019-12-27 NOTE — Telephone Encounter (Signed)
Spoke with patient. Advised as seen below per Dr. Sabra Heck.  Patient reports intermittent light bleeding to spotting this week and intermittent cramping, mostly in LLQ. Denies any pain or bleeding today. Denies fever/chills, N/V, vag odor or abnormal d/c. Reports temp last night was 98.0. Patient reports prior to starting progesterone her temp was in the 97.0 range.   Patient request to keep f/u OV as scheduled for 9/2. Patient states she would prefer to have labs with PCP or hematologist later in the month, is scheduled to see hematology on 9/29.   Advised patient spotting is not uncommon for approximately 2 wks after surgery. Advised patient to continue to monitor and contact the office if she has any new symptoms that develop or bleeding becomes heavy. Advised I will update Dr. Sabra Heck and return call if any additional recommendations. Patient agreeable.    Routing to Dr. Sabra Heck for final review.

## 2019-12-27 NOTE — Telephone Encounter (Signed)
Delton Pra[place], MD 4 minutes ago (1:07 PM)   I have an appointment set for Sept. 24 at my primary care office. Dr. Sharlet Salina is on medical leave and nurses are not sure of labs my different doctors are requesting.  They asked me to contact each one, and ask that you let them know the labs you need. Thank you  MyChart message to patient acknowledging message.  Recommended patient further discuss at [place] on 01/09/20.

## 2019-12-27 NOTE — Telephone Encounter (Signed)
She just needs a CMP or hepatic panel drawn to check the liver enzymes.  Thanks.

## 2019-12-27 NOTE — Telephone Encounter (Signed)
Patient is calling in regards to discuss results.

## 2019-12-30 ENCOUNTER — Telehealth: Payer: Self-pay | Admitting: Obstetrics & Gynecology

## 2019-12-30 ENCOUNTER — Encounter: Payer: Self-pay | Admitting: Obstetrics & Gynecology

## 2019-12-30 NOTE — Telephone Encounter (Signed)
Routing to Dr. Sabra Heck to review and advise.

## 2019-12-30 NOTE — Telephone Encounter (Signed)
Spoke with patient.  Confirmed patient on Aygestin 5 mg PO daily.  Changing non saturated pad q4-5 hours for sanitation.  Patient reports intermittent pain late evening or at night, takes tylenol PRN, waits until pain is at its worst. Discussed taking tylenol at onset of pain and using heating pad.  Denies fever/chills, N/V, vag odor.  Denies pain right now.  Advised as seen below per Dr. Sabra Heck.  Offered earlier OV, patient declined.  Patient will continue to monitor and call if pain becomes severe, bleeding is heavy or any new symptoms develop.  Patient verbalizes understanding and is agreeable.   Routing to provider for final review. Patient is agreeable to disposition. Will close encounter.

## 2019-12-30 NOTE — Telephone Encounter (Signed)
A D&C scrapes the lining of the endometrium and she is on a platelet inhibitor so she is going to have most post procedure bleeding/spotting/cramping than someone who is not on any type of blood thinner/platelet inhibitor.  This is not worrisome and is common when on one of these medications. She has a hx of PE so she should not stop this.  Ok to use tylenol as needed for cramping.  She typically cramps if she bleeds so this is the cramping is not unexpected either.

## 2019-12-30 NOTE — Telephone Encounter (Signed)
Verneice, [place]  P [facility] Hi, I have been bleeding ( not spotting) and having bad cramping since the [facility] last Monday. Some mornings when I first get up, there's a very small amount, more like spotting and usually no cramping overnight. But, everyday so far the bleeding picks up after being up a couple of hours, I would call it moderate, not heavy. The blood went from a reddish pink color the day of and a couple of days after the procedure, to a dark red with some clotting. The cramps usually start up early to late afternoon, when the cramps start is when the bleeding and clotting usually gets worse. Cramps are pretty intense, like before. I usually take Tylenol around 9 at night and by midnight so far they have subsided. I am really having a hard time understanding why this is still happening? Not understanding after the D&C, where is this blood coming from? So, I of course want to keep my Sept 2nd appointment. But would like some input now as to why this is still going on?  Thank you

## 2020-01-01 NOTE — Addendum Note (Signed)
Addended by: Biagio Borg on: 01/01/2020 05:01 PM   Modules accepted: Orders

## 2020-01-06 NOTE — Progress Notes (Deleted)
Post Operative Visit  Procedure:*** Days Post-op: ***  Subjective: ***  Objective: There were no vitals taken for this visit.  EXAM General: {Exam; general:16600} Resp: {Exam; lung:16931} Cardio: {Exam; heart:5510} GI: {Exam, OU:6161224} Extremities: {Exam; extremity:5109} Vaginal Bleeding: {exam; vaginal bleeding:3041122}  Assessment: s/p ***  Plan: Recheck {NUMBER 1-10:22536} weeks ***

## 2020-01-08 ENCOUNTER — Telehealth: Payer: Self-pay | Admitting: Obstetrics & Gynecology

## 2020-01-08 NOTE — Telephone Encounter (Signed)
I actually think moving this out until September would be good because I think she's stopped her norethindrone and I'd like to check her liver enzymes in 4- 6 weeks after she stopped.  Ok to move out two to 4 weeks.  Thanks.

## 2020-01-08 NOTE — Telephone Encounter (Signed)
Spoke with patient. Advised per Dr. Sabra Heck. Patient has not stopped norethindrone, was previously advised to reduce to 1 tab, 10m daily. She is taking norethindrone 558mdaily. She will have her labs with PCP on 9/24. Patient agreeable to r/s OV, r/s to 9/20 at 4:30pm. Patient is aware to call if any concerns.   Routing to provider for final review. Patient is agreeable to disposition. Will close encounter.

## 2020-01-08 NOTE — Telephone Encounter (Signed)
Spoke with patient. S/p D&C 12/23/19. Patient reports no cramping and intermittent scant brown discharge. Some occasional white/yellow vaginal d/c, no odor. She is scheduled for her 2 wks post op OV on 9/1, asking if she should move this out?   Recommended patient keep post-op OV as scheduled unless she needs to reschedule. Patient request to proceed as scheduled. Advised I will update Dr. Sabra Heck and our office will f/u if any additional recommendations.   Routing to provider for final review. Patient is agreeable to disposition. Will close encounter.

## 2020-01-08 NOTE — Telephone Encounter (Signed)
Thanks for the clarification.  As she isn't really bleeding, then just stay at the 36m dosage.  Thanks.

## 2020-01-08 NOTE — Telephone Encounter (Signed)
Patient has a post op appointment tomorrow with Dr.Miller and thinks she may need to "push out the appointment to a later date" She said "I am no longer cramping or bleeding but I do have a some discharge".

## 2020-01-09 ENCOUNTER — Ambulatory Visit: Payer: 59 | Admitting: Obstetrics & Gynecology

## 2020-01-09 NOTE — Telephone Encounter (Signed)
Spoke with patient, reviewed plan of care. Patient verbalizes understanding and thankful for f/u.

## 2020-01-10 ENCOUNTER — Encounter: Payer: Self-pay | Admitting: Family

## 2020-01-17 ENCOUNTER — Encounter: Payer: Self-pay | Admitting: Obstetrics & Gynecology

## 2020-01-17 ENCOUNTER — Telehealth: Payer: Self-pay | Admitting: *Deleted

## 2020-01-17 NOTE — Telephone Encounter (Signed)
Reviewed with Dr. Sabra Heck. Patient is s/p D&C 12/23/19 on Eliquis. Ok to continue to monitor, keep OV for 9/20.   Call returned to patient. Denies heavy bleeding, severe pain, fever/chills, N/V. Patient reports pain in more in LLQ when she experiences cramping. Offered earlier OV, patient declined. Patient is aware to contact office if new symptoms develop, bleeding becomes heavy or severe pain develops. Patient thankful for call.   Routing to provider for final review. Patient is agreeable to disposition. Will close encounter.

## 2020-01-17 NOTE — Telephone Encounter (Signed)
Clance Boll R  P [facility]. From August 1st until [facility] on 8/16, there had been no cramps or bleeding, some discharge was all.  After the D&C, had bleeding and cramping, with some cramps feeling more at times in lower left side (first time this happened). This varied with different amounts of blood/discharge and on and off cramping through Monday. On Tues (8/24) at 1:30 am, going to bathroom before sleeping, I had heavier bleeding (first blood in a week or two), with some clots, no cramps, but soreness. Throughout Tuesday I had different colors and amounts, from beige to light brown discharge with no cramps, this continued , same types of discharge and no cramps until Saturday, 8/24. Had bad cramping for over 12 hours, Tylenol didn't help. Had the same types and amounts of discharge as I had been having. By 10am Sunday morning 8/25, cramps had stopped and barley any discharge. M-W, small amounts of discharge and no cramping. On Thursday 9/9 (yesterday), started having cramps around noon. At 2pm, I noticed fresh, red blood on paper when wiping. Cramping throughout the day, sometimes more on lower left.  By the evening had a dark brown discharge, saw a little fresh blood when wiping. Friday after 12am, before bed, had about a quarter size amt. of dark brown. It had the same color and consistency of chocolate pudding. Cramps had stopped by then. Today at 9am, lighter brown, amount about dime sized with no cramps yet. Appt. set for 9/20, do I need to come in earlier or wait and see? Thanks    Routing MyChart message to Dr. Sabra Heck to review and advise.

## 2020-01-17 NOTE — Telephone Encounter (Signed)
See telephone encounter dated 01/17/20.   Encounter closed.

## 2020-01-24 NOTE — Progress Notes (Signed)
Post Operative Visit  Procedure: D & C, hysteroscopy with myosure, resection of polypoid tissue Days Post-op: 68mh  Subjective: She is post op from hysteroscopy, D&C 12/23/2019.  Still having intermittent cramping and some bleeding.  Pathology was negative.  She is documenting every time she has pain.  Does not want to take anything else other than tylenol.  Images and pathology reviewed.  As pathology resolved, does not need repeat biopsy unless has irregular bleeding in the future (as long as her current irregular bleeding does eventually stop).  She is taking 5513maygestin daily.  Pt felt this increased her liver enzymes.  She likely has fatty liver disease as well.  Repeat enzymes are beign testing at the end of the week.  If this is normal and enzymes decrease, will stay at 13m64mosage.  If still mildly elevated, will go back to the 65m313msage.  Follow up will be planned after results are finalized.  Will also add [facility] [place] estradiol levels to her lab work as she feels she is not fully menopausal.  I feel that is unlikely.   Objective: BP 120/82    Pulse 70    Resp 16   EXAM General: alert and no distress GYN:  NAEFG, vagina without lesions, cervix without lesions, no discharge or VB noted, no uterine tenderness Extremities: venous stasis but bilateral similar changes noted Vaginal Bleeding: none  Assessment: s/p hysteorscopy, D&C with h/o morbid obesity and h/o simple endometrial hyperplasia  Plan: Will await lab work and then plan follow up.  All questions answered.   28 minutes total spent with pt.

## 2020-01-27 ENCOUNTER — Other Ambulatory Visit: Payer: Self-pay

## 2020-01-27 ENCOUNTER — Ambulatory Visit (INDEPENDENT_AMBULATORY_CARE_PROVIDER_SITE_OTHER): Payer: 59 | Admitting: Obstetrics & Gynecology

## 2020-01-27 ENCOUNTER — Encounter: Payer: Self-pay | Admitting: Obstetrics & Gynecology

## 2020-01-27 VITALS — BP 120/82 | HR 70 | Resp 16

## 2020-01-27 DIAGNOSIS — N8501 Benign endometrial hyperplasia: Secondary | ICD-10-CM

## 2020-01-27 DIAGNOSIS — N950 Postmenopausal bleeding: Secondary | ICD-10-CM | POA: Diagnosis not present

## 2020-01-27 DIAGNOSIS — N9489 Other specified conditions associated with female genital organs and menstrual cycle: Secondary | ICD-10-CM | POA: Diagnosis not present

## 2020-01-28 ENCOUNTER — Telehealth: Payer: Self-pay | Admitting: Internal Medicine

## 2020-01-28 DIAGNOSIS — Z13220 Encounter for screening for lipoid disorders: Secondary | ICD-10-CM

## 2020-01-28 NOTE — Telephone Encounter (Signed)
Patient calling in to see any lab orders for upcoming appointment can be sent to lab on [facility] at [facility] office

## 2020-01-28 NOTE — Telephone Encounter (Signed)
I think it is fine to do a virtual visit and for the lipid panel to be drawn through her PCPs office.  Nelva Bush, MD [facility]

## 2020-01-28 NOTE — Telephone Encounter (Signed)
Patient is getting lab work at [facility] office at the end of thiss week. She likes to have the labs drawn there and the LIPID order cannot be seen by that office. I re-rentered the LIPID profile in hopes they will be able to see and draw if. I also entered a note in the appointment notes.  Since COVID is increasing, she would like to know if ok to change visit to a virtual visit. Patient and husband have been staying at home due to [place]. She would like to know if he can refer to EKG from 12/19/19.  She would be fine with either decision from Dr End.  She knows he wanted her to come in person in order to do an EKG since the past several of her appointments have been virtual. She wanted to throw it out there that she had an EKG preoperatively from her recent surgery. Routing to Dr End to review for virtual appointment or in person.

## 2020-01-28 NOTE — Telephone Encounter (Signed)
Message sent to patient saying ok to change to virtual. Appt updated.

## 2020-01-29 ENCOUNTER — Encounter: Payer: Self-pay | Admitting: Obstetrics & Gynecology

## 2020-01-30 ENCOUNTER — Encounter: Payer: Self-pay | Admitting: Family

## 2020-01-31 ENCOUNTER — Other Ambulatory Visit: Payer: 59

## 2020-01-31 ENCOUNTER — Other Ambulatory Visit: Payer: Self-pay

## 2020-01-31 DIAGNOSIS — I82401 Acute embolism and thrombosis of unspecified deep veins of right lower extremity: Secondary | ICD-10-CM

## 2020-01-31 DIAGNOSIS — Z86711 Personal history of pulmonary embolism: Secondary | ICD-10-CM

## 2020-01-31 DIAGNOSIS — I471 Supraventricular tachycardia: Secondary | ICD-10-CM

## 2020-01-31 DIAGNOSIS — N950 Postmenopausal bleeding: Secondary | ICD-10-CM

## 2020-01-31 DIAGNOSIS — I2584 Coronary atherosclerosis due to calcified coronary lesion: Secondary | ICD-10-CM

## 2020-01-31 LAB — LIPID PANEL
Cholesterol: 162 mg/dL (ref <200)
HDL: 29 mg/dL — ABNORMAL LOW (ref >=50)
LDL Cholesterol (Calc): 117 mg/dL (calc) — ABNORMAL HIGH
Non-HDL Cholesterol (Calc): 133 mg/dL (calc) — ABNORMAL HIGH (ref <130)
Total CHOL/HDL Ratio: 5.6 (calc) — ABNORMAL HIGH (ref <5.0)
Triglycerides: 66 mg/dL (ref <150)

## 2020-01-31 LAB — FOLLICLE STIMULATING HORMONE: FSH: 8.8 m[IU]/mL

## 2020-01-31 LAB — ESTRADIOL: Estradiol: 43 pg/mL

## 2020-01-31 NOTE — Addendum Note (Signed)
Addended by: Cresenciano Lick on: 01/31/2020 08:18 AM   Modules accepted: Orders

## 2020-01-31 NOTE — Addendum Note (Signed)
Addended by: Hazle Quant on: 01/31/2020 08:13 AM   Modules accepted: Orders

## 2020-01-31 NOTE — Addendum Note (Signed)
Addended by: Cresenciano Lick on: 01/31/2020 08:19 AM   Modules accepted: Orders

## 2020-02-01 LAB — COMPREHENSIVE METABOLIC PANEL
AG Ratio: 1.2 (calc) (ref 1.0–2.5)
ALT: 17 U/L (ref 6–29)
AST: 14 U/L (ref 10–35)
Albumin: 3.4 g/dL — ABNORMAL LOW (ref 3.6–5.1)
Alkaline phosphatase (APISO): 43 U/L (ref 37–153)
BUN: 8 mg/dL (ref 7–25)
CO2: 27 mmol/L (ref 20–32)
Calcium: 8.6 mg/dL (ref 8.6–10.4)
Chloride: 106 mmol/L (ref 98–110)
Creat: 0.86 mg/dL (ref 0.50–0.99)
Globulin: 2.9 g/dL (calc) (ref 1.9–3.7)
Glucose, Bld: 117 mg/dL — ABNORMAL HIGH (ref 65–99)
Potassium: 4 mmol/L (ref 3.5–5.3)
Sodium: 139 mmol/L (ref 135–146)
Total Bilirubin: 0.4 mg/dL (ref 0.2–1.2)
Total Protein: 6.3 g/dL (ref 6.1–8.1)

## 2020-02-01 LAB — CBC WITH DIFFERENTIAL/PLATELET
Absolute Monocytes: 583 cells/uL (ref 200–950)
Basophils Absolute: 16 cells/uL (ref 0–200)
Basophils Relative: 0.2 %
Eosinophils Absolute: 219 cells/uL (ref 15–500)
Eosinophils Relative: 2.7 %
HCT: 41.9 % (ref 35.0–45.0)
Hemoglobin: 13.7 g/dL (ref 11.7–15.5)
Lymphs Abs: 1555 cells/uL (ref 850–3900)
MCH: 31.3 pg (ref 27.0–33.0)
MCHC: 32.7 g/dL (ref 32.0–36.0)
MCV: 95.7 fL (ref 80.0–100.0)
MPV: 10.9 fL (ref 7.5–12.5)
Monocytes Relative: 7.2 %
Neutro Abs: 5727 cells/uL (ref 1500–7800)
Neutrophils Relative %: 70.7 %
Platelets: 262 10*3/uL (ref 140–400)
RBC: 4.38 10*6/uL (ref 3.80–5.10)
RDW: 12.3 % (ref 11.0–15.0)
Total Lymphocyte: 19.2 %
WBC: 8.1 10*3/uL (ref 3.8–10.8)

## 2020-02-01 LAB — D-DIMER, QUANTITATIVE: D-Dimer, Quant: 0.5 mcg/mL FEU — ABNORMAL HIGH (ref <0.50)

## 2020-02-03 ENCOUNTER — Encounter: Payer: Self-pay | Admitting: Obstetrics & Gynecology

## 2020-02-03 ENCOUNTER — Telehealth: Payer: Self-pay

## 2020-02-03 ENCOUNTER — Inpatient Hospital Stay: Payer: 59 | Attending: Family | Admitting: Family

## 2020-02-03 DIAGNOSIS — I82401 Acute embolism and thrombosis of unspecified deep veins of right lower extremity: Secondary | ICD-10-CM

## 2020-02-03 DIAGNOSIS — Z86711 Personal history of pulmonary embolism: Secondary | ICD-10-CM | POA: Diagnosis not present

## 2020-02-03 DIAGNOSIS — N950 Postmenopausal bleeding: Secondary | ICD-10-CM

## 2020-02-03 NOTE — Telephone Encounter (Signed)
Patient sent the following message via MyChart.  These were completely opposite of what you said they should be.  There are issues perhaps ovary wise that I haven't mentioned, because I had no idea they could be signs of ovarian cancer.  I have of course been researching everything I can find since these levels came in on Saturday.  Along with everything else, I am very concerned.  Please contact me about this.  I am not available Monday the 27th from 3-5, I have a virtual appointment during that time with Dr. Marin Olp.  My D dime was twice as high as my normal?   Thanks Cassie [name]

## 2020-02-03 NOTE — Progress Notes (Addendum)
Hematology and Oncology Follow Up Visit  Cassie [name] [id number] [birth year] 59 y.o. 02/03/2020   Principle Diagnosis:  Idiopathic right lower lobe pulmonary embolism Ulcerative colitis  Current Therapy: ELIQUIS 5 mg by mouth twice a day   Interim History:  Cassie [name] is doing her follow-up via phone today as she is concerned with the rise in Covid numbers.  She had a hysteroscopy and D&C in August and results were negative. She initially had lots of spotting after but that seems to have improved. She takes Aygestin 5 mg PO at supper time daily.  She states that she is taking her Eliquis PO BID as prescribed.  She has SOB with exertion. She states that she has had this for over 4 years but moe recently this seems to have become a little worse. She takes a break to rest as needed.  D-dimer is 0.50.  Occasional palpitations but denies chest pain.  She has an intermittent dry cough. No fever, chills, n/v, rash, dizziness, chest pain, abdominal pain or changes in bowel or bladder habits.  Swelling in her ankles waxes and wanes. No numbness or tingling in her extremities at this time.  No falls or syncopal episodes to report.  Appetite good and hydrating well throughout the day. Her weight is described as stable.   ECOG Performance Status: 1 - Symptomatic but completely ambulatory  Medications:  Allergies as of 02/03/2020      Reactions   Doxycycline Palpitations   Hydrocodone Other (See Comments)   Hallucinations    Sudafed [pseudoephedrine Hcl] Shortness Of Breath   Acular [ketorolac Tromethamine]    Unknown reaction   Diphenhydramine Hcl Swelling   Throat feels like it swells    Lactose Intolerance (gi) Diarrhea, Other (See Comments)   bloating   Mesalamine Other (See Comments)   Hair loss   Sulfasalazine Diarrhea   Azithromycin Palpitations   Speeding heart rate per pt.   Caffeine Palpitations   Penicillins Rash   Has patient had a PCN reaction causing  immediate rash, facial/tongue/throat swelling, SOB or lightheadedness with hypotension: Yes Has patient had a PCN reaction causing severe rash involving mucus membranes or skin necrosis: No Has patient had a PCN reaction that required hospitalization Yes Has patient had a PCN reaction occurring within the last 10 years: No If all of the above answers are "NO", then may proceed with Cephalosporin use.   Prednisone Palpitations      Medication List       Accurate as of February 03, 2020  4:03 PM. If you have any questions, ask your nurse or doctor.        acetaminophen 500 MG tablet Commonly known as: TYLENOL Take 1,000 mg by mouth every 6 (six) hours as needed for moderate pain or headache.   ALPRAZolam 1 MG tablet Commonly known as: XANAX TAKE 1/2 TO 1 TABLET BY MOUTH 2 TIMES DAILY AS NEEDED FOR ANXIETY. What changed: See the new instructions.   atenolol 25 MG tablet Commonly known as: TENORMIN Take 0.5 tablets (12.5 mg total) by mouth 2 (two) times daily.   Eliquis 5 MG Tabs tablet Generic drug: apixaban TAKE 1 TABLET BY MOUTH TWICE DAILY What changed: how much to take   GAS-X PO Take 2 tablets by mouth 4 (four) times daily as needed (For gas.). Do not exceed 6 tablets in a 24 hour period.   Imodium A-D 2 MG tablet Generic drug: loperamide Take 2 mg by mouth 4 (four) times daily as  needed for diarrhea or loose stools.   norethindrone 5 MG tablet Commonly known as: AYGESTIN TAKE 2 TABLETS(10 MG) BY MOUTH DAILY What changed: See the new instructions.   sodium chloride 0.65 % Soln nasal spray Commonly known as: OCEAN Place 1 spray into both nostrils as needed for congestion.   VISINE DRY EYE RELIEF OP Place 1 drop into both eyes daily as needed (dry eye).       Allergies:  Allergies  Allergen Reactions  . Doxycycline Palpitations  . Hydrocodone Other (See Comments)    Hallucinations   . Sudafed [Pseudoephedrine Hcl] Shortness Of Breath  . Acular [Ketorolac  Tromethamine]     Unknown reaction   . Diphenhydramine Hcl Swelling    Throat feels like it swells   . Lactose Intolerance (Gi) Diarrhea and Other (See Comments)    bloating  . Mesalamine Other (See Comments)    Hair loss  . Sulfasalazine Diarrhea  . Azithromycin Palpitations    Speeding heart rate per pt.   . Caffeine Palpitations  . Penicillins Rash    Has patient had a PCN reaction causing immediate rash, facial/tongue/throat swelling, SOB or lightheadedness with hypotension: Yes Has patient had a PCN reaction causing severe rash involving mucus membranes or skin necrosis: No Has patient had a PCN reaction that required hospitalization Yes Has patient had a PCN reaction occurring within the last 10 years: No If all of the above answers are "NO", then may proceed with Cephalosporin use.  . Prednisone Palpitations    Past Medical History, Surgical history, Social history, and Family History were reviewed and updated.  Review of Systems: All other 10 point review of systems is negative.   Physical Exam:  vitals were not taken for this visit.   Wt Readings from Last 3 Encounters:  12/23/19 (!) 328 lb 0.7 oz (148.8 kg)  12/19/19 (!) 328 lb (148.8 kg)  08/22/19 (!) 326 lb (147.9 kg)    Lab Results  Component Value Date   WBC 8.1 01/31/2020   HGB 13.7 01/31/2020   HCT 41.9 01/31/2020   MCV 95.7 01/31/2020   PLT 262 01/31/2020   Lab Results  Component Value Date   IRON 72 07/17/2013   IRONPCTSAT 24.5 07/17/2013   Lab Results  Component Value Date   RBC 4.38 01/31/2020   No results found for: Nils Pyle [facility] Lab Results  Component Value Date   IGA 341 05/25/2015   No results found for: Odetta Pink, SPEI   Chemistry      Component Value Date/Time   NA 139 01/31/2020 0835   NA 139 01/24/2017 0929   K 4.0 01/31/2020 0835   K 4.0 01/24/2017 0929   CL 106 01/31/2020 0835   CL 103  10/10/2016 1403   CL 105 09/09/2016 1332   CO2 27 01/31/2020 0835   CO2 24 01/24/2017 0929   BUN 8 01/31/2020 0835   BUN 9.5 01/24/2017 0929   CREATININE 0.86 01/31/2020 0835   CREATININE 0.8 01/24/2017 0929      Component Value Date/Time   CALCIUM 8.6 01/31/2020 0835   CALCIUM 9.7 01/24/2017 0929   ALKPHOS 46 10/25/2019 0846   ALKPHOS 107 01/24/2017 0929   AST 14 01/31/2020 0835   AST 12 (L) 01/07/2019 0749   AST 18 01/24/2017 0929   ALT 17 01/31/2020 0835   ALT 13 01/07/2019 0749   ALT 16 01/24/2017 0929   BILITOT 0.4 01/31/2020 0835   BILITOT  0.4 01/07/2019 0749   BILITOT 0.31 01/24/2017 0929       Impression and Plan: Cassie [name] is a pleasant 59yo caucasian female with history of PE.  She continues to do well on Eliquis 5 mg PO BID and will stay on her same regimen.  CT angio order is in to assess for cause of increased SOB.  Follow-up in 6 months and lab only in 3 months.   She will contact our office with any questions or concerns.   Laverna Peace, NP 9/27/20214:03 PM

## 2020-02-03 NOTE — Telephone Encounter (Signed)
Pt had FSH and Estradiol  labs drawn by Dr Marin Olp  Routing to Dr Sabra Heck review and recommendations

## 2020-02-04 ENCOUNTER — Encounter: Payer: Self-pay | Admitting: Family

## 2020-02-04 ENCOUNTER — Ambulatory Visit: Payer: 59 | Admitting: Family

## 2020-02-04 ENCOUNTER — Telehealth: Payer: Self-pay | Admitting: Family

## 2020-02-04 NOTE — Telephone Encounter (Signed)
Spoke with pt. Pt given results and recommendations per Dr Sabra Heck. Pt agreeable and verbalized understanding.   Pt states will have Dr Sharlet Salina to redo her [facility], estradiol and CA-125 at [place] at end of Nov. Pt declines referral at this time. Will wait til has results after OV with Dr Sharlet Salina or pt is asking should see gyn/onc now or wait?   Pt states is taking 77m daily x 3-4 weeks since decreased per Dr MSabra Heck Pt states  since liver enzymes are now normal as of 9/24, pt asking if needs to keep at 58mor increase back to 10 mg?  Advised will review with Dr MiSabra Heckpt agreeable.    Routing to Dr MiSabra Heck

## 2020-02-04 NOTE — Telephone Encounter (Signed)
Please let pt know her [place] is low and estradiol is 43.  Her ovaries are making some estrogen so she is not fully menopausal.  This is unusual at 68 but not completely impossible.  This explains her continued spotting and probable cramping.  These are not tests for ovarian cancer.  Ultrasound/MRI/CT scanning are the best tests for ovarian cancer.  She had an ultrasound with [place] on 11/27/2019 and her ovaries were both small and atrophic in appearance.  Our ultrasonographer got very good pictures of them.  She does not have any evidence of ovarian cancer.  If she desires a consultation with gyn/oncology for a second opinion, I am happy to refer her for this.

## 2020-02-04 NOTE — Telephone Encounter (Signed)
Appointments scheduled calendar printed & mailed per 9/27 los

## 2020-02-05 ENCOUNTER — Ambulatory Visit: Payer: 59 | Admitting: Family

## 2020-02-05 ENCOUNTER — Encounter: Payer: Self-pay | Admitting: Family

## 2020-02-14 NOTE — Telephone Encounter (Signed)
Please let pt know it is ok to stay on the 89m aygestin.  I don't think she needs to see gyn/onc because she does not have cancer.  I would see if she would do the blood work sooner due to my transition so I can help answer her questions before the very end of November.

## 2020-02-18 NOTE — Telephone Encounter (Signed)
Spoke with pt. Pt given update per Dr Sabra Heck. Pt declines gyn/onc referral at this time. States would be happy to come back for labs. Pt scheduled for 03/06/20 at 845 am. Pt agreeable to date and time for lab appt.   Does pt need OV for follow up after lab resulted?   Routing to Dr Sabra Heck.  Future lab order placed

## 2020-02-19 NOTE — Telephone Encounter (Signed)
Just lab work for now.  Thanks.

## 2020-02-26 ENCOUNTER — Encounter: Payer: Self-pay | Admitting: Internal Medicine

## 2020-02-26 ENCOUNTER — Telehealth (INDEPENDENT_AMBULATORY_CARE_PROVIDER_SITE_OTHER): Payer: 59 | Admitting: Internal Medicine

## 2020-02-26 VITALS — BP 124/66 | HR 60 | Ht 65.0 in | Wt 328.0 lb

## 2020-02-26 DIAGNOSIS — I471 Supraventricular tachycardia: Secondary | ICD-10-CM

## 2020-02-26 DIAGNOSIS — M7989 Other specified soft tissue disorders: Secondary | ICD-10-CM

## 2020-02-26 DIAGNOSIS — E785 Hyperlipidemia, unspecified: Secondary | ICD-10-CM

## 2020-02-26 DIAGNOSIS — R0602 Shortness of breath: Secondary | ICD-10-CM | POA: Diagnosis not present

## 2020-02-26 NOTE — Progress Notes (Signed)
Virtual Visit via Telephone Note   This visit type was conducted due to national recommendations for restrictions regarding the COVID-19 Pandemic (e.g. social distancing) in an effort to limit this patient's exposure and mitigate transmission in our community.  Due to her co-morbid illnesses, this patient is at least at moderate risk for complications without adequate follow up.  This format is felt to be most appropriate for this patient at this time.  The patient did not have access to video technology/had technical difficulties with video requiring transitioning to audio format only (telephone).  All issues noted in this document were discussed and addressed.  No physical exam could be performed with this format.  Please refer to the patient's chart for her  consent to telehealth for [facility].    Date:  02/27/2020   ID:  Cassie [name], DOB 1960/11/09, MRN [id number] The patient was identified using 2 identifiers.  Patient Location: Home Provider Location: Office/Clinic  PCP:  Hoyt Koch, MD  Cardiologist:  Nelva Bush, MD  Electrophysiologist:  None   Evaluation Performed:  Follow-Up Visit  Chief Complaint: Follow-up palpitations  History of Present Illness:    Cassie [name] is a 59 y.o. female with history of palpitations with brief PSVT and PVCs, chest pain with myocardial bridging in the mid LAD, pulmonary embolism on chronic anticoagulation with apixaban, ulcerative colitis, vitamin B12 deficiency, vitamin D deficiency, anxiety, and morbid obesity.  We last spoke via virtual visit in April.  Since that time, Cassie [name] had issues with vaginal bleeding and is now on hormone therapy.  From a heart standpoint, she feels about the same or slightly better.  Occasional skipped heartbeats and brief flutters may be minimally less frequent.  She continues to take atenolol 12.5 mg daily, though she has found that it is easier to quarter a 50 mg tablet rather than  split a 25 mg tablet in half.  She has stable dependent edema in her feet.  She has not had any frank chest pain but occasionally has a vague uncomfortable feeling that is rare and nonexertional.  Chronic exertional dyspnea with mild activity is stable to slightly better.  [name] notes that she has remained quite sedentary and is not walking or exercising on a regular basis.   Past Medical History:  Diagnosis Date  . Adrenal nodule ([place])   . Allergy   . Anemia    in her early twenties  . Anxiety   . Arthritis    knees,thumbs  . Cardiac arrhythmia    palpitations,PVC'sAC  . Cataract    bilateral removed   . Cyst of kidney, acquired    cyst right kidney- benign  . Degenerative disc disease, lumbar   . Dyspnea   . Edema   . Gallstones   . History of anemia    20 yrs. ago not now- pt denies   . Morbid obesity ([place])   . Osteoarthritis   . Other allergy, other than to medicinal agents   . Panic disorder   . Pulmonary embolism ([place])   . Run of atrial premature complexes   . Ulcerative colitis, unspecified   . Vitamin B12 deficiency   . Vitamin D deficiency    Past Surgical History:  Procedure Laterality Date  . BREAST BIOPSY  11/2011   Left- benign  . CATARACT EXTRACTION Right   . CATARACT EXTRACTION Left   . CHOLECYSTECTOMY N/A 01/17/2014   Procedure: LAPAROSCOPIC CHOLECYSTECTOMY;  Surgeon: Excell Seltzer, MD;  Location: [facility];  Service: General;  Laterality: N/A;  . COLONOSCOPY    . DILATATION & CURETTAGE/HYSTEROSCOPY WITH MYOSURE N/A 12/23/2019   Procedure: DILATATION & CURETTAGE, HYSTEROSCOPY WITH MYOSURE, RESECTION OF POLYPOID TISSUE;  Surgeon: Megan Salon, MD;  Location: [place];  Service: Gynecology;  Laterality: N/A;  . RETINAL LASER PROCEDURE Left   . SIGMOIDOSCOPY       Current Meds  Medication Sig  . acetaminophen (TYLENOL) 500 MG tablet Take 1,000 mg by mouth every 6 (six) hours as needed for moderate pain or headache.  . ALPRAZolam (XANAX) 1 MG tablet  TAKE 1/2 TO 1 TABLET BY MOUTH 2 TIMES DAILY AS NEEDED FOR ANXIETY. (Patient taking differently: Take 0.5-1 mg by mouth See admin instructions. Take 0.5 mg in the morning and 1 mg at bedtime, may take a second 0.5 mg dose during the day as needed for anxiety)  . atenolol (TENORMIN) 25 MG tablet Take 0.5 tablets (12.5 mg total) by mouth 2 (two) times daily.  Marland Kitchen ELIQUIS 5 MG TABS tablet TAKE 1 TABLET BY MOUTH TWICE DAILY (Patient taking differently: Take 5 mg by mouth 2 (two) times daily. )  . loperamide (IMODIUM A-D) 2 MG tablet Take 2 mg by mouth 4 (four) times daily as needed for diarrhea or loose stools.  . norethindrone (AYGESTIN) 5 MG tablet TAKE 2 TABLETS(10 MG) BY MOUTH DAILY (Patient taking differently: Take 5 mg by mouth daily. )  . Polyethylene Glycol 400 (VISINE DRY EYE RELIEF OP) Place 1 drop into both eyes daily as needed (dry eye).  . Simethicone (GAS-X PO) Take 2 tablets by mouth 4 (four) times daily as needed (For gas.). Do not exceed 6 tablets in a 24 hour period.      Allergies:   Doxycycline, Hydrocodone, Sudafed [pseudoephedrine hcl], Acular [ketorolac tromethamine], Diphenhydramine hcl, Lactose intolerance (gi), Mesalamine, Sulfasalazine, Azithromycin, Caffeine, Penicillins, and Prednisone   Social History   Tobacco Use  . Smoking status: Former Smoker    Packs/day: 2.00    Years: 12.00    Pack years: 24.00    Types: Cigarettes    Quit date: 03/07/1979    Years since quitting: 41.0  . Smokeless tobacco: Never Used  Vaping Use  . Vaping Use: Never used  Substance Use Topics  . Alcohol use: No    Alcohol/week: 0.0 standard drinks  . Drug use: No     Family Hx: The patient's family history includes Atrial fibrillation in her father; Heart disease (age of onset: 41) in her father; Hypertension in her maternal aunt and maternal grandfather. There is no history of Colon cancer, Stomach cancer, Colon polyps, Esophageal cancer, or Rectal cancer.  ROS:   Please see the  history of present illness.   All other systems reviewed and are negative.   Prior CV studies:   The following studies were reviewed today:  TTE (08/16/2019): Normal LV size and wall thickness.  LVEF 60-65% with grade 1 diastolic dysfunction.  Normal RV size and function.  Mild left ventricular enlargement.  No significant valvular abnormality.  14-day event monitor (06/02/19):Predominantly sinus rhythm with rare PACs and PVCs as well as brief PSVT and a single 5 beat run of NSVT followed by transient accelerated idioventricular rhythm.  Labs/Other Tests and Data Reviewed:    EKG:  An ECG dated 12/19/2019 was personally reviewed today and demonstrated:  Sinus bradycardia (heart rate 59 bpm) with low voltage in the precordial leads.  Otherwise, no significant abnormality.  Recent Labs: 07/30/2019:  Magnesium 2.1; TSH 3.91 01/31/2020: ALT 17; BUN 8; Creat 0.86; Hemoglobin 13.7; Platelets 262; Potassium 4.0; Sodium 139   Recent Lipid Panel Lab Results  Component Value Date/Time   CHOL 162 01/31/2020 08:20 AM   TRIG 66 01/31/2020 08:20 AM   HDL 29 (L) 01/31/2020 08:20 AM   CHOLHDL 5.6 (H) 01/31/2020 08:20 AM   LDLCALC 117 (H) 01/31/2020 08:20 AM    Wt Readings from Last 3 Encounters:  02/26/20 (!) 328 lb (148.8 kg)  12/23/19 (!) 328 lb 0.7 oz (148.8 kg)  12/19/19 (!) 328 lb (148.8 kg)     Risk Assessment/Calculations:      Objective:    Vital Signs:  BP 124/66   Pulse 60   Ht 5' 5"  (1.651 m)   Wt (!) 328 lb (148.8 kg)   SpO2 98%   BMI 54.58 kg/m    VITAL SIGNS:  reviewed  ASSESSMENT & PLAN:    PSVT: Overall symptoms are stable to minimally better from our last visit.  As Ms. Lenk finds it easy is to quarter 50 mg atenolol tablets, I raised the possibility of transitioning to an alternative beta-blocker that may be easier to take.  We also discussed once daily atenolol dosing, but Ms. Mcgrory is concerned that she may have breakthrough symptoms.  She would like to  continue with her current medication regimen, as she has numerous atenolol pills on hand at home.  We will therefore defer medication changes at this time.  Shortness of breath and leg swelling: Ms. Witherell reports stable dependent edema and exertional dyspnea that is likely multifactorial.  I suspect deconditioning and morbid obesity are the driving factors.  I encouraged her to try to increase her activity as tolerated and to work on weight loss.  Hyperlipidemia: Lipid panel last month notable for LDL of 117 and HDL of 29.  I do not recommend pharmacotherapy at this time.  Ms. Nephew would benefit from weight loss and exercise to help improve her lipids.  Morbid obesity: BMI remains greater than 50, with weight continue to trend up in the setting of inactivity.  Weight loss encouraged through diet and exercise.  Time:   Today, I have spent 15 minutes with the patient with telehealth technology discussing the above problems.     Medication Adjustments/Labs and Tests Ordered: Current medicines are reviewed at length with the patient today.  Concerns regarding medicines are outlined above.   Tests Ordered: None.  Medication Changes: None  Follow Up:  In Person in 6 month(s)  Signed, Nelva Bush, MD  02/27/2020 8:17 AM    [place]

## 2020-02-26 NOTE — Patient Instructions (Signed)
Medication Instructions:  Your physician recommends that you continue on your current medications as directed. Please refer to the Current Medication list given to you today.  *If you need a refill on your cardiac medications before your next appointment, please call your pharmacy*  Follow-Up: At [facility], you and your health needs are our priority.  As part of our continuing mission to provide you with exceptional heart care, we have created designated Provider Care Teams.  These Care Teams include your primary Cardiologist (physician) and Advanced Practice Providers (APPs -  Physician Assistants and Nurse Practitioners) who all work together to provide you with the care you need, when you need it.  We recommend signing up for the patient portal called "MyChart".  Sign up information is provided on this After Visit Summary.  MyChart is used to connect with patients for Virtual Visits (Telemedicine).  Patients are able to view lab/test results, encounter notes, upcoming appointments, etc.  Non-urgent messages can be sent to your provider as well.   To learn more about what you can do with MyChart, go to [email or web address].    Your next appointment:   6 month(s)  In person preferably.  The format for your next appointment:   In Person  Provider:   You may see Nelva Bush, MD or one of the following Advanced Practice Providers on your designated Care Team:    Murray Hodgkins, NP  Christell Faith, PA-C  Marrianne Mood, PA-C  Cadence [place], [place]

## 2020-02-27 ENCOUNTER — Encounter: Payer: Self-pay | Admitting: Internal Medicine

## 2020-02-27 DIAGNOSIS — M7989 Other specified soft tissue disorders: Secondary | ICD-10-CM | POA: Insufficient documentation

## 2020-02-27 DIAGNOSIS — E785 Hyperlipidemia, unspecified: Secondary | ICD-10-CM | POA: Insufficient documentation

## 2020-02-28 ENCOUNTER — Other Ambulatory Visit: Payer: Self-pay | Admitting: Hematology & Oncology

## 2020-02-29 ENCOUNTER — Other Ambulatory Visit: Payer: Self-pay | Admitting: Internal Medicine

## 2020-03-02 NOTE — Telephone Encounter (Signed)
Done erx 

## 2020-03-03 ENCOUNTER — Telehealth: Payer: Self-pay

## 2020-03-03 DIAGNOSIS — K51919 Ulcerative colitis, unspecified with unspecified complications: Secondary | ICD-10-CM

## 2020-03-03 NOTE — Telephone Encounter (Signed)
Patient is calling wanting to add "a white blood count" to her blood work on (03/06/20).

## 2020-03-03 NOTE — Telephone Encounter (Signed)
Routing to Dr Sabra Heck, please advise  Pt is scheduled for lab visit on 10/29 at 845 am. Labs to be drawn, CA-125 and Estradiol and FSH.

## 2020-03-04 ENCOUNTER — Other Ambulatory Visit: Payer: Self-pay | Admitting: Obstetrics & Gynecology

## 2020-03-04 DIAGNOSIS — R1032 Left lower quadrant pain: Secondary | ICD-10-CM

## 2020-03-04 DIAGNOSIS — Z8719 Personal history of other diseases of the digestive system: Secondary | ICD-10-CM

## 2020-03-04 NOTE — Telephone Encounter (Signed)
Spoke with pt. Pt states wanting to check on WBC due to having Colitis flare up for last 1.5 weeks. Pt states would like to see if needs to call GI Doctor for treatment if white count is elevated.  Pt states having loose stool with abd cramps x 1.5 weeks.  Pt also states wanting to give update to Dr Sabra Heck and states having no vaginal bleeding or abd cramps in several weeks and feeling good.   Routing to Dr Sabra Heck, please advise, CBC pended if approved.

## 2020-03-04 NOTE — Telephone Encounter (Signed)
CBC with diff added to order.  I don't think she needs to wait for this to come back to call GI.   Please encourage her to call GI today.  Thanks.

## 2020-03-04 NOTE — Telephone Encounter (Signed)
She did a CBC with differential on 01/31/2020 that was completely normal.  So, I need a reason to order this.  Am happy to but just need a reason.  Could you ask pt?  Thanks.

## 2020-03-05 NOTE — Telephone Encounter (Signed)
Spoke with pt. Pt given update and recommendations per Dr Sabra Heck. Pt agreeable and verbalized understanding.  Encounter closed.

## 2020-03-06 ENCOUNTER — Other Ambulatory Visit: Payer: 59

## 2020-03-06 ENCOUNTER — Other Ambulatory Visit: Payer: Self-pay

## 2020-03-06 DIAGNOSIS — R1032 Left lower quadrant pain: Secondary | ICD-10-CM

## 2020-03-06 DIAGNOSIS — Z8719 Personal history of other diseases of the digestive system: Secondary | ICD-10-CM

## 2020-03-06 DIAGNOSIS — N950 Postmenopausal bleeding: Secondary | ICD-10-CM

## 2020-03-07 LAB — CBC WITH DIFFERENTIAL/PLATELET
Basophils Absolute: 0 10*3/uL (ref 0.0–0.2)
Basos: 0 %
EOS (ABSOLUTE): 0.2 10*3/uL (ref 0.0–0.4)
Eos: 2 %
Hematocrit: 45.1 % (ref 34.0–46.6)
Hemoglobin: 14.4 g/dL (ref 11.1–15.9)
Immature Grans (Abs): 0 10*3/uL (ref 0.0–0.1)
Immature Granulocytes: 0 %
Lymphocytes Absolute: 1.8 10*3/uL (ref 0.7–3.1)
Lymphs: 23 %
MCH: 30.8 pg (ref 26.6–33.0)
MCHC: 31.9 g/dL (ref 31.5–35.7)
MCV: 96 fL (ref 79–97)
Monocytes Absolute: 0.4 10*3/uL (ref 0.1–0.9)
Monocytes: 5 %
Neutrophils Absolute: 5.6 10*3/uL (ref 1.4–7.0)
Neutrophils: 70 %
Platelets: 268 10*3/uL (ref 150–450)
RBC: 4.68 x10E6/uL (ref 3.77–5.28)
RDW: 12.5 % (ref 11.7–15.4)
WBC: 8 10*3/uL (ref 3.4–10.8)

## 2020-03-07 LAB — ESTRADIOL: Estradiol: 47.6 pg/mL

## 2020-03-07 LAB — CA 125: Cancer Antigen (CA) 125: 9.8 U/mL (ref 0.0–38.1)

## 2020-03-07 LAB — FOLLICLE STIMULATING HORMONE: FSH: 8.3 m[IU]/mL

## 2020-03-12 ENCOUNTER — Encounter: Payer: Self-pay | Admitting: Obstetrics & Gynecology

## 2020-03-24 ENCOUNTER — Ambulatory Visit: Payer: 59 | Admitting: Internal Medicine

## 2020-03-24 ENCOUNTER — Other Ambulatory Visit: Payer: Self-pay

## 2020-03-24 ENCOUNTER — Encounter: Payer: Self-pay | Admitting: Internal Medicine

## 2020-03-24 VITALS — BP 118/80 | HR 60 | Temp 98.4°F

## 2020-03-24 DIAGNOSIS — R1031 Right lower quadrant pain: Secondary | ICD-10-CM | POA: Diagnosis not present

## 2020-03-24 MED ORDER — METRONIDAZOLE 500 MG PO TABS
500.0000 mg | ORAL_TABLET | Freq: Three times a day (TID) | ORAL | 0 refills | Status: DC
Start: 2020-03-24 — End: 2020-04-10

## 2020-03-24 NOTE — Patient Instructions (Signed)
We have sent in the metronidazole to take 3 times a day for the bowels if they are not improving.

## 2020-03-24 NOTE — Progress Notes (Signed)
   Subjective:   Patient ID: Cassie [name], female    DOB: Aug 09, 1960, 59 y.o.   MRN: [id number]  HPI The patient is a 59 YO female coming in for concerns about abdominal pain. She did have imaging done in the ER for this without evidence of acute changes. She has had colitis in the past and this feels similar in ways although not totally. She does have multiple drug intolerances and is concerned about taking medications for this. Denies nausea or vomiting. Denies blood in stool.   Review of Systems  Constitutional: Negative.   HENT: Negative.   Eyes: Negative.   Respiratory: Negative for cough, chest tightness and shortness of breath.   Cardiovascular: Negative for chest pain, palpitations and leg swelling.  Gastrointestinal: Positive for abdominal pain. Negative for abdominal distention, constipation, diarrhea, nausea and vomiting.  Musculoskeletal: Negative.   Skin: Negative.   Neurological: Negative.   Psychiatric/Behavioral: Negative.     Objective:  Physical Exam Constitutional:      Appearance: She is well-developed.  HENT:     Head: Normocephalic and atraumatic.  Cardiovascular:     Rate and Rhythm: Normal rate and regular rhythm.  Pulmonary:     Effort: Pulmonary effort is normal. No respiratory distress.     Breath sounds: Normal breath sounds. No wheezing or rales.  Abdominal:     General: Bowel sounds are normal. There is no distension.     Palpations: Abdomen is soft.     Tenderness: There is abdominal tenderness. There is no rebound.  Musculoskeletal:     Cervical back: Normal range of motion.  Skin:    General: Skin is warm and dry.  Neurological:     Mental Status: She is alert and oriented to person, place, and time.     Coordination: Coordination normal.     Vitals:   03/24/20 1532  BP: 118/80  Pulse: 60  Temp: 98.4 F (36.9 C)  TempSrc: Oral  SpO2: 98%   This visit occurred during the SARS-CoV-2 public health emergency.  Safety protocols were  in place, including screening questions prior to the visit, additional usage of staff PPE, and extensive cleaning of exam room while observing appropriate contact time as indicated for disinfecting solutions.   Assessment & Plan:

## 2020-03-27 NOTE — Assessment & Plan Note (Signed)
Rx flagyl to treat for colitis. Prior imaging without acute concerns. Labs also checked previously which do not need repeat.

## 2020-04-06 ENCOUNTER — Telehealth: Payer: Self-pay

## 2020-04-06 NOTE — Telephone Encounter (Signed)
Patient calling about cramping and on going uterus  problems. She is having abnormal discharge starting this week. Patient requesting appointment.

## 2020-04-06 NOTE — Telephone Encounter (Signed)
hysteroscopy, D&C 12/23/2019 with Dr Sabra Heck  H/o PMB FH uterine cancer  h/o simple endometrial hyperplasia  Spoke with pt. Pt states having same vaginal discharge that ranges from light yellow to beige to dark yellow with cramping since 03/31/20. Rates cramps from 7-10 on pain scale. Has not been seen in ER or UC.  Pt denies vaginal odor, itching or vaginal bleeding. Pt states had UC flare up x 2 weeks ago. Pt states taking 1g of Tylenol OTC daily for cramps which help resolve. Denies nausea, vomiting, diarrhea, fever, chills.   Pt advised to be seen by another provider in our office for further evaluation. Pt agreeable. Pt scheduled with Dr Quincy Simmonds on 11/30 at 4 pm. Pt verbalized understanding to date and time of appt.  Routing to Dr Quincy Simmonds for review Encounter closed

## 2020-04-07 ENCOUNTER — Other Ambulatory Visit: Payer: Self-pay

## 2020-04-07 ENCOUNTER — Ambulatory Visit: Payer: 59 | Admitting: Obstetrics and Gynecology

## 2020-04-07 ENCOUNTER — Encounter: Payer: Self-pay | Admitting: Obstetrics and Gynecology

## 2020-04-07 ENCOUNTER — Encounter: Payer: 59 | Admitting: Internal Medicine

## 2020-04-07 VITALS — BP 136/72 | HR 61

## 2020-04-07 DIAGNOSIS — N898 Other specified noninflammatory disorders of vagina: Secondary | ICD-10-CM

## 2020-04-07 DIAGNOSIS — R102 Pelvic and perineal pain: Secondary | ICD-10-CM

## 2020-04-07 DIAGNOSIS — Z8742 Personal history of other diseases of the female genital tract: Secondary | ICD-10-CM

## 2020-04-07 DIAGNOSIS — N939 Abnormal uterine and vaginal bleeding, unspecified: Secondary | ICD-10-CM

## 2020-04-07 NOTE — Progress Notes (Signed)
GYNECOLOGY  VISIT   HPI: 59 y.o.   Married  Caucasian  female   [id number] with No LMP recorded. Patient is postmenopausal.   here for severe lower pelvic cramping which began again 7-10 days ago. It has gotten better but has not subsided totally. Her vaginal discharge during these episode goes from clear/white to more beige/brown.  Husband present for the visit today.   States her uterine cramps are a stabbing pain, that come and go and last 12 hours.  Did not have cramping for a month.  This has been ongoing since this summer.   States she has a regular white/yellow discharge always.  The discharge can change color when she had the pain.   She did notice some blood last night with wiping.    Takes Tylenol for pain.  Unable to take NSAIDs due to use of Eliquis.   Patient does have hx of postmenopausal bleeding, which started last fall.  She was a patient at [facility], and she had a dx of simple endometrial hyperplasia.  She transferred her care to Dr. Edwinna Areola in June. She had an EMB which showed benign endometrium in June 2021.  She had a dilation and curettage in August 2021, and she had benign pathology also.  She is currently being treated with Aygestin 5 mg daily.  When she took Aygestin 10 mg daily, she had elevated liver function.   FSH 8.3 and Estradiol 47.6 on 03/06/20.  Pelvic US 11/28/19 - endometrial masses, normal ovaries.  CA125 9.8.   She has a hx of a pulmonary embolus, and has come off all estrogens.   Husband present for the visit today.  GYNECOLOGIC HISTORY: No LMP recorded. Patient is postmenopausal. Contraception: PMP Menopausal hormone therapy:  Aygestin 24m Last mammogram:  06-14-18 3D/Neg/density A/BiRads2--patient to schedule in 05/2020 Last pap smear: 06-20-19 Neg:Neg HR HPV,         OB History    Gravida  0   Para  0   Term  0   Preterm  0   AB  0   Living  0     SAB  0   TAB  0   Ectopic  0   Multiple  0   Live Births   0              Patient Active Problem List   Diagnosis Date Noted  . Leg swelling 02/27/2020  . Hyperlipidemia 02/27/2020  . Head pain 10/25/2019  . Elevated LFTs 10/25/2019  . Aortic atherosclerosis ([place] 08/22/2019  . NSVT (nonsustained ventricular tachycardia) ([facility] 07/10/2019  . Right lower quadrant abdominal pain 07/08/2019  . Simple endometrial hyperplasia 06/23/2019  . Anticoagulated 06/23/2019  . Postmenopausal bleeding 04/03/2019  . Cough variant asthma vs UACS from gerd 01/01/2018  . PAC (premature atrial contraction) 08/17/2017  . PVC (premature ventricular contraction) 08/17/2017  . PSVT (paroxysmal supraventricular tachycardia) ([facility] 08/17/2017  . Leg edema 05/19/2017  . Other fatigue 12/10/2016  . Myocardial bridge 12/10/2016  . Coronary artery calcification 12/10/2016  . History of pulmonary embolism 12/10/2016  . GERD (gastroesophageal reflux disease) 07/12/2016  . Shortness of breath 05/31/2016  . Chronic venous insufficiency 05/31/2016  . UC (ulcerative colitis) ([facility] 01/28/2015  . Routine general medical examination at a health care facility 06/27/2014  . Left medial knee pain 07/17/2013  . Adrenal incidentaloma ([facility] 03/25/2013  . Allergic rhinitis 09/13/2011  . B12 deficiency 11/28/2008  . Vitamin D deficiency 06/30/2008  . Palpitations 06/30/2008  .  Morbid obesity ([place]) 10/15/2007  . Anxiety about health 08/23/2007  . Cardiac dysrhythmia 08/23/2007    Past Medical History:  Diagnosis Date  . Adrenal nodule ([place])   . Allergy   . Anemia    in her early twenties  . Anxiety   . Arthritis    knees,thumbs  . Cardiac arrhythmia    palpitations,PVC'sAC  . Cataract    bilateral removed   . Cyst of kidney, acquired    cyst right kidney- benign  . Degenerative disc disease, lumbar   . Dyspnea   . Edema   . Gallstones   . History of anemia    20 yrs. ago not now- pt denies   . Morbid obesity ([place])   . Osteoarthritis   . Other allergy,  other than to medicinal agents   . Panic disorder   . Pulmonary embolism ([place])   . Run of atrial premature complexes   . Ulcerative colitis, unspecified   . Vitamin B12 deficiency   . Vitamin D deficiency     Past Surgical History:  Procedure Laterality Date  . BREAST BIOPSY  11/2011   Left- benign  . CATARACT EXTRACTION Right   . CATARACT EXTRACTION Left   . CHOLECYSTECTOMY N/A 01/17/2014   Procedure: LAPAROSCOPIC CHOLECYSTECTOMY;  Surgeon: Excell Seltzer, MD;  Location: [facility];  Service: General;  Laterality: N/A;  . COLONOSCOPY    . DILATATION & CURETTAGE/HYSTEROSCOPY WITH MYOSURE N/A 12/23/2019   Procedure: DILATATION & CURETTAGE, HYSTEROSCOPY WITH MYOSURE, RESECTION OF POLYPOID TISSUE;  Surgeon: Megan Salon, MD;  Location: [facility];  Service: Gynecology;  Laterality: N/A;  . RETINAL LASER PROCEDURE Left   . SIGMOIDOSCOPY      Current Outpatient Medications  Medication Sig Dispense Refill  . acetaminophen (TYLENOL) 500 MG tablet Take 1,000 mg by mouth every 6 (six) hours as needed for moderate pain or headache.    . ALPRAZolam (XANAX) 1 MG tablet TAKE 1/2 TO 1 TABLET BY MOUTH 2 TIMES DAILY AS NEEDED FOR ANXIETY. 60 tablet 0  . ELIQUIS 5 MG TABS tablet TAKE 1 TABLET BY MOUTH TWICE DAILY 60 tablet 6  . loperamide (IMODIUM A-D) 2 MG tablet Take 2 mg by mouth 4 (four) times daily as needed for diarrhea or loose stools.    . norethindrone (AYGESTIN) 5 MG tablet TAKE 2 TABLETS(10 MG) BY MOUTH DAILY (Patient taking differently: Take 5 mg by mouth daily. ) 60 tablet 5  . Polyethylene Glycol 400 (VISINE DRY EYE RELIEF OP) Place 1 drop into both eyes daily as needed (dry eye).    . Probiotic Product (PROBIOTIC-10 PO) Take 2 tablets by mouth daily.    . Simethicone (GAS-X PO) Take 2 tablets by mouth 4 (four) times daily as needed (For gas.). Do not exceed 6 tablets in a 24 hour period.     Marland Kitchen atenolol (TENORMIN) 25 MG tablet Take 0.5 tablets (12.5 mg total) by mouth 2 (two) times daily. 90  tablet 2  . metroNIDAZOLE (FLAGYL) 500 MG tablet Take 1 tablet (500 mg total) by mouth 3 (three) times daily. 21 tablet 0   No current facility-administered medications for this visit.     ALLERGIES: Doxycycline, Hydrocodone, Sudafed [pseudoephedrine hcl], Acular [ketorolac tromethamine], Codeine, Diphenhydramine hcl, Lactose intolerance (gi), Mesalamine, Sulfasalazine, Azithromycin, Caffeine, Penicillins, and Prednisone  Family History  Problem Relation Age of Onset  . Heart disease Father 2       pacemaker  . Atrial fibrillation Father   . Hypertension Maternal  Aunt   . Hypertension Maternal Grandfather   . Colon cancer Neg Hx   . Stomach cancer Neg Hx   . Colon polyps Neg Hx   . Esophageal cancer Neg Hx   . Rectal cancer Neg Hx     Social History   Socioeconomic History  . Marital status: Married    Spouse name: Not on file  . Number of children: 0  . Years of education: Not on file  . Highest education level: Not on file  Occupational History  . Occupation: Arboriculturist: UNEMPLOYED  Tobacco Use  . Smoking status: Former Smoker    Packs/day: 2.00    Years: 12.00    Pack years: 24.00    Types: Cigarettes    Quit date: 03/07/1979    Years since quitting: 41.1  . Smokeless tobacco: Never Used  Vaping Use  . Vaping Use: Never used  Substance and Sexual Activity  . Alcohol use: No    Alcohol/week: 0.0 standard drinks  . Drug use: No  . Sexual activity: Not Currently    Partners: Male    Birth control/protection: Post-menopausal  Other Topics Concern  . Not on file  Social History Narrative   Married: 0 kids   Regular exercise: walks 2 times a week   Caffeine use: none   Social Determinants of Health   Financial Resource Strain:   . Difficulty of Paying Living Expenses: Not on file  Food Insecurity:   . Worried About Charity fundraiser in the Last Year: Not on file  . Ran Out of Food in the Last Year: Not on file  Transportation Needs:   .  Lack of Transportation (Medical): Not on file  . Lack of Transportation (Non-Medical): Not on file  Physical Activity:   . Days of Exercise per Week: Not on file  . Minutes of Exercise per Session: Not on file  Stress:   . Feeling of Stress : Not on file  Social Connections:   . Frequency of Communication with Friends and Family: Not on file  . Frequency of Social Gatherings with Friends and Family: Not on file  . Attends Religious Services: Not on file  . Active Member of Clubs or Organizations: Not on file  . Attends Archivist Meetings: Not on file  . Marital Status: Not on file  Intimate Partner Violence:   . Fear of Current or Ex-Partner: Not on file  . Emotionally Abused: Not on file  . Physically Abused: Not on file  . Sexually Abused: Not on file    Review of Systems  Genitourinary: Positive for pelvic pain (low mid).  All other systems reviewed and are negative.   PHYSICAL EXAMINATION:    BP 136/72 (Cuff Size: Large)   Pulse 61   SpO2 97%     General appearance: alert, cooperative and appears stated age   Pelvic: External genitalia:  no lesions              Urethra:  normal appearing urethra with no masses, tenderness or lesions              Bartholins and Skenes: normal                 Vagina: normal appearing vagina with normal color and minimal white discharge, no lesions              Cervix: unable to see.  No blood.  Bimanual Exam:  Not performed.  Unable to complete pelvic exam due to leg cramping.   Chaperone was present for exam.  ASSESSMENT  Vaginal discharge.   Potential recent vaginal bleeding. Hx PE.  On Eliquis.  On Aygestin for hx simple endometrial hyperplasia.  On 5 mg once daily.  10 mg caused elevated LFTs  Status post dilation and curettage for endometrial hyperplasia.   Final pathology negative.  Chronic pelvic pain.  Unclear etiology.  May be uterine in origin.  Premenopausal by [facility] and E2.    Leg cramps.   Unable to complete pelvic exam.   PLAN  Comprehensive chart review performed with patient and her husband.  We discussed options for care including: Pelvic US versus FU with Dr. Sabra Heck versus GYN [place] consultation.  They will let me know what she would like to do.  Continue Aygestin 5 mg daily.  Affirm testing done to rule out vaginitis.   64 minutes total time was spent for this patient encounter, including preparation, face-to-face counseling with the patient, coordination of care, and documentation of the encounter.

## 2020-04-09 LAB — VAGINITIS/VAGINOSIS, DNA PROBE
Candida Species: NEGATIVE
Gardnerella vaginalis: POSITIVE — AB
Trichomonas vaginosis: NEGATIVE

## 2020-04-10 ENCOUNTER — Encounter: Payer: Self-pay | Admitting: Obstetrics and Gynecology

## 2020-04-10 ENCOUNTER — Telehealth: Payer: Self-pay

## 2020-04-10 MED ORDER — METRONIDAZOLE 0.75 % VA GEL: 1.0000 | Freq: Every day | VAGINAL | 0 refills | Status: AC

## 2020-04-10 NOTE — Telephone Encounter (Signed)
Spoke with pt. Pt given results and recommendations per Dr Quincy Simmonds.  Pt agreeable to Metrogel Rx and verbalized understanding. ETOH precautions reviewed.  Rx sent to pharmacy on file.  Encounter closed    [name], MD  04/10/2020 1:45 PM EST Back to Top    Please contact patient with Affirm result showing bacterial vaginosis, a form of vaginal infection which is not sexually transmitted and can occur spontaneously.  I did read her My Chart message as well.   She may treat with Metrogel pv at hs for 5 nights.  Please send Rx to pharmacy of choice. ETOH precautions.   It is ok to receive her Covid booster while taking this medication.

## 2020-04-10 NOTE — Telephone Encounter (Signed)
Patient wants to speak with the nurse she got her results from my chart. Patient is scheduled to get the Covid booster shot tomorrow. She wants to know if it is ok to get the shot with an active infection.

## 2020-04-11 ENCOUNTER — Ambulatory Visit: Payer: 59 | Attending: Internal Medicine

## 2020-04-11 DIAGNOSIS — Z23 Encounter for immunization: Secondary | ICD-10-CM

## 2020-04-11 NOTE — Progress Notes (Signed)
   Covid-19 Vaccination Clinic  Name:  Cassie [name]    MRN: [id number] DOB: [birth year]  04/11/2020  Ms. Demattia was observed post Covid-19 immunization for 15 minutes without incident. She was provided with Vaccine Information Sheet and instruction to access the V-Safe system.   Ms. Lewis was instructed to call 911 with any severe reactions post vaccine: Marland Kitchen Difficulty breathing  . Swelling of face and throat  . A fast heartbeat  . A bad rash all over body  . Dizziness and weakness   Immunizations Administered    Name Date Dose VIS Date Route   Pfizer COVID-19 Vaccine 04/11/2020 11:53 AM 0.3 mL 02/26/2020 Intramuscular   Manufacturer: [place]   Lot: [id number]   [place]: [id number]

## 2020-04-21 ENCOUNTER — Telehealth: Payer: Self-pay

## 2020-04-21 NOTE — Telephone Encounter (Signed)
Pt sent the following mychart message:  Nigel, Wessman, RN Hi, I finished the metrogel on Friday night. I did have discharge while using it that was different than my usual. I would often have small dots of a really dark color, and spots that are a brownish pink, sometimes leaning toward an almost purple tinge. I also having a light to medium brown color. I did see light pink blood several times, especially when wiping. I still have the brownish color this morning. I thought this would be gone after being off the medicine for a few days, but it's not. I am still having soreness in my lower belly and bad cramping that seems to start everyday around 4 or 5ish. It will last until about 11 or midnight, even if I take Tylenol. Is this all normal after the metrogel? Just wanted to fill you in on what's going on. Thanks

## 2020-04-21 NOTE — Telephone Encounter (Signed)
OV-11/30 +BV 04/08/20  Spoke with pt. Pt states having increased discharge over weekend after finishing Metrogel on Friday. Pt denies vaginal odor, itching, UTI sx. Pt states having light pink/brown discharge and cramping that is lasting 8 hours daily even with taking Tylenol.  Pt concerned that BV is not gone.  Advised pt to have OV for follow up and exam. Pt agreeable. Pt scheduled with Dr Quincy Simmonds on 12/15 at 4 pm. Pt agreeable to date and time of appt.  Routing to Dr Quincy Simmonds for review Encounter closed

## 2020-04-22 ENCOUNTER — Ambulatory Visit: Payer: 59 | Admitting: Obstetrics and Gynecology

## 2020-04-22 ENCOUNTER — Other Ambulatory Visit: Payer: Self-pay

## 2020-04-22 ENCOUNTER — Encounter: Payer: Self-pay | Admitting: Obstetrics and Gynecology

## 2020-04-22 VITALS — BP 138/78 | HR 62

## 2020-04-22 DIAGNOSIS — N760 Acute vaginitis: Secondary | ICD-10-CM

## 2020-04-22 DIAGNOSIS — R102 Pelvic and perineal pain: Secondary | ICD-10-CM

## 2020-04-22 NOTE — Progress Notes (Signed)
GYNECOLOGY  VISIT   HPI: 59 y.o.   Married  Caucasian  female   G0P0000 with No LMP recorded. Patient is postmenopausal.   here for pink/brown discharge and cramping. She just finished Metrogel for BV on Friday, which was 5 days ago.  She is being treated with Aygestin for a history of simple endometrial hyperplasia.  Husband is present for the entire visit.   Discharge can be purple, clear, and yellow.  She has noticed some blood and dark flecks.  Pelvic cramping starts in the afternoon between 4 and 5 and stops at 11 pm.  Takes Aygestin around 6:00 - 6:30 pm nightly.   Hx colitis and gassy feeling.  She will follow up with her PCP and will ask for a CT.  She is overdue for colonoscopy per patient.  Has a BM in the am and sometimes in the evening.   No dysuria or urgency.   GYNECOLOGIC HISTORY: No LMP recorded. Patient is postmenopausal. Contraception:  PMP Menopausal hormone therapy:  Aygestin 68m Last mammogram: 06-14-18 3D/Neg/density A/BiRads2--patient to schedule in 05/2020 Last pap smear: 06-20-19 Neg:Neg HR HPV        OB History    Gravida  0   Para  0   Term  0   Preterm  0   AB  0   Living  0     SAB  0   IAB  0   Ectopic  0   Multiple  0   Live Births  0              Patient Active Problem List   Diagnosis Date Noted  . Leg swelling 02/27/2020  . Hyperlipidemia 02/27/2020  . Head pain 10/25/2019  . Elevated LFTs 10/25/2019  . Aortic atherosclerosis ([facility] 08/22/2019  . NSVT (nonsustained ventricular tachycardia) ([facility] 07/10/2019  . Right lower quadrant abdominal pain 07/08/2019  . Simple endometrial hyperplasia 06/23/2019  . Anticoagulated 06/23/2019  . Postmenopausal bleeding 04/03/2019  . Cough variant asthma vs UACS from gerd 01/01/2018  . PAC (premature atrial contraction) 08/17/2017  . PVC (premature ventricular contraction) 08/17/2017  . PSVT (paroxysmal supraventricular tachycardia) ([facility] 08/17/2017  . Leg edema 05/19/2017  .  Other fatigue 12/10/2016  . Myocardial bridge 12/10/2016  . Coronary artery calcification 12/10/2016  . History of pulmonary embolism 12/10/2016  . GERD (gastroesophageal reflux disease) 07/12/2016  . Shortness of breath 05/31/2016  . Chronic venous insufficiency 05/31/2016  . UC (ulcerative colitis) ([facility] 01/28/2015  . Routine general medical examination at a health care facility 06/27/2014  . Left medial knee pain 07/17/2013  . Adrenal incidentaloma ([place] 03/25/2013  . Allergic rhinitis 09/13/2011  . B12 deficiency 11/28/2008  . Vitamin D deficiency 06/30/2008  . Palpitations 06/30/2008  . Morbid obesity ([facility] 10/15/2007  . Anxiety about health 08/23/2007  . Cardiac dysrhythmia 08/23/2007    Past Medical History:  Diagnosis Date  . Adrenal nodule ([place]   . Allergy   . Anemia    in her early twenties  . Anxiety   . Arthritis    knees,thumbs  . Cardiac arrhythmia    palpitations,PVC'sAC  . Cataract    bilateral removed   . Cyst of kidney, acquired    cyst right kidney- benign  . Degenerative disc disease, lumbar   . Dyspnea   . Edema   . Gallstones   . History of anemia    20 yrs. ago not now- pt denies   . Morbid obesity ([facility]   .  Osteoarthritis   . Other allergy, other than to medicinal agents   . Panic disorder   . Pulmonary embolism ([place])   . Run of atrial premature complexes   . Ulcerative colitis, unspecified   . Vitamin B12 deficiency   . Vitamin D deficiency     Past Surgical History:  Procedure Laterality Date  . BREAST BIOPSY  11/2011   Left- benign  . CATARACT EXTRACTION Right   . CATARACT EXTRACTION Left   . CHOLECYSTECTOMY N/A 01/17/2014   Procedure: LAPAROSCOPIC CHOLECYSTECTOMY;  Surgeon: Excell Seltzer, MD;  Location: [facility];  Service: General;  Laterality: N/A;  . COLONOSCOPY    . DILATATION & CURETTAGE/HYSTEROSCOPY WITH MYOSURE N/A 12/23/2019   Procedure: DILATATION & CURETTAGE, HYSTEROSCOPY WITH MYOSURE, RESECTION OF POLYPOID TISSUE;   Surgeon: Megan Salon, MD;  Location: [facility];  Service: Gynecology;  Laterality: N/A;  . RETINAL LASER PROCEDURE Left   . SIGMOIDOSCOPY      Current Outpatient Medications  Medication Sig Dispense Refill  . acetaminophen (TYLENOL) 500 MG tablet Take 1,000 mg by mouth every 6 (six) hours as needed for moderate pain or headache.    . ALPRAZolam (XANAX) 1 MG tablet TAKE 1/2 TO 1 TABLET BY MOUTH 2 TIMES DAILY AS NEEDED FOR ANXIETY. 60 tablet 0  . ELIQUIS 5 MG TABS tablet TAKE 1 TABLET BY MOUTH TWICE DAILY 60 tablet 6  . loperamide (IMODIUM A-D) 2 MG tablet Take 2 mg by mouth 4 (four) times daily as needed for diarrhea or loose stools.    . norethindrone (AYGESTIN) 5 MG tablet TAKE 2 TABLETS(10 MG) BY MOUTH DAILY (Patient taking differently: Take 5 mg by mouth daily.) 60 tablet 5  . Polyethylene Glycol 400 (VISINE DRY EYE RELIEF OP) Place 1 drop into both eyes daily as needed (dry eye).    . Probiotic Product (PROBIOTIC-10 PO) Take 2 tablets by mouth daily.    . Simethicone (GAS-X PO) Take 2 tablets by mouth 4 (four) times daily as needed (For gas.). Do not exceed 6 tablets in a 24 hour period.    Marland Kitchen atenolol (TENORMIN) 25 MG tablet Take 0.5 tablets (12.5 mg total) by mouth 2 (two) times daily. 90 tablet 2   No current facility-administered medications for this visit.     ALLERGIES: Doxycycline, Hydrocodone, Sudafed [pseudoephedrine hcl], Acular [ketorolac tromethamine], Codeine, Diphenhydramine hcl, Lactose intolerance (gi), Mesalamine, Sulfasalazine, Azithromycin, Caffeine, Penicillins, and Prednisone  Family History  Problem Relation Age of Onset  . Heart disease Father 55       pacemaker  . Atrial fibrillation Father   . Hypertension Maternal Aunt   . Hypertension Maternal Grandfather   . Colon cancer Neg Hx   . Stomach cancer Neg Hx   . Colon polyps Neg Hx   . Esophageal cancer Neg Hx   . Rectal cancer Neg Hx     Social History   Socioeconomic History  . Marital status: Married     Spouse name: Not on file  . Number of children: 0  . Years of education: Not on file  . Highest education level: Not on file  Occupational History  . Occupation: Arboriculturist: UNEMPLOYED  Tobacco Use  . Smoking status: Former Smoker    Packs/day: 2.00    Years: 12.00    Pack years: 24.00    Types: Cigarettes    Quit date: 03/07/1979    Years since quitting: 41.1  . Smokeless tobacco: Never Used  Vaping Use  .  Vaping Use: Never used  Substance and Sexual Activity  . Alcohol use: No    Alcohol/week: 0.0 standard drinks  . Drug use: No  . Sexual activity: Not Currently    Partners: Male    Birth control/protection: Post-menopausal  Other Topics Concern  . Not on file  Social History Narrative   Married: 0 kids   Regular exercise: walks 2 times a week   Caffeine use: none   Social Determinants of Radio broadcast assistant Strain: Not on file  Food Insecurity: Not on file  Transportation Needs: Not on file  Physical Activity: Not on file  Stress: Not on file  Social Connections: Not on file  Intimate Partner Violence: Not on file    Review of Systems  All other systems reviewed and are negative.   PHYSICAL EXAMINATION:    BP 138/78 (Cuff Size: Large)   Pulse 62   SpO2 98%     General appearance: alert, cooperative and appears stated age  Pelvic: External genitalia:  no lesions              Urethra:  normal appearing urethra with no masses, tenderness or lesions              Bartholins and Skenes: normal                 Vagina: normal appearing vagina with normal color and discharge, no lesions, no blood.              Cervix: no lesions                Bimanual Exam:  Uterus:  normal size, contour, position, consistency, mobility, non-tender              Adnexa: no mass, fullness, tenderness             Chaperone was present for exam.  ASSESSMENT  Vaginitis.  Recent BV. Chronic pelvic pain preceding her diagnosis of endometrial hyperplasia  at outside office.   Hx simple endometrial hyperplasia.  Resolved on pathology report from dilation and curettage in August, 2021.  Maintained on Aygestin 5 mg daily.  FSH 8.3 and estradiol 47.6 on 03/06/20. Hx PE.  On Eliquis.  Hx colitis.    PLAN  [place] testing.  She will continue her Aygestin.  We have discussed pelvic ultrasound option if she develops clear vaginal bleeding. Follow up with GI.  31 min  total time was spent for this patient encounter, including preparation, face-to-face counseling with the patient, coordination of care, and documentation of the encounter.

## 2020-04-26 LAB — NUSWAB VAGINITIS (VG)
Candida albicans, NAA: NEGATIVE
Candida glabrata, NAA: NEGATIVE
Trich vag by NAA: NEGATIVE

## 2020-04-27 ENCOUNTER — Encounter: Payer: Self-pay | Admitting: Obstetrics and Gynecology

## 2020-04-27 ENCOUNTER — Encounter: Payer: Self-pay | Admitting: Internal Medicine

## 2020-04-27 ENCOUNTER — Telehealth: Payer: Self-pay

## 2020-04-27 DIAGNOSIS — N9489 Other specified conditions associated with female genital organs and menstrual cycle: Secondary | ICD-10-CM

## 2020-04-27 NOTE — Telephone Encounter (Signed)
Routing to Dr Quincy Simmonds for update. Please advise

## 2020-04-27 NOTE — Telephone Encounter (Signed)
Spoke with pt. Pt given update and recommendations per Dr Quincy Simmonds for PUS. Pt verbalized understanding and agreeable to schedule. PUS scheduled for 05/21/20 at 930 am with Dr Quincy Simmonds. Pt was scheduled with first available appt. Pt denies any vaginal bleeding at this time. Routing to Dr Quincy Simmonds for update  Encounter closed  Cc: Hayley for precert  Orders placed

## 2020-04-27 NOTE — Telephone Encounter (Signed)
Cassie [name]  P [facility] Clinical Pool Dr.Silva, thank you for the note on the test results. I'm glad everything was negative. I'm still perplexed however that I am still having the discharge in varying colors and amounts throughout each day and night. This light to medium brownish color only started when I was on the metrogel. It has been well over a week since I last used it. I am still having bouts of the horrible cramping everyday. I had some really severe ones last evening, thankfully they only stayed with me a couple of hours. I am try to get in touch with my primary care Dr to see if she can order an abdominal and pelvic ultrasound. But, with having this discharge continuing, I feel something has to still be going on in the uterus also. I haven't saw blood, if I do, I will contact you. Have a Very, Merry Christmas.

## 2020-04-27 NOTE — Telephone Encounter (Signed)
I welcome her to have a pelvic ultrasound so we can evaluate the lining of her uterus.  This could be done in the office or at [facility].

## 2020-05-03 ENCOUNTER — Other Ambulatory Visit: Payer: Self-pay | Admitting: Internal Medicine

## 2020-05-04 NOTE — Telephone Encounter (Signed)
[place] Controlled Database Checked Last filled:  03/02/20  3 60 LOV w/you:  03/24/20 acute Next appt w/you: 05/11/20

## 2020-05-05 ENCOUNTER — Encounter: Payer: Self-pay | Admitting: Obstetrics and Gynecology

## 2020-05-05 ENCOUNTER — Other Ambulatory Visit: Payer: 59

## 2020-05-06 ENCOUNTER — Telehealth: Payer: Self-pay

## 2020-05-06 NOTE — Telephone Encounter (Signed)
Clance Boll R  P [facility] Pool Here are pics I took a few minutes ago. The larger amount is what was on the paper when I removed it. It looks more of brown tones than what the camera color depicts. The other is the blood I've been seeing when wiping.

## 2020-05-06 NOTE — Telephone Encounter (Signed)
Cassie [name], Cassie [name] Clinical Pool Hi Dr Quincy Simmonds, this past week of Christmas, I noticed a couple of times some small amounts of fresh pink blood when wiping. I am still noticing different shades from very light to darker colors of discharge. Sometimes very little to none, sometimes bigger amounts, it varies during the day. I have had this new discharge since taking the metrogel. I have been noticing more blood the past couple of days. Almost every time I've gone to the bathroom today. I've had horrible cramps sometimes 12 hours or longer every day since our last visit. Seems only several hours after waking up, I will get a break. My pelvic area feels just like having a period today. Cramps most of the day, not major yet, but they build up and get worse. I know I have the ultrasound scheduled for the 13th, but you asked to let you know if I see blood. It's not major bleeding by any means. I am wondering if the brownish tones I see, although they are wet are still dried blood also? Very hard to tell. Wanted to  update you. I pray we can find out the cause of this. The daily pain is really getting to me.  Thanks  Cassie [name]

## 2020-05-06 NOTE — Telephone Encounter (Signed)
Routing to Dr. Quincy Simmonds to advise on plan of care.

## 2020-05-06 NOTE — Telephone Encounter (Signed)
Call reviewed with Dr. Quincy Simmonds.  Call returned to patient. Advised to keep PUS appt scheduled for 05/21/20.  Will see PCP on 05/11/20.  Patient verbalizes understanding and is agreeable.   Routing to provider for final review. Patient is agreeable to disposition. Will close encounter.

## 2020-05-11 ENCOUNTER — Ambulatory Visit: Payer: 59 | Admitting: Internal Medicine

## 2020-05-19 ENCOUNTER — Encounter: Payer: Self-pay | Admitting: Internal Medicine

## 2020-05-19 ENCOUNTER — Telehealth (INDEPENDENT_AMBULATORY_CARE_PROVIDER_SITE_OTHER): Payer: 59 | Admitting: Internal Medicine

## 2020-05-19 DIAGNOSIS — R079 Chest pain, unspecified: Secondary | ICD-10-CM | POA: Diagnosis not present

## 2020-05-19 DIAGNOSIS — R1031 Right lower quadrant pain: Secondary | ICD-10-CM | POA: Diagnosis not present

## 2020-05-19 DIAGNOSIS — R0602 Shortness of breath: Secondary | ICD-10-CM | POA: Diagnosis not present

## 2020-05-19 DIAGNOSIS — N950 Postmenopausal bleeding: Secondary | ICD-10-CM

## 2020-05-19 NOTE — Progress Notes (Signed)
Virtual Visit via Video Note  I connected with Cassie [name] on 05/19/20 at  4:00 PM EST by a video enabled telemedicine application and verified that I am speaking with the correct person using two identifiers.  The patient and the provider were at separate locations throughout the entire encounter. Patient location: home, Provider location: work   I discussed the limitations of evaluation and management by telemedicine and the availability of in person appointments. The patient expressed understanding and agreed to proceed. The patient and the provider were the only parties present for the visit unless noted in HPI below.  History of Present Illness: The patient is a 60 y.o. female with visit for several concerns including endometrial problems (pain since biopsy in June 2021, bleeding off and on since that time, having bleeding still, D and C August which did not clear up the problem, having [place] in 2 days), and diarrhea (did not take flagyl after last visit, still having a lot of gas and diarrhea daily, lost weight due to eating smaller and bland foods, overdue for GI follow up and likely colonoscopy), SOB (stable overall but gradually progressive since PE 2018, her hematologist was concerned about chronic PE and recommended CT angio chest which she preferred to have [place] order)  Observations/Objective: Appearance: stable, breathing appears stable, speaking in full senteces, casual grooming, abdomen does not appear distended, throat not well visualized, mental status is A and O times 3  Assessment and Plan: See problem oriented charting  Follow Up Instructions: ordered CT angio chest, CT abd/pelvis, BMP  I discussed the assessment and treatment plan with the patient. The patient was provided an opportunity to ask questions and all were answered. The patient agreed with the plan and demonstrated an understanding of the instructions.   The patient was advised to call back or seek an in-person  evaluation if the symptoms worsen or if the condition fails to improve as anticipated.  Hoyt Koch, MD

## 2020-05-20 ENCOUNTER — Other Ambulatory Visit: Payer: Self-pay | Admitting: *Deleted

## 2020-05-20 MED ORDER — ATENOLOL 25 MG PO TABS: 12.5000 mg | ORAL_TABLET | Freq: Two times a day (BID) | ORAL | 0 refills | Status: DC

## 2020-05-21 ENCOUNTER — Other Ambulatory Visit: Payer: Self-pay | Admitting: Obstetrics and Gynecology

## 2020-05-21 ENCOUNTER — Ambulatory Visit (INDEPENDENT_AMBULATORY_CARE_PROVIDER_SITE_OTHER): Payer: 59

## 2020-05-21 ENCOUNTER — Encounter: Payer: Self-pay | Admitting: Obstetrics and Gynecology

## 2020-05-21 ENCOUNTER — Other Ambulatory Visit: Payer: Self-pay

## 2020-05-21 ENCOUNTER — Ambulatory Visit (INDEPENDENT_AMBULATORY_CARE_PROVIDER_SITE_OTHER): Payer: 59 | Admitting: Obstetrics and Gynecology

## 2020-05-21 VITALS — BP 130/76 | HR 60 | Ht 65.0 in

## 2020-05-21 DIAGNOSIS — N939 Abnormal uterine and vaginal bleeding, unspecified: Secondary | ICD-10-CM | POA: Diagnosis not present

## 2020-05-21 DIAGNOSIS — N9489 Other specified conditions associated with female genital organs and menstrual cycle: Secondary | ICD-10-CM

## 2020-05-21 DIAGNOSIS — N8501 Benign endometrial hyperplasia: Secondary | ICD-10-CM

## 2020-05-21 NOTE — Progress Notes (Signed)
GYNECOLOGY  VISIT   HPI: 60 y.o.   Married  Caucasian  female   G0P0000 with Patient's last menstrual period was 05/09/2010 (approximate).   here for pelvic ultrasound.   Husband is present for the visit today.   Patient has had vaginal bleeding and discharge.  She has been treated for bacterial vaginosis.  She did have some more obvious bleeding 2 weeks ago.  She has cramping which is mild and can increase to being pain.   She is being treated with Aygestin for a history of simple endometrial hyperplasia previously diagnosed at [facility].  She had a dilation and curettage in August, and her pathology specimen showed microglandular adenosis, inflamed with squamous metaplasia.   No malignancy was identified.  She has difficulty with office pelvic exams and tolerating stirrups due to leg cramping.   She does wish for a vaginal hysterectomy if possible.  She has concerns about ovarian cancer and questions about her history of endometrial hyperplasia.  She stopped FemHRT in past when she had a PE. She is on Eliquis 5 mg po bid.   Labs on 03/06/20: Estradiol 47.6. FSH 8.3.  Hx colitis.  Has not seen GI yet.  Patient will have a chest, abdomen and pelvic CT through PCP due to her chest and abdominal pain.    GYNECOLOGIC HISTORY: Patient's last menstrual period was 05/09/2010 (approximate). Contraception:  NA. Menopausal hormone therapy:  Aygestin. Last mammogram:  06-14-18 3D/Neg/density A/BiRads2 Last pap smear:   06-20-19 Neg:Neg HR HPV        OB History    Gravida  0   Para  0   Term  0   Preterm  0   AB  0   Living  0     SAB  0   IAB  0   Ectopic  0   Multiple  0   Live Births  0              Patient Active Problem List   Diagnosis Date Noted  . Leg swelling 02/27/2020  . Hyperlipidemia 02/27/2020  . Head pain 10/25/2019  . Elevated LFTs 10/25/2019  . Aortic atherosclerosis ([place]) 08/22/2019  . NSVT (nonsustained ventricular tachycardia)  ([place]) 07/10/2019  . Right lower quadrant abdominal pain 07/08/2019  . Simple endometrial hyperplasia 06/23/2019  . Anticoagulated 06/23/2019  . Postmenopausal bleeding 04/03/2019  . Cough variant asthma vs UACS from gerd 01/01/2018  . PAC (premature atrial contraction) 08/17/2017  . PVC (premature ventricular contraction) 08/17/2017  . PSVT (paroxysmal supraventricular tachycardia) ([place]) 08/17/2017  . Leg edema 05/19/2017  . Other fatigue 12/10/2016  . Myocardial bridge 12/10/2016  . Coronary artery calcification 12/10/2016  . History of pulmonary embolism 12/10/2016  . GERD (gastroesophageal reflux disease) 07/12/2016  . Shortness of breath 05/31/2016  . Chronic venous insufficiency 05/31/2016  . UC (ulcerative colitis) ([place]) 01/28/2015  . Routine general medical examination at a health care facility 06/27/2014  . Left medial knee pain 07/17/2013  . Adrenal incidentaloma ([place]) 03/25/2013  . Allergic rhinitis 09/13/2011  . B12 deficiency 11/28/2008  . Vitamin D deficiency 06/30/2008  . Palpitations 06/30/2008  . Morbid obesity ([place]) 10/15/2007  . Anxiety about health 08/23/2007  . Cardiac dysrhythmia 08/23/2007    Past Medical History:  Diagnosis Date  . Adrenal nodule ([place])   . Allergy   . Anemia    in her early twenties  . Anxiety   . Arthritis    knees,thumbs  . Cardiac arrhythmia  palpitations,PVC'sAC  . Cataract    bilateral removed   . Cyst of kidney, acquired    cyst right kidney- benign  . Degenerative disc disease, lumbar   . Dyspnea   . Edema   . Gallstones   . History of anemia    20 yrs. ago not now- pt denies   . Morbid obesity ([place])   . Osteoarthritis   . Other allergy, other than to medicinal agents   . Panic disorder   . Pulmonary embolism ([place])   . Run of atrial premature complexes   . Ulcerative colitis, unspecified   . Vitamin B12 deficiency   . Vitamin D deficiency     Past Surgical History:  Procedure Laterality Date  . BREAST  BIOPSY  11/2011   Left- benign  . CATARACT EXTRACTION Right   . CATARACT EXTRACTION Left   . CHOLECYSTECTOMY N/A 01/17/2014   Procedure: LAPAROSCOPIC CHOLECYSTECTOMY;  Surgeon: Excell Seltzer, MD;  Location: [facility];  Service: General;  Laterality: N/A;  . COLONOSCOPY    . DILATATION & CURETTAGE/HYSTEROSCOPY WITH MYOSURE N/A 12/23/2019   Procedure: DILATATION & CURETTAGE, HYSTEROSCOPY WITH MYOSURE, RESECTION OF POLYPOID TISSUE;  Surgeon: Megan Salon, MD;  Location: [facility];  Service: Gynecology;  Laterality: N/A;  . RETINAL LASER PROCEDURE Left   . SIGMOIDOSCOPY      Current Outpatient Medications  Medication Sig Dispense Refill  . acetaminophen (TYLENOL) 500 MG tablet Take 1,000 mg by mouth every 6 (six) hours as needed for moderate pain or headache.    . ALPRAZolam (XANAX) 1 MG tablet TAKE 1/2 TO 1 TABLET BY MOUTH 2 TIMES DAILY AS NEEDED FOR ANXIETY. 60 tablet 2  . atenolol (TENORMIN) 25 MG tablet Take 0.5 tablets (12.5 mg total) by mouth 2 (two) times daily. 90 tablet 0  . ELIQUIS 5 MG TABS tablet TAKE 1 TABLET BY MOUTH TWICE DAILY 60 tablet 6  . loperamide (IMODIUM A-D) 2 MG tablet Take 2 mg by mouth 4 (four) times daily as needed for diarrhea or loose stools.    . norethindrone (AYGESTIN) 5 MG tablet TAKE 2 TABLETS(10 MG) BY MOUTH DAILY (Patient taking differently: Take 5 mg by mouth daily.) 60 tablet 5  . Polyethylene Glycol 400 (VISINE DRY EYE RELIEF OP) Place 1 drop into both eyes daily as needed (dry eye).    . Probiotic Product (PROBIOTIC-10 PO) Take 2 tablets by mouth daily.    . Simethicone (GAS-X PO) Take 2 tablets by mouth 4 (four) times daily as needed (For gas.). Do not exceed 6 tablets in a 24 hour period.     No current facility-administered medications for this visit.     ALLERGIES: Doxycycline, Hydrocodone, Sudafed [pseudoephedrine hcl], Acular [ketorolac tromethamine], Codeine, Diphenhydramine hcl, Lactose intolerance (gi), Mesalamine, Sulfasalazine, Azithromycin,  Caffeine, Penicillins, and Prednisone  Family History  Problem Relation Age of Onset  . Heart disease Father 68       pacemaker  . Atrial fibrillation Father   . Hypertension Maternal Aunt   . Hypertension Maternal Grandfather   . Colon cancer Neg Hx   . Stomach cancer Neg Hx   . Colon polyps Neg Hx   . Esophageal cancer Neg Hx   . Rectal cancer Neg Hx     Social History   Socioeconomic History  . Marital status: Married    Spouse name: Not on file  . Number of children: 0  . Years of education: Not on file  . Highest education level: Not on  file  Occupational History  . Occupation: Arboriculturist: UNEMPLOYED  Tobacco Use  . Smoking status: Former Smoker    Packs/day: 2.00    Years: 12.00    Pack years: 24.00    Types: Cigarettes    Quit date: 03/07/1979    Years since quitting: 41.2  . Smokeless tobacco: Never Used  Vaping Use  . Vaping Use: Never used  Substance and Sexual Activity  . Alcohol use: No    Alcohol/week: 0.0 standard drinks  . Drug use: No  . Sexual activity: Not Currently    Partners: Male    Birth control/protection: Post-menopausal  Other Topics Concern  . Not on file  Social History Narrative   Married: 0 kids   Regular exercise: walks 2 times a week   Caffeine use: none   Social Determinants of Radio broadcast assistant Strain: Not on file  Food Insecurity: Not on file  Transportation Needs: Not on file  Physical Activity: Not on file  Stress: Not on file  Social Connections: Not on file  Intimate Partner Violence: Not on file    Review of Systems  All other systems reviewed and are negative.   PHYSICAL EXAMINATION:    BP 130/76 (Cuff Size: Large)   Pulse 60   Ht 5' 5"  (1.651 m)   LMP 05/09/2010 (Approximate)   SpO2 99%   BMI 54.58 kg/m     General appearance: alert, cooperative and appears stated age   Pelvic US: Uterus no myometrial masses.  EMS 9.12 mm.  Ovaries normal and poor visualization due to [facility].   No adnexal masses.  No free fluid.  ASSESSMENT  Chronic pelvic pain preceding her diagnosis of endometrial hyperplasia at outside office.   Hx simple endometrial hyperplasia.  Resolved on pathology report from dilation and curettage in August, 2021.  Maintained on Aygestin 5 mg daily.  FSH 8.3 and estradiol 47.6 on 03/06/20. Hx PE.  On Eliquis.  Hx colitis.   PLAN  We did review her pelvic ultrasound images and report.  Check FSH and estradiol. Patient likely does need endometrial sampling and may benefit from a Mirena IUD.  I think it will be helpful at this point for her to see GYN ONC due to her hyperplasia history, potential increased risk of future endometrial cancer, and her medical history.   Ok to continue Aygestin 5 mg daily for now.  44 min total time was spent for this patient encounter, including preparation, face-to-face counseling with the patient, coordination of care, and documentation of the encounter.

## 2020-05-22 ENCOUNTER — Encounter: Payer: Self-pay | Admitting: Obstetrics and Gynecology

## 2020-05-22 ENCOUNTER — Telehealth: Payer: Self-pay | Admitting: *Deleted

## 2020-05-22 LAB — ESTRADIOL: Estradiol: 62 pg/mL

## 2020-05-22 LAB — FOLLICLE STIMULATING HORMONE: FSH: 2.8 m[IU]/mL

## 2020-05-22 NOTE — Assessment & Plan Note (Signed)
Ordered CT angio chest to rule out chronic PE burden. Checking BMP.

## 2020-05-22 NOTE — Assessment & Plan Note (Signed)
Seeing gyn and encouraged to her continue follow up with them as directed and get pelvic US as scheduled.

## 2020-05-22 NOTE — Telephone Encounter (Signed)
Attempted to call the patient to schedule a new patient appt; left a message for the patient to call the office back.

## 2020-05-22 NOTE — Assessment & Plan Note (Signed)
Advised to keep pelvic ultrasound with gyn and we will order CT abdomen and pelvis with contrast to rule out diverticulitis or colitis flare. She is advised to follow up with GI when able.

## 2020-05-25 ENCOUNTER — Encounter: Payer: Self-pay | Admitting: Internal Medicine

## 2020-05-25 DIAGNOSIS — N950 Postmenopausal bleeding: Secondary | ICD-10-CM

## 2020-05-25 NOTE — Telephone Encounter (Signed)
Called the patient and scheduled a new patient appt for Friday 1/21 at 3 pm. Patient to arrive at 2:30 pm. Patient given the address and phone number for the clinic. Patient also given the policy for mask and visitors

## 2020-05-26 NOTE — Telephone Encounter (Signed)
Patient called and moved her new patient appt from Friday 1/19  to Monday 1/24 due to possible weather

## 2020-05-28 ENCOUNTER — Other Ambulatory Visit (INDEPENDENT_AMBULATORY_CARE_PROVIDER_SITE_OTHER): Payer: 59

## 2020-05-28 DIAGNOSIS — R079 Chest pain, unspecified: Secondary | ICD-10-CM | POA: Diagnosis not present

## 2020-05-28 DIAGNOSIS — R1031 Right lower quadrant pain: Secondary | ICD-10-CM

## 2020-05-28 DIAGNOSIS — N950 Postmenopausal bleeding: Secondary | ICD-10-CM | POA: Diagnosis not present

## 2020-05-28 LAB — BASIC METABOLIC PANEL
BUN: 9 mg/dL (ref 6–23)
CO2: 25 mEq/L (ref 19–32)
Calcium: 9.1 mg/dL (ref 8.4–10.5)
Chloride: 102 mEq/L (ref 96–112)
Creatinine, Ser: 0.93 mg/dL (ref 0.40–1.20)
GFR: 66.49 mL/min (ref >60.00)
Glucose, Bld: 132 mg/dL — ABNORMAL HIGH (ref 70–99)
Potassium: 3.6 mEq/L (ref 3.5–5.1)
Sodium: 136 mEq/L (ref 135–145)

## 2020-05-28 LAB — CBC
HCT: 45.4 % (ref 36.0–46.0)
Hemoglobin: 15 g/dL (ref 12.0–15.0)
MCHC: 33 g/dL (ref 30.0–36.0)
MCV: 93.9 fl (ref 78.0–100.0)
Platelets: 258 10*3/uL (ref 150.0–400.0)
RBC: 4.84 Mil/uL (ref 3.87–5.11)
RDW: 14.6 % (ref 11.5–15.5)
WBC: 9.8 10*3/uL (ref 4.0–10.5)

## 2020-05-28 LAB — FERRITIN: Ferritin: 28.7 ng/mL (ref 10.0–291.0)

## 2020-05-28 LAB — URINALYSIS, ROUTINE W REFLEX MICROSCOPIC
Bilirubin Urine: NEGATIVE
Ketones, ur: NEGATIVE
Nitrite: NEGATIVE
Specific Gravity, Urine: <=1.005 — AB (ref 1.000–1.030)
Total Protein, Urine: NEGATIVE
Urine Glucose: NEGATIVE
Urobilinogen, UA: 0.2 (ref 0.0–1.0)
pH: 6 (ref 5.0–8.0)

## 2020-05-29 ENCOUNTER — Encounter: Payer: Self-pay | Admitting: Gynecologic Oncology

## 2020-05-29 ENCOUNTER — Ambulatory Visit: Payer: 59 | Admitting: Gynecologic Oncology

## 2020-05-29 ENCOUNTER — Encounter: Payer: Self-pay | Admitting: Internal Medicine

## 2020-05-29 MED ORDER — NITROFURANTOIN MONOHYD MACRO 100 MG PO CAPS: 100.0000 mg | ORAL_CAPSULE | Freq: Two times a day (BID) | ORAL | 0 refills | Status: DC

## 2020-06-01 ENCOUNTER — Encounter: Payer: Self-pay | Admitting: Internal Medicine

## 2020-06-01 ENCOUNTER — Inpatient Hospital Stay: Payer: 59 | Attending: Gynecologic Oncology | Admitting: Gynecologic Oncology

## 2020-06-01 ENCOUNTER — Other Ambulatory Visit: Payer: Self-pay

## 2020-06-01 ENCOUNTER — Encounter: Payer: Self-pay | Admitting: Gynecologic Oncology

## 2020-06-01 VITALS — BP 122/90 | HR 58 | Temp 96.6°F | Resp 20 | Ht 65.0 in | Wt 328.0 lb

## 2020-06-01 DIAGNOSIS — K5190 Ulcerative colitis, unspecified, without complications: Secondary | ICD-10-CM | POA: Insufficient documentation

## 2020-06-01 DIAGNOSIS — E538 Deficiency of other specified B group vitamins: Secondary | ICD-10-CM | POA: Diagnosis not present

## 2020-06-01 DIAGNOSIS — R0602 Shortness of breath: Secondary | ICD-10-CM | POA: Insufficient documentation

## 2020-06-01 DIAGNOSIS — E559 Vitamin D deficiency, unspecified: Secondary | ICD-10-CM | POA: Diagnosis not present

## 2020-06-01 DIAGNOSIS — Z7901 Long term (current) use of anticoagulants: Secondary | ICD-10-CM | POA: Insufficient documentation

## 2020-06-01 DIAGNOSIS — Z780 Asymptomatic menopausal state: Secondary | ICD-10-CM | POA: Insufficient documentation

## 2020-06-01 DIAGNOSIS — Z6841 Body Mass Index (BMI) 40.0 and over, adult: Secondary | ICD-10-CM | POA: Insufficient documentation

## 2020-06-01 DIAGNOSIS — N939 Abnormal uterine and vaginal bleeding, unspecified: Secondary | ICD-10-CM | POA: Insufficient documentation

## 2020-06-01 DIAGNOSIS — M1990 Unspecified osteoarthritis, unspecified site: Secondary | ICD-10-CM | POA: Insufficient documentation

## 2020-06-01 DIAGNOSIS — Z79899 Other long term (current) drug therapy: Secondary | ICD-10-CM | POA: Insufficient documentation

## 2020-06-01 DIAGNOSIS — N8501 Benign endometrial hyperplasia: Secondary | ICD-10-CM | POA: Diagnosis present

## 2020-06-01 DIAGNOSIS — Z86711 Personal history of pulmonary embolism: Secondary | ICD-10-CM | POA: Insufficient documentation

## 2020-06-01 DIAGNOSIS — I499 Cardiac arrhythmia, unspecified: Secondary | ICD-10-CM | POA: Insufficient documentation

## 2020-06-01 DIAGNOSIS — M5136 Other intervertebral disc degeneration, lumbar region: Secondary | ICD-10-CM | POA: Diagnosis not present

## 2020-06-01 NOTE — Progress Notes (Signed)
GYNECOLOGIC ONCOLOGY NEW PATIENT CONSULTATION   Patient Name: Cassie [name]  Patient Age: 60 y.o. Date of Service: 06/01/20 Referring Provider: Dr. Quincy Simmonds  Primary Care Provider: Hoyt Koch, MD Consulting Provider: Jeral Pinch, MD   Assessment/Plan:  Postmenopausal patient with history of simple hyperplasia without atypia with continued vaginal bleeding and cramping despite oral progestin therapy.  I spent a significant amount of time reviewing the diagnosis of simple endometrial hyperplasia with the patient and her husband, who accompanied her to the visit today.  We discussed the etiology of hyperplasia and the natural history if untreated in some women.  In terms of cancer risk, her overall risk of progression to endometrial cancer is low.  Our data, while limited, would suggest that her risk is at most 10%; however, the patient has multiple risk factors for development of uterine cancer, which have placed her at risk of hyperplasia.  Most notably, this includes her obesity, with a BMI of 55.  We reviewed treatment for simple hyperplasia which in a postmenopausal woman can include either progestin therapy or simple hysterectomy.  The patient is not an ideal surgical candidate given her weight, pulmonary status, and prior pulmonary embolism.  She describes to me today that she becomes very short of breath when ambulating even a short distance.  This has been worked up by her cardiologist and to date there is no known etiology.  I have significant concerns that she would not tolerate Trendelenburg position to undergo robotic hysterectomy.  Given her multiple comorbid conditions that increase her risk of surgery, I recommend further exhausting progestin options.  Oral Aygestin, at a decreased dose due to elevation of her liver enzymes, does not seem to be helping her bleeding or preventing buildup of her endometrial lining.  I talked with the patient about a Mirena intrauterine device  and showed her a model of the IUD and how it is placed.  We discussed the side effects that can be seen after IUD placement.  The patient is amenable to proceeding with IUD placement for treatment of her ongoing vaginal bleeding.  Given her thickened lining on ultrasound, I discussed my recommendation that when she has her Mirena IUD placed, I think she would benefit from having this done in the operating room and undergoing repeat D&C.  This would both give [place] additional tissue for examination (since she has not had sampling since August) and would hopefully decrease the amount of bleeding and cramping that she has immediately after the IUD placement.  Additionally, a tilt test could be performed at the time of her surgery to see the likelihood of her tolerating Trendelenburg.  There could also be assessment of her uterine descensus to see if she would be a candidate for vaginal hysterectomy.  In regards to her estradiol and [place], I do not know that clinically continuing to check these labs will be of much benefit.  My suspicion is that her estradiol is being driven by conversion of other androgens to estrogen in her adipose tissue.  There is a chance that her estrogen is being produced somewhere else (like her adrenal glands) and hopefully the CT scan that her primary care provider ordered for abdominal pain that she is having done later this week would show any other potential abnormality that could be the source of estrogen production.  Her [place], while initially high and in the postmenopausal level, has now decreased and is much lower than I would expect in menopause.  My guess is  that this is related to negative feedback from her high estrogen level.  The patient notes having not had any significant weight gain recently.  The weight that we documented today, which was the weight given to [place] by the patient because of her difficulty getting out of the wheelchair to stand on the scale, is very similar to what  was recorded approximately 7 months ago in her [place] office.  We also spent some time discussing how she could work on increasing physical activity and decreasing caloric intake to achieve weight loss.  She is quite deconditioned which is further worsened by her shortness of breath.  We discussed even small increments in walking and moving that will help increase her metabolic activity.  In terms of her diet, she is restricted because of foods that she has to avoid for ulcerative colitis.  She has not seen her GI in a number of years, and I recommended strongly that she called to get a visit scheduled as well as a repeat colonoscopy for which she is overdue.  I think they can probably offer some guidance in terms of dietary changes that will not cause symptoms or flare of her ulcerative colitis.  We also talked about the use of portion control to help decrease caloric intake.  Patient was given my office number and knows that she can reach out to me for any questions or concerns in the future.  If she were to fail from a symptom standpoint having a Mirena IUD placed, I think it would be reasonable to then add back some oral progesterone in addition.    A copy of this note was sent to the patient's referring provider.   90 minutes of total time was spent for this patient encounter, including preparation, face-to-face counseling with the patient and coordination of care, and documentation of the encounter.  Jeral Pinch, MD  Division of Gynecologic Oncology  Department of Obstetrics and Gynecology  [facility]  ___________________________________________  Chief Complaint: Chief Complaint  Patient presents with  . Simple endometrial hyperplasia without atypia    History of Present Illness:  Cassie [name] is a 60 y.o. y.o. female who is seen in consultation at the request of Dr. Quincy Simmonds for an evaluation of simple hyperplasia, now regressed on multiple biopsies, but  with continued cramping and vaginal bleeding.  Patient endorses being on Loestrin for years until her late forties. At that point she had some lab testing and was told she was menopausal (outside records note that she had an [facility] of 50 in 2012 and transitioned to HRT in 2013). She was then started on Femhrt which she was on until 2018. She was diagnosed with a pulmonary embolism and subsequently taken off her HRT. In November of 2020, the patient presented to the emergency department with postmenopausal bleeding. She was seen at [place] shortly after she had an ultrasound and underwent endometrial biopsy in early December 2020 which showed simple endometrial hyperplasia. She was offered oral progesterone therapy or Mirena IUD. She saw Dr. Sabra Heck for follow-up in 2021. She had no bleeding or cramping after her biopsy in December. She underwent a repeat biopsy in June and afterward developed cramping, vaginal discharge and bleeding.  In August she underwent a D&C and hysteroscopy. She had continued bleeding and cramping after the procedure and some days cramping up to 12 hours. More recently, she has been treated for bacterial vaginosis. Her bleeding stopped from October until about a month ago.  She has been on Aygestin for simple hyperplasia and bleeding although had to have the dose lowered to 5 mg due to elevation of her liver enzymes. Pelvic ultrasound on 1/13 at [place]. Uterus 8.1x3.7x4.8cm. Endometrial lining 9.68m. No adnexal masses or free fluid.  She endorses a good diet without nausea or emesis. She has a history of ulcerative colitis with a flare towards the end of last year. Flares for her include eating less, abdominal bloating, bleeding, passage of mucus, and pain. She now notes having normal bowel function although has diarrhea intermittently.  In addition to her history of pulmonary embolism, the patient has significant shortness of breath that she has had for 2 to 3  years without an known etiology. She has trouble walking more than 20-30 steps without getting short of breath.  She was due for repeat colonoscopy two or 3 years ago.  PAST MEDICAL HISTORY:  Past Medical History:  Diagnosis Date  . Adrenal nodule ([place]   . Allergy   . Anemia    in her early twenties  . Anxiety   . Arthritis    knees,thumbs  . Cardiac arrhythmia    palpitations,PVC'sAC  . Cataract    bilateral removed   . Cyst of kidney, acquired    cyst right kidney- benign  . Degenerative disc disease, lumbar   . Dyspnea   . Edema   . Gallstones   . History of anemia    20 yrs. ago not now- pt denies   . Morbid obesity ([place]   . Osteoarthritis   . Other allergy, other than to medicinal agents   . Panic disorder   . Pulmonary embolism ([place]   . Run of atrial premature complexes   . Ulcerative colitis, unspecified   . Vitamin B12 deficiency   . Vitamin D deficiency      PAST SURGICAL HISTORY:  Past Surgical History:  Procedure Laterality Date  . BREAST BIOPSY  11/2011   Left- benign  . CATARACT EXTRACTION Right   . CATARACT EXTRACTION Left   . CHOLECYSTECTOMY N/A 01/17/2014   Procedure: LAPAROSCOPIC CHOLECYSTECTOMY;  Surgeon: BExcell Seltzer MD;  Location: [facility];  Service: General;  Laterality: N/A;  . COLONOSCOPY    . DILATATION & CURETTAGE/HYSTEROSCOPY WITH MYOSURE N/A 12/23/2019   Procedure: DILATATION & CURETTAGE, HYSTEROSCOPY WITH MYOSURE, RESECTION OF POLYPOID TISSUE;  Surgeon: MMegan Salon MD;  Location: [facility]  Service: Gynecology;  Laterality: N/A;  . RETINAL LASER PROCEDURE Left   . SIGMOIDOSCOPY      OB/GYN HISTORY:  OB History  Gravida Para Term Preterm AB Living  0 0 0 0 0 0  SAB IAB Ectopic Multiple Live Births  0 0 0 0 0    Patient's last menstrual period was 05/09/2010 (approximate).  Age at menarche: 11-12 Age at menopause: unsure Hx of HRT: on Aygestin Hx of STDs: denies Last pap: 06/2019 negative, HR HPV negative History of  abnormal pap smears: yes, remote history  SCREENING STUDIES:  Last mammogram: 06/2018  Last colonoscopy: 11/2015 Last bone mineral density: 12/2019  MEDICATIONS: Outpatient Encounter Medications as of 06/01/2020  Medication Sig  . acetaminophen (TYLENOL) 500 MG tablet Take 1,000 mg by mouth every 6 (six) hours as needed for moderate pain or headache.  . ALPRAZolam (XANAX) 1 MG tablet TAKE 1/2 TO 1 TABLET BY MOUTH 2 TIMES DAILY AS NEEDED FOR ANXIETY. (Patient taking differently: Take 0.5 mg in am and 1 mg at hs)  . atenolol (  TENORMIN) 25 MG tablet Take 0.5 tablets (12.5 mg total) by mouth 2 (two) times daily.  Marland Kitchen ELIQUIS 5 MG TABS tablet TAKE 1 TABLET BY MOUTH TWICE DAILY  . norethindrone (AYGESTIN) 5 MG tablet TAKE 2 TABLETS(10 MG) BY MOUTH DAILY (Patient taking differently: Take 5 mg by mouth daily with supper.)  . Probiotic Product (PROBIOTIC-10 PO) Take 2 tablets by mouth daily.  . Simethicone (GAS-X PO) Take 2 tablets by mouth 4 (four) times daily as needed (For gas.). Do not exceed 6 tablets in a 24 hour period.  Marland Kitchen loperamide (IMODIUM A-D) 2 MG tablet Take 2 mg by mouth 4 (four) times daily as needed for diarrhea or loose stools. (Patient not taking: Reported on 05/29/2020)  . Polyethylene Glycol 400 (VISINE DRY EYE RELIEF OP) Place 1 drop into both eyes daily as needed (dry eye). (Patient not taking: Reported on 05/29/2020)   No facility-administered encounter medications on file as of 06/01/2020.    ALLERGIES:  Allergies  Allergen Reactions  . Doxycycline Palpitations  . Hydrocodone Other (See Comments)    Hallucinations   . Sudafed [Pseudoephedrine Hcl] Shortness Of Breath  . Acular [Ketorolac Tromethamine]     Unknown reaction   . Codeine Other (See Comments)  . Diphenhydramine Hcl Swelling    Throat feels like it swells   . Lactose Intolerance (Gi) Diarrhea and Other (See Comments)    bloating  . Mesalamine Other (See Comments)    Hair loss  . Sulfasalazine Diarrhea  .  Azithromycin Palpitations    Speeding heart rate per pt.   . Caffeine Palpitations  . Penicillins Rash    Has patient had a PCN reaction causing immediate rash, facial/tongue/throat swelling, SOB or lightheadedness with hypotension: Yes Has patient had a PCN reaction causing severe rash involving mucus membranes or skin necrosis: No Has patient had a PCN reaction that required hospitalization Yes Has patient had a PCN reaction occurring within the last 10 years: No If all of the above answers are "NO", then may proceed with Cephalosporin use.  . Prednisone Palpitations     FAMILY HISTORY:  Family History  Problem Relation Age of Onset  . Heart disease Father 83       pacemaker  . Atrial fibrillation Father   . Prostate cancer Father   . Hypertension Maternal Aunt   . Endometrial cancer Maternal Aunt   . Hypertension Maternal Grandfather   . Colon cancer Neg Hx   . Stomach cancer Neg Hx   . Colon polyps Neg Hx   . Esophageal cancer Neg Hx   . Rectal cancer Neg Hx   . Ovarian cancer Neg Hx   . Pancreatic cancer Neg Hx   . Breast cancer Neg Hx      SOCIAL HISTORY:  Social Connections: Not on file    REVIEW OF SYSTEMS:  + Pertinent positives as per HPI Denies appetite changes, fevers, chills, fatigue, unexplained weight changes. Denies hearing loss, neck lumps or masses, mouth sores, ringing in ears or voice changes. Denies cough or wheezing.  Denies shortness of breath. Denies chest pain or palpitations. Denies leg swelling. Denies abdominal distention, pain, blood in stools, constipation, nausea, vomiting, or early satiety. Denies pain with intercourse, dysuria, frequency, hematuria or incontinence. Denies hot flashes.   Denies joint pain, back pain or muscle pain/cramps. Denies itching, rash, or wounds. Denies dizziness, headaches, numbness or seizures. Denies swollen lymph nodes or glands, denies easy bruising or bleeding. Denies anxiety, depression, confusion, or  decreased  concentration.  Physical Exam:  Vital Signs for this encounter:  Blood pressure 122/90, pulse (!) 58, temperature (!) 96.6 F (35.9 C), temperature source Tympanic, resp. rate 20, height 5' 5"  (1.651 m), weight (!) 328 lb (148.8 kg), last menstrual period 05/09/2010, SpO2 100 %. Body mass index is 54.58 kg/m. General: Alert, oriented, no acute distress.  HEENT: Normocephalic, atraumatic. Sclera anicteric.  Chest: Unlabored breathing on room air.  LABORATORY AND RADIOLOGIC DATA:  Outside medical records were reviewed to synthesize the above history, along with the history and physical obtained during the visit.   Lab Results  Component Value Date   WBC 9.8 05/28/2020   HGB 15.0 05/28/2020   HCT 45.4 05/28/2020   PLT 258.0 05/28/2020   GLUCOSE 132 (H) 05/28/2020   CHOL 162 01/31/2020   TRIG 66 01/31/2020   HDL 29 (L) 01/31/2020   LDLCALC 117 (H) 01/31/2020   ALT 17 01/31/2020   AST 14 01/31/2020   NA 136 05/28/2020   K 3.6 05/28/2020   CL 102 05/28/2020   CREATININE 0.93 05/28/2020   BUN 9 05/28/2020   CO2 25 05/28/2020   TSH 3.91 07/30/2019   INR 1.2 12/23/2019   HGBA1C 5.7 06/14/2018   Ca-125 in 02/2020: 9.5  D&C from 12/2019: ENDOMETRIUM, CURETTAGE:  - Benign squamous epithelium  - Microglandular adenosis, inflamed, with squamous metaplasia  - Benign lower uterine segment  - No malignancy identified   D&C from 10/2019: ENDOMETRIUM, BIOPSY:  - Benign endometrium with progestin effect  - No malignancy identified   Labs on 03/06/20: Estradiol 47.6. FSH 8.3 .

## 2020-06-01 NOTE — Patient Instructions (Addendum)
It was a pleasure meeting you today. Given continued bleeding, I would recommend a procedure with your gynecologist do resample the lining of your uterus. I think you would benefit from a d&c, Mirena IUD (to treat the thickening of your lining) and a tilt test (to see if you would tolerate the position needed for robotic surgery). I will pass these recommendations on to Dr. Quincy Simmonds.    Endometrial Hyperplasia  Endometrial hyperplasia thickens the uterus lining, causing heavy or abnormal bleeding. Atypical endometrial hyperplasia raises the risk of endometrial cancer and uterine cancer. The condition tends to occur during or after menopause. Progestin therapy can ease symptoms. Women at risk for cancer may choose to get a hysterectomy.  What is endometrial hyperplasia?  Endometrial hyperplasia is a condition of the female reproductive system. The lining of the uterus (endometrium) becomes unusually thick because of having too many cells (hyperplasia). It's not cancer, but in certain women, it raises the risk of developing endometrial cancer, a type of uterine cancer.  How common is endometrial hyperplasia?  Endometrial hyperplasia is rare. It affects approximately 133 out of 100,000 women.  Who might have endometrial hyperplasia?  Women who are perimenopausal or menopausal are more likely to have endometrial hyperplasia. It rarely occurs in women younger than 69. Other risk factors include:  Certain breast cancer treatments (tamoxifen). Diabetes. Early age for menstruation or late onset of menopause. Family history of ovarian, uterine or colon cancer. Gallbladder disease. Hormone therapy. Never being pregnant. Obesity. Polycystic ovary syndrome (PCOS). Smoking. Thyroid disease. White race. Long history of irregular or absent menstruation.  What are the types of endometrial hyperplasia? Doctors classify endometrial hyperplasia based on the kinds of cell changes in the endometrial  lining. Types of endometrial hyperplasia include:  Simple endometrial hyperplasia (without atypia): This type of endometrial hyperplasia has normal-looking cells that aren't likely to become cancerous. This condition may improve without treatment. Hormone therapy helps in some cases. Simple or complex atypical endometrial hyperplasia: An overgrowth of abnormal cells causes this precancerous condition. Without treatment, your risk of endometrial or uterine cancer increases.  What causes endometrial hyperplasia?  Women who develop endometrial hyperplasia produce too much estrogen and not enough progesterone. These female hormones play essential roles in menstruation and pregnancy. During ovulation, estrogen thickens the endometrium, while progesterone prepares the uterus for pregnancy. If conception doesn't occur, progesterone levels drop. The progesterone drop triggers the uterus to shed its lining as a menstrual period.  Women who have endometrial hyperplasia make little, if any, progesterone. As a result, the uterus doesn't shed the endometrial lining. Instead, the lining continues to grow and thicken.  Additionally, obesity contributes to the elevation of estrogen levels. The adipose tissue (fat stores in the abdomen and body) can convert the fat producing hormones to estrogen. This is the how obesity contributes to elevated circulating levels of estrogen and increases the risk of endometrial hyperplasia.  What are the symptoms of endometrial hyperplasia?  Women with endometrial hyperplasia may experience:  Abnormal menstruation, such as short menstrual cycles, unusually long periods or missed periods. Heavy menstrual bleeding). Bleeding after menopause (when periods stop).  How is endometrial hyperplasia diagnosed?  Many conditions can cause abnormal bleeding. To identify what's causing symptoms, your healthcare provider may order one or more of these tests: Ultrasound: A transvaginal  ultrasound uses sound waves to produce images of the uterus. The images can show if the lining is thick. Biopsy: An endometrial biopsy removes tissue samples from the uterus lining. Pathologists study  the cells to confirm or rule out cancer. Hysteroscopy: Your provider uses a thin, lighted tool called a hysteroscope to examine the cervix and look inside the uterus. Your provider may perform this procedure along with a dilation and curettage (D&C) or biopsy. It's most advantageous to couple this with a visually directed dilation and curettage of the endometrium. With hysteroscopy, your provider can see abnormalities within the endometrial cavity and take a targeted (directed) biopsy of any suspicious areas.  What are the complications of endometrial hyperplasia?  All types of hyperplasia can cause abnormal and heavy bleeding that can make you anemic. Anemia develops when your body doesn't have enough iron-rich red blood cells.  Untreated atypical endometrial hyperplasia can become cancerous. Endometrial or uterine cancer develops in about 8% of women with untreated simple atypical endometrial hyperplasia. Close to 30% of women with complex atypical endometrial hyperplasia who don't get treatment develop cancer.  How is endometrial hyperplasia managed or treated?  If you're at increased risk of cancer due to atypical endometrial hyperplasia, your healthcare provider may recommend a hysterectomy to remove the uterus. After a hysterectomy, you won't be able to get pregnant. Many people see symptoms improve with less invasive progestin treatments. Progestin comes in many forms:  Oral progesterone therapy (megace, norethindrone, medroxyprogesterone).Progesterone hormonal intrauterine device (IUD). Injection (Depo-Provera).  How can I prevent endometrial hyperplasia? Certain steps may reduce your chances of developing endometrial hyperplasia: Use progesterone along with estrogen after menopause (if you  use hormone therapy). Take the birth control pill. Quit smoking. Maintain a healthy weight.  What is the prognosis (outlook) for people who have endometrial hyperplasia? Endometrial hyperplasia responds well to progestin treatments. Atypical endometrial hyperplasia can lead to endometrial or uterine cancer. Your healthcare provider may recommend more frequent direct (hysteroscopic) assessment or a hysterectomy to eliminate cancer risk.  LIVING WITH When should I call the doctor? You should call your healthcare provider if you experience:  Heavy or abnormal bleeding. Vaginal bleeding after menopause. Painful cramping (dysmenorrhea). Painful urination (dysuria). Painful intercourse (dyspareunia). Pelvic pain. Unusual vaginal discharge. Frequently missed menstrual periods. What questions should I ask my doctor? If you have endometrial hyperplasia, you may want to ask your healthcare provider:  Why did I get endometrial hyperplasia? What type of endometrial hyperplasia do I have? Am I at increased risk for endometrial or uterine cancer? If so, how can I lower that risk? If I'm overweight, can I be referred to a weight management consultant? What is the best treatment for the type of endometrial hyperplasia I have? What are the treatment risks and side effects? Are my family members at risk for developing endometrial hyperplasia? If so, what can they do to lower that risk? What type of follow-up care do I need after treatment? Should I look out for signs of complications?

## 2020-06-02 ENCOUNTER — Encounter: Payer: Self-pay | Admitting: Gynecologic Oncology

## 2020-06-03 ENCOUNTER — Telehealth: Payer: Self-pay | Admitting: Obstetrics and Gynecology

## 2020-06-03 ENCOUNTER — Ambulatory Visit (HOSPITAL_BASED_OUTPATIENT_CLINIC_OR_DEPARTMENT_OTHER): Payer: 59

## 2020-06-03 NOTE — Telephone Encounter (Signed)
Spoke with patient and informed her. Staff message sent to Alinda Sierras to contact patient to schedule consult visit.

## 2020-06-03 NOTE — Telephone Encounter (Signed)
Please contact patient in follow up to her visit with GYN ONCOLOGY.   I would like her to schedule an appointment with me to discuss hysteroscopy with dilation and curettage and intraop placement of Mirena IUD.

## 2020-06-04 ENCOUNTER — Ambulatory Visit (HOSPITAL_BASED_OUTPATIENT_CLINIC_OR_DEPARTMENT_OTHER): Admission: RE | Admit: 2020-06-04 | Payer: 59 | Source: Ambulatory Visit

## 2020-06-04 ENCOUNTER — Other Ambulatory Visit: Payer: 59

## 2020-06-06 ENCOUNTER — Encounter: Payer: Self-pay | Admitting: Obstetrics and Gynecology

## 2020-06-08 ENCOUNTER — Encounter: Payer: Self-pay | Admitting: Obstetrics and Gynecology

## 2020-06-08 NOTE — Telephone Encounter (Signed)
Per review of Epic, patient is scheduled for a consult on 06/09/20 at 4pm.   Encounter closed.

## 2020-06-09 ENCOUNTER — Other Ambulatory Visit: Payer: Self-pay

## 2020-06-09 ENCOUNTER — Encounter: Payer: Self-pay | Admitting: Obstetrics and Gynecology

## 2020-06-09 ENCOUNTER — Ambulatory Visit: Payer: 59 | Admitting: Obstetrics and Gynecology

## 2020-06-09 VITALS — BP 140/76 | HR 73

## 2020-06-09 DIAGNOSIS — N950 Postmenopausal bleeding: Secondary | ICD-10-CM | POA: Diagnosis not present

## 2020-06-09 DIAGNOSIS — Z8742 Personal history of other diseases of the female genital tract: Secondary | ICD-10-CM | POA: Diagnosis not present

## 2020-06-09 NOTE — Progress Notes (Signed)
GYNECOLOGY  VISIT   HPI: 60 y.o.   Married  Caucasian  female   G0P0000 with Patient's last menstrual period was 05/09/2010 (approximate).   here for consult.    Husband is present for the visit today.  Patient is followed for history of simple endometrial hyperplasia resolved by follow up sampling done at time of hysteroscopy last year.  She continues on Aygestin daily and is experiencing episodes of increased bleeding.   Increased bleeding started Friday night.  Pad change every 3 hours on Saturday.  Vaginal bleeding has lessened.  No missed Aygestin.   She has had consultation with [facility] due to her ongoing bleeding, difficulty with pelvic exams, and chronic medical conditions.  She has been desirous for hysterectomy just to make the problem go away.  She does have concerns about potential Covid exposure and would like to delay surgical care.  Hysteroscopy with dilation and curettage and placement of Mirena IUD has been recommended.   Patient does take Eliquis for history of PE.  Has been vaccinated and boosted against Covid.   Having CT scan tomorrow.   Sees Cardiology in beginning of April, hematology March, mammogram in March.   GYNECOLOGIC HISTORY: Patient's last menstrual period was 05/09/2010 (approximate). Contraception:   Menopausal hormone therapy:  Aygestin Last mammogram: 07/2019 normal per patient:[facility] Last pap smear:  06-20-19 Neg:Neg HR HPV        OB History    Gravida  0   Para  0   Term  0   Preterm  0   AB  0   Living  0     SAB  0   IAB  0   Ectopic  0   Multiple  0   Live Births  0              Patient Active Problem List   Diagnosis Date Noted  . Leg swelling 02/27/2020  . Hyperlipidemia 02/27/2020  . Head pain 10/25/2019  . Elevated LFTs 10/25/2019  . Aortic atherosclerosis ([place]) 08/22/2019  . NSVT (nonsustained ventricular tachycardia) ([place]) 07/10/2019  . Right lower quadrant abdominal pain 07/08/2019  . Simple  endometrial hyperplasia 06/23/2019  . Anticoagulated 06/23/2019  . Postmenopausal bleeding 04/03/2019  . Cough variant asthma vs UACS from gerd 01/01/2018  . PAC (premature atrial contraction) 08/17/2017  . PVC (premature ventricular contraction) 08/17/2017  . PSVT (paroxysmal supraventricular tachycardia) ([place]) 08/17/2017  . Leg edema 05/19/2017  . Other fatigue 12/10/2016  . Myocardial bridge 12/10/2016  . Coronary artery calcification 12/10/2016  . History of pulmonary embolism 12/10/2016  . GERD (gastroesophageal reflux disease) 07/12/2016  . Shortness of breath 05/31/2016  . Chronic venous insufficiency 05/31/2016  . UC (ulcerative colitis) ([place]) 01/28/2015  . Routine general medical examination at a health care facility 06/27/2014  . Left medial knee pain 07/17/2013  . Adrenal incidentaloma ([place]) 03/25/2013  . Allergic rhinitis 09/13/2011  . B12 deficiency 11/28/2008  . Vitamin D deficiency 06/30/2008  . Palpitations 06/30/2008  . Morbid obesity ([place]) 10/15/2007  . Anxiety about health 08/23/2007  . Cardiac dysrhythmia 08/23/2007    Past Medical History:  Diagnosis Date  . Adrenal nodule ([place])   . Allergy   . Anemia    in her early twenties  . Anxiety   . Arthritis    knees,thumbs  . Cardiac arrhythmia    palpitations,PVC'sAC  . Cataract    bilateral removed   . Cyst of kidney, acquired    cyst right kidney-  benign  . Degenerative disc disease, lumbar   . Dyspnea   . Edema   . Gallstones   . History of anemia    20 yrs. ago not now- pt denies   . Morbid obesity ([place])   . Osteoarthritis   . Other allergy, other than to medicinal agents   . Panic disorder   . Pulmonary embolism ([place])   . Run of atrial premature complexes   . Ulcerative colitis, unspecified   . Vitamin B12 deficiency   . Vitamin D deficiency     Past Surgical History:  Procedure Laterality Date  . BREAST BIOPSY  11/2011   Left- benign  . CATARACT EXTRACTION Right   . CATARACT  EXTRACTION Left   . CHOLECYSTECTOMY N/A 01/17/2014   Procedure: LAPAROSCOPIC CHOLECYSTECTOMY;  Surgeon: Excell Seltzer, MD;  Location: [facility];  Service: General;  Laterality: N/A;  . COLONOSCOPY    . DILATATION & CURETTAGE/HYSTEROSCOPY WITH MYOSURE N/A 12/23/2019   Procedure: DILATATION & CURETTAGE, HYSTEROSCOPY WITH MYOSURE, RESECTION OF POLYPOID TISSUE;  Surgeon: Megan Salon, MD;  Location: [facility];  Service: Gynecology;  Laterality: N/A;  . RETINAL LASER PROCEDURE Left   . SIGMOIDOSCOPY      Current Outpatient Medications  Medication Sig Dispense Refill  . acetaminophen (TYLENOL) 500 MG tablet Take 1,000 mg by mouth every 6 (six) hours as needed for moderate pain or headache.    . ALPRAZolam (XANAX) 1 MG tablet TAKE 1/2 TO 1 TABLET BY MOUTH 2 TIMES DAILY AS NEEDED FOR ANXIETY. (Patient taking differently: Take 0.5 mg in am and 1 mg at hs) 60 tablet 2  . atenolol (TENORMIN) 25 MG tablet Take 0.5 tablets (12.5 mg total) by mouth 2 (two) times daily. 90 tablet 0  . ELIQUIS 5 MG TABS tablet TAKE 1 TABLET BY MOUTH TWICE DAILY 60 tablet 6  . loperamide (IMODIUM A-D) 2 MG tablet Take 2 mg by mouth 4 (four) times daily as needed for diarrhea or loose stools.    . norethindrone (AYGESTIN) 5 MG tablet TAKE 2 TABLETS(10 MG) BY MOUTH DAILY (Patient taking differently: Take 5 mg by mouth daily with supper.) 60 tablet 5  . Polyethylene Glycol 400 (VISINE DRY EYE RELIEF OP) Place 1 drop into both eyes daily as needed (dry eye).    . Probiotic Product (PROBIOTIC-10 PO) Take 2 tablets by mouth daily.    . Simethicone (GAS-X PO) Take 2 tablets by mouth 4 (four) times daily as needed (For gas.). Do not exceed 6 tablets in a 24 hour period.     No current facility-administered medications for this visit.     ALLERGIES: Doxycycline, Hydrocodone, Sudafed [pseudoephedrine hcl], Acular [ketorolac tromethamine], Codeine, Diphenhydramine hcl, Lactose intolerance (gi), Mesalamine, Sulfasalazine, Azithromycin,  Caffeine, Penicillins, and Prednisone  Family History  Problem Relation Age of Onset  . Heart disease Father 3       pacemaker  . Atrial fibrillation Father   . Prostate cancer Father   . Hypertension Maternal Aunt   . Endometrial cancer Maternal Aunt   . Hypertension Maternal Grandfather   . Colon cancer Neg Hx   . Stomach cancer Neg Hx   . Colon polyps Neg Hx   . Esophageal cancer Neg Hx   . Rectal cancer Neg Hx   . Ovarian cancer Neg Hx   . Pancreatic cancer Neg Hx   . Breast cancer Neg Hx     Social History   Socioeconomic History  . Marital status: Married  Spouse name: Not on file  . Number of children: 0  . Years of education: Not on file  . Highest education level: Not on file  Occupational History  . Occupation: Arboriculturist: UNEMPLOYED  Tobacco Use  . Smoking status: Former Smoker    Packs/day: 2.00    Years: 12.00    Pack years: 24.00    Types: Cigarettes    Quit date: 03/07/1979    Years since quitting: 41.2  . Smokeless tobacco: Never Used  Vaping Use  . Vaping Use: Never used  Substance and Sexual Activity  . Alcohol use: No    Alcohol/week: 0.0 standard drinks  . Drug use: No  . Sexual activity: Not Currently    Partners: Male    Birth control/protection: Post-menopausal  Other Topics Concern  . Not on file  Social History Narrative   Married: 0 kids   Regular exercise: walks 2 times a week   Caffeine use: none   Social Determinants of Radio broadcast assistant Strain: Not on file  Food Insecurity: Not on file  Transportation Needs: Not on file  Physical Activity: Not on file  Stress: Not on file  Social Connections: Not on file  Intimate Partner Violence: Not on file    Review of Systems  All other systems reviewed and are negative.   PHYSICAL EXAMINATION:    BP 140/76 (Cuff Size: Large)   Pulse 73   LMP 05/09/2010 (Approximate)   SpO2 97%     General appearance: alert, cooperative and appears stated  age Patient uses wheelchair. Lungs:  CTA bilaterally.  Cor:  S1S2 RRR.   ASSESSMENT  Postmenopausal bleeding. History of simple endometrial hyperplasia.  On Aygestin daily.  Elevated liver function on bid Aygestin.  Hx PE.   On Eliquis. Difficulty with exams due to leg cramps.  PLAN  We had a comprehensive discussion regarding hysteroscopy, dilation and curettage, with Mirena IUD placement versus attempted office endometrial biopsy with follow up placement of Mirena IUD.   She may need to take Flexeril 10 mg prior to her office visit to help achieve the endometrial biopsy.    Risks of hysteroscopy and IUD placement include but are not limited to bleeding, infection, damage to surrounding organs including uterine perforation requiring hospitalization and laparoscopy, pulmonary edema, reaction to anesthesia, DVT, PE, death, need for further treatment.    Surgical care would require discontinuation of her Eliquis and a Lovenox bridge.  Would need to reach out to her prescriber to receive recommendations.   Patient will decide which route she would like to go.  I will have the office contact her in follow up.   49 min  total time was spent for this patient encounter, including preparation, face-to-face counseling with the patient, coordination of care, and documentation of the encounter.

## 2020-06-10 ENCOUNTER — Ambulatory Visit (HOSPITAL_BASED_OUTPATIENT_CLINIC_OR_DEPARTMENT_OTHER)
Admission: RE | Admit: 2020-06-10 | Discharge: 2020-06-10 | Disposition: A | Discharge status: Home / Self Care | Payer: 59 | Source: Ambulatory Visit | Attending: Internal Medicine | Admitting: Internal Medicine

## 2020-06-10 ENCOUNTER — Encounter (HOSPITAL_BASED_OUTPATIENT_CLINIC_OR_DEPARTMENT_OTHER): Payer: Self-pay

## 2020-06-10 DIAGNOSIS — R079 Chest pain, unspecified: Secondary | ICD-10-CM

## 2020-06-10 DIAGNOSIS — R1031 Right lower quadrant pain: Secondary | ICD-10-CM | POA: Diagnosis present

## 2020-06-10 MED ORDER — IOHEXOL 350 MG/ML SOLN
100.0000 mL | Freq: Once | INTRAVENOUS | Status: AC | PRN
  Administered 2020-06-10: 100 mL via INTRAVENOUS

## 2020-06-11 ENCOUNTER — Encounter: Payer: Self-pay | Admitting: Internal Medicine

## 2020-06-11 MED ORDER — ATENOLOL 50 MG PO TABS: ORAL_TABLET | ORAL | 3 refills | Status: DC

## 2020-06-12 ENCOUNTER — Encounter: Payer: Self-pay | Admitting: Internal Medicine

## 2020-06-12 ENCOUNTER — Other Ambulatory Visit: Payer: Self-pay

## 2020-06-12 ENCOUNTER — Telehealth: Payer: Self-pay | Admitting: Obstetrics and Gynecology

## 2020-06-12 ENCOUNTER — Telehealth (INDEPENDENT_AMBULATORY_CARE_PROVIDER_SITE_OTHER): Payer: 59 | Admitting: Internal Medicine

## 2020-06-12 DIAGNOSIS — I700 Atherosclerosis of aorta: Secondary | ICD-10-CM | POA: Diagnosis not present

## 2020-06-12 DIAGNOSIS — R1031 Right lower quadrant pain: Secondary | ICD-10-CM | POA: Diagnosis not present

## 2020-06-12 DIAGNOSIS — N950 Postmenopausal bleeding: Secondary | ICD-10-CM | POA: Diagnosis not present

## 2020-06-12 DIAGNOSIS — K51919 Ulcerative colitis, unspecified with unspecified complications: Secondary | ICD-10-CM

## 2020-06-12 MED ORDER — ATENOLOL 50 MG PO TABS: 25.0000 mg | ORAL_TABLET | Freq: Two times a day (BID) | ORAL | 3 refills | Status: DC

## 2020-06-12 MED ORDER — ATENOLOL 50 MG PO TABS: ORAL_TABLET | ORAL | 3 refills | Status: DC

## 2020-06-12 NOTE — Assessment & Plan Note (Signed)
Advised to call GI to get in for colonoscopy as she is overdue.

## 2020-06-12 NOTE — Assessment & Plan Note (Signed)
Discussed with patient there is no clear finding for this on the CT scan and advised her to schedule with GI as she is overdue for colonoscopy for monitoring of her UC.

## 2020-06-12 NOTE — Addendum Note (Signed)
Addended by: Pricilla Holm A on: 06/12/2020 12:03 PM   Modules accepted: Orders

## 2020-06-12 NOTE — Telephone Encounter (Signed)
Please contact patient in follow up to her visit this week to finalize a plan for her vaginal bleeding evaluation and treatment.   We discussed hysteroscopy with dilation and curettage and intra operative placement of Mirena IUD versus attempted office endometrial biopsy with later potential placement of Mirena IUD if appropriate based on biopsy results.   If she chooses office endometrial biopsy, I would recommend she sign her consent form in advance, and take Flexeril 10 mg orally an hour prior to the visit.  She has leg cramps which make doing pelvic exams difficult.  I would need the procedure room.  [place]

## 2020-06-12 NOTE — Progress Notes (Signed)
Virtual Visit via Audio Note  I connected with Cassie [name] on 06/12/20 at 11:00 AM EST by an audio-only enabled telemedicine application and verified that I am speaking with the correct person using two identifiers.  The patient and the provider were at separate locations throughout the entire encounter. Patient location: home, Provider location: work   I discussed the limitations of evaluation and management by telemedicine and the availability of in person appointments. The patient expressed understanding and agreed to proceed. The patient and the provider were the only parties present for the visit unless noted in HPI below.  History of Present Illness: The patient is a 60 y.o. female with visit for ongoing health concerns and CT scans. Questions about this and vaginal bleeding issues. Still having GI issues and colitis flare  Observations/Objective: A and O times 3, no coughing or dyspnea during visit  Assessment and Plan: See problem oriented charting  Follow Up Instructions: questions answered, advised to schedule with GI to rule out ongoing colitis flare  Visit time 35 minutes in face to face communication with patient and coordination of care  I discussed the assessment and treatment plan with the patient. The patient was provided an opportunity to ask questions and all were answered. The patient agreed with the plan and demonstrated an understanding of the instructions.   The patient was advised to call back or seek an in-person evaluation if the symptoms worsen or if the condition fails to improve as anticipated.  Hoyt Koch, MD

## 2020-06-12 NOTE — Assessment & Plan Note (Signed)
Discussed this stable finding with her.

## 2020-06-12 NOTE — Assessment & Plan Note (Signed)
Discussed options with her of biopsy and D and C and mirena and advise given.

## 2020-06-15 ENCOUNTER — Encounter: Payer: Self-pay | Admitting: Obstetrics and Gynecology

## 2020-06-15 NOTE — Telephone Encounter (Signed)
Patient sent a My Chart email this morning: "Hi Dr. Quincy Simmonds, [place] like to make an audio appointment with you. I think I've made a decision, I do have a couple of questions.  This bleeding has became constant  and bleeding this much concerns me. I feel that we need to go ahead and get this done as soon as we can. I have to get all this to prayerfully stop. Please have someone call to schedule the phone visit. And we can talk about my decision and see if you think it is the best one. Thanks, Cassie [name]"  Dr. Edison Pace you okay with her having a virtual visit with you to discuss?

## 2020-06-15 NOTE — Telephone Encounter (Signed)
Patient is scheduled for 06/17/20 at 8:30 am.

## 2020-06-16 NOTE — Progress Notes (Addendum)
GYNECOLOGY  VISIT   HPI: 60 y.o.   Married  Caucasian  female   G0P0000 with Patient's last menstrual period was 05/09/2010 (approximate).   here for Webex visit.  Webex not working.  Volume is not functioning.  I will call her on the phone instead. I called her on her cell per DPI.  She gives permission for the visit today.  Started at 8:55 am.  Finished at 9:25 am.  She states since 06/10/20, the bleeding has increased and she is bleeding consistently.  It is like she is on her menses and thick blood with clotting.  Having continuous cramping but is not at strong.  She does have pain relief of the pain at night.   She does have cardiac arrhythmias.  She has had episodes of atrial fibrillation in the past.  She does have episodes of rapid heart rate, 60s up to 130. She does see cardiology.    Has a history of leg cramping with pelvic exams.   She states a history of an abnormal pap many years ago.  Had follow up which is normal.  No treatment done.   She is asking about abnormal bleeding and Covid vaccine.  She is searching for answers why the bleeding is occurring.  She does take Eliquis due to a history of PE.  GYNECOLOGIC HISTORY: Patient's last menstrual period was 05/09/2010 (approximate). Contraception:  none Menopausal hormone therapy:  Aygestin Last mammogram:  07/2019 normal per patient:[facility] Last pap smear: 06-20-19 Neg:Neg HR HPV        OB History    Gravida  0   Para  0   Term  0   Preterm  0   AB  0   Living  0     SAB  0   IAB  0   Ectopic  0   Multiple  0   Live Births  0              Patient Active Problem List   Diagnosis Date Noted  . Leg swelling 02/27/2020  . Hyperlipidemia 02/27/2020  . Head pain 10/25/2019  . Elevated LFTs 10/25/2019  . Aortic atherosclerosis ([place]) 08/22/2019  . NSVT (nonsustained ventricular tachycardia) ([place]) 07/10/2019  . Right lower quadrant abdominal pain 07/08/2019  . Simple endometrial hyperplasia  06/23/2019  . Postmenopausal bleeding 04/03/2019  . Cough variant asthma vs UACS from gerd 01/01/2018  . PAC (premature atrial contraction) 08/17/2017  . PVC (premature ventricular contraction) 08/17/2017  . PSVT (paroxysmal supraventricular tachycardia) ([place]) 08/17/2017  . Leg edema 05/19/2017  . Other fatigue 12/10/2016  . Myocardial bridge 12/10/2016  . Coronary artery calcification 12/10/2016  . History of pulmonary embolism 12/10/2016  . GERD (gastroesophageal reflux disease) 07/12/2016  . Shortness of breath 05/31/2016  . Chronic venous insufficiency 05/31/2016  . UC (ulcerative colitis) ([place]) 01/28/2015  . Routine general medical examination at a health care facility 06/27/2014  . Left medial knee pain 07/17/2013  . Adrenal incidentaloma ([place]) 03/25/2013  . Allergic rhinitis 09/13/2011  . B12 deficiency 11/28/2008  . Vitamin D deficiency 06/30/2008  . Palpitations 06/30/2008  . Morbid obesity ([place]) 10/15/2007  . Anxiety about health 08/23/2007  . Cardiac dysrhythmia 08/23/2007    Past Medical History:  Diagnosis Date  . Adrenal nodule ([place])   . Allergy   . Anemia    in her early twenties  . Anxiety   . Arthritis    knees,thumbs  . Cardiac arrhythmia    palpitations,PVC'sAC  .  Cataract    bilateral removed   . Cyst of kidney, acquired    cyst right kidney- benign  . Degenerative disc disease, lumbar   . Dyspnea   . Edema   . Gallstones   . History of anemia    20 yrs. ago not now- pt denies   . Morbid obesity ([place])   . Osteoarthritis   . Other allergy, other than to medicinal agents   . Panic disorder   . Pulmonary embolism ([place])   . Run of atrial premature complexes   . Ulcerative colitis, unspecified   . Vitamin B12 deficiency   . Vitamin D deficiency     Past Surgical History:  Procedure Laterality Date  . BREAST BIOPSY  11/2011   Left- benign  . CATARACT EXTRACTION Right   . CATARACT EXTRACTION Left   . CHOLECYSTECTOMY N/A 01/17/2014    Procedure: LAPAROSCOPIC CHOLECYSTECTOMY;  Surgeon: Excell Seltzer, MD;  Location: [facility];  Service: General;  Laterality: N/A;  . COLONOSCOPY    . DILATATION & CURETTAGE/HYSTEROSCOPY WITH MYOSURE N/A 12/23/2019   Procedure: DILATATION & CURETTAGE, HYSTEROSCOPY WITH MYOSURE, RESECTION OF POLYPOID TISSUE;  Surgeon: Megan Salon, MD;  Location: [facility];  Service: Gynecology;  Laterality: N/A;  . RETINAL LASER PROCEDURE Left   . SIGMOIDOSCOPY      Current Outpatient Medications  Medication Sig Dispense Refill  . acetaminophen (TYLENOL) 500 MG tablet Take 1,000 mg by mouth every 6 (six) hours as needed for moderate pain or headache.    . ALPRAZolam (XANAX) 1 MG tablet TAKE 1/2 TO 1 TABLET BY MOUTH 2 TIMES DAILY AS NEEDED FOR ANXIETY. (Patient taking differently: Take 0.5 mg in am and 1 mg at hs) 60 tablet 2  . atenolol (TENORMIN) 50 MG tablet Take 0.5 tablets (25 mg total) by mouth 2 (two) times daily. 45 tablet 3  . ELIQUIS 5 MG TABS tablet TAKE 1 TABLET BY MOUTH TWICE DAILY 60 tablet 6  . loperamide (IMODIUM A-D) 2 MG tablet Take 2 mg by mouth 4 (four) times daily as needed for diarrhea or loose stools.    . norethindrone (AYGESTIN) 5 MG tablet TAKE 2 TABLETS(10 MG) BY MOUTH DAILY (Patient taking differently: Take 5 mg by mouth daily with supper.) 60 tablet 5  . Polyethylene Glycol 400 (VISINE DRY EYE RELIEF OP) Place 1 drop into both eyes daily as needed (dry eye).    . Probiotic Product (PROBIOTIC-10 PO) Take 2 tablets by mouth daily.    . Simethicone (GAS-X PO) Take 2 tablets by mouth 4 (four) times daily as needed (For gas.). Do not exceed 6 tablets in a 24 hour period.     No current facility-administered medications for this visit.     ALLERGIES: Doxycycline, Hydrocodone, Sudafed [pseudoephedrine hcl], Acular [ketorolac tromethamine], Codeine, Diphenhydramine hcl, Lactose intolerance (gi), Mesalamine, Sulfasalazine, Azithromycin, Caffeine, Penicillins, and Prednisone  Family History   Problem Relation Age of Onset  . Heart disease Father 70       pacemaker  . Atrial fibrillation Father   . Prostate cancer Father   . Hypertension Maternal Aunt   . Endometrial cancer Maternal Aunt   . Hypertension Maternal Grandfather   . Colon cancer Neg Hx   . Stomach cancer Neg Hx   . Colon polyps Neg Hx   . Esophageal cancer Neg Hx   . Rectal cancer Neg Hx   . Ovarian cancer Neg Hx   . Pancreatic cancer Neg Hx   . Breast cancer  Neg Hx     Social History   Socioeconomic History  . Marital status: Married    Spouse name: Not on file  . Number of children: 0  . Years of education: Not on file  . Highest education level: Not on file  Occupational History  . Occupation: Arboriculturist: UNEMPLOYED  Tobacco Use  . Smoking status: Former Smoker    Packs/day: 2.00    Years: 12.00    Pack years: 24.00    Types: Cigarettes    Quit date: 03/07/1979    Years since quitting: 41.3  . Smokeless tobacco: Never Used  Vaping Use  . Vaping Use: Never used  Substance and Sexual Activity  . Alcohol use: No    Alcohol/week: 0.0 standard drinks  . Drug use: No  . Sexual activity: Not Currently    Partners: Male    Birth control/protection: Post-menopausal  Other Topics Concern  . Not on file  Social History Narrative   Married: 0 kids   Regular exercise: walks 2 times a week   Caffeine use: none   Social Determinants of Radio broadcast assistant Strain: Not on file  Food Insecurity: Not on file  Transportation Needs: Not on file  Physical Activity: Not on file  Stress: Not on file  Social Connections: Not on file  Intimate Partner Violence: Not on file    Review of Systems  All other systems reviewed and are negative.   PHYSICAL EXAMINATION:    LMP 05/09/2010 (Approximate)      ASSESSMENT  Postmenopausal bleeding with simple hyperplasia history.  On Aygestin.  Hx PE.  On Elquis.  Hx of atrial fibrillation per patient.   PLAN  We discussed her  risk factors for hyperplasia, her biology and body habitus. Eliquis may be somewhat contributing to her bleeding although not the primary reason.  Will proceed forward with office endometrial biopsy.  She will increase Aygestin to 5 mg po bid temporarily until endometrial biopsy is done.  Declines Flexeril prior to procedure.  Will then consider Mirena IUD depending on the endometrial biopsy results.   30 minutes  total time was spent for this patient encounter, including preparation, face-to-face counseling with the patient, coordination of care, and documentation of the encounter.  Addendum: Patient was at home for the call and I was in my office at work.

## 2020-06-17 ENCOUNTER — Telehealth (INDEPENDENT_AMBULATORY_CARE_PROVIDER_SITE_OTHER): Payer: 59 | Admitting: Obstetrics and Gynecology

## 2020-06-17 ENCOUNTER — Encounter: Payer: Self-pay | Admitting: Obstetrics and Gynecology

## 2020-06-17 ENCOUNTER — Telehealth: Payer: Self-pay | Admitting: Obstetrics and Gynecology

## 2020-06-17 DIAGNOSIS — Z7901 Long term (current) use of anticoagulants: Secondary | ICD-10-CM | POA: Diagnosis not present

## 2020-06-17 DIAGNOSIS — Z8742 Personal history of other diseases of the female genital tract: Secondary | ICD-10-CM

## 2020-06-17 DIAGNOSIS — N950 Postmenopausal bleeding: Secondary | ICD-10-CM | POA: Diagnosis not present

## 2020-06-17 NOTE — Telephone Encounter (Signed)
Please schedule and precert endometrial biopsy in the office in the procedure room.  I need one full hour of time for this procedure.   Patient has postmenopausal bleeding and a history of simple endometrial hyperplasia.

## 2020-06-18 NOTE — Telephone Encounter (Signed)
Appointment scheduled for Jun 25, 2020 @ 10:30 .

## 2020-06-18 NOTE — Progress Notes (Addendum)
Cardiology Office Note:    Date:  06/19/2020   ID:  Cassie [name], DOB 1961-01-28, MRN [id number]  PCP:  Hoyt Koch, MD  [facility] Cardiologist:  Nelva Bush, MD  [facility] Electrophysiologist:  None   Referring MD: Hoyt Koch, *   Chief Complaint: fluctuating heart rate  History of Present Illness:    Cassie [name] is a 60 y.o. female with a hx of hypertension, brief PSVT and PVCs, chest pain with myocardial bridging in the mid LAD, pulmonary embolism on chronic anticoagulation with Eliquis, ulcerative colitis, vitamin B12 deficiency, vitamin D deficiency, anxiety, morbid obesity.  Patient had a heart monitor February 2021 showing predominantly sinus rhythm with an average heart rate of 54, PACs PVCs, proximal SVT with no sustained arrhythmia or prolonged pause.  Echo 08/27/2019 showed LVEF 60 to 88%, grade 1 diastolic dysfunction, mildly dilated left atrium.  Patient was last seen 02/26/2020 in a virtual visit for palpitations.  She reported occasional skipped heartbeats.  Is taking atenolol 12.5 daily.   Today, the patient reports she occasionally has heart fluttering, which is normal for her. On Sunday night, however,  the patient experienced prolonged heart palpitations with elevated heart rates, which was different than prior experiences. She was watching TV, sitting in the bed, when she felt her heart beating really fast and could hear it in her ears. Heart rate is normally 55-62bpm. She checked her heart rate with a pulse ox and it was in the high 70-80s.After an hour it went back down to the normal range. The next night the same thing occurred, but heart rate went even higher, up to 136, when she walked to the bathroom. She did not check her BP during the episode. Denies any other changes on those days. No different medicaitons. No caffeine. Normal BP 120/60s. During the episode she had no chest pain or sob. She had lightheadedness but this is apart  form the episode. She felt a little flushed and hands felt clammy. No headache. No recent fever or chills. She reported improved lower leg edema. No orthopnea. She takes atenolol 12.94m BID. She took an extra 527mof atenolol when the episodes occurred, but is unsure if this helped.  Of note the patient has been under a lot of stress within the last year. She started having vaginal bleeding a year ago and was started on progresterone but she did not tolerate that, liver enzymes went up. She has had multiple biopsies and D&Cs, and has another biopsy one coming up. She is still having abdominal pain and cramping daily. Also has been having issues with her colitis as well as family stressors.   Past Medical History:  Diagnosis Date  . Adrenal nodule ([place]  . Allergy   . Anemia    in her early twenties  . Anxiety   . Arthritis    knees,thumbs  . Cardiac arrhythmia    palpitations,PVC'sAC  . Cataract    bilateral removed   . Cyst of kidney, acquired    cyst right kidney- benign  . Degenerative disc disease, lumbar   . Dyspnea   . Edema   . Gallstones   . History of anemia    20 yrs. ago not now- pt denies   . Morbid obesity ([place]  . Osteoarthritis   . Other allergy, other than to medicinal agents   . Panic disorder   . Pulmonary embolism ([place]  . Run of atrial premature complexes   .  Ulcerative colitis, unspecified   . Vitamin B12 deficiency   . Vitamin D deficiency     Past Surgical History:  Procedure Laterality Date  . BREAST BIOPSY  11/2011   Left- benign  . CATARACT EXTRACTION Right   . CATARACT EXTRACTION Left   . CHOLECYSTECTOMY N/A 01/17/2014   Procedure: LAPAROSCOPIC CHOLECYSTECTOMY;  Surgeon: Excell Seltzer, MD;  Location: [facility];  Service: General;  Laterality: N/A;  . COLONOSCOPY    . DILATATION & CURETTAGE/HYSTEROSCOPY WITH MYOSURE N/A 12/23/2019   Procedure: DILATATION & CURETTAGE, HYSTEROSCOPY WITH MYOSURE, RESECTION OF POLYPOID TISSUE;  Surgeon: Megan Salon, MD;  Location: [facility];  Service: Gynecology;  Laterality: N/A;  . RETINAL LASER PROCEDURE Left   . SIGMOIDOSCOPY      Current Medications: Current Meds  Medication Sig  . acetaminophen (TYLENOL) 500 MG tablet Take 1,000 mg by mouth every 6 (six) hours as needed for moderate pain or headache.  . ALPRAZolam (XANAX) 1 MG tablet TAKE 1/2 TO 1 TABLET BY MOUTH 2 TIMES DAILY AS NEEDED FOR ANXIETY. (Patient taking differently: Take 0.5 mg in am and 1 mg at hs)  . ELIQUIS 5 MG TABS tablet TAKE 1 TABLET BY MOUTH TWICE DAILY  . loperamide (IMODIUM A-D) 2 MG tablet Take 2 mg by mouth 4 (four) times daily as needed for diarrhea or loose stools.  . norethindrone (AYGESTIN) 5 MG tablet TAKE 2 TABLETS(10 MG) BY MOUTH DAILY (Patient taking differently: Take 5 mg by mouth daily with supper.)  . Polyethylene Glycol 400 (VISINE DRY EYE RELIEF OP) Place 1 drop into both eyes daily as needed (dry eye).  . Probiotic Product (PROBIOTIC-10 PO) Take 2 tablets by mouth daily.  . Simethicone (GAS-X PO) Take 2 tablets by mouth 4 (four) times daily as needed (For gas.). Do not exceed 6 tablets in a 24 hour period.  . [DISCONTINUED] atenolol (TENORMIN) 50 MG tablet Take 0.5 tablets (25 mg total) by mouth 2 (two) times daily.     Allergies:   Doxycycline, Hydrocodone, Sudafed [pseudoephedrine hcl], Acular [ketorolac tromethamine], Codeine, Diphenhydramine hcl, Lactose intolerance (gi), Mesalamine, Sulfasalazine, Azithromycin, Caffeine, Penicillins, and Prednisone   Social History   Socioeconomic History  . Marital status: Married    Spouse name: Not on file  . Number of children: 0  . Years of education: Not on file  . Highest education level: Not on file  Occupational History  . Occupation: Arboriculturist: UNEMPLOYED  Tobacco Use  . Smoking status: Former Smoker    Packs/day: 2.00    Years: 12.00    Pack years: 24.00    Types: Cigarettes    Quit date: 03/07/1979    Years since quitting: 41.3  .  Smokeless tobacco: Never Used  Vaping Use  . Vaping Use: Never used  Substance and Sexual Activity  . Alcohol use: No    Alcohol/week: 0.0 standard drinks  . Drug use: No  . Sexual activity: Not Currently    Partners: Male    Birth control/protection: Post-menopausal  Other Topics Concern  . Not on file  Social History Narrative   Married: 0 kids   Regular exercise: walks 2 times a week   Caffeine use: none   Social Determinants of Health   Financial Resource Strain: Not on file  Food Insecurity: Not on file  Transportation Needs: Not on file  Physical Activity: Not on file  Stress: Not on file  Social Connections: Not on file  Family History: The patient's family history includes Atrial fibrillation in her father; Endometrial cancer in her maternal aunt; Heart disease (age of onset: 42) in her father; Hypertension in her maternal aunt and maternal grandfather; Prostate cancer in her father. There is no history of Colon cancer, Stomach cancer, Colon polyps, Esophageal cancer, Rectal cancer, Ovarian cancer, Pancreatic cancer, or Breast cancer.  ROS:   Please see the history of present illness.     All other systems reviewed and are negative.  EKGs/Labs/Other Studies Reviewed:    The following studies were reviewed today:  Echo 08/2019 1. Left ventricular ejection fraction, by estimation, is 60 to 65%. The  left ventricle has normal function. The left ventricle has no regional  wall motion abnormalities. Left ventricular diastolic parameters are  consistent with Grade I diastolic  dysfunction (impaired relaxation).  2. Right ventricular systolic function is normal. The right ventricular  size is normal. Tricuspid regurgitation signal is inadequate for assessing  PA pressure.  3. Left atrial size was mildly dilated.   Long term monitor 06/2019  The patient was monitored for 13 days, 23 hours.  The predominant rhythm was sinus with an average rate of 54 bpm (range  43 to 102 bpm in sinus).  There were rare PACs and PVCs.  A single 5 beat run of ventricular tachycardia occurred with a maximum rate of 150 bpm. This was immediately followed by a short episode of accelerated idioventricular rhythm.  19 episodes of PSVT occurred, lasting up to 19.3 seconds with a maximum rate of 150 bpm.  There was no sustained arrhythmia or prolonged pause.  Patient triggered events correspond to sinus rhythm, sinus rhythm with PACs, sinus rhythm with PVCs, PSVT, and NSVT/AIVR.   Predominantly sinus rhythm with rare PACs and PVCs as well as brief PSVT and a single 5 beat run of NSVT followed by transient accelerated idioventricular rhythm.  EKG:  EKG is  ordered today.  The ekg ordered today demonstrates SR, 61 bpm, TWI V3  Recent Labs: 07/30/2019: Magnesium 2.1; TSH 3.91 01/31/2020: ALT 17 05/28/2020: BUN 9; Creatinine, Ser 0.93; Hemoglobin 15.0; Platelets 258.0; Potassium 3.6; Sodium 136  Recent Lipid Panel    Component Value Date/Time   CHOL 162 01/31/2020 0820   TRIG 66 01/31/2020 0820   HDL 29 (L) 01/31/2020 0820   CHOLHDL 5.6 (H) 01/31/2020 0820   VLDL 17.4 06/14/2018 0947   LDLCALC 117 (H) 01/31/2020 0820     Risk Assessment/Calculations:       Physical Exam:    VS:  BP 110/72 (BP Location: Left Arm, Patient Position: Sitting, Cuff Size: Large)   Pulse 61   Ht 5' 5"  (1.651 m)   LMP 05/09/2010 (Approximate)   SpO2 98%   BMI 54.58 kg/m     Wt Readings from Last 3 Encounters:  06/01/20 (!) 328 lb (148.8 kg)  02/26/20 (!) 328 lb (148.8 kg)  12/23/19 (!) 328 lb 0.7 oz (148.8 kg)     GEN: Well nourished, well developed in no acute distress HEENT: Normal NECK: No JVD; No carotid bruits LYMPHATIRRR, no murmurs, rubs, gallops RESPIRATORY:  Clear to auscultation without rales, wheezing or rhonchi  ABDOMEN: Soft, non-tender, non-distended MUSCULOSKELETAL:  No edema; No deformity  SKIN: Warm and dry NEUROLOGIC:  Alert and oriented x  3 PSYCHIATRIC:  Normal affect   ASSESSMENT:    1. Palpitations   2. PSVT (paroxysmal supraventricular tachycardia) ([place])   3. Hyperlipidemia, unspecified hyperlipidemia type   4. Morbid obesity ([place])  PLAN:    In order of problems listed above:  Palpitations PSVT/Atrail tachycardia Patient has known history of PACs/PVCs, PSVT by heart monitor in 07/2019. She had an echo 08/2019 showed normal LV function with G1DD. More recently she had 2 episodes of palpitations that were different than her normal heart fluttering. Bearing down and deep breathing did not help it. Resting heart rate is generally 50-60s, but she noted heart rate went up to 70-80s, and when she ambulated HR peaked to 130sbpm. Episodes were about an hour before self-resolution. She denies chest pain or change in sob. Denies known triggers or diet changes, however did mention she is under a lot of stress. She takes atenolol 12.50m BID. She has not tolerated CCB in the past and has been hesitant to change to another BB. I recommend increasing the first dose of atenolol to 259mand will leave the second dose the same at 12.4m51maily. BP can tolerate. I will order a 2 week heart monitor. Patient will monitor her heart rate and home and let us [place]ow if it gets too low. If this happens we can go back down on the atenolol and try taking doses at different times of day.   DOE and chronic lower leg edema DOE is unchanged. Suspect most DOE is due to deconditioning and inactivity. Lower leg edema has improved over the last 3 months. Euvolemic on exam today. Last echo showed normal LV function and G1DD.    Mild atherosclerosis of aortic arch and LAD by chest CT Hyperlipidemia  Recent chest CT (06/10/20) showed mild atherosclerotic calcification of the aortic arch and LAD. Most recent LDL was 117. Can re-check lipid panel at follow-up. Recommend lifestyle changes. Continue Aspirin.  Morbid obesity Weight is unchanged. Recommend weight  loss/lifestyle changes.    Disposition: Follow up 4-6 weeks with APP      Signed, Lovina Zuver H FNinfa MeekerA-C  06/19/2020 4:00 PM    [facility]

## 2020-06-19 ENCOUNTER — Ambulatory Visit (INDEPENDENT_AMBULATORY_CARE_PROVIDER_SITE_OTHER): Payer: 59 | Admitting: Medical

## 2020-06-19 ENCOUNTER — Ambulatory Visit (INDEPENDENT_AMBULATORY_CARE_PROVIDER_SITE_OTHER): Payer: 59

## 2020-06-19 ENCOUNTER — Encounter: Payer: Self-pay | Admitting: Medical

## 2020-06-19 ENCOUNTER — Other Ambulatory Visit: Payer: Self-pay

## 2020-06-19 VITALS — BP 110/72 | HR 61 | Ht 65.0 in

## 2020-06-19 DIAGNOSIS — E785 Hyperlipidemia, unspecified: Secondary | ICD-10-CM | POA: Diagnosis not present

## 2020-06-19 DIAGNOSIS — R002 Palpitations: Secondary | ICD-10-CM

## 2020-06-19 DIAGNOSIS — I471 Supraventricular tachycardia: Secondary | ICD-10-CM

## 2020-06-19 MED ORDER — ATENOLOL 50 MG PO TABS: ORAL_TABLET | ORAL | Status: DC

## 2020-06-19 NOTE — Patient Instructions (Addendum)
Medication Instructions:  Your physician has recommended you make the following change in your medication:   CHANGE Atenolol to 110m in the morning and 12.540min the evening  *If you need a refill on your cardiac medications before your next appointment, please call your pharmacy*   Lab Work: No lab work ordered today.   Testing/Procedures: Your physician has recommended that you wear a Zio monitor. This monitor is a medical device that records the heart's electrical activity. Doctors most often use these monitors to diagnose arrhythmias. Arrhythmias are problems with the speed or rhythm of the heartbeat. The monitor is a small device applied to your chest. You can wear one while you do your normal daily activities. While wearing this monitor if you have any symptoms to push the button and record what you felt. Once you have worn this monitor for the period of time provider prescribed (Usually 14 days), you will return the monitor device in the postage paid box. Once it is returned they will download the data collected and provide usKoreaith a report which the provider will then review and we will call you with those results. Important tips:  1. Avoid showering during the first 24 hours of wearing the monitor. 2. Avoid excessive sweating to help maximize wear time. 3. Do not submerge the device, no hot tubs, and no swimming pools. 4. Keep any lotions or oils away from the patch. 5. After 24 hours you may shower with the patch on. Take brief showers with your back facing the shower head.  6. Do not remove patch once it has been placed because that will interrupt data and decrease adhesive wear time. 7. Push the button when you have any symptoms and write down what you were feeling. 8. Once you have completed wearing your monitor, remove and place into box which has postage paid and place in your outgoing mailbox.  9. If for some reason you have misplaced your box then call our office and we can  provide another box and/or mail it off for you.        Follow-Up: [facility] and [facility] needs are our priority.  As part of our continuing mission to provide you with exceptional heart care, we have created designated Provider Care Teams.  These Care Teams include your primary Cardiologist (physician) and Advanced Practice Providers (APPs -  Physician Assistants and Nurse Practitioners) who all work together to provide you with the care you need, when you need it.  We recommend signing up for the patient portal called "MyChart".  Sign up information is provided on this After Visit Summary.  MyChart is used to connect with patients for Virtual Visits (Telemedicine).  Patients are able to view lab/test results, encounter notes, upcoming appointments, etc.  Non-urgent messages can be sent to your provider as well.   To learn more about what you can do with MyChart, go to [email or web address]   Your next appointment:   6 week(s)  The format for your next appointment:   In Person  Provider:   You may see ChNelva BushMD or one of the following Advanced Practice Providers on your designated Care Team:    ChMurray HodgkinsNP  RyChristell FaithPA-C  JaMarrianne MoodPA-C  Cadence FuOld TownPAVermontCaLaurann MontanaNP

## 2020-06-20 ENCOUNTER — Encounter: Payer: Self-pay | Admitting: Obstetrics and Gynecology

## 2020-06-21 ENCOUNTER — Encounter: Payer: Self-pay | Admitting: Obstetrics and Gynecology

## 2020-06-22 ENCOUNTER — Encounter: Payer: Self-pay | Admitting: Obstetrics and Gynecology

## 2020-06-22 ENCOUNTER — Encounter: Payer: Self-pay | Admitting: Family

## 2020-06-22 ENCOUNTER — Telehealth: Payer: Self-pay | Admitting: *Deleted

## 2020-06-22 NOTE — Telephone Encounter (Signed)
Patient sent several patient advice request over the weekend about heavy bleeding that occurred. I called patient and she reports Saturday morning was the heaviest day of bleeding she is not wearing tampon or pad, but using toilet paper for bleeding, patient said she can't use pads as they don't fit well. Patient said she passed a large clot (about a 2-3 Tablespoon) on Saturday am. She stopped her Eliquis 5 mg Saturday night only and restarted Eliqulis 5 mg again on Sunday. Patient said Sunday morning rarely any blood just a small clot, this am she filled the toilet paper with small clots again this am but this was in a 2 hours period. Patient is scheduled endometrial bx on 06/25/20. She is still taking the Aygestin 5 mg tablet twice daily as directed.  She asked if anything should be done different? Please advise

## 2020-06-22 NOTE — Telephone Encounter (Signed)
Please have her increase her Aygestin to 5 mg po tid.  Keep appointment for endometrial biopsy.  Have her call if the bleeding does not improve.

## 2020-06-22 NOTE — Telephone Encounter (Signed)
Patient informed with below note.

## 2020-06-23 ENCOUNTER — Telehealth: Payer: Self-pay

## 2020-06-23 NOTE — Telephone Encounter (Signed)
Please check to see how patient's bleeding is doing today.  If she is still having heavy bleeding, I think she will need to proceed with a dilation and curettage and placement of Mirena IUD in the OR this week instead of doing an office procedure.   She takes Eliquis and would need to stop this in preparation for surgery and then take Lovenox in the interim. Please reach out to her prescriber of the Eliquis to receive recommendations for this protocol for the patient.

## 2020-06-23 NOTE — Telephone Encounter (Addendum)
I spoke with patient and informed her. She has had no bleeding since we last talked several hours ago. She will plan to keep her office visit.

## 2020-06-23 NOTE — Telephone Encounter (Signed)
If the bleeding is decreasing, I recommend proceeding forward with the office endometrial biopsy.

## 2020-06-23 NOTE — Telephone Encounter (Signed)
I called patient to follow up on her bleeding per Dr. Elza Rafter request.  Patient said that taking the Aygestin TID decreased the bleeding all night. When I asked her this morning how her bleeding was she said needed to go check and see.  There was not a flow.  She went to restroom and passed a teaspoon size clot.  Prior to that she had not noticed bleeding.   Patient said to express to you that she is concerned that since the oral progestin has not really made her bleeding stop that if you put the Mirena in she is going to continue to bleed like this for months and it worries her. The clots scare her.  She also reported she did not take her Eliquis Saturday night, took it bid on Sunday, did not take any on Monday.  She reached out to her hematologist yesterday but has not heard back from them.  What to rec?

## 2020-06-24 ENCOUNTER — Telehealth: Payer: Self-pay | Admitting: Obstetrics and Gynecology

## 2020-06-24 ENCOUNTER — Encounter: Payer: Self-pay | Admitting: Obstetrics and Gynecology

## 2020-06-24 ENCOUNTER — Telehealth: Payer: Self-pay

## 2020-06-24 NOTE — Telephone Encounter (Signed)
I called [employer] and Spoke with Kim A. Call SNG#[id number].  I confirmed that the Mirena IUD is covered under patient's medical plan.  The CPT [phone] required PA and it was expedited and Approval Autho# [id number] and is valid until 09/24/2020.  [id number] and [id number] did not require PA.

## 2020-06-24 NOTE — Telephone Encounter (Signed)
Spoke with [employer] in [employer].  BMI 54.58, will need to schedule at [facility] OR.  Patient scheduled for Hysteroscopy D&C, Mirena IUD insertion on 06/26/20 at 1145.   Call placed to patient. Patient declines to proceed with surgery on 06/26/20, does not want to schedule surgery on a Friday, expressed concerns about Mirena IUD. Patient request to discuss hysterectomy. Advised patient I will review with Dr. Quincy Simmonds and return call. Patient agreeable.   Call reviewed with Dr. Quincy Simmonds, call returned to patient. Reviewed plan of care. Will keep OV with EMB scheduled for 2/17 for further evaluation and discussion with Dr. Quincy Simmonds. Will keep surgery as scheduled for 2/18, can reschedule if needed after OV. Patient is agreeable to plan.   Routing to Dr. Antony Blackbird.   Cc: Darius Bump.

## 2020-06-24 NOTE — Telephone Encounter (Signed)
I recommend proceeding with hysteroscopy and dilation and curettage and placement of Mirena IUD on Friday this week.   Please precert and schedule.   Patient will need a preop visit with me and with the hospital tomorrow, including anesthesia. She will need Covid testing.   Please contact her hematologist for recommendations regarding her Eliquis.   I would anticipate surgery on Friday am at [facility] if possible.

## 2020-06-25 ENCOUNTER — Other Ambulatory Visit (HOSPITAL_COMMUNITY)
Admission: RE | Admit: 2020-06-25 | Discharge: 2020-06-25 | Disposition: A | Discharge status: Home / Self Care | Payer: 59 | Source: Ambulatory Visit | Attending: Obstetrics and Gynecology | Admitting: Obstetrics and Gynecology

## 2020-06-25 ENCOUNTER — Other Ambulatory Visit: Payer: Self-pay

## 2020-06-25 ENCOUNTER — Ambulatory Visit: Payer: 59 | Admitting: Obstetrics and Gynecology

## 2020-06-25 ENCOUNTER — Encounter: Payer: Self-pay | Admitting: Family

## 2020-06-25 ENCOUNTER — Encounter (HOSPITAL_COMMUNITY): Payer: Self-pay | Admitting: Physician Assistant

## 2020-06-25 ENCOUNTER — Encounter: Payer: Self-pay | Admitting: Obstetrics and Gynecology

## 2020-06-25 ENCOUNTER — Other Ambulatory Visit (HOSPITAL_COMMUNITY): Payer: 59

## 2020-06-25 VITALS — BP 148/80 | HR 86 | Resp 22

## 2020-06-25 DIAGNOSIS — N950 Postmenopausal bleeding: Secondary | ICD-10-CM

## 2020-06-25 DIAGNOSIS — Z8742 Personal history of other diseases of the female genital tract: Secondary | ICD-10-CM

## 2020-06-25 DIAGNOSIS — Z01812 Encounter for preprocedural laboratory examination: Secondary | ICD-10-CM | POA: Insufficient documentation

## 2020-06-25 DIAGNOSIS — Z20822 Contact with and (suspected) exposure to covid-19: Secondary | ICD-10-CM | POA: Diagnosis not present

## 2020-06-25 LAB — SARS CORONAVIRUS 2 (TAT 6-24 HRS): SARS Coronavirus 2: NEGATIVE

## 2020-06-25 NOTE — Telephone Encounter (Signed)
Per review of Epic, patient is scheduled for PAT appt on 2/18 at 8am.

## 2020-06-25 NOTE — Telephone Encounter (Signed)
Spoke with Cassie [name] in [employer], surgery r/s to 06/29/20 at 1430, [facility] OR, to follow second case.   Call placed to [facility] PAT at [phone], left message, patient will need anesthesia consult prior to surgery on 06/29/20. Return call to [place], [place] at [place] to confirm.   Call placed to [place], Doyce Loose, NP.  Left message with Kenney Houseman, last dose of Eliquis taken on 2/15. Patient is scheduled for hysteroscopy D&C, Mirena IUD insertion on 2/21. Please advise on when to resume Eliquis post op and if any recommendations for Lovenox or Heparin?

## 2020-06-25 NOTE — Pre-Procedure Instructions (Signed)
Cassie [name]  06/25/2020      Your procedure is scheduled on Monday, February 21.  Report to [facility], Main Entrance or Entrance "A" at  12: 59 PM                Your surgery or procedure is scheduled to begin at  2:45 PM   Call this number if you have problems the morning of surgery: [phone]  This is the number for the Pre- Surgical Desk.             Remember:  Do not eat or drink after midnight Sunday, February 21  You may drink clear liquids until 11:45 AM .   Clear liquids allowed are:                   Water, Juice (non-citric and without pulp - diabetics please choose diet or no sugar options), Carbonated beverages - (diabetics please choose diet or no sugar options), Clear Tea, Black Coffee only (no creamer, milk or cream including half and half), Plain Jell-O only (diabetics please choose diet or no sugar options), Gatorade (diabetics please choose diet or no sugar options) and Plain Popsicles only    Take these medicines the morning of surgery with A SIP OF WATER: atenolol (TENORMIN) norethindrone (AYGESTIN)  If needed: acetaminophen (TYLENOL)  ALPRAZolam Duanne Moron)  Follow you Dr. Instructions regarding Eliquis.  STOP taking Aspirin, Aspirin Products (Goody Powder, Excedrin Migraine), Ibuprofen (Advil), Naproxen (Aleve), Vitamins and Herbal Products (ie Fish Oil).    Special instructions:    [place]- Preparing For Surgery  Before surgery, you can play an important role. Because skin is not sterile, your skin needs to be as free of germs as possible. You can reduce the number of germs on your skin by washing with CHG (chlorahexidine gluconate) Soap before surgery.  CHG is an antiseptic cleaner which kills germs and bonds with the skin to continue killing germs even after washing.    Oral Hygiene is also important to reduce your risk of infection.  Remember - BRUSH YOUR TEETH THE MORNING OF SURGERY WITH YOUR REGULAR TOOTHPASTE  Please do not use  if you have an allergy to CHG or antibacterial soaps. If your skin becomes reddened/irritated stop using the CHG.  Do not shave (including legs and underarms) for at least 48 hours prior to first CHG shower. It is OK to shave your face.  Please follow these instructions carefully.   1. Shower the NIGHT BEFORE SURGERY and the MORNING OF SURGERY with CHG.   2. If you chose to wash your hair, wash your hair first as usual with your normal shampoo.  3. After you shampoo, wash your face and private area with the soap you use at home, then rinse your hair and body thoroughly to remove the shampoo and soap.  4. Use CHG as you would any other liquid soap. You can apply CHG directly to the skin and wash gently with a scrungie or a clean washcloth.   5. Apply the CHG Soap to your body ONLY FROM THE NECK DOWN.  Do not use on open wounds or open sores. Avoid contact with your eyes, ears, mouth and genitals (private parts).   6. Wash thoroughly, paying special attention to the area where your surgery will be performed.  7. Thoroughly rinse your body with warm water from the neck down.  8. DO NOT shower/wash with your normal soap after using and  rinsing off the CHG Soap.  9. Pat yourself dry with a CLEAN TOWEL.  10. Wear CLEAN PAJAMAS to bed the night before surgery, wear comfortable clothes the morning of surgery  11. Place CLEAN SHEETS on your bed the night of your first shower and DO NOT SLEEP WITH PETS.  Day of Surgery: Shower as instructed above. Do not apply any deodorants/lotions, powders or colognes.  Please wear clean clothes to the hospital/surgery center.   Remember to brush your teeth WITH YOUR REGULAR TOOTHPASTE.  Do not wear jewelry, make-up or nail polish.  Do not shave 48 hours prior to surgery.  Men may shave face and neck.  Do not bring valuables to the hospital.  [facility] is not responsible for any belongings or valuables.  Contacts, dentures or bridgework may not be worn  into surgery.  Leave your suitcase in the car.  After surgery it may be brought to your room.  For patients admitted to the hospital, discharge time will be determined by your treatment team.  Patients discharged the day of surgery will not be allowed to drive home.   Please read over the fact sheets that you were given.

## 2020-06-25 NOTE — Telephone Encounter (Signed)
Spoke with patient. Surgery date request confirmed.  Advised surgery is scheduled for 06/29/20 at 1430, [facility] OR.  Surgery instruction sheet and hospital brochure reviewed.  Patient advised of Covid screening and quarantine requirements and agreeable.   Advised patient I will f/u once I have received further instructions on Eliquis.  Patient verbalizes understanding and is agreeable.

## 2020-06-25 NOTE — Progress Notes (Signed)
GYNECOLOGY  VISIT   HPI: 60 y.o.   Married  Caucasian  female   [id number] with Patient's last menstrual period was 05/09/2010 (approximate).   here for   Endometrial biopsy   Patient's husband is present for the visit today.  Patient is having postmenopausal bleeding and has a history of simple endometrial hyperplasia.   Off Eliquis since Tuesday due to periodic increases in her bleeding.   She went back to taking Aygestin 5 mg daily instead of tid.   The patient is quite concerned about her bleeding and the possibility that treatment with Mirena will increases this further.   GYNECOLOGIC HISTORY: Patient's last menstrual period was 05/09/2010 (approximate). Contraception:  Post-menopausal  Menopausal hormone therapy:  none Last mammogram:  07/2019 normal per patient at [facility] pap smear:   06-20-19 negative, HR HPV negative         OB History    Gravida  0   Para  0   Term  0   Preterm  0   AB  0   Living  0     SAB  0   IAB  0   Ectopic  0   Multiple  0   Live Births  0              Patient Active Problem List   Diagnosis Date Noted  . Leg swelling 02/27/2020  . Hyperlipidemia 02/27/2020  . Head pain 10/25/2019  . Elevated LFTs 10/25/2019  . Aortic atherosclerosis ([place]) 08/22/2019  . NSVT (nonsustained ventricular tachycardia) ([place]) 07/10/2019  . Right lower quadrant abdominal pain 07/08/2019  . Simple endometrial hyperplasia 06/23/2019  . Postmenopausal bleeding 04/03/2019  . Cough variant asthma vs UACS from gerd 01/01/2018  . PAC (premature atrial contraction) 08/17/2017  . PVC (premature ventricular contraction) 08/17/2017  . PSVT (paroxysmal supraventricular tachycardia) ([place]) 08/17/2017  . Leg edema 05/19/2017  . Other fatigue 12/10/2016  . Myocardial bridge 12/10/2016  . Coronary artery calcification 12/10/2016  . History of pulmonary embolism 12/10/2016  . GERD (gastroesophageal reflux disease) 07/12/2016  . Shortness of breath  05/31/2016  . Chronic venous insufficiency 05/31/2016  . UC (ulcerative colitis) ([place]) 01/28/2015  . Routine general medical examination at a health care facility 06/27/2014  . Left medial knee pain 07/17/2013  . Adrenal incidentaloma ([place]) 03/25/2013  . Allergic rhinitis 09/13/2011  . B12 deficiency 11/28/2008  . Vitamin D deficiency 06/30/2008  . Palpitations 06/30/2008  . Morbid obesity ([place]) 10/15/2007  . Anxiety about health 08/23/2007  . Cardiac dysrhythmia 08/23/2007    Past Medical History:  Diagnosis Date  . Adrenal nodule ([place])   . Allergy   . Anemia    in her early twenties  . Anxiety   . Arthritis    knees,thumbs  . Cardiac arrhythmia    palpitations,PVC'sAC  . Cataract    bilateral removed   . Cyst of kidney, acquired    cyst right kidney- benign  . Degenerative disc disease, lumbar   . Dyspnea   . Edema   . Gallstones   . History of anemia    20 yrs. ago not now- pt denies   . Morbid obesity ([place])   . Osteoarthritis   . Other allergy, other than to medicinal agents   . Panic disorder   . Pulmonary embolism ([place])   . Run of atrial premature complexes   . Ulcerative colitis, unspecified   . Vitamin B12 deficiency   . Vitamin D deficiency  Past Surgical History:  Procedure Laterality Date  . BREAST BIOPSY  11/2011   Left- benign  . CATARACT EXTRACTION Right   . CATARACT EXTRACTION Left   . CHOLECYSTECTOMY N/A 01/17/2014   Procedure: LAPAROSCOPIC CHOLECYSTECTOMY;  Surgeon: Excell Seltzer, MD;  Location: [facility];  Service: General;  Laterality: N/A;  . COLONOSCOPY    . DILATATION & CURETTAGE/HYSTEROSCOPY WITH MYOSURE N/A 12/23/2019   Procedure: DILATATION & CURETTAGE, HYSTEROSCOPY WITH MYOSURE, RESECTION OF POLYPOID TISSUE;  Surgeon: Megan Salon, MD;  Location: [facility];  Service: Gynecology;  Laterality: N/A;  . RETINAL LASER PROCEDURE Left   . SIGMOIDOSCOPY      Current Outpatient Medications  Medication Sig Dispense Refill  . acetaminophen  (TYLENOL) 500 MG tablet Take 1,000 mg by mouth every 6 (six) hours as needed for moderate pain or headache.    . ALPRAZolam (XANAX) 1 MG tablet TAKE 1/2 TO 1 TABLET BY MOUTH 2 TIMES DAILY AS NEEDED FOR ANXIETY. (Patient taking differently: Take 0.5-1 mg by mouth See admin instructions. Take 0.5 tablet (0.5 mg) by mouth scheduled in the morning, may take an additional 0.5 tablet (0.5 mg) in the afternoon if needed for anxiety & take 1 tablet (1 mg) by mouth scheduled at bedtime.) 60 tablet 2  . atenolol (TENORMIN) 25 MG tablet Take 12.5-25 mg by mouth See admin instructions. Take 1 tablet (25 mg) by mouth in the morning & take 0.5 tablet (12.5 mg) by mouth in the evening    . atenolol (TENORMIN) 50 MG tablet Take 25 mg in the am and 12.5 mg in the pm.    . norethindrone (AYGESTIN) 5 MG tablet TAKE 2 TABLETS(10 MG) BY MOUTH DAILY (Patient taking differently: Take 5 mg by mouth in the morning, at noon, and at bedtime.) 60 tablet 5  . Probiotic Product (PROBIOTIC-10 PO) Take 2 tablets by mouth daily.    . Simethicone (GAS-X PO) Take 2 tablets by mouth 4 (four) times daily as needed (For gas.). Do not exceed 6 tablets in a 24 hour period.    Marland Kitchen ELIQUIS 5 MG TABS tablet TAKE 1 TABLET BY MOUTH TWICE DAILY (Patient not taking: Reported on 06/25/2020) 60 tablet 6   No current facility-administered medications for this visit.     ALLERGIES: Doxycycline, Hydrocodone, Sudafed [pseudoephedrine hcl], Acular [ketorolac tromethamine], Codeine, Diphenhydramine hcl, Lactose intolerance (gi), Mesalamine, Sulfasalazine, Azithromycin, Caffeine, Penicillins, and Prednisone  Family History  Problem Relation Age of Onset  . Heart disease Father 50       pacemaker  . Atrial fibrillation Father   . Prostate cancer Father   . Hypertension Maternal Aunt   . Endometrial cancer Maternal Aunt   . Hypertension Maternal Grandfather   . Colon cancer Neg Hx   . Stomach cancer Neg Hx   . Colon polyps Neg Hx   . Esophageal  cancer Neg Hx   . Rectal cancer Neg Hx   . Ovarian cancer Neg Hx   . Pancreatic cancer Neg Hx   . Breast cancer Neg Hx     Social History   Socioeconomic History  . Marital status: Married    Spouse name: Not on file  . Number of children: 0  . Years of education: Not on file  . Highest education level: Not on file  Occupational History  . Occupation: Arboriculturist: UNEMPLOYED  Tobacco Use  . Smoking status: Former Smoker    Packs/day: 2.00    Years: 12.00  Pack years: 24.00    Types: Cigarettes    Quit date: 03/07/1979    Years since quitting: 41.3  . Smokeless tobacco: Never Used  Vaping Use  . Vaping Use: Never used  Substance and Sexual Activity  . Alcohol use: No    Alcohol/week: 0.0 standard drinks  . Drug use: No  . Sexual activity: Not Currently    Partners: Male    Birth control/protection: Post-menopausal  Other Topics Concern  . Not on file  Social History Narrative   Married: 0 kids   Regular exercise: walks 2 times a week   Caffeine use: none   Social Determinants of Radio broadcast assistant Strain: Not on file  Food Insecurity: Not on file  Transportation Needs: Not on file  Physical Activity: Not on file  Stress: Not on file  Social Connections: Not on file  Intimate Partner Violence: Not on file    Review of Systems  Genitourinary:       Bleeding seems to be less when taking less aygestin   All other systems reviewed and are negative.   PHYSICAL EXAMINATION:    BP (!) 148/80 (BP Location: Right Arm, Patient Position: Sitting, Cuff Size: Large)   Pulse 86   Resp (!) 22   LMP 05/09/2010 (Approximate)     General appearance: alert, cooperative and appears stated age Head: Normocephalic, without obvious abnormality, atraumatic Neck: no adenopathy, supple, symmetrical, trachea midline and thyroid normal to inspection and palpation Lungs: clear to auscultation bilaterally Heart: regular rate and rhythm   Pelvic: External  genitalia:  no lesions              Urethra:  normal appearing urethra with no masses, tenderness or lesions              Bartholins and Skenes: normal                 Vagina: small dark clot in the vagina.                Cervix:  Not well visualized.                Patient intolerant of stirrups due to leg cramps.  Unable to accomplish endometrial biopsy.   Chaperone was present for exam.  ASSESSMENT  Postmenopausal bleeding.  Hx simple endometrial hyperplasia.  On Aygestin 5 mg daily.  Intolerant of if office exams due to leg spasms.  Off of Eliquis.  Hx PE.   PLAN  Discussion of hysteroscopy with dilation and curettage and placement of Mirena IUD.  Risks, benefits, and alternatives reviewed. Risks include but are not limited to bleeding, infection, damage to surrounding organs including uterine perforation requiring hospitalization and possible laparoscopy, pulmonary edema, reaction to anesthesia, DVT, PE, death, need for further treatment. Surgical expectations and recovery discussed.  Patient wishes to proceed.  Will reach out to anesthesia to plan a preop visit and reach out to hematology to plan for her anticoagulation depending on the date of her procedure.   I do not recommend complete discontinuation of Aygestin as I expect this will prompt increased vaginal bleeding.   40 min  total time was spent for this patient encounter, including preparation, face-to-face counseling with the patient, coordination of care, and documentation of the encounter.

## 2020-06-26 ENCOUNTER — Ambulatory Visit: Payer: 59 | Admitting: Nurse Practitioner

## 2020-06-26 ENCOUNTER — Encounter (HOSPITAL_COMMUNITY)
Admission: RE | Admit: 2020-06-26 | Discharge: 2020-06-26 | Disposition: A | Discharge status: Home / Self Care | Payer: 59 | Source: Ambulatory Visit | Attending: Obstetrics and Gynecology | Admitting: Obstetrics and Gynecology

## 2020-06-26 ENCOUNTER — Other Ambulatory Visit: Payer: Self-pay

## 2020-06-26 ENCOUNTER — Encounter (HOSPITAL_COMMUNITY): Payer: Self-pay

## 2020-06-26 DIAGNOSIS — I471 Supraventricular tachycardia: Secondary | ICD-10-CM | POA: Diagnosis not present

## 2020-06-26 DIAGNOSIS — I2510 Atherosclerotic heart disease of native coronary artery without angina pectoris: Secondary | ICD-10-CM | POA: Insufficient documentation

## 2020-06-26 DIAGNOSIS — R0600 Dyspnea, unspecified: Secondary | ICD-10-CM | POA: Insufficient documentation

## 2020-06-26 DIAGNOSIS — I493 Ventricular premature depolarization: Secondary | ICD-10-CM | POA: Insufficient documentation

## 2020-06-26 DIAGNOSIS — I700 Atherosclerosis of aorta: Secondary | ICD-10-CM | POA: Insufficient documentation

## 2020-06-26 DIAGNOSIS — I472 Ventricular tachycardia: Secondary | ICD-10-CM | POA: Insufficient documentation

## 2020-06-26 DIAGNOSIS — Z01812 Encounter for preprocedural laboratory examination: Secondary | ICD-10-CM | POA: Insufficient documentation

## 2020-06-26 DIAGNOSIS — Z7901 Long term (current) use of anticoagulants: Secondary | ICD-10-CM | POA: Insufficient documentation

## 2020-06-26 DIAGNOSIS — Q245 Malformation of coronary vessels: Secondary | ICD-10-CM | POA: Diagnosis not present

## 2020-06-26 HISTORY — DX: Personal history of other diseases of the digestive system: Z87.19

## 2020-06-26 HISTORY — DX: Other complications of anesthesia, initial encounter: T88.59XA

## 2020-06-26 LAB — CBC
HCT: 47.8 % — ABNORMAL HIGH (ref 36.0–46.0)
Hemoglobin: 14.8 g/dL (ref 12.0–15.0)
MCH: 30.6 pg (ref 26.0–34.0)
MCHC: 31 g/dL (ref 30.0–36.0)
MCV: 98.8 fL (ref 80.0–100.0)
Platelets: 289 10*3/uL (ref 150–400)
RBC: 4.84 MIL/uL (ref 3.87–5.11)
RDW: 14.1 % (ref 11.5–15.5)
WBC: 7.9 10*3/uL (ref 4.0–10.5)
nRBC: 0 % (ref 0.0–0.2)

## 2020-06-26 NOTE — Progress Notes (Addendum)
PCP - Dr. Pricilla Holm  Cardiologist - Dr End  Chest x-ray - na  EKG - 2/11/  Stress Test - no  ECHO - 2021  Cardiac Cath - 2005  Cardiac Monitor - placed 06/19/20, I instructed patient to notify cardiologist office to inform them that monitor will have to be removed before surgery.  Cassie [name] will remove monitor Monday am.  Sleep Study - 2019- mild , patient did not want to try CPAP, she felt that sltudy may be inaccurate.  CPAP - no  LABS-CBC, BMP- was Hemolyzed , will do Stat on Day of surgery  ASA-no Eliquis - Hold 48 hours per [name] , NP ERAS-no. Clear liquids until 1145 per anesthesia orders.  HA1C-na Fasting Blood Sugar - na Checks Blood Sugar __0___ times a day  Anesthesia-  Pt denies having chest pain, sob, or fever at this time. All instructions explained to the pt, with a verbal understanding of the material. Pt agrees to go over the instructions while at home for a better understanding. Pt also instructed to self quarantine after being tested for COVID-19. The opportunity to ask questions was provided.

## 2020-06-26 NOTE — Telephone Encounter (Signed)
Spoke with patient.  Surgery canceled on 06/29/20 due to provider emergency.  Patient contacted hematology on 2/17, she restarted her Eliquis on 06/25/20. Advised patient to f/u with hematology if any questions regarding Eliquis.   Patient reports she has continued Aygestin 92m PO daily, reports scant amount of bleeding on 2/17 and 2/18, she will continue current dosage. Advised patient I will update Dr. SQuincy Simmondsand f/u if any additional recommendations.   Advised patient I will f/u with her next week to r/s surgery. Patient verbalizes understanding and is agreeable.

## 2020-06-26 NOTE — Progress Notes (Signed)
Anesthesia Chart Review:  Follows with cardiology for  history ofpalpitations with brief PSVT and PVCs, chest pain with 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  Heart monitor February 2021 showing predominantly sinus rhythm with an average heart rate of 54, PACs PVCs, proximal SVT with no sustained arrhythmia or prolonged pause.  Echo 08/27/2019 showed LVEF 60 to 21%, grade 1 diastolic dysfunction, mildly dilated left atrium. Last seen by Cadence Furth, 06/19/20 and noted to have increased palpitations at that time. 2 week heart monitor ordered.  She had stable dependent edema and exertional dyspnea that is felt to be multifactorial, significant contribution from deconditioning and morbid obesity.  Underwent similar procedure 1/94/1740 without complication.  Preop CBC unremarkable. BMP needs to be drawn DOS.  EKG 06/19/20: NSR. Rate 61.  CTA chest 06/10/2020: IMPRESSION: 1. No filling defect is identified in the pulmonary arterial tree to suggest pulmonary embolus. 2. Mild atherosclerotic calcification of the aortic arch and left anterior descending coronary artery. 3. Old granulomatous disease. 4. Aortic atherosclerosis.  TTE (08/16/2019):  1. Left ventricular ejection fraction, by estimation, is 60 to 65%. The  left ventricle has normal function. The left ventricle has no regional  wall motion abnormalities. Left ventricular diastolic parameters are  consistent with Grade I diastolic  dysfunction (impaired relaxation).  2. Right ventricular systolic function is normal. The right ventricular  size is normal. Tricuspid regurgitation signal is inadequate for assessing  PA pressure.  3. Left atrial size was mildly dilated.  14-day event monitor (06/02/19):  The patient was monitored for 13 days, 23 hours.  The predominant rhythm was sinus with an average rate of 54 bpm (range 43 to 102 bpm in sinus).  There were rare PACs and  PVCs.  A single 5 beat run of ventricular tachycardia occurred with a maximum rate of 150 bpm. This was immediately followed by a short episode of accelerated idioventricular rhythm.  19 episodes of PSVT occurred, lasting up to 19.3 seconds with a maximum rate of 150 bpm.  There was no sustained arrhythmia or prolonged pause.  Patient triggered events correspond to sinus rhythm, sinus rhythm with PACs, sinus rhythm with PVCs, PSVT, and NSVT/AIVR.  Predominantly sinus rhythm with rare PACs and PVCs as well as brief PSVT and a single 5 beat run of NSVT followed by transient accelerated idioventricular rhythm.  CT Coronary 11/25/16 with FFR: - Aorta: Normal size. No calcifications. No dissection. - Aortic Valve: Trileaflet. No calcifications. - Coronary Arteries: Normal coronary origin. Right dominance. - RCA is a large dominant artery that gives rise to PDA and PLVB. There is minimal calcified in RCA. PDA is of very small caliber. - Left main is a large long artery that gives rise to LAD and LCX arteries. There is no plaque in LM artery. - LAD is a long vessel that wraps around the apex and gives rise to one small diagonal branch. There is minimal calcified plaque in the proximal LAD associated with 0-25% stenosis. Mid LAD has a long 12 mm intramyocardial bridge. There is no plaque in the mid or distal LAD. Distal LAD gas fairly narrow lumen. - LCX is a small non-dominant artery that has no plaque. - Other findings: Normal pulmonary vein drainage into the left atrium. Normal let atrial appendage without a thrombus. Normal size of the pulmonary artery. IMPRESSION: 1. Coronary calcium score of 54. This was 58 percentile for age and sex matched control. 2. Normal coronary origin with right dominance.  3. Mild non-obstructive CAD in RCA and LAD. Mid LAD has a 12 mm intramyocardial bridge. We will send CT FFR analysis for evaluation of functional significance of the bridge. 4.  Normal size of the pulmonary artery. FFR: 1. LM: Normal FFR 2. LAD: Normal FFR in the proximal and mid LAD, FFR 0.8 in the very distal portion of LAD. 3. LCX: Normal FFR 4. RCA: Normal FFR IMPRESSION: 1. Borderline FFR 0.8 in the very distal LAD. - Reviewed by Dr. Saunders Revel on 11/27/16, "Please let Ms. Perleberg know that her chest CT does not show any significant atherosclerotic blockages. A myocardial bridge was noted, which represents a portion of her LAD that is sometimes compressed by the adjacent heart muscle. I am not sure that this alone would explain her shortness of breath. We will discuss this finding and medical treatment options when she returns to see me. In the meantime, I recommend that she continue her current medications."   Wynonia Musty [facility] Short Stay Center/Anesthesiology Phone [phone] 06/26/2020 2:55 PM

## 2020-06-26 NOTE — Anesthesia Preprocedure Evaluation (Deleted)
Anesthesia Evaluation    Airway        Dental   Pulmonary former smoker,           Cardiovascular      Neuro/Psych    GI/Hepatic   Endo/Other    Renal/GU      Musculoskeletal   Abdominal   Peds  Hematology   Anesthesia Other Findings   Reproductive/Obstetrics                             Anesthesia Physical Anesthesia Plan  ASA:   Anesthesia Plan:    Post-op Pain Management:    Induction:   PONV Risk Score and Plan:   Airway Management Planned:   Additional Equipment:   Intra-op Plan:   Post-operative Plan:   Informed Consent:   Plan Discussed with:   Anesthesia Plan Comments: (PAT note by Karoline Caldwell, PA-C: Follows with cardiology for  history ofpalpitations with brief PSVT and PVCs, chest pain with 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  Heart monitor February 2021 showing predominantly sinus rhythm with an average heart rate of 54, PACs PVCs, proximal SVT with no sustained arrhythmia or prolonged pause.  Echo 08/27/2019 showed LVEF 60 to 08%, grade 1 diastolic dysfunction, mildly dilated left atrium. Last seen by Cadence Furth, 06/19/20 and noted to have increased palpitations at that time. 2 week heart monitor ordered.  She had stable dependent edema and exertional dyspnea that is felt to be multifactorial, significant contribution from deconditioning and morbid obesity.  Underwent similar procedure 12/17/313 without complication.  Preop CBC unremarkable. BMP needs to be drawn DOS.  EKG 06/19/20: NSR. Rate 61.  CTA chest 06/10/2020: IMPRESSION: 1. No filling defect is identified in the pulmonary arterial tree to suggest pulmonary embolus. 2. Mild atherosclerotic calcification of the aortic arch and left anterior descending coronary artery. 3. Old granulomatous disease. 4. Aortic atherosclerosis.  TTE (08/16/2019):   1. Left ventricular ejection fraction, by estimation, is 60 to 65%. The  left ventricle has normal function. The left ventricle has no regional  wall motion abnormalities. Left ventricular diastolic parameters are  consistent with Grade I diastolic  dysfunction (impaired relaxation).  2. Right ventricular systolic function is normal. The right ventricular  size is normal. Tricuspid regurgitation signal is inadequate for assessing  PA pressure.  3. Left atrial size was mildly dilated.  14-day event monitor (06/02/19): The patient was monitored for 13 days, 23 hours. The predominant rhythm was sinus with an average rate of 54 bpm (range 43 to 102 bpm in sinus). There were rare PACs and PVCs. A single 5 beat run of ventricular tachycardia occurred with a maximum rate of 150 bpm. This was immediately followed by a short episode of accelerated idioventricular rhythm. 19 episodes of PSVT occurred, lasting up to 19.3 seconds with a maximum rate of 150 bpm. There was no sustained arrhythmia or prolonged pause. Patient triggered events correspond to sinus rhythm, sinus rhythm with PACs, sinus rhythm with PVCs, PSVT, and NSVT/AIVR.  Predominantly sinus rhythm with rare PACs and PVCs as well as brief PSVT and a single 5 beat run of NSVT followed by transient accelerated idioventricular rhythm.  CT Coronary 11/25/16 with FFR: - Aorta: Normal size. No calcifications. No dissection. - Aortic Valve: Trileaflet. No calcifications. - Coronary Arteries: Normal coronary origin. Right dominance. - RCA is a large dominant artery that gives rise to PDA and PLVB. There  is minimal calcified in RCA. PDA is of very small caliber. - Left main is a large long artery that gives rise to LAD and LCX arteries. There is no plaque in LM artery. - LAD is a long vessel that wraps around the apex and gives rise to one small diagonal branch. There is minimal calcified plaque in the proximal LAD associated with  0-25% stenosis. Mid LAD has a long 12 mm intramyocardial bridge. There is no plaque in the mid or distal LAD. Distal LAD gas fairly narrow lumen. - LCX is a small non-dominant artery that has no plaque. - Other findings: Normal pulmonary vein drainage into the left atrium. Normal let atrial appendage without a thrombus. Normal size of the pulmonary artery. IMPRESSION: 1. Coronary calcium score of 54. This was 28 percentile for age and sex matched control. 2. Normal coronary origin with right dominance. 3. Mild non-obstructive CAD in RCA and LAD. Mid LAD has a 12 mm intramyocardial bridge. We will send CT FFR analysis for evaluation of functional significance of the bridge. 4. Normal size of the pulmonary artery. FFR: 1. LM: Normal FFR 2. LAD: Normal FFR in the proximal and mid LAD, FFR 0.8 in the very distal portion of LAD. 3. LCX: Normal FFR 4. RCA: Normal FFR IMPRESSION: 1. Borderline FFR 0.8 in the very distal LAD. - Reviewed by Dr. Saunders Revel on 11/27/16, "Please let Ms. Gens know that her chest CT does not show any significant atherosclerotic blockages. A myocardial bridge was noted, which represents a portion of her LAD that is sometimes compressed by the adjacent heart muscle. I am not sure that this alone would explain her shortness of breath. We will discuss this finding and medical treatment options when she returns to see me. In the meantime, I recommend that she continue her current medications."  )        Anesthesia Quick Evaluation

## 2020-06-29 NOTE — Telephone Encounter (Signed)
Ok to continue Aygestin 5 mg daily.

## 2020-06-30 NOTE — Telephone Encounter (Signed)
See open telephone encounter dated 06/24/20.

## 2020-07-04 ENCOUNTER — Encounter: Payer: Self-pay | Admitting: Obstetrics and Gynecology

## 2020-07-06 ENCOUNTER — Encounter: Payer: Self-pay | Admitting: Obstetrics and Gynecology

## 2020-07-06 ENCOUNTER — Other Ambulatory Visit: Payer: Self-pay

## 2020-07-06 ENCOUNTER — Encounter: Payer: Self-pay | Admitting: Family

## 2020-07-06 ENCOUNTER — Telehealth: Payer: Self-pay | Admitting: Internal Medicine

## 2020-07-06 ENCOUNTER — Telehealth: Payer: Self-pay | Admitting: Obstetrics and Gynecology

## 2020-07-06 DIAGNOSIS — N950 Postmenopausal bleeding: Secondary | ICD-10-CM

## 2020-07-06 NOTE — Telephone Encounter (Signed)
Patient has been having off and on bleeding since November and her obgyn is aware of these issues and is on medication for it and she is experiencing more blood than usually. Her obgyn has been out of the office for the past two weeks. She does not really like the current office she is at because they are not helping her and she doesn't know what she should do. She wanted to know if she should be referred to somewhere else. Her hematology doctor took her off of eliquis because of all the bleeding she is having. I wasn't sure how to schedule this or what we needed to do.

## 2020-07-06 NOTE — Telephone Encounter (Signed)
MyChart message sent to patient.

## 2020-07-06 NOTE — Telephone Encounter (Signed)
Please call the patient. With the bleeding she describes she should come in for a nurse visit for vitals and a CBC and ferritin.  She has multiple medical issues which make major surgery risky for her. If she wants another opinion, please see if you can set her up with Dr Berline Lopes in [place].

## 2020-07-06 NOTE — Telephone Encounter (Signed)
See below

## 2020-07-07 ENCOUNTER — Other Ambulatory Visit: Payer: Self-pay

## 2020-07-07 ENCOUNTER — Ambulatory Visit: Payer: 59 | Admitting: *Deleted

## 2020-07-07 ENCOUNTER — Other Ambulatory Visit (INDEPENDENT_AMBULATORY_CARE_PROVIDER_SITE_OTHER): Payer: 59

## 2020-07-07 VITALS — BP 140/80

## 2020-07-07 DIAGNOSIS — N950 Postmenopausal bleeding: Secondary | ICD-10-CM

## 2020-07-07 DIAGNOSIS — R030 Elevated blood-pressure reading, without diagnosis of hypertension: Secondary | ICD-10-CM

## 2020-07-07 NOTE — Telephone Encounter (Signed)
Spoke with patient while in office.  Pads provided to patient, advised to monitor bleeding and pad changes.  Our office will call with lab results once completed.  ER precautions reviewed for heavy bleeding, changing saturated pad q1-2hrs.  Will review potential surgery dates with Dr. Quincy Simmonds when she returns and call to discuss.  Will need to schedule at [facility]. Patient verbalizes understanding and is agreeable.   Routing to Dr. Rosann Auerbach.

## 2020-07-07 NOTE — Telephone Encounter (Signed)
Call to patient to f/u on surgery scheduling and in f/u to MyChart messages. Patient states she has been advised by hematology to stop Eliquis until bleeding stops, may need hysterectomy.  Has not taken Eliquis since 07/05/20, A.M. Patient express Patient reports no significant bleeding for several days after OV on 06/25/20 with Dr. Quincy Simmonds.  Bleeding restarted again several days ago, flow is intermittent, color goes from brown to red. She is using toilet paper in place of pads. Wakes up q2hrs through the night to check bleeding. Reports fatigue and states "occasional lightheadedness, not uncommon for me with change of weather". Reports intermittent cramps. Reports several small clots. She is still taking Aygestin 69m PO daily as previously advised. Has stopped taking OTC Tylenol for cramping, not effective.   Reviewed surgery dates, patient declines to schedule hysteroscopy/D&C/ Mirena IUD insertion at this time. Patient request to discuss concerns regarding Mirena IUD. Patient is aware Dr. SQuincy Simmondsis out of the office due to family emergency.   Nurse visit scheduled for today at 2pm for vital signs and labs -CBC w/diff and ferritin. Advised patient I will update Dr. JTalbert Nanand f/u with any additional recommendations. Advised patient Dr. JTalbert Nanis in the OR, response may not be immediate. Patient verbalizes understanding.   Length of call 45 minutes.   Dr. JTalbert Nan-please review and advise.

## 2020-07-07 NOTE — Telephone Encounter (Signed)
You do not need a referral for ob/gyn. If she is unhappy with her current ob/gyn she can establish with a new one.

## 2020-07-07 NOTE — Telephone Encounter (Signed)
Called patient.  See open telephone encounter dated 06/24/20.  Encounter closed.

## 2020-07-07 NOTE — Telephone Encounter (Signed)
Patient was notified via [employer].

## 2020-07-08 ENCOUNTER — Encounter: Payer: Self-pay | Admitting: Family

## 2020-07-08 ENCOUNTER — Encounter: Payer: Self-pay | Admitting: Obstetrics and Gynecology

## 2020-07-08 ENCOUNTER — Encounter: Payer: Self-pay | Admitting: Obstetrics & Gynecology

## 2020-07-08 ENCOUNTER — Ambulatory Visit: Payer: 59 | Admitting: Obstetrics & Gynecology

## 2020-07-08 VITALS — BP 126/82

## 2020-07-08 DIAGNOSIS — N950 Postmenopausal bleeding: Secondary | ICD-10-CM

## 2020-07-08 DIAGNOSIS — Z86711 Personal history of pulmonary embolism: Secondary | ICD-10-CM | POA: Diagnosis not present

## 2020-07-08 LAB — CBC WITH DIFFERENTIAL/PLATELET
Absolute Monocytes: 670 cells/uL (ref 200–950)
Basophils Absolute: 17 cells/uL (ref 0–200)
Basophils Relative: 0.2 %
Eosinophils Absolute: 131 cells/uL (ref 15–500)
Eosinophils Relative: 1.5 %
HCT: 41.7 % (ref 35.0–45.0)
Hemoglobin: 13.8 g/dL (ref 11.7–15.5)
Lymphs Abs: 1844 cells/uL (ref 850–3900)
MCH: 31.2 pg (ref 27.0–33.0)
MCHC: 33.1 g/dL (ref 32.0–36.0)
MCV: 94.3 fL (ref 80.0–100.0)
MPV: 10.6 fL (ref 7.5–12.5)
Monocytes Relative: 7.7 %
Neutro Abs: 6038 cells/uL (ref 1500–7800)
Neutrophils Relative %: 69.4 %
Platelets: 273 10*3/uL (ref 140–400)
RBC: 4.42 10*6/uL (ref 3.80–5.10)
RDW: 12.2 % (ref 11.0–15.0)
Total Lymphocyte: 21.2 %
WBC: 8.7 10*3/uL (ref 3.8–10.8)

## 2020-07-08 LAB — FERRITIN: Ferritin: 24 ng/mL (ref 16–288)

## 2020-07-08 NOTE — Telephone Encounter (Signed)
Patient called back to relay to [facility] her bleeding overnight, wearing pads changing every 2 hours overnight and early this am. Reports the bleeding is still heavy. will route to [facility]

## 2020-07-08 NOTE — Telephone Encounter (Signed)
Called patient. OV scheduled for today, 07/08/20.  See telephone encounter dated 06/24/20.  Encounter closed.

## 2020-07-08 NOTE — Telephone Encounter (Addendum)
Spoke with patient, advised of 07/07/20 results per Dr. Talbert Nan.  Reviewed surgery date of 07/27/20 at [place] due to BMI, patient declined. Patient is requesting earlier surgery date. Advised patient Dr. Quincy Simmonds is out of the office, can review additional surgery dates when she returns on 3/7, patient declined.   Patient reports changing saturated pad q2 hours overnight, requesting OV.  OV scheduled for today at 2pm with on call provider, Dr. Dellis Filbert.   Routing to Dr. Ileene Musa.   Cc: Dr. Luis Abed, Cristopher [place] to [name], MD      07/08/20 9:17 AM Hi Cassie [name], I left a message for you to call me and I'm sending this also.I used all the large pads you gave me yesterday including more that I had.I went through each pad in 2 hrs or less, they were completely saturated,edge to edge. I had huge blood clots all bigger than a quarter,  and more than one at each change. Two were especially huge, one by 5 inches I would guess. And another one by at least 6. Each throughout the night was an inch wide and varied in length. I am still having the heavy bleeding right now. I can not see how I can make it until Monday, to talk with Dr Quincy Simmonds, and especially no way of making it until possibly the 15th for a procedure. I'm not sure how to move forward with this.The bleeding is taking a physical and mental toll on me. I am not able to sleep at all and haven't in about 4 nights. I really need to get this bleeding issue stopped, before I lose too much blood.I called the ER early this morning, they said I should come in, but I wanted to talk with you first.I am not sure what the ER could do? I really need help, Cassie [name].  I'm trying not to be a bother. I know with Dr Quincy Simmonds out that you all are very busy. I cannot foresee going through another day with this bleeding.I did see my labs looked good, which I am thankful for. But I need to get this bleeding stopped. Please call as early as possible. I will probably go  to the ER at sometime today. I don't know any other option, although I don't know what they can do for me.Thanks Cassie [name]

## 2020-07-08 NOTE — Progress Notes (Signed)
    Cassie [name] [place] May 16, 1960 [id number]        60 y.o.  G0P0000   RP: PMB with clots  HPI: Patient of Dr Quincy Simmonds who is planning a surgery for her in 08/2020.  H/O Endometrial Simple Hyperplasia on Provera 5 mg PO daily.  Per patient, heavier bleeding x a few days with occasional blood clots.  H/O PE.  Patient stopped her Eliquis per recommendation of her cardiologist because of the bleeding. D+C patho benign 12/2019.   OB History  Gravida Para Term Preterm AB Living  0 0 0 0 0 0  SAB IAB Ectopic Multiple Live Births  0 0 0 0 0    Past medical history,surgical history, problem list, medications, allergies, family history and social history were all reviewed and documented in the EPIC chart.   Directed ROS with pertinent positives and negatives documented in the history of present illness/assessment and plan.  Exam:  Vitals:   07/08/20 1356  BP: 126/82   General appearance:  Normal  Abdomen: Normal  Gynecologic exam: Vulva normal.  Speculum:  Cervix/Vagina normal.  Very mild dark blood present, no active bleeding.  Bimanual exam:  AV uterus, normal volume, mobile, NT.  No adnexal mass.  CBC Hb 13.8 on 07/07/2020.  Pelvic US 05/21/2020:Uterus no myometrial masses.  EMS 9.12 mm.  Ovaries normal and poor visualization due to [facility].  No adnexal masses.  No free fluid.    Assessment/Plan:  60 y.o. G0P0000   1. Post-menopausal bleeding Patient reassured that her bleeding is not currently heavy and that her Hb is good at 13.8 on 07/07/2020.  May increase Provera to 5 mg PO BID as needed. F/U with Dr Quincy Simmonds for [place] visit next week.  2. History of pulmonary embolism Stopped Eliquis.  To restart per Cardiologist.  Princess Bruins MD, 2:42 PM 07/08/2020

## 2020-07-09 NOTE — Telephone Encounter (Signed)
Spoke with patient while in office.  Surgery posted for 07/27/20 at [facility].  Dr. Dellis Filbert recommends f/u with Dr. Quincy Simmonds next week. Advised patient I will review schedule with Dr. Quincy Simmonds once she returns on 3/7 and return call. Patient agreeable.

## 2020-07-13 ENCOUNTER — Encounter: Payer: Self-pay | Admitting: Obstetrics & Gynecology

## 2020-07-13 NOTE — Telephone Encounter (Signed)
Spoke with patient. Patient reports intermittent vaginal bleeding, denies heavy bleeding. Reports intermittent mild cramps. Restarted Eliquis on 07/10/20, patient states she was advised by hematology ok to restart, not to take when bleeding heavy. Patient is still taking Aygestin 32m BID.   Advised patient I will update Dr. SQuincy Simmonds OV scheduled for 3/8 at 11:15am, arriving at 11am. Advised patient Dr. SQuincy Simmondswill be coming from the OR, our office will notify if any delay. Patient verbalizes understanding and is agreeable.   Routing to provider for final review. Patient is agreeable to disposition. Will close encounter.   Routing to provider for final review. Patient is agreeable to disposition. Will close encounter.

## 2020-07-14 ENCOUNTER — Encounter: Payer: Self-pay | Admitting: Obstetrics and Gynecology

## 2020-07-14 ENCOUNTER — Other Ambulatory Visit: Payer: Self-pay

## 2020-07-14 ENCOUNTER — Ambulatory Visit: Payer: 59 | Admitting: Obstetrics and Gynecology

## 2020-07-14 VITALS — BP 130/78

## 2020-07-14 DIAGNOSIS — Z8742 Personal history of other diseases of the female genital tract: Secondary | ICD-10-CM

## 2020-07-14 DIAGNOSIS — N950 Postmenopausal bleeding: Secondary | ICD-10-CM

## 2020-07-14 NOTE — Progress Notes (Signed)
GYNECOLOGY  VISIT   HPI: 60 y.o.   Married  Caucasian  female   G0P0000 with Patient's last menstrual period was 05/09/2010 (approximate).   here for Pre-op  DILATATION AND CURETTAGE . Surgery was cancelled and rescheduled due to provider family emergency.   Husband is present for the visit today.   Patient seen by Dr. Dellis Filbert last week for heavy bleeding in my absence from the office.  Hgb 14.8 on 07/07/20.   Bleeding is  now much more light.   She is on Aygestin 5 mg po bid.   Patient is taking Eliquis for hx PE.  She received a recommendation from her oncologist to stop this medication if she were having heavy bleeding.  She has been taking it again for the last 5 - 6 days.  She was told by her oncologist to have a hysterectomy so the bleeding will not be an ongoing issue which impacts her ability to be on Eliquis.   Patient expresses frustration and concern that she has ongoing periodic clotting and that she really would like to have a hysterectomy. She has some reservations about Mirena IUD and ongoing bleeding while on anticoagulation.  GYNECOLOGIC HISTORY: Patient's last menstrual period was 05/09/2010 (approximate). Contraception:  postmenopausal Menopausal hormone therapy:  Ayestin Last mammogram:  06/14/2018 Last pap smear:06/20/2019         OB History    Gravida  0   Para  0   Term  0   Preterm  0   AB  0   Living  0     SAB  0   IAB  0   Ectopic  0   Multiple  0   Live Births  0              Patient Active Problem List   Diagnosis Date Noted   Leg swelling 02/27/2020   Hyperlipidemia 02/27/2020   Head pain 10/25/2019   Elevated LFTs 10/25/2019   Aortic atherosclerosis ([place]) 08/22/2019   NSVT (nonsustained ventricular tachycardia) ([place]) 07/10/2019   Right lower quadrant abdominal pain 07/08/2019   Simple endometrial hyperplasia 06/23/2019   Postmenopausal bleeding 04/03/2019   Cough variant asthma vs UACS from gerd 01/01/2018    PAC (premature atrial contraction) 08/17/2017   PVC (premature ventricular contraction) 08/17/2017   PSVT (paroxysmal supraventricular tachycardia) ([place]) 08/17/2017   Leg edema 05/19/2017   Other fatigue 12/10/2016   Myocardial bridge 12/10/2016   Coronary artery calcification 12/10/2016   History of pulmonary embolism 12/10/2016   GERD (gastroesophageal reflux disease) 07/12/2016   Shortness of breath 05/31/2016   Chronic venous insufficiency 05/31/2016   UC (ulcerative colitis) ([place]) 01/28/2015   Routine general medical examination at a health care facility 06/27/2014   Left medial knee pain 07/17/2013   Adrenal incidentaloma ([place]) 03/25/2013   Allergic rhinitis 09/13/2011   B12 deficiency 11/28/2008   Vitamin D deficiency 06/30/2008   Palpitations 06/30/2008   Morbid obesity ([place]) 10/15/2007   Anxiety about health 08/23/2007   Cardiac dysrhythmia 08/23/2007    Past Medical History:  Diagnosis Date   Adrenal nodule ([place])    Allergy    Anemia    in her early twenties   Anxiety    Arthritis    knees,thumbs   Cardiac arrhythmia    palpitations,PVC'sAC   Cataract    bilateral removed    Complication of anesthesia    BP dropped with a colonoscopy in [place]. no problems since.   Cyst of kidney, acquired  cyst right kidney- benign   Degenerative disc disease, lumbar    Dyspnea    Edema    Gallstones    History of anemia    20 yrs. ago not now- pt denies    History of hiatal hernia    Morbid obesity ([place])    Osteoarthritis    Other allergy, other than to medicinal agents    Panic disorder    Pulmonary embolism ([place])    Run of atrial premature complexes    Ulcerative colitis, unspecified    Vitamin B12 deficiency    Vitamin D deficiency     Past Surgical History:  Procedure Laterality Date   BREAST BIOPSY  11/2011   Left- benign   CATARACT EXTRACTION Right    CATARACT EXTRACTION Left    CHOLECYSTECTOMY N/A  01/17/2014   Procedure: LAPAROSCOPIC CHOLECYSTECTOMY;  Surgeon: Excell Seltzer, MD;  Location: [facility];  Service: General;  Laterality: N/A;   COLONOSCOPY     DILATATION & CURETTAGE/HYSTEROSCOPY WITH MYOSURE N/A 12/23/2019   Procedure: DILATATION & CURETTAGE, HYSTEROSCOPY WITH MYOSURE, RESECTION OF POLYPOID TISSUE;  Surgeon: Megan Salon, MD;  Location: [facility];  Service: Gynecology;  Laterality: N/A;   RETINAL LASER PROCEDURE Left    SIGMOIDOSCOPY      Current Outpatient Medications  Medication Sig Dispense Refill   acetaminophen (TYLENOL) 500 MG tablet Take 1,000 mg by mouth every 6 (six) hours as needed for moderate pain or headache.     ALPRAZolam (XANAX) 1 MG tablet TAKE 1/2 TO 1 TABLET BY MOUTH 2 TIMES DAILY AS NEEDED FOR ANXIETY. (Patient taking differently: Take 0.5-1 mg by mouth See admin instructions. Take 0.5 tablet (0.5 mg) by mouth scheduled in the morning, may take an additional 0.5 tablet (0.5 mg) in the afternoon if needed for anxiety & take 1 tablet (1 mg) by mouth scheduled at bedtime.) 60 tablet 2   atenolol (TENORMIN) 25 MG tablet Take 12.5-25 mg by mouth See admin instructions. Take 1 tablet (25 mg) by mouth in the morning & take 0.5 tablet (12.5 mg) by mouth in the evening     atenolol (TENORMIN) 50 MG tablet Take 25 mg in the am and 12.5 mg in the pm.     ELIQUIS 5 MG TABS tablet TAKE 1 TABLET BY MOUTH TWICE DAILY 60 tablet 6   norethindrone (AYGESTIN) 5 MG tablet TAKE 2 TABLETS(10 MG) BY MOUTH DAILY (Patient taking differently: Take 5 mg by mouth in the morning, at noon, and at bedtime.) 60 tablet 5   Probiotic Product (PROBIOTIC-10 PO) Take 2 tablets by mouth daily.     Simethicone (GAS-X PO) Take 2 tablets by mouth 4 (four) times daily as needed (For gas.). Do not exceed 6 tablets in a 24 hour period.     No current facility-administered medications for this visit.     ALLERGIES: Doxycycline, Hydrocodone, Sudafed [pseudoephedrine hcl], Chlorhexidine  gluconate, Acular [ketorolac tromethamine], Codeine, Diphenhydramine hcl, Lactose intolerance (gi), Mesalamine, Sulfasalazine, Azithromycin, Caffeine, Penicillins, and Prednisone  Family History  Problem Relation Age of Onset   Heart disease Father 62       pacemaker   Atrial fibrillation Father    Prostate cancer Father    Hypertension Maternal Aunt    Endometrial cancer Maternal Aunt    Hypertension Maternal Grandfather    Colon cancer Neg Hx    Stomach cancer Neg Hx    Colon polyps Neg Hx    Esophageal cancer Neg Hx    Rectal cancer  Neg Hx    Ovarian cancer Neg Hx    Pancreatic cancer Neg Hx    Breast cancer Neg Hx     Social History   Socioeconomic History   Marital status: Married    Spouse name: Not on file   Number of children: 0   Years of education: Not on file   Highest education level: Not on file  Occupational History   Occupation: Housewife    Employer: UNEMPLOYED  Tobacco Use   Smoking status: Former Smoker    Packs/day: 2.00    Years: 5.00    Pack years: 10.00    Types: Cigarettes    Quit date: 03/07/1979    Years since quitting: 41.3   Smokeless tobacco: Never Used  Scientific laboratory technician Use: Never used  Substance and Sexual Activity   Alcohol use: No    Alcohol/week: 0.0 standard drinks   Drug use: No   Sexual activity: Not Currently    Partners: Male    Birth control/protection: Post-menopausal  Other Topics Concern   Not on file  Social History Narrative   Married: 0 kids   Regular exercise: walks 2 times a week   Caffeine use: none   Social Determinants of Radio broadcast assistant Strain: Not on file  Food Insecurity: Not on file  Transportation Needs: Not on file  Physical Activity: Not on file  Stress: Not on file  Social Connections: Not on file  Intimate Partner Violence: Not on file    Review of Systems  See HPI.  PHYSICAL EXAMINATION:    BP 130/78 (BP Location: Right Arm, Patient Position:  Sitting, Cuff Size: Large)    LMP 05/09/2010 (Approximate)     General appearance: alert, cooperative and appears stated age.  Sitting in wheel chair.  Head: Normocephalic, without obvious abnormality, atraumatic Lungs: clear to auscultation bilaterally Heart: regular rate and rhythm Extremities: LE with mild edema.  Pelvic: Deferred.  ASSESSMENT  Postmenopausal bleeding.  Hx simple endometrial hyperplasia.  On Eliquis for hx PE.  Hx cardiac arrhythmias.   PLAN  Discussion of hysteroscopy with Myosure polypectomy, dilation and curettage, and placement of Mirena IUD, at least as a temporizing measure for treatment of her bleeding.   Risks, benefits, and alternatives reviewed. Risks include but are not limited to bleeding, infection, damage to surrounding organs including uterine perforation requiring hospitalization and laparoscopy, pulmonary edema, reaction to anesthesia, DVT, PE, death, need for further treatment, potential expulsion of the IUD and surgical removal of the IUD have been discussed.  Surgical expectations and recovery discussed.  Patient wishes to proceed with the above surgical plan.  Her oncology team will be contacted regarding perioperative Eliquis.  I will plan for Lovenox DVT prophylaxis on the day of surgery.

## 2020-07-15 ENCOUNTER — Telehealth: Payer: Self-pay | Admitting: *Deleted

## 2020-07-15 ENCOUNTER — Ambulatory Visit: Payer: Self-pay | Admitting: Obstetrics and Gynecology

## 2020-07-15 NOTE — Telephone Encounter (Signed)
I have reviewed the recommendations for the Eliquis discontinuation and re-initiation.   Please convey this to the patient.   Please also confirm with her her location when we did her telephone visit on 06/17/20.  I need to add this to the documentation of this visit.

## 2020-07-15 NOTE — Telephone Encounter (Signed)
Dr. Quincy Simmonds -please review recommendations as seen below.     Bartko, Rebekah [place]field, RN  Burnice Logan, RN Good morning!   Patient can stop Eliquis 2 days prior to procedure and restart the day after per Laverna Peace NP. Let me know if any further questions. Have a good day.         Previous Messages   ----- Message -----  From: Burnice Logan, RN  Sent: 07/14/2020  4:39 PM EST  To: Rico Ala, RN, Riley Lam   Good afternoon. This patient has been rescheduled for surgery on 07/27/20. She is scheduled for Hysteroscopy D&C with Mirena IUD insertion with Dr. Quincy Simmonds. Please advise on Eliquis pre-op and post-op and any additional recommendations for anticoagulation therapy pre or post op.   Thank you!  Glorianne Manchester, RN

## 2020-07-16 NOTE — Telephone Encounter (Signed)
She needs an office visit if she develops vaginal discharge with odor, itching or burning.

## 2020-07-16 NOTE — Telephone Encounter (Signed)
Spoke with patient.   1.Surgery date request confirmed.  Advised surgery is scheduled for 07/28/20, [facility], 1145.  Surgery instruction sheet and hospital brochure reviewed, printed copy will be mailed.  Patient advised of Covid screening and quarantine requirements and agreeable.   2. Reviewed Eliquis recommendations as seen below per Hematology. Patient verbalizes understanding and is agreeable.   3. Patient was in her home during 06/17/20 telephone visit.   4. Patient reports no vaginal bleeding since OV on 3/8. Reports milky, light yellow vaginal discharge for 5 days. Denies odor or vaginal itching. Wants Dr. Quincy Simmonds to be aware. States this was her "normal" vaginal discharge for many years before bleeding started. Advised patient I will update Dr. Quincy Simmonds and f/u with recommendations. Patient agreeable.   Dr. Quincy Simmonds -please review and advise on vaginal discharge.   Cc: Hayley Carder

## 2020-07-16 NOTE — Telephone Encounter (Signed)
Addendum to previous entry: Surgery is scheduled for 07/27/20.  Call returned to patient to confirm.

## 2020-07-17 NOTE — Telephone Encounter (Signed)
Spoke with patient, advised as seen below per Dr. Quincy Simmonds.  Patient verbalizes understanding and is agreeable.  Encounter closed.

## 2020-07-23 ENCOUNTER — Other Ambulatory Visit (HOSPITAL_COMMUNITY)
Admission: RE | Admit: 2020-07-23 | Discharge: 2020-07-23 | Disposition: A | Discharge status: Home / Self Care | Payer: 59 | Source: Ambulatory Visit | Attending: Obstetrics and Gynecology | Admitting: Obstetrics and Gynecology

## 2020-07-23 ENCOUNTER — Telehealth: Payer: Self-pay | Admitting: *Deleted

## 2020-07-23 DIAGNOSIS — Z01812 Encounter for preprocedural laboratory examination: Secondary | ICD-10-CM | POA: Diagnosis not present

## 2020-07-23 DIAGNOSIS — Z20822 Contact with and (suspected) exposure to covid-19: Secondary | ICD-10-CM | POA: Diagnosis not present

## 2020-07-23 LAB — SARS CORONAVIRUS 2 (TAT 6-24 HRS): SARS Coronavirus 2: NEGATIVE

## 2020-07-23 NOTE — Telephone Encounter (Signed)
Spoke with patient. Advised as seen below. Number provider to [facility] PAT, patient will contact PAT directly if no return call today.   Patient wants Dr. Quincy Simmonds to know she reports no vaginal bleeding, is experiencing increase in cramping at night.  Patient is aware to return call if any additional concerns or does not hear back from PAT.   Routing to provider for final review. Patient is agreeable to disposition. Will close encounter.

## 2020-07-23 NOTE — Telephone Encounter (Signed)
Left message for [facility].  Patient scheduled for surgery on 07/27/20, she has additional questions regarding arrival time and if another PAT appt is needed prior to surgery. Please contact patient directly at [phone]. Return call to [place], [place] at [facility] [phone] if any additional questions.

## 2020-07-23 NOTE — Telephone Encounter (Signed)
-----   Message from [place] sent at 07/22/2020  3:04 PM EDT ----- Regarding: Surgery Pre-Op Testing/ Surgery Arrival Time [facility],  I spoke with Cassie [name] regarding her benefits and she asked about pre-op testing at the hospital. She said she had it last time she was scheduled for surgery but had not heard anything this time. She was unsure if that is because her last testing was not long ago or if it had fallen through the cracks.  She also wanted to verify what time she needs to be at the hospital. She said that the hospital told her 2 hours before her surgery time and that we had told her an hour and a half.  Do you mind calling her to clarify this for her?  Thanks! Hayley

## 2020-07-24 ENCOUNTER — Other Ambulatory Visit (HOSPITAL_COMMUNITY): Payer: 59

## 2020-07-24 ENCOUNTER — Encounter (HOSPITAL_COMMUNITY): Payer: Self-pay | Admitting: Obstetrics and Gynecology

## 2020-07-24 NOTE — Progress Notes (Signed)
Patient denies shortness of breath, fever, cough or chest pain.  PCP - Dr Pricilla Holm Cardiologist - Dr Harrell Gave End   Chest x-ray - N/A; CT chest 06/10/20 EKG - 06/19/20 Stress Test - 01/01/04 ECHO - 08/16/19 Cardiac Cath - n/a  Sleep Study -  Yes 09/2017 Mild CPAP - None  Blood Thinner Instructions:  Follow your surgeon's instructions on when to stop eliquis prior to surgery.  Last dose 07/24/20.  ERAS:Clear liquids til 8:45 am day of surgery  Anesthesia review:Yes - Dr Lissa Hoard  STOP now taking any Aspirin (unless otherwise instructed by your surgeon), Aleve, Naproxen, Ibuprofen, Motrin, Advil, Goody's, BC's, all herbal medications, fish oil, and all vitamins.   Coronavirus Screening Covid test on 07/24/20 was negative.  Patient verbalized understanding of instructions that were given via phone.

## 2020-07-27 ENCOUNTER — Ambulatory Visit (HOSPITAL_COMMUNITY)
Admission: RE | Admit: 2020-07-27 | Discharge: 2020-07-27 | Disposition: A | Discharge status: Home / Self Care | Payer: 59 | Attending: Obstetrics and Gynecology | Admitting: Obstetrics and Gynecology

## 2020-07-27 ENCOUNTER — Ambulatory Visit (HOSPITAL_COMMUNITY): Payer: 59 | Admitting: Certified Registered Nurse Anesthetist

## 2020-07-27 ENCOUNTER — Encounter (HOSPITAL_COMMUNITY)
Admission: RE | Disposition: A | Discharge status: Home / Self Care | Payer: Self-pay | Source: Home / Self Care | Attending: Obstetrics and Gynecology

## 2020-07-27 ENCOUNTER — Other Ambulatory Visit: Payer: Self-pay

## 2020-07-27 DIAGNOSIS — Z86711 Personal history of pulmonary embolism: Secondary | ICD-10-CM | POA: Insufficient documentation

## 2020-07-27 DIAGNOSIS — Z888 Allergy status to other drugs, medicaments and biological substances status: Secondary | ICD-10-CM | POA: Insufficient documentation

## 2020-07-27 DIAGNOSIS — E785 Hyperlipidemia, unspecified: Secondary | ICD-10-CM | POA: Insufficient documentation

## 2020-07-27 DIAGNOSIS — Z880 Allergy status to penicillin: Secondary | ICD-10-CM | POA: Diagnosis not present

## 2020-07-27 DIAGNOSIS — Z8742 Personal history of other diseases of the female genital tract: Secondary | ICD-10-CM | POA: Diagnosis not present

## 2020-07-27 DIAGNOSIS — Z881 Allergy status to other antibiotic agents status: Secondary | ICD-10-CM | POA: Diagnosis not present

## 2020-07-27 DIAGNOSIS — Z8249 Family history of ischemic heart disease and other diseases of the circulatory system: Secondary | ICD-10-CM | POA: Diagnosis not present

## 2020-07-27 DIAGNOSIS — Z7901 Long term (current) use of anticoagulants: Secondary | ICD-10-CM | POA: Insufficient documentation

## 2020-07-27 DIAGNOSIS — Z87891 Personal history of nicotine dependence: Secondary | ICD-10-CM | POA: Diagnosis not present

## 2020-07-27 DIAGNOSIS — Z8042 Family history of malignant neoplasm of prostate: Secondary | ICD-10-CM | POA: Insufficient documentation

## 2020-07-27 DIAGNOSIS — N950 Postmenopausal bleeding: Secondary | ICD-10-CM

## 2020-07-27 DIAGNOSIS — Z8049 Family history of malignant neoplasm of other genital organs: Secondary | ICD-10-CM | POA: Diagnosis not present

## 2020-07-27 DIAGNOSIS — N8500 Endometrial hyperplasia, unspecified: Secondary | ICD-10-CM | POA: Diagnosis not present

## 2020-07-27 DIAGNOSIS — Z885 Allergy status to narcotic agent status: Secondary | ICD-10-CM | POA: Insufficient documentation

## 2020-07-27 DIAGNOSIS — Z6841 Body Mass Index (BMI) 40.0 and over, adult: Secondary | ICD-10-CM | POA: Insufficient documentation

## 2020-07-27 DIAGNOSIS — Z9049 Acquired absence of other specified parts of digestive tract: Secondary | ICD-10-CM | POA: Insufficient documentation

## 2020-07-27 DIAGNOSIS — Z79899 Other long term (current) drug therapy: Secondary | ICD-10-CM | POA: Diagnosis not present

## 2020-07-27 DIAGNOSIS — Z30430 Encounter for insertion of intrauterine contraceptive device: Secondary | ICD-10-CM | POA: Insufficient documentation

## 2020-07-27 HISTORY — DX: Cardiac arrhythmia, unspecified: I49.9

## 2020-07-27 HISTORY — PX: HYSTEROSCOPY WITH D & C: SHX1775

## 2020-07-27 HISTORY — DX: Dependence on wheelchair: Z99.3

## 2020-07-27 HISTORY — DX: Sleep apnea, unspecified: G47.30

## 2020-07-27 HISTORY — PX: INTRAUTERINE DEVICE (IUD) INSERTION: SHX5877

## 2020-07-27 LAB — CBC
HCT: 43.9 % (ref 36.0–46.0)
Hemoglobin: 14.2 g/dL (ref 12.0–15.0)
MCH: 31.3 pg (ref 26.0–34.0)
MCHC: 32.3 g/dL (ref 30.0–36.0)
MCV: 96.9 fL (ref 80.0–100.0)
Platelets: 329 10*3/uL (ref 150–400)
RBC: 4.53 MIL/uL (ref 3.87–5.11)
RDW: 13.9 % (ref 11.5–15.5)
WBC: 7.9 10*3/uL (ref 4.0–10.5)
nRBC: 0 % (ref 0.0–0.2)

## 2020-07-27 LAB — ABO/RH: ABO/RH(D): A POS

## 2020-07-27 LAB — BASIC METABOLIC PANEL
Anion gap: 9 (ref 5–15)
BUN: 5 mg/dL — ABNORMAL LOW (ref 8–23)
CO2: 23 mmol/L (ref 22–32)
Calcium: 8.7 mg/dL — ABNORMAL LOW (ref 8.9–10.3)
Chloride: 103 mmol/L (ref 98–111)
Creatinine, Ser: 0.92 mg/dL (ref 0.44–1.00)
GFR, Estimated: >60 mL/min (ref >60)
Glucose, Bld: 100 mg/dL — ABNORMAL HIGH (ref 70–99)
Potassium: 4.2 mmol/L (ref 3.5–5.1)
Sodium: 135 mmol/L (ref 135–145)

## 2020-07-27 LAB — TYPE AND SCREEN
ABO/RH(D): A POS
Antibody Screen: NEGATIVE

## 2020-07-27 SURGERY — DILATATION AND CURETTAGE /HYSTEROSCOPY
Anesthesia: General | Site: Uterus

## 2020-07-27 MED ORDER — LIDOCAINE HCL (CARDIAC) PF 100 MG/5ML IV SOSY
PREFILLED_SYRINGE | INTRAVENOUS | Status: DC | PRN
  Administered 2020-07-27: 80 mg via INTRATRACHEAL

## 2020-07-27 MED ORDER — LACTATED RINGERS IV SOLN: INTRAVENOUS | Status: DC

## 2020-07-27 MED ORDER — LIDOCAINE HCL (PF) 1 % IJ SOLN
INTRAMUSCULAR | Status: AC
  Filled 2020-07-27: qty 30

## 2020-07-27 MED ORDER — MIDAZOLAM HCL 2 MG/2ML IJ SOLN
INTRAMUSCULAR | Status: AC
  Filled 2020-07-27: qty 2

## 2020-07-27 MED ORDER — ONDANSETRON HCL 4 MG/2ML IJ SOLN
INTRAMUSCULAR | Status: DC | PRN
  Administered 2020-07-27: 4 mg via INTRAVENOUS

## 2020-07-27 MED ORDER — GLYCOPYRROLATE 0.2 MG/ML IJ SOLN
INTRAMUSCULAR | Status: DC | PRN
  Administered 2020-07-27 (×2): .1 mg via INTRAVENOUS

## 2020-07-27 MED ORDER — FENTANYL CITRATE (PF) 250 MCG/5ML IJ SOLN
INTRAMUSCULAR | Status: DC | PRN
  Administered 2020-07-27 (×2): 50 ug via INTRAVENOUS

## 2020-07-27 MED ORDER — DEXAMETHASONE SODIUM PHOSPHATE 10 MG/ML IJ SOLN
INTRAMUSCULAR | Status: DC | PRN
  Administered 2020-07-27: 10 mg via INTRAVENOUS

## 2020-07-27 MED ORDER — GLYCOPYRROLATE PF 0.2 MG/ML IJ SOSY
PREFILLED_SYRINGE | INTRAMUSCULAR | Status: AC
  Filled 2020-07-27: qty 1

## 2020-07-27 MED ORDER — MIDAZOLAM HCL 2 MG/2ML IJ SOLN
INTRAMUSCULAR | Status: DC | PRN
  Administered 2020-07-27: 2 mg via INTRAVENOUS

## 2020-07-27 MED ORDER — FENTANYL CITRATE (PF) 250 MCG/5ML IJ SOLN
INTRAMUSCULAR | Status: AC
  Filled 2020-07-27: qty 5

## 2020-07-27 MED ORDER — LIDOCAINE 2% (20 MG/ML) 5 ML SYRINGE
INTRAMUSCULAR | Status: AC
  Filled 2020-07-27: qty 5

## 2020-07-27 MED ORDER — 0.9 % SODIUM CHLORIDE (POUR BTL) OPTIME
TOPICAL | Status: DC | PRN
  Administered 2020-07-27: 1000 mL

## 2020-07-27 MED ORDER — DEXAMETHASONE SODIUM PHOSPHATE 10 MG/ML IJ SOLN
INTRAMUSCULAR | Status: AC
  Filled 2020-07-27: qty 1

## 2020-07-27 MED ORDER — PROPOFOL 10 MG/ML IV BOLUS
INTRAVENOUS | Status: DC | PRN
  Administered 2020-07-27: 150 mg via INTRAVENOUS

## 2020-07-27 MED ORDER — POVIDONE-IODINE 10 % EX SWAB
2.0000 "application " | Freq: Once | CUTANEOUS | Status: AC
  Administered 2020-07-27: 2 via TOPICAL

## 2020-07-27 MED ORDER — LEVONORGESTREL 20 MCG/24HR IU IUD
INTRAUTERINE_SYSTEM | INTRAUTERINE | Status: AC
  Administered 2020-07-27: 13:00:00 52 mg via INTRAUTERINE
  Filled 2020-07-27: qty 1

## 2020-07-27 MED ORDER — LIDOCAINE HCL 1 % IJ SOLN
INTRAMUSCULAR | Status: DC | PRN
  Administered 2020-07-27: 10 mL

## 2020-07-27 MED ORDER — SODIUM CHLORIDE 0.9 % IR SOLN
Status: DC | PRN
  Administered 2020-07-27: 3000 mL

## 2020-07-27 MED ORDER — ONDANSETRON HCL 4 MG/2ML IJ SOLN
INTRAMUSCULAR | Status: AC
  Filled 2020-07-27: qty 2

## 2020-07-27 MED ORDER — CHLORHEXIDINE GLUCONATE 0.12 % MT SOLN: 15.0000 mL | Freq: Once | OROMUCOSAL | Status: AC

## 2020-07-27 MED ORDER — ENOXAPARIN SODIUM 40 MG/0.4ML ~~LOC~~ SOLN
40.0000 mg | SUBCUTANEOUS | Status: AC
  Administered 2020-07-27: 40 mg via SUBCUTANEOUS
  Filled 2020-07-27: qty 0.4

## 2020-07-27 MED ORDER — ORAL CARE MOUTH RINSE
15.0000 mL | Freq: Once | OROMUCOSAL | Status: AC
  Administered 2020-07-27: 15 mL via OROMUCOSAL

## 2020-07-27 SURGICAL SUPPLY — 12 items
CATH ROBINSON RED A/P 16FR (CATHETERS) ×2 IMPLANT
GLOVE BIO SURGEON STRL SZ 6.5 (GLOVE) ×2 IMPLANT
GLOVE SURG UNDER POLY LF SZ7 (GLOVE) ×2 IMPLANT
GOWN STRL REUS W/ TWL LRG LVL3 (GOWN DISPOSABLE) ×2 IMPLANT
GOWN STRL REUS W/TWL LRG LVL3 (GOWN DISPOSABLE) ×4
KIT PROCEDURE FLUENT (KITS) ×2 IMPLANT
MIRENA ×2 IMPLANT
PACK VAGINAL MINOR WOMEN LF (CUSTOM PROCEDURE TRAY) ×2 IMPLANT
PAD OB MATERNITY 4.3X12.25 (PERSONAL CARE ITEMS) ×2 IMPLANT
SUT VIC AB 2-0 CT1 27 (SUTURE) ×2
SUT VIC AB 2-0 CT1 TAPERPNT 27 (SUTURE) ×1 IMPLANT
TOWEL GREEN STERILE FF (TOWEL DISPOSABLE) ×4 IMPLANT

## 2020-07-27 NOTE — H&P (Signed)
Office Visit  07/14/2020 [facility], MD  Obstetrics and Gynecology  Post-menopausal bleeding +1 more  Dx  Pre-op Exam ; Referred by Hoyt Koch, MD  Reason for Visit    Additional Documentation  Vitals:  BP 130/78 (BP Location: Right Arm, Patient Position: Sitting, Cuff Size: Large)  LMP 05/09/2010 (Approximate)  Flowsheets:  MEWS Score,  NEWS,  Method of Visit    Encounter Info:  Billing Info,  History,  Allergies,  Detailed Report     Orthostatic Vitals Recorded in This Encounter   07/14/2020  1120     Patient Position: Sitting  BP Location: Right Arm  Cuff Size: Large   All Notes    Progress Notes by [name], MD at 07/14/2020 11:15 AM  Author: [name], MD Author Type: Physician Filed: 07/17/2020  6:51 PM  Note Status: Signed Cosign: Cosign Not Required Encounter Date: 07/14/2020  Editor: [name], MD (Physician)      Prior Versions: 1. Enrigue Catena, RMA (Scientist, research (life sciences)) at 07/14/2020 11:22 AM - Sign when Signing Visit    GYNECOLOGY  VISIT   HPI: 60 y.o.   Married  Caucasian  female   [id number] with Patient's last menstrual period was 05/09/2010 (approximate).   here for Pre-op  DILATATION AND CURETTAGE . Surgery was cancelled and rescheduled due to provider family emergency.    Husband is present for the visit today.    Patient seen by Dr. Dellis Filbert last week for heavy bleeding in my absence from the office.  Hgb 14.8 on 07/07/20.    Bleeding is  now much more light.   She is on Aygestin 5 mg po bid.    Patient is taking Eliquis for hx PE.  She received a recommendation from her oncologist to stop this medication if she were having heavy bleeding.  She has been taking it again for the last 5 - 6 days.  She was told by her oncologist to have a hysterectomy so the bleeding will not be an ongoing issue which impacts her ability  to be on Eliquis.    Patient expresses frustration and concern that she has ongoing periodic clotting and that she really would like to have a hysterectomy. She has some reservations about Mirena IUD and ongoing bleeding while on anticoagulation.   GYNECOLOGIC HISTORY: Patient's last menstrual period was 05/09/2010 (approximate). Contraception:  postmenopausal Menopausal hormone therapy:  Ayestin Last mammogram:  06/14/2018 Last pap smear:06/20/2019                 OB History     Gravida  0   Para  0   Term  0   Preterm  0   AB  0   Living  0      SAB  0   IAB  0   Ectopic  0   Multiple  0   Live Births  0                     Patient Active Problem List    Diagnosis Date Noted  . Leg swelling 02/27/2020  . Hyperlipidemia 02/27/2020  . Head pain 10/25/2019  . Elevated LFTs 10/25/2019  . Aortic atherosclerosis ([place]) 08/22/2019  . NSVT (nonsustained ventricular tachycardia) ([place]) 07/10/2019  . Right lower quadrant abdominal pain 07/08/2019  . Simple endometrial hyperplasia 06/23/2019  . Postmenopausal bleeding 04/03/2019  .  Cough variant asthma vs UACS from gerd 01/01/2018  . PAC (premature atrial contraction) 08/17/2017  . PVC (premature ventricular contraction) 08/17/2017  . PSVT (paroxysmal supraventricular tachycardia) ([place]) 08/17/2017  . Leg edema 05/19/2017  . Other fatigue 12/10/2016  . Myocardial bridge 12/10/2016  . Coronary artery calcification 12/10/2016  . History of pulmonary embolism 12/10/2016  . GERD (gastroesophageal reflux disease) 07/12/2016  . Shortness of breath 05/31/2016  . Chronic venous insufficiency 05/31/2016  . UC (ulcerative colitis) ([place]) 01/28/2015  . Routine general medical examination at a health care facility 06/27/2014  . Left medial knee pain 07/17/2013  . Adrenal incidentaloma ([place]) 03/25/2013  . Allergic rhinitis 09/13/2011  . B12 deficiency 11/28/2008  . Vitamin D deficiency 06/30/2008  . Palpitations 06/30/2008   . Morbid obesity ([place]) 10/15/2007  . Anxiety about health 08/23/2007  . Cardiac dysrhythmia 08/23/2007          Past Medical History:  Diagnosis Date  . Adrenal nodule ([place])    . Allergy    . Anemia      in her early twenties  . Anxiety    . Arthritis      knees,thumbs  . Cardiac arrhythmia      palpitations,PVC'sAC  . Cataract      bilateral removed   . Complication of anesthesia      BP dropped with a colonoscopy in [place]. no problems since.  . Cyst of kidney, acquired      cyst right kidney- benign  . Degenerative disc disease, lumbar    . Dyspnea    . Edema    . Gallstones    . History of anemia      20 yrs. ago not now- pt denies   . History of hiatal hernia    . Morbid obesity ([place])    . Osteoarthritis    . Other allergy, other than to medicinal agents    . Panic disorder    . Pulmonary embolism ([place])    . Run of atrial premature complexes    . Ulcerative colitis, unspecified    . Vitamin B12 deficiency    . Vitamin D deficiency             Past Surgical History:  Procedure Laterality Date  . BREAST BIOPSY   11/2011    Left- benign  . CATARACT EXTRACTION Right    . CATARACT EXTRACTION Left    . CHOLECYSTECTOMY N/A 01/17/2014    Procedure: LAPAROSCOPIC CHOLECYSTECTOMY;  Surgeon: Excell Seltzer, MD;  Location: [facility];  Service: General;  Laterality: N/A;  . COLONOSCOPY      . DILATATION & CURETTAGE/HYSTEROSCOPY WITH MYOSURE N/A 12/23/2019    Procedure: DILATATION & CURETTAGE, HYSTEROSCOPY WITH MYOSURE, RESECTION OF POLYPOID TISSUE;  Surgeon: Megan Salon, MD;  Location: [place];  Service: Gynecology;  Laterality: N/A;  . RETINAL LASER PROCEDURE Left    . SIGMOIDOSCOPY                Current Outpatient Medications  Medication Sig Dispense Refill  . acetaminophen (TYLENOL) 500 MG tablet Take 1,000 mg by mouth every 6 (six) hours as needed for moderate pain or headache.      . ALPRAZolam (XANAX) 1 MG tablet TAKE 1/2 TO 1 TABLET BY MOUTH 2 TIMES DAILY AS  NEEDED FOR ANXIETY. (Patient taking differently: Take 0.5-1 mg by mouth See admin instructions. Take 0.5 tablet (0.5 mg) by mouth scheduled in the morning, may take an additional 0.5 tablet (0.5 mg) in  the afternoon if needed for anxiety & take 1 tablet (1 mg) by mouth scheduled at bedtime.) 60 tablet 2  . atenolol (TENORMIN) 25 MG tablet Take 12.5-25 mg by mouth See admin instructions. Take 1 tablet (25 mg) by mouth in the morning & take 0.5 tablet (12.5 mg) by mouth in the evening      . atenolol (TENORMIN) 50 MG tablet Take 25 mg in the am and 12.5 mg in the pm.      . ELIQUIS 5 MG TABS tablet TAKE 1 TABLET BY MOUTH TWICE DAILY 60 tablet 6  . norethindrone (AYGESTIN) 5 MG tablet TAKE 2 TABLETS(10 MG) BY MOUTH DAILY (Patient taking differently: Take 5 mg by mouth in the morning, at noon, and at bedtime.) 60 tablet 5  . Probiotic Product (PROBIOTIC-10 PO) Take 2 tablets by mouth daily.      . Simethicone (GAS-X PO) Take 2 tablets by mouth 4 (four) times daily as needed (For gas.). Do not exceed 6 tablets in a 24 hour period.        No current facility-administered medications for this visit.      ALLERGIES: Doxycycline, Hydrocodone, Sudafed [pseudoephedrine hcl], Chlorhexidine gluconate, Acular [ketorolac tromethamine], Codeine, Diphenhydramine hcl, Lactose intolerance (gi), Mesalamine, Sulfasalazine, Azithromycin, Caffeine, Penicillins, and Prednisone        Family History  Problem Relation Age of Onset  . Heart disease Father 35        pacemaker  . Atrial fibrillation Father    . Prostate cancer Father    . Hypertension Maternal Aunt    . Endometrial cancer Maternal Aunt    . Hypertension Maternal Grandfather    . Colon cancer Neg Hx    . Stomach cancer Neg Hx    . Colon polyps Neg Hx    . Esophageal cancer Neg Hx    . Rectal cancer Neg Hx    . Ovarian cancer Neg Hx    . Pancreatic cancer Neg Hx    . Breast cancer Neg Hx        Social History         Socioeconomic History  .  Marital status: Married      Spouse name: Not on file  . Number of children: 0  . Years of education: Not on file  . Highest education level: Not on file  Occupational History  . Occupation: Tax inspector: UNEMPLOYED  Tobacco Use  . Smoking status: Former Smoker      Packs/day: 2.00      Years: 5.00      Pack years: 10.00      Types: Cigarettes      Quit date: 03/07/1979      Years since quitting: 41.3  . Smokeless tobacco: Never Used  Vaping Use  . Vaping Use: Never used  Substance and Sexual Activity  . Alcohol use: No      Alcohol/week: 0.0 standard drinks  . Drug use: No  . Sexual activity: Not Currently      Partners: Male      Birth control/protection: Post-menopausal  Other Topics Concern  . Not on file  Social History Narrative    Married: 0 kids    Regular exercise: walks 2 times a week    Caffeine use: none    Social Determinants of Health    Financial Resource Strain: Not on file  Food Insecurity: Not on file  Transportation Needs: Not on file  Physical Activity: Not  on file  Stress: Not on file  Social Connections: Not on file  Intimate Partner Violence: Not on file      Review of Systems  See HPI.   PHYSICAL EXAMINATION:     BP 130/78 (BP Location: Right Arm, Patient Position: Sitting, Cuff Size: Large)   LMP 05/09/2010 (Approximate)     General appearance: alert, cooperative and appears stated age.  Sitting in wheel chair.  Head: Normocephalic, without obvious abnormality, atraumatic Lungs: clear to auscultation bilaterally Heart: regular rate and rhythm Extremities: LE with mild edema.   Pelvic: Deferred.   ASSESSMENT   Postmenopausal bleeding.  Hx simple endometrial hyperplasia.  On Eliquis for hx PE.  Hx cardiac arrhythmias.    PLAN   Discussion of hysteroscopy with Myosure polypectomy, dilation and curettage, and placement of Mirena IUD, at least as a temporizing measure for treatment of her bleeding.   Risks, benefits,  and alternatives reviewed. Risks include but are not limited to bleeding, infection, damage to surrounding organs including uterine perforation requiring hospitalization and laparoscopy, pulmonary edema, reaction to anesthesia, DVT, PE, death, need for further treatment, potential expulsion of the IUD and surgical removal of the IUD have been discussed.  Surgical expectations and recovery discussed.  Patient wishes to proceed with the above surgical plan.  Her oncology team will be contacted regarding perioperative Eliquis.  I will plan for Lovenox DVT prophylaxis on the day of surgery.

## 2020-07-27 NOTE — Op Note (Signed)
OPERATIVE REPORT  PREOPERATIVE DIAGNOSES:   Postmenopausal bleeding, history of simple endometrial hyperplasia, endometrial thickening.  POSTOPERATIVE DIAGNOSES:   Postmenopausal bleeding, history of simple endometrial hyperplasia, endometrial thickening.  PROCEDURE:  Hysteroscopy with dilation and curettage and Myosure resection of endometrial masses, placement of Mirena IUD.    SURGEON:  Lenard Galloway, MD  ANESTHESIA:  LMA, paracervical block with 10 mL of 1% lidocaine.  IV FLUIDS:  300 cc LR  EBL:  10 cc  URINE OUTPUT:   50 cc  NORMAL SALINE DEFICIT:   604 cc NS  COMPLICATIONS:  None.  INDICATIONS FOR THE PROCEDURE:     The patient is a 60 year old G1 Caucasian female with postmenopausal bleeding and a history of prior simple endometrial hyperplasia who presents with recurrent bleeding.  The patient underwent a hysteroscopy with dilation and curettage with another provider in August, 2021, and her pathology report at that time indicated microglandular adenosis with squamous metaplasia and benign lower uterine segment.  The patient was placed on Aygestin and she developed cramping and then recurrent bleeding.  Pelvic ultrasound in the office showed a uterus with no myometrial masses, an endometrial stripe of 9.12 mm, and normal ovaries.  She was referred to GYN Oncology for consultation as the patient was requesting hysterectomy and has multiple medical issues including a history of pulmonary embolus while on hormone replacement therapy and a history of cardiac arrhythmias.  A recommendation was made for endometrial sampling and placement of a Mirena IUD.  The patient was unable to tolerate office examination, and a plan was made to proceed with a hysteroscopy with dilation and curettage after risks, benefits and alternatives were reviewed.  FINDINGS:  Exam under anesthesia revealed a small anteverted, mobile uterus.  No adnexal masses were noted.  The uterus was sounded to 7 cm.   Hysteroscopy showed generalized thickened endometrium and no specific lesions.  The left tubal ostia was well visualized and the right tubal ostia region was seen but was some what obscured by thickened endometrium.  Endometrial currettings were mild in amount.   Mirena IUD lot number [id number], expiration April 2024 was placed at the time of surgery.    SPECIMENS:   Endometrial curettings were sent to pathology.   PROCEDURE IN DETAIL:  The patient was reidentified in the preoperative hold area.  She received Lovenox and PAS stockings for DVT prophylaxis.  In the operating room, the patient was placed in the dorsal lithotomy position after an LMA anesthetic was introduced.    An exam under anesthesia was performed.  The patient's lower abdomen, vagina and perineum were sterilely prepped with Betadine and the  patient's bladder was catheterized of urine.  She was sterilly draped.  An exam under anesthesia was performed.  A speculum was placed inside the vagina and a single-tooth tenaculum was placed on the anterior cervical lip.  A paracervical block was performed with a total of 10 mL of 1% lidocaine plain.  The uterus was sounded. The cervix was dilated to a #19 Pratt dilator.  The diagnostic hysteroscope was then inserted inside the uterine cavity under the continuous infusion of normal saline solution.   Findings are as noted above.   The hysteroscope was removed and the cervix was further dilated to a #23 Pratt dilator.   The serrated and then sharp curettes were introduced into the uterine cavity and the endometrium was curetted in all 4 quadrants.   The endometrial curettings were sent to Pathology.  The Mirena  IUD was then placed without difficulty and the strings were trimmed to 3.5 cm length.   The single-tooth tenaculum which had been placed on the anterior cervical lip was removed.     Hemostasis was good, and all of the vaginal instruments were removed.  The patient was  awakened and escorted to the recovery room in stable condition after she was cleansed of Betadine.   There were no complications to the procedure.   All needle, instrument and sponge counts were correct.  Lenard Galloway, MD

## 2020-07-27 NOTE — Transfer of Care (Signed)
Immediate Anesthesia Transfer of Care Note  Patient: Cassie [name]  Procedure(s) Performed: DILATATION AND CURETTAGE /HYSTEROSCOPY (N/A Uterus) INTRAUTERINE DEVICE (IUD) INSERTION MIRENA (N/A Uterus)  Patient Location: PACU  Anesthesia Type:General  Level of Consciousness: awake and patient cooperative  Airway & Oxygen Therapy: Patient Spontanous Breathing and Patient connected to face mask  Post-op Assessment: Report given to RN and Post -op Vital signs reviewed and stable  Post vital signs: Reviewed and stable  Last Vitals:  Vitals Value Taken Time  BP    Temp    Pulse 79 07/27/20 1333  Resp 17 07/27/20 1333  SpO2 100 % 07/27/20 1333  Vitals shown include unvalidated device data.  Last Pain:  Vitals:   07/27/20 1107  TempSrc:   PainSc: 2          Complications: No complications documented.

## 2020-07-27 NOTE — Anesthesia Procedure Notes (Signed)
Procedure Name: LMA Insertion Date/Time: 07/27/2020 12:27 PM Performed by: Kathryne Hitch, CRNA Pre-anesthesia Checklist: Patient identified, Emergency Drugs available, Suction available, Patient being monitored and Timeout performed Patient Re-evaluated:Patient Re-evaluated prior to induction Oxygen Delivery Method: Circle system utilized Preoxygenation: Pre-oxygenation with 100% oxygen Induction Type: IV induction LMA: LMA inserted LMA Size: 5.0 Tube secured with: Tape Comments: Performed by Dario Guardian

## 2020-07-27 NOTE — Discharge Instructions (Signed)
Hi Cassie [name],   When I placed the hysteroscope inside your uterus, I saw generalized thickening of your uterine lining. I did the curettage procedure and placed the Mirena IUD.  Everything went well today.   Please call if you need anything.   Josefa Half, MD  Intrauterine Device Insertion, Care After This sheet gives you information about how to care for yourself after your procedure. Your health care provider may also give you more specific instructions. If you have problems or questions, contact your health care provider. What can I expect after the procedure? After the procedure, it is common to have:  Cramps and pain in the abdomen.  Bleeding. It may be light or heavy. This may last for a few days.  Lower back pain.  Dizziness.  Headaches.  Nausea. Follow these instructions at home:  Before resuming sexual activity, check to make sure that you can feel the IUD string or strings. You should be able to feel the end of the string below the opening of your cervix. If your IUD string is in place, you may resume sexual activity. ? If you had a hormonal IUD inserted more than 7 days after your most recent period started, you will need to use a backup method of birth control for 7 days after IUD insertion. Ask your health care provider whether this applies to you.  Continue to check that the IUD is still in place by feeling for the strings after every menstrual period, or once a month.  An IUD will not protect you from sexually transmitted infections (STIs). Use methods to prevent the exchange of body fluids between partners (barrier protection) every time you have sex. Barrier protection can be used during oral, vaginal, or anal sex. Commonly used barrier methods include: ? Female condom. ? Female condom. ? Dental dam.  Take over-the-counter and prescription medicines only as told by your health care provider.  Keep all follow-up visits as told by your health care provider. This is  important.   Contact a health care provider if:  You feel light-headed or weak.  You have any of the following problems with your IUD string or strings: ? The string bothers or hurts you or your sexual partner. ? You cannot feel the string. ? The string has gotten longer.  You can feel the IUD in your vagina.  You think you may be pregnant, or you miss your menstrual period.  You think you may have a sexually transmitted infection (STI). Get help right away if:  You have flu-like symptoms, such as tiredness (fatigue) and muscle aches.  You have a fever and chills.  You have bleeding that is heavier or lasts longer than a normal menstrual cycle.  You have abnormal or bad-smelling discharge from your vagina.  You develop abdominal pain that is new, is getting worse, or is not in the same area of earlier cramping and pain.  You have pain during sexual activity. Summary  After the procedure, it is common to have cramps and pain in the abdomen. It is also common to have light bleeding or heavier bleeding that is like your menstrual period.  Continue to check that the IUD is still in place by feeling for the strings after every menstrual period, or once a month.  Keep all follow-up visits as told by your health care provider. This is important.  Contact your health care provider if you have problems with your IUD strings, such as the string getting longer or bothering  you or your sexual partner. This information is not intended to replace advice given to you by your health care provider. Make sure you discuss any questions you have with your health care provider. Document Revised: 04/16/2019 Document Reviewed: 04/16/2019 Elsevier Patient Education  2021 [employer]. Dilation and Curettage or Vacuum Curettage, Care After This sheet gives you information about how to care for yourself after your procedure. Your doctor may also give you more specific instructions. If you have  problems or questions, contact your doctor. What can I expect after the procedure? After the procedure, it is common to have:  Mild pain or cramping.  Some bleeding or spotting from the vagina. These may last for up to 2 weeks. Follow these instructions at home: Medicines  Take over-the-counter and prescription medicines only as told by your doctor. This is very important if you take blood-thinning medicine.  Ask your doctor if the medicine prescribed to you requires you to avoid driving or using machinery. Activity  If you were given a medicine to help you relax (sedative) during your procedure, it can affect you for many hours. Do not drive or use machinery until your doctor says that it is safe.  Rest as told by your doctor.  Do not sit for a long time without moving. Get up to take short walks every 1-2 hours. This is important. Ask for help if you feel weak or unsteady.  Do not lift anything that is heavier than 10 lb (4.5 kg), or the limit that you are told, until your doctor says that it is safe.  Return to your normal activities as told by your doctor. Ask your doctor what activities are safe for you.   Lifestyle For at least 2 weeks, or as long as told by your doctor:  Do not douche.  Do not use tampons.  Do not have sex. General instructions  Wear compression stockings as told by your doctor.  It is up to you to get the results of your procedure. Ask your doctor, or the department that is doing the procedure, when your results will be ready.  Keep all follow-up visits as told by your doctor. This is important. Contact a doctor if:  You have very bad cramps that get worse or do not get better with medicine.  You have very bad pain in your belly (abdomen).  You cannot drink fluids without vomiting.  You have pain in a different part of your pelvis. The pelvis is the area just above your thighs.  You have fluid from your vagina that smells bad.  You have a  rash. Get help right away if:  You are bleeding a lot from your vagina. A lot of bleeding means soaking more than one sanitary pad in 1 hour for 2 hours in a row.  You have a fever that is above 100.75F (38.0C).  Your belly feels very tender or hard.  You have chest pain.  You have trouble breathing.  You feel dizzy.  You feel light-headed.  You pass out (faint).  You have pain in your neck or shoulder area. These symptoms may be an emergency. Do not wait to see if the symptoms will go away. Get medical help right away. Call your local emergency services (911 in the [place] Do not drive yourself to the hospital. Summary  After your procedure, it is common to have pain or cramping. It is also common to have bleeding or spotting from your vagina.  Rest as  told. Do not sit for a long time without moving. Get up to take short walks every 1-2 hours.  Do not lift anything that is heavier than 10 lb (4.5 kg), or the limit that you are told.  Contact your doctor if you have fluid from your vagina that smells bad.  Get help right away if you develop any problems from the procedure. Ask your doctor what problems to watch for. This information is not intended to replace advice given to you by your health care provider. Make sure you discuss any questions you have with your health care provider. Document Revised: 05/28/2019 Document Reviewed: 05/28/2019 Elsevier Patient Education  2021 [employer]. Hysteroscopy Hysteroscopy is a procedure used to look inside a woman's womb (uterus). This may be done for various reasons, including:  To look for tumors and other growths in the uterus.  To evaluate abnormal bleeding, fibroid tumors, polyps, scar tissue, or uterine cancer.  To determine why a woman is unable to get pregnant or has had repeated pregnancy losses.  To locate an IUD (intrauterine device).  To place a birth control device into the fallopian tubes. During this procedure, a  thin, flexible tube with a small light and camera (hysteroscope) is used to examine the uterus. The camera sends images to a monitor in the room so that your health care provider can view the inside of your uterus. A hysteroscopy should be done right after a menstrual period. Tell a health care provider about:  Any allergies you have.  All medicines you are taking, including vitamins, herbs, eye drops, creams, and over-the-counter medicines.  Any problems you or family members have had with anesthetic medicines.  Any blood disorders you have.  Any surgeries you have had.  Any medical conditions you have.  Whether you are pregnant or may be pregnant.  Whether you have been diagnosed with an STI (sexually transmitted infection) or you think you have an STI. What are the risks? Generally, this is a safe procedure. However, problems may occur, including:  Excessive bleeding.  Infection.  Damage to the uterus or other structures or organs.  Allergic reaction to medicines or fluids that are used in the procedure. What happens before the procedure? Staying hydrated Follow instructions from your health care provider about hydration, which may include:  Up to 2 hours before the procedure - you may continue to drink clear liquids, such as water, clear fruit juice, black coffee, and plain tea. Eating and drinking restrictions Follow instructions from your health care provider about eating and drinking, which may include:  8 hours before the procedure - stop eating solid foods and drink clear liquids only.  2 hours before the procedure - stop drinking clear liquids. Medicines  Ask your health care provider about: ? Changing or stopping your regular medicines. This is especially important if you are taking diabetes medicines or blood thinners. ? Taking medicines such as aspirin and ibuprofen. These medicines can thin your blood. Do not take these medicines unless your health care  provider tells you to take them. ? Taking over-the-counter medicines, vitamins, herbs, and supplements.  Medicine may be placed in your cervix the day before the procedure. This medicine causes the cervix to open (dilate). The larger opening makes it easier for the hysteroscope to be inserted into the uterus during the procedure. General instructions  Ask your health care provider: ? What steps will be taken to help prevent infection. These steps may include:  Washing skin with a  germ-killing soap.  Taking antibiotic medicine.  Do not use any products that contain nicotine or tobacco for at least 4 weeks before the procedure. These products include cigarettes, chewing tobacco, and vaping devices, such as e-cigarettes. If you need help quitting, ask your health care provider.  Plan to have a responsible adult take you home from the hospital or clinic.  Plan to have a responsible adult care for you for the time you are told after you leave the hospital or clinic. This is important.  Empty your bladder before the procedure begins. What happens during the procedure?  An IV will be inserted into one of your veins.  You may be given: ? A medicine to help you relax (sedative). ? A medicine that numbs the area around the cervix (local anesthetic). ? A medicine to make you fall asleep (general anesthetic).  A hysteroscope will be inserted through your vagina and into your uterus.  Air or fluid will be used to enlarge your uterus to allow your health care provider to see it better. The amount of fluid used will be carefully checked throughout the procedure.  In some cases, tissue may be gently scraped from inside the uterus and sent to a lab for testing (biopsy). The procedure may vary among health care providers and hospitals. What can I expect after the procedure?  Your blood pressure, heart rate, breathing rate, and blood oxygen level will be monitored until you leave the hospital or  clinic.  You may have cramps. You may be given medicines for this.  You may have bleeding, which may vary from light spotting to menstrual-like bleeding. This is normal.  If you had a biopsy, it is up to you to get the results. Ask your health care provider, or the department that is doing the procedure, when your results will be ready. Follow these instructions at home: Activity  Rest as told by your health care provider.  Return to your normal activities as told by your health care provider. Ask your health care provider what activities are safe for you.  If you were given a sedative during the procedure, it can affect you for several hours. Do not drive or operate machinery until your health care provider says that it is safe. Medicines  Do not take aspirin or other NSAIDs during recovery, as told by your healthcare provider. It can increase the risk of bleeding.  Ask your health care provider if the medicine prescribed to you: ? Requires you to avoid driving or using machinery. ? Can cause constipation. You may need to take these actions to prevent or treat constipation:  Drink enough fluid to keep your urine pale yellow.  Take over-the-counter or prescription medicines.  Eat foods that are high in fiber, such as beans, whole grains, and fresh fruits and vegetables.  Limit foods that are high in fat and processed sugars, such as fried or sweet foods. General instructions  Do not douche, use tampons, or have sex for 2 weeks after the procedure, or until your health care provider approves.  Do not take baths, swim, or use a hot tub until your health care provider approves. Take showers instead of baths for 2 weeks, or for as long as told by your health care provider.  Keep all follow-up visits. This is important. Contact a health care provider if:  You feel dizzy or lightheaded.  You feel nauseous.  You have abnormal vaginal discharge.  You have a rash.  You have pain  that does not get better with medicine.  You have chills. Get help right away if:  You have bleeding that is heavier than a normal menstrual period.  You have a fever.  You have pain or cramps that get worse.  You develop new abdominal pain.  You faint.  You have pain in your shoulder.  You are short of breath. Summary  Hysteroscopy is a procedure that is used to look inside a woman's womb (uterus).  After the procedure, you may have bleeding, which varies from light spotting to menstrual-like bleeding. This is normal. You may also have cramps.  Do not douche, use tampons, or have sex for 2 weeks after the procedure, or until your health care provider approves.  Plan to have a responsible adult take you home from the hospital or clinic. This information is not intended to replace advice given to you by your health care provider. Make sure you discuss any questions you have with your health care provider. Document Revised: 12/11/2019 Document Reviewed: 12/11/2019 Elsevier Patient Education  2021 [employer].

## 2020-07-27 NOTE — Progress Notes (Signed)
Update to History and Physical  Patient states no additional bleeding.  Has yellow discharge and no other symptoms.  Patient examined.  OK to proceed with surgery.

## 2020-07-27 NOTE — Anesthesia Preprocedure Evaluation (Addendum)
Anesthesia Evaluation  Patient identified by MRN, date of birth, ID band Patient awake    Reviewed: Allergy & Precautions, NPO status , Patient's Chart, lab work & pertinent test results  Airway Mallampati: II  TM Distance: >3 FB Neck ROM: Full    Dental  (+) Chipped,    Pulmonary asthma , sleep apnea , former smoker,    Pulmonary exam normal        Cardiovascular + CAD  + dysrhythmias  Rhythm:Regular Rate:Normal     Neuro/Psych  Headaches, Anxiety    GI/Hepatic hiatal hernia, PUD, GERD  Medicated,  Endo/Other  negative endocrine ROS  Renal/GU Renal disease  negative genitourinary   Musculoskeletal  (+) Arthritis , Osteoarthritis,    Abdominal (+) + obese,  Abdomen: soft. Bowel sounds: normal.  Peds  Hematology  (+) anemia ,   Anesthesia Other Findings   Reproductive/Obstetrics                           Anesthesia Physical Anesthesia Plan  ASA: III  Anesthesia Plan: General   Post-op Pain Management:    Induction: Intravenous  PONV Risk Score and Plan: 3 and Ondansetron, Dexamethasone, Midazolam and Treatment may vary due to age or medical condition  Airway Management Planned: Mask and LMA  Additional Equipment: None  Intra-op Plan:   Post-operative Plan: Extubation in OR  Informed Consent: I have reviewed the patients History and Physical, chart, labs and discussed the procedure including the risks, benefits and alternatives for the proposed anesthesia with the patient or authorized representative who has indicated his/her understanding and acceptance.     Dental advisory given  Plan Discussed with: CRNA  Anesthesia Plan Comments: (ECHO 04/21: IMPRESSIONS  1. Left ventricular ejection fraction, by estimation, is 60 to 65%. The  left ventricle has normal function. The left ventricle has no regional  wall motion abnormalities. Left ventricular diastolic parameters are   consistent with Grade I diastolic  dysfunction (impaired relaxation).  2. Right ventricular systolic function is normal. The right ventricular  size is normal. Tricuspid regurgitation signal is inadequate for assessing  PA pressure.  3. Left atrial size was mildly dilated.   Lab Results      Component                Value               Date                      WBC                      8.7                 07/07/2020                HGB                      13.8                07/07/2020                HCT                      41.7                07/07/2020  MCV                      94.3                07/07/2020                PLT                      273                 07/07/2020            Lab Results      Component                Value               Date                      NA                       136                 05/28/2020                K                        3.6                 05/28/2020                CO2                      25                  05/28/2020                GLUCOSE                  132 (H)             05/28/2020                BUN                      9                   05/28/2020                CREATININE               0.93                05/28/2020                CALCIUM                  9.1                 05/28/2020                GFRNONAA                 >60                 01/07/2019                GFRAA                    >  60                 01/07/2019          )       Anesthesia Quick Evaluation

## 2020-07-28 ENCOUNTER — Encounter (HOSPITAL_COMMUNITY): Payer: Self-pay | Admitting: Obstetrics and Gynecology

## 2020-07-28 LAB — SURGICAL PATHOLOGY

## 2020-07-29 NOTE — Telephone Encounter (Signed)
Erroneous encounter

## 2020-07-29 NOTE — Anesthesia Postprocedure Evaluation (Signed)
Anesthesia Post Note  Patient: Cassie [name]  Procedure(s) Performed: DILATATION AND CURETTAGE /HYSTEROSCOPY (N/A Uterus) INTRAUTERINE DEVICE (IUD) INSERTION MIRENA (N/A Uterus)     Patient location during evaluation: PACU Anesthesia Type: General Level of consciousness: awake and alert Pain management: pain level controlled Vital Signs Assessment: post-procedure vital signs reviewed and stable Respiratory status: spontaneous breathing, nonlabored ventilation, respiratory function stable and patient connected to nasal cannula oxygen Cardiovascular status: blood pressure returned to baseline and stable Postop Assessment: no apparent nausea or vomiting Anesthetic complications: no   No complications documented.  Last Vitals:  Vitals:   07/27/20 1355 07/27/20 1410  BP: (!) 120/59 130/61  Pulse: 63 (!) 59  Resp: 16 16  Temp:  (!) 36.2 C  SpO2: 93% 100%    Last Pain:  Vitals:   07/27/20 1410  TempSrc:   PainSc: 0-No pain                 Belenda Cruise P Karletta Millay

## 2020-07-30 ENCOUNTER — Telehealth: Payer: Self-pay

## 2020-07-30 NOTE — Telephone Encounter (Signed)
pt called in to r/s her 3.28.22 visit to 4/1-as a phone visit, pt also req to have labs drawn at [facility] if we can make sure her orders state this is ok.  Cassie [name]

## 2020-07-31 ENCOUNTER — Ambulatory Visit: Payer: 59 | Admitting: Family

## 2020-08-03 ENCOUNTER — Ambulatory Visit: Payer: 59 | Admitting: Family

## 2020-08-03 ENCOUNTER — Other Ambulatory Visit: Payer: 59

## 2020-08-04 ENCOUNTER — Other Ambulatory Visit: Payer: Self-pay | Admitting: *Deleted

## 2020-08-04 ENCOUNTER — Telehealth: Payer: Self-pay

## 2020-08-04 DIAGNOSIS — I82401 Acute embolism and thrombosis of unspecified deep veins of right lower extremity: Secondary | ICD-10-CM

## 2020-08-04 DIAGNOSIS — Z86711 Personal history of pulmonary embolism: Secondary | ICD-10-CM

## 2020-08-04 NOTE — Telephone Encounter (Signed)
Labs added per secure chat    Cassie [name]

## 2020-08-05 ENCOUNTER — Encounter: Payer: Self-pay | Admitting: Obstetrics and Gynecology

## 2020-08-05 ENCOUNTER — Telehealth: Payer: Self-pay | Admitting: *Deleted

## 2020-08-05 NOTE — Telephone Encounter (Signed)
Patient called stating she is experiencing a great deal of heavy bleeding after a recent D and C.  Asking if ok to not take Eliquis today.  Dr Johnette Abraham and [facility] informed of above.  Ok to not take Eliquis today.  Patient informed.

## 2020-08-05 NOTE — Telephone Encounter (Signed)
I spoke with patient. She said she is changing pad every two hours and pad was not completely saturated. Clots about nickel sized now. Husband at work as Engineer, agricultural and cannot come home this afternoon to bring her. She wants to wait and watch it and see how goes and call prn.

## 2020-08-05 NOTE — Telephone Encounter (Signed)
I spoke with patient but she declined appointment today as husband is at work and not able to bring her. See her reply message for more.

## 2020-08-05 NOTE — Telephone Encounter (Signed)
Please have patient come to see me in office today.

## 2020-08-06 ENCOUNTER — Encounter: Payer: Self-pay | Admitting: Obstetrics and Gynecology

## 2020-08-06 ENCOUNTER — Encounter: Payer: Self-pay | Admitting: Family

## 2020-08-06 ENCOUNTER — Other Ambulatory Visit: Payer: 59

## 2020-08-06 ENCOUNTER — Telehealth: Payer: Self-pay

## 2020-08-06 NOTE — Telephone Encounter (Signed)
Pt called in to r/s her appts   Cassie [name]

## 2020-08-07 ENCOUNTER — Ambulatory Visit: Payer: 59 | Admitting: Family

## 2020-08-11 ENCOUNTER — Ambulatory Visit (INDEPENDENT_AMBULATORY_CARE_PROVIDER_SITE_OTHER): Payer: 59 | Admitting: Obstetrics and Gynecology

## 2020-08-11 ENCOUNTER — Encounter: Payer: Self-pay | Admitting: Obstetrics and Gynecology

## 2020-08-11 ENCOUNTER — Other Ambulatory Visit: Payer: Self-pay

## 2020-08-11 VITALS — BP 132/80 | HR 68

## 2020-08-11 DIAGNOSIS — Z9889 Other specified postprocedural states: Secondary | ICD-10-CM

## 2020-08-11 DIAGNOSIS — Z30431 Encounter for routine checking of intrauterine contraceptive device: Secondary | ICD-10-CM

## 2020-08-11 NOTE — Progress Notes (Signed)
GYNECOLOGY  VISIT   HPI: 60 y.o.   Married  Caucasian  female   [id number] with Patient's last menstrual period was 05/09/2010 (approximate).   here for 2 weeks status post DILATATION AND CURETTAGE /HYSTEROSCOPY (N/A Uterus) INTRAUTERINE DEVICE (IUD) INSERTION MIRENA (N/A Uterus).  Final pathology showed:  Scant endometrium with progestin effect int he background of microglandular adenosis, inflamed with squamous metaplasia.  No malignancy identified.  Husband is present for the visit today.  Off Aygestin.  Stopped right before surgery.  Did have heavy bleeding for 3 - 4 days last week.  Restarted Eliquis 2 days ago.   No cramping for 5 days.   Lost 15 pounds not intentionally.  Will see her GI.  GYNECOLOGIC HISTORY: Patient's last menstrual period was 05/09/2010 (approximate). Contraception: PMP/Mirena IUD 07-27-20 Menopausal hormone therapy:  none Last mammogram: 07-30-19 Neg/BiRads1 Last pap smear: 06-20-19 Neg:Neg HR HPV,         OB History    Gravida  0   Para  0   Term  0   Preterm  0   AB  0   Living  0     SAB  0   IAB  0   Ectopic  0   Multiple  0   Live Births  0              Patient Active Problem List   Diagnosis Date Noted  . Leg swelling 02/27/2020  . Hyperlipidemia 02/27/2020  . Head pain 10/25/2019  . Elevated LFTs 10/25/2019  . Aortic atherosclerosis ([place]) 08/22/2019  . NSVT (nonsustained ventricular tachycardia) ([place]) 07/10/2019  . Right lower quadrant abdominal pain 07/08/2019  . Simple endometrial hyperplasia 06/23/2019  . Postmenopausal bleeding 04/03/2019  . Cough variant asthma vs UACS from gerd 01/01/2018  . PAC (premature atrial contraction) 08/17/2017  . PVC (premature ventricular contraction) 08/17/2017  . PSVT (paroxysmal supraventricular tachycardia) ([place]) 08/17/2017  . Leg edema 05/19/2017  . Other fatigue 12/10/2016  . Myocardial bridge 12/10/2016  . Coronary artery calcification 12/10/2016  . History of pulmonary  embolism 12/10/2016  . GERD (gastroesophageal reflux disease) 07/12/2016  . Shortness of breath 05/31/2016  . Chronic venous insufficiency 05/31/2016  . UC (ulcerative colitis) ([place]) 01/28/2015  . Routine general medical examination at a health care facility 06/27/2014  . Left medial knee pain 07/17/2013  . Adrenal incidentaloma ([place]) 03/25/2013  . Allergic rhinitis 09/13/2011  . B12 deficiency 11/28/2008  . Vitamin D deficiency 06/30/2008  . Palpitations 06/30/2008  . Morbid obesity ([place]) 10/15/2007  . Anxiety about health 08/23/2007  . Cardiac dysrhythmia 08/23/2007    Past Medical History:  Diagnosis Date  . Adrenal nodule ([place])   . Allergy   . Anemia    in her early twenties  . Anxiety   . Arthritis    knees,thumbs  . Cardiac arrhythmia    palpitations,PVC'sAC  . Cataract    bilateral removed   . Complication of anesthesia    BP dropped with a colonoscopy in [place]. no problems since.  . Cyst of kidney, acquired    cyst right kidney- benign  . Degenerative disc disease, lumbar   . Dyspnea   . Dysrhythmia    palpitations  . Edema   . Gallstones   . History of anemia    20 yrs. ago not now- pt denies   . History of hiatal hernia   . Morbid obesity ([place])   . Osteoarthritis   . Other allergy, other than to  medicinal agents   . Panic disorder   . Pulmonary embolism ([place])   . Run of atrial premature complexes   . Sleep apnea    does not use cpap  . Ulcerative colitis, unspecified   . Vitamin B12 deficiency   . Vitamin D deficiency   . Wheelchair bound     Past Surgical History:  Procedure Laterality Date  . BREAST BIOPSY  11/2011   Left- benign  . CATARACT EXTRACTION Right   . CATARACT EXTRACTION Left   . CHOLECYSTECTOMY N/A 01/17/2014   Procedure: LAPAROSCOPIC CHOLECYSTECTOMY;  Surgeon: Excell Seltzer, MD;  Location: [facility];  Service: General;  Laterality: N/A;  . COLONOSCOPY    . DILATATION & CURETTAGE/HYSTEROSCOPY WITH MYOSURE N/A 12/23/2019    Procedure: DILATATION & CURETTAGE, HYSTEROSCOPY WITH MYOSURE, RESECTION OF POLYPOID TISSUE;  Surgeon: Megan Salon, MD;  Location: [facility];  Service: Gynecology;  Laterality: N/A;  . HYSTEROSCOPY WITH D & C N/A 07/27/2020   Procedure: DILATATION AND CURETTAGE /HYSTEROSCOPY;  Surgeon: [name], MD;  Location: [facility];  Service: Gynecology;  Laterality: N/A;  . INTRAUTERINE DEVICE (IUD) INSERTION N/A 07/27/2020   Procedure: INTRAUTERINE DEVICE (IUD) INSERTION MIRENA;  Surgeon: [name], MD;  Location: [facility];  Service: Gynecology;  Laterality: N/A;  . RETINAL LASER PROCEDURE Left   . SIGMOIDOSCOPY      Current Outpatient Medications  Medication Sig Dispense Refill  . acetaminophen (TYLENOL) 500 MG tablet Take 1,000 mg by mouth every 6 (six) hours as needed for moderate pain or headache.    . ALPRAZolam (XANAX) 1 MG tablet TAKE 1/2 TO 1 TABLET BY MOUTH 2 TIMES DAILY AS NEEDED FOR ANXIETY. (Patient taking differently: Take 0.5-1 mg by mouth See admin instructions. Take 0.5 tablet (0.5 mg) by mouth scheduled in the morning, may take an additional 0.5 tablet (0.5 mg) in the afternoon if needed for anxiety & take 1 tablet (1 mg) by mouth scheduled at bedtime.) 60 tablet 2  . atenolol (TENORMIN) 50 MG tablet Take 25 mg in the am and 12.5 mg in the pm.    . ELIQUIS 5 MG TABS tablet TAKE 1 TABLET BY MOUTH TWICE DAILY 60 tablet 6  . Probiotic Product (PROBIOTIC-10 PO) Take 2 tablets by mouth daily.    . Simethicone (GAS-X PO) Take 2 tablets by mouth 4 (four) times daily as needed (For gas.). Do not exceed 6 tablets in a 24 hour period.     No current facility-administered medications for this visit.     ALLERGIES: Doxycycline, Hydrocodone, Sudafed [pseudoephedrine hcl], Chlorhexidine gluconate, Acular [ketorolac tromethamine], Codeine, Diphenhydramine hcl, Lactose intolerance (gi), Mesalamine, Sulfasalazine, Azithromycin, Caffeine, Penicillins, and Prednisone  Family History   Problem Relation Age of Onset  . Heart disease Father 57       pacemaker  . Atrial fibrillation Father   . Prostate cancer Father   . Hypertension Maternal Aunt   . Endometrial cancer Maternal Aunt   . Hypertension Maternal Grandfather   . Colon cancer Neg Hx   . Stomach cancer Neg Hx   . Colon polyps Neg Hx   . Esophageal cancer Neg Hx   . Rectal cancer Neg Hx   . Ovarian cancer Neg Hx   . Pancreatic cancer Neg Hx   . Breast cancer Neg Hx     Social History   Socioeconomic History  . Marital status: Married    Spouse name: Not on file  . Number of  children: 0  . Years of education: Not on file  . Highest education level: Not on file  Occupational History  . Occupation: Arboriculturist: UNEMPLOYED  Tobacco Use  . Smoking status: Former Smoker    Packs/day: 2.00    Years: 5.00    Pack years: 10.00    Types: Cigarettes    Quit date: 03/07/1979    Years since quitting: 41.4  . Smokeless tobacco: Never Used  Vaping Use  . Vaping Use: Never used  Substance and Sexual Activity  . Alcohol use: No    Alcohol/week: 0.0 standard drinks  . Drug use: No  . Sexual activity: Not Currently    Partners: Male    Birth control/protection: Post-menopausal  Other Topics Concern  . Not on file  Social History Narrative   Married: 0 kids   Regular exercise: walks 2 times a week   Caffeine use: none   Social Determinants of Radio broadcast assistant Strain: Not on file  Food Insecurity: Not on file  Transportation Needs: Not on file  Physical Activity: Not on file  Stress: Not on file  Social Connections: Not on file  Intimate Partner Violence: Not on file    Review of Systems  All other systems reviewed and are negative.   PHYSICAL EXAMINATION:    BP 132/80 (Cuff Size: Large)   Pulse 68   LMP 05/09/2010 (Approximate)   SpO2 98%     General appearance: alert, cooperative and appears stated age  Pelvic: External genitalia:  no lesions               Urethra:  normal appearing urethra with no masses, tenderness or lesions              Bartholins and Skenes: normal                 Vagina: normal appearing vagina with normal color and discharge, no lesions              Cervix: no lesions.  IUD stings seen.  Minimal vaginal spotting.                 Bimanual Exam:  Uterus:  normal size, contour, position, consistency, mobility, non-tender              Adnexa: no mass, fullness, tenderness           Chaperone was present for exam.  ASSESSMENT  Status post hysteroscopy with dilation and curettage, placement of Mirena IUD.  Doing well post op. Off Aygestin.  No evidence of endometrial hyperplasia.  Hx PE.  Back on Eliquis.   PLAN  Surgical findings, procedure, and pathology report reviewed.  No planned schedule for routine endometrial biopsy.  Will follow patient clinically for now.  If hysterectomy is needed in the future, I would recommend a laparoscopic approach. FU in 3 months for annual exam.  She will update her mammogram.

## 2020-08-13 ENCOUNTER — Ambulatory Visit: Payer: 59 | Admitting: Gastroenterology

## 2020-08-18 ENCOUNTER — Other Ambulatory Visit: Payer: Self-pay

## 2020-08-18 ENCOUNTER — Inpatient Hospital Stay: Payer: 59 | Attending: Gynecologic Oncology

## 2020-08-18 ENCOUNTER — Encounter: Payer: Self-pay | Admitting: Obstetrics and Gynecology

## 2020-08-18 DIAGNOSIS — Z7901 Long term (current) use of anticoagulants: Secondary | ICD-10-CM | POA: Insufficient documentation

## 2020-08-18 DIAGNOSIS — Z86711 Personal history of pulmonary embolism: Secondary | ICD-10-CM | POA: Diagnosis present

## 2020-08-18 DIAGNOSIS — I82401 Acute embolism and thrombosis of unspecified deep veins of right lower extremity: Secondary | ICD-10-CM

## 2020-08-18 LAB — CBC WITH DIFFERENTIAL (CANCER CENTER ONLY)
Abs Immature Granulocytes: 0.02 10*3/uL (ref 0.00–0.07)
Basophils Absolute: 0 10*3/uL (ref 0.0–0.1)
Basophils Relative: 0 %
Eosinophils Absolute: 0.1 10*3/uL (ref 0.0–0.5)
Eosinophils Relative: 2 %
HCT: 41 % (ref 36.0–46.0)
Hemoglobin: 13.1 g/dL (ref 12.0–15.0)
Immature Granulocytes: 0 %
Lymphocytes Relative: 20 %
Lymphs Abs: 1.5 10*3/uL (ref 0.7–4.0)
MCH: 30.7 pg (ref 26.0–34.0)
MCHC: 32 g/dL (ref 30.0–36.0)
MCV: 96 fL (ref 80.0–100.0)
Monocytes Absolute: 0.5 10*3/uL (ref 0.1–1.0)
Monocytes Relative: 7 %
Neutro Abs: 5.6 10*3/uL (ref 1.7–7.7)
Neutrophils Relative %: 71 %
Platelet Count: 257 10*3/uL (ref 150–400)
RBC: 4.27 MIL/uL (ref 3.87–5.11)
RDW: 13.6 % (ref 11.5–15.5)
WBC Count: 7.8 10*3/uL (ref 4.0–10.5)
nRBC: 0 % (ref 0.0–0.2)

## 2020-08-18 LAB — CMP (CANCER CENTER ONLY)
ALT: 15 U/L (ref 0–44)
AST: 14 U/L — ABNORMAL LOW (ref 15–41)
Albumin: 3.3 g/dL — ABNORMAL LOW (ref 3.5–5.0)
Alkaline Phosphatase: 51 U/L (ref 38–126)
Anion gap: 6 (ref 5–15)
BUN: 7 mg/dL — ABNORMAL LOW (ref 8–23)
CO2: 30 mmol/L (ref 22–32)
Calcium: 9.2 mg/dL (ref 8.9–10.3)
Chloride: 105 mmol/L (ref 98–111)
Creatinine: 0.87 mg/dL (ref 0.44–1.00)
GFR, Estimated: >60 mL/min (ref >60)
Glucose, Bld: 115 mg/dL — ABNORMAL HIGH (ref 70–99)
Potassium: 4.2 mmol/L (ref 3.5–5.1)
Sodium: 141 mmol/L (ref 135–145)
Total Bilirubin: 0.4 mg/dL (ref 0.3–1.2)
Total Protein: 6.3 g/dL — ABNORMAL LOW (ref 6.5–8.1)

## 2020-08-18 LAB — D-DIMER, QUANTITATIVE: D-Dimer, Quant: 0.41 ug/mL-FEU (ref 0.00–0.50)

## 2020-08-19 ENCOUNTER — Inpatient Hospital Stay (HOSPITAL_BASED_OUTPATIENT_CLINIC_OR_DEPARTMENT_OTHER): Payer: 59 | Admitting: Family

## 2020-08-19 DIAGNOSIS — Z86711 Personal history of pulmonary embolism: Secondary | ICD-10-CM | POA: Diagnosis not present

## 2020-08-19 NOTE — Progress Notes (Signed)
Hematology and Oncology Follow Up Visit  Cassie [name] [id number] [birth year] 60 y.o. 08/19/2020   Principle Diagnosis:  Idiopathic right lower lobe pulmonary embolism Ulcerative colitis  Current Therapy: ELIQUIS 5 mg by mouth twice a day   Interim History:  Cassie [name] requested a telephone visit today. Patient verified using 2 identifiers.  She had biopsy and IUD placement on 07/27/20. Pathology showed "scant endometrium with progestin effect int he background of microglandular adenosis, inflamed with squamous metaplasia".  She restarted Eliquis on 08/11/20 and has not noted any blood loss for 2 weeks.  She noted improvement in her generalized aches and pains while off or Eliquis but states since restarting this is mild and tolerable.  She notes SOB at times with exertion. She states this seemed slightly improved while off of Eliquis as well.  She is eating a bland diet to prevent GI upset and colitis flare. She is due for her colonoscopy and has an appointment with GI at the end of the month.  She also due for mammogram and plans to call and schedule. No fever, chills, n/v, cough, rash, dizziness, chest pain, palpitations, abdominal pain or changes in bladder habits.  She has puffiness in the tops of her feet that comes and goes. This improves when she props up her feet.  She is doing her best to stay well hydrate.   ECOG Performance Status: 1 - Symptomatic but completely ambulatory  Medications:  Allergies as of 08/19/2020      Reactions   Doxycycline Palpitations   Hydrocodone Other (See Comments)   Hallucinations    Sudafed [pseudoephedrine Hcl] Shortness Of Breath   Chlorhexidine Gluconate Itching   Burning   Acular [ketorolac Tromethamine]    Unknown reaction   Codeine Other (See Comments)   Diphenhydramine Hcl Swelling   Throat feels like it swells    Lactose Intolerance (gi) Diarrhea, Other (See Comments)   bloating   Mesalamine Other (See Comments)    Hair loss   Sulfasalazine Diarrhea   Azithromycin Palpitations   Speeding heart rate per pt.   Caffeine Palpitations   Penicillins Rash   Has patient had a PCN reaction causing immediate rash, facial/tongue/throat swelling, SOB or lightheadedness with hypotension: Yes Has patient had a PCN reaction causing severe rash involving mucus membranes or skin necrosis: No Has patient had a PCN reaction that required hospitalization Yes Has patient had a PCN reaction occurring within the last 10 years: No If all of the above answers are "NO", then may proceed with Cephalosporin use.   Prednisone Palpitations      Medication List       Accurate as of August 19, 2020  3:23 PM. If you have any questions, ask your nurse or doctor.        acetaminophen 500 MG tablet Commonly known as: TYLENOL Take 1,000 mg by mouth every 6 (six) hours as needed for moderate pain or headache.   ALPRAZolam 1 MG tablet Commonly known as: XANAX TAKE 1/2 TO 1 TABLET BY MOUTH 2 TIMES DAILY AS NEEDED FOR ANXIETY. What changed: See the new instructions.   atenolol 50 MG tablet Commonly known as: TENORMIN Take 25 mg in the am and 12.5 mg in the pm.   Eliquis 5 MG Tabs tablet Generic drug: apixaban TAKE 1 TABLET BY MOUTH TWICE DAILY   GAS-X PO Take 2 tablets by mouth 4 (four) times daily as needed (For gas.). Do not exceed 6 tablets in a 24 hour period.  PROBIOTIC-10 PO Take 2 tablets by mouth daily.       Allergies:  Allergies  Allergen Reactions  . Doxycycline Palpitations  . Hydrocodone Other (See Comments)    Hallucinations   . Sudafed [Pseudoephedrine Hcl] Shortness Of Breath  . Chlorhexidine Gluconate Itching    Burning  . Acular [Ketorolac Tromethamine]     Unknown reaction   . Codeine Other (See Comments)  . Diphenhydramine Hcl Swelling    Throat feels like it swells   . Lactose Intolerance (Gi) Diarrhea and Other (See Comments)    bloating  . Mesalamine Other (See Comments)    Hair  loss  . Sulfasalazine Diarrhea  . Azithromycin Palpitations    Speeding heart rate per pt.   . Caffeine Palpitations  . Penicillins Rash    Has patient had a PCN reaction causing immediate rash, facial/tongue/throat swelling, SOB or lightheadedness with hypotension: Yes Has patient had a PCN reaction causing severe rash involving mucus membranes or skin necrosis: No Has patient had a PCN reaction that required hospitalization Yes Has patient had a PCN reaction occurring within the last 10 years: No If all of the above answers are "NO", then may proceed with Cephalosporin use.  . Prednisone Palpitations    Past Medical History, Surgical history, Social history, and Family History were reviewed and updated.  Review of Systems: All other 10 point review of systems is negative.   Physical Exam:  vitals were not taken for this visit.   Wt Readings from Last 3 Encounters:  07/27/20 (!) 320 lb (145.2 kg)  06/26/20 (!) 324 lb (147 kg)  06/01/20 (!) 328 lb (148.8 kg)     Lab Results  Component Value Date   WBC 7.8 08/18/2020   HGB 13.1 08/18/2020   HCT 41.0 08/18/2020   MCV 96.0 08/18/2020   PLT 257 08/18/2020   Lab Results  Component Value Date   FERRITIN 24 07/07/2020   IRON 72 07/17/2013   IRONPCTSAT 24.5 07/17/2013   Lab Results  Component Value Date   RBC 4.27 08/18/2020   No results found for: Nils Pyle [facility] Lab Results  Component Value Date   IGA 341 05/25/2015   No results found for: Odetta Pink, SPEI   Chemistry      Component Value Date/Time   NA 141 08/18/2020 0903   NA 139 01/24/2017 0929   K 4.2 08/18/2020 0903   K 4.0 01/24/2017 0929   CL 105 08/18/2020 0903   CL 103 10/10/2016 1403   CL 105 09/09/2016 1332   CO2 30 08/18/2020 0903   CO2 24 01/24/2017 0929   BUN 7 (L) 08/18/2020 0903   BUN 9.5 01/24/2017 0929   CREATININE 0.87 08/18/2020 0903   CREATININE 0.86  01/31/2020 0835   CREATININE 0.8 01/24/2017 0929      Component Value Date/Time   CALCIUM 9.2 08/18/2020 0903   CALCIUM 9.7 01/24/2017 0929   ALKPHOS 51 08/18/2020 0903   ALKPHOS 107 01/24/2017 0929   AST 14 (L) 08/18/2020 0903   AST 18 01/24/2017 0929   ALT 15 08/18/2020 0903   ALT 16 01/24/2017 0929   BILITOT 0.4 08/18/2020 0903   BILITOT 0.31 01/24/2017 0929       Impression and Plan: Ms. Daywalt is a pleasant 60yo caucasian female with history of PE. She had her endometrial biopsy and IUD placed. She has restarted Eliquis and is doing well so far. No more bleeding.  She will  continue her same regimen.  Follow-up and lab in 6 months. She will look into having her labs drawn by her PCP if she can as this is easier for her.  She can contact our office with any questions or concerns.    Laverna Peace, NP 4/13/20223:23 PM

## 2020-08-21 ENCOUNTER — Telehealth: Payer: Self-pay | Admitting: *Deleted

## 2020-08-21 NOTE — Telephone Encounter (Signed)
Per los 08/19/20 called patient to give upcoming appointments

## 2020-09-01 ENCOUNTER — Encounter: Payer: Self-pay | Admitting: Gastroenterology

## 2020-09-01 ENCOUNTER — Telehealth: Payer: Self-pay

## 2020-09-01 ENCOUNTER — Ambulatory Visit (INDEPENDENT_AMBULATORY_CARE_PROVIDER_SITE_OTHER): Payer: 59 | Admitting: Gastroenterology

## 2020-09-01 ENCOUNTER — Other Ambulatory Visit: Payer: Self-pay | Admitting: Internal Medicine

## 2020-09-01 VITALS — BP 128/82 | HR 58 | Ht 65.0 in | Wt 318.0 lb

## 2020-09-01 DIAGNOSIS — K51919 Ulcerative colitis, unspecified with unspecified complications: Secondary | ICD-10-CM

## 2020-09-01 DIAGNOSIS — Z860100 Personal history of colon polyps, unspecified: Secondary | ICD-10-CM | POA: Insufficient documentation

## 2020-09-01 MED ORDER — PLENVU 140 G PO SOLR: ORAL | 0 refills | Status: DC

## 2020-09-01 NOTE — Patient Instructions (Signed)
If you are age 62 or older, your body mass index should be between 23-30. Your Body mass index is 52.92 kg/m. If this is out of the aforementioned range listed, please consider follow up with your Primary Care Provider.  If you are age 48 or younger, your body mass index should be between 19-25. Your Body mass index is 52.92 kg/m. If this is out of the aformentioned range listed, please consider follow up with your Primary Care Provider.   You have been scheduled for a colonoscopy. Please follow written instructions given to you at your visit today.  Please pick up your prep supplies at the pharmacy within the next 1-3 days. If you use inhalers (even only as needed), please bring them with you on the day of your procedure.   Thank you for choosing me and [facility].  Alonza Bogus, PA-C

## 2020-09-01 NOTE — Progress Notes (Signed)
09/01/2020 Cassie [name] [id number] 09-26-1960   HISTORY OF PRESENT ILLNESS:  This is a 60 year old female who is a patient of Dr. Vena Rua with a long-standing history of ulcerative colitis diagnosed around age 89, B12 deficiency, arthritis, obesity, history of DVT and PE on Eliquis, probable sleep apnea who is here for follow-up.  She is here today with her husband.  Her last colonoscopy which was performed for surveillance was on 11/18/2015.  This revealed patchy mild inflammation in the descending colon, splenic flexure, transverse colon, hepatic flexure and ascending colon.  A 5 mm polyp was removed from the transverse colon.  Pathology showed chronically active moderate colitis in the right colon and chronically active minimal colitis in the left colon.  The polyp was a inflammatory pseudopolyp without dysplasia.  In the past she has been intolerant to sulfasalazine and mesalamine.  She has wanted to avoid escalation of therapy such as immunomodulators or Biologics.  She is not currently on any treatment for her condition.  She was due for colonoscopy in 2019, but was having some issues with shortness of breath so that was postponed for a while and then with COVID, etc. she continued to postpone the procedure.  She would like to schedule at this time.  She says that overall she is been relatively asymptomatic in regards to her UC, but states that she thinks she had a flare back in October.  At that time she was just seeing some mucus and maybe a tiny amount of blood.  Every once in a while she will have some diarrhea.  She feels like her diet has not gone back to normal since October.  It sounds a lot like apprehension  and afraid to eat certain things because she does not want to have another flare, but she has slowly been introducing more foods back into her diet again.    Past Medical History:  Diagnosis Date  . Adrenal nodule ([place])   . Allergy   . Anemia    in her early  twenties  . Anxiety   . Arthritis    knees,thumbs  . Cardiac arrhythmia    palpitations,PVC'sAC  . Cataract    bilateral removed   . Complication of anesthesia    BP dropped with a colonoscopy in [place]. no problems since.  . Cyst of kidney, acquired    cyst right kidney- benign  . Degenerative disc disease, lumbar   . Dyspnea   . Dysrhythmia    palpitations  . Edema   . Gallstones   . History of anemia    20 yrs. ago not now- pt denies   . History of hiatal hernia   . Morbid obesity ([place])   . Osteoarthritis   . Other allergy, other than to medicinal agents   . Panic disorder   . Pulmonary embolism ([place])   . Run of atrial premature complexes   . Sleep apnea    does not use cpap  . Ulcerative colitis, unspecified   . Vitamin B12 deficiency   . Vitamin D deficiency   . Wheelchair bound    Past Surgical History:  Procedure Laterality Date  . BREAST BIOPSY  11/2011   Left- benign  . CATARACT EXTRACTION Right   . CATARACT EXTRACTION Left   . CHOLECYSTECTOMY N/A 01/17/2014   Procedure: LAPAROSCOPIC CHOLECYSTECTOMY;  Surgeon: Excell Seltzer, MD;  Location: [facility];  Service: General;  Laterality: N/A;  . COLONOSCOPY    . DILATATION &  CURETTAGE/HYSTEROSCOPY WITH MYOSURE N/A 12/23/2019   Procedure: DILATATION & CURETTAGE, HYSTEROSCOPY WITH MYOSURE, RESECTION OF POLYPOID TISSUE;  Surgeon: Megan Salon, MD;  Location: [place];  Service: Gynecology;  Laterality: N/A;  . HYSTEROSCOPY WITH D & C N/A 07/27/2020   Procedure: DILATATION AND CURETTAGE /HYSTEROSCOPY;  Surgeon: [name], MD;  Location: [place];  Service: Gynecology;  Laterality: N/A;  . INTRAUTERINE DEVICE (IUD) INSERTION N/A 07/27/2020   Procedure: INTRAUTERINE DEVICE (IUD) INSERTION MIRENA;  Surgeon: [name], MD;  Location: [place];  Service: Gynecology;  Laterality: N/A;  . RETINAL LASER PROCEDURE Left   . SIGMOIDOSCOPY      reports that she quit smoking about 41 years ago. Her smoking use  included cigarettes. She has a 10.00 pack-year smoking history. She has never used smokeless tobacco. She reports that she does not drink alcohol and does not use drugs. family history includes Atrial fibrillation in her father; Endometrial cancer in her maternal aunt; Heart disease (age of onset: 68) in her father; Hypertension in her maternal aunt and maternal grandfather; Prostate cancer in her father. Allergies  Allergen Reactions  . Doxycycline Palpitations  . Hydrocodone Other (See Comments)    Hallucinations   . Sudafed [Pseudoephedrine Hcl] Shortness Of Breath  . Chlorhexidine Gluconate Itching    Burning  . Acular [Ketorolac Tromethamine]     Unknown reaction   . Codeine Other (See Comments)  . Diphenhydramine Hcl Swelling    Throat feels like it swells   . Lactose Intolerance (Gi) Diarrhea and Other (See Comments)    bloating  . Mesalamine Other (See Comments)    Hair loss  . Sulfasalazine Diarrhea  . Azithromycin Palpitations    Speeding heart rate per pt.   . Caffeine Palpitations  . Penicillins Rash      . Prednisone Palpitations      Outpatient Encounter Medications as of 09/01/2020  Medication Sig  . acetaminophen (TYLENOL) 500 MG tablet Take 1,000 mg by mouth every 6 (six) hours as needed for moderate pain or headache.  . ALPRAZolam (XANAX) 1 MG tablet TAKE 1/2 TO 1 TABLET BY MOUTH 2 TIMES DAILY AS NEEDED FOR ANXIETY. (Patient taking differently: Take 0.5-1 mg by mouth See admin instructions. Take 0.5 tablet (0.5 mg) by mouth scheduled in the morning, may take an additional 0.5 tablet (0.5 mg) in the afternoon if needed for anxiety & take 1 tablet (1 mg) by mouth scheduled at bedtime.)  . atenolol (TENORMIN) 50 MG tablet Take 25 mg in the am and 12.5 mg in the pm.  . ELIQUIS 5 MG TABS tablet TAKE 1 TABLET BY MOUTH TWICE DAILY  . levonorgestrel (MIRENA, 52 MG,) 20 MCG/24HR IUD 1 each by Intrauterine route once.  . Probiotic Product (PROBIOTIC-10 PO) Take 2 tablets  by mouth daily.  . Simethicone (GAS-X PO) Take 2 tablets by mouth 4 (four) times daily as needed (For gas.). Do not exceed 6 tablets in a 24 hour period.   No facility-administered encounter medications on file as of 09/01/2020.     REVIEW OF SYSTEMS  : All other systems reviewed and negative except where noted in the History of Present Illness.   PHYSICAL EXAM: BP 128/82 (BP Location: Right Arm, Patient Position: Sitting, Cuff Size: Large)   Pulse (!) 58   Ht 5' 5"  (1.651 m)   Wt (!) 315 lb (142.9 kg)   LMP 05/09/2010 (Approximate)   BMI 52.42 kg/m  General: Well developed  white female in no acute distress Head: Normocephalic and atraumatic Eyes:  Sclerae anicteric, conjunctiva pink. Ears: Normal auditory acuity Lungs: Clear throughout to auscultation; no W/R/R. Heart: Regular rate and rhythm; no M/R/G. Abdomen: Soft, non-distended.  BS present.  Non-tender. Rectal:  Will be done at the time of her colonoscopy. Musculoskeletal: Symmetrical with no gross deformities  Skin: No lesions on visible extremities Extremities: No edema  Neurological: Alert oriented x 4, grossly non-focal Psychological:  Alert and cooperative. Normal mood and affect  ASSESSMENT AND PLAN: 60 year old female with a long-standing history of ulcerative colitis diagnosed around age 72, B12 deficiency, arthritis, obesity, history of DVT and PE on Eliquis, probable sleep apnea who is here for follow-up.   1. Long-standing UC --long-standing colitis.  She is overdue for surveillance colonoscopy at this time.  We discussed the risk, benefits and alternatives and she is agreeable and wishes to proceed.  This will be done in the outpatient hospital setting due to her BMI.  She has been intolerant to 5-ASA medications and is not currently on any treatment.  2.  Long-term anticoagulation with Eliquis due to history of DVT:  Will hold Eliquis 2 days prior to endoscopic procedures.  Will instruct when and how to resume  after procedure. Benefits and risks of procedure explained including risks of bleeding, perforation, infection, missed lesions, reactions to medications and possible need for hospitalization and surgery for complications. Additional rare but real risk of stroke or other vascular clotting events off Eliquis also explained and need to seek urgent help if any signs of these problems occur. Will communicate by phone or EMR with patient's prescribing provider, Dr. Marin Olp, to confirm that holding Eliquis is reasonable in this case.    CC:  Hoyt Koch, *

## 2020-09-01 NOTE — Telephone Encounter (Signed)
   [name] [birth year] [id number]  Dear Dr.Ennever:  We have scheduled the above named patient for a(n) Colonoscopy procedure. Our records show that (s)he is on anticoagulation therapy.  Please advise as to whether the patient may come off their therapy of Eliquis 2 days prior to their procedure which is scheduled for 11/17/20.  Please route your response to [facility] or fax response to [phone].  Sincerely,    [facility]

## 2020-09-03 ENCOUNTER — Other Ambulatory Visit: Payer: Self-pay

## 2020-09-03 MED ORDER — NA SULFATE-K SULFATE-MG SULF 17.5-3.13-1.6 GM/177ML PO SOLN: 1.0000 | Freq: Once | ORAL | 0 refills | Status: AC

## 2020-09-04 NOTE — Telephone Encounter (Signed)
Called patient and let her know to hold eliquis 2 days prior to her procedure. Patient stated she would make a note of it on her paper work.

## 2020-09-09 ENCOUNTER — Telehealth: Payer: Self-pay | Admitting: Internal Medicine

## 2020-09-09 ENCOUNTER — Ambulatory Visit: Payer: 59 | Admitting: Internal Medicine

## 2020-09-09 NOTE — Telephone Encounter (Signed)
I spoke with the patient. She was scheduled to see Dr. Saunders Revel this afternoon, but had to be rescheduled due to Dr. Saunders Revel having a STEMI patient come in.  Per the patient, she has been noticing HR's in the lower 59's and once down in the 40's over the last couple of weeks.  Her HR's are typically upper 50's- low 60's. She advises she will have episodes of low HR's at times, but this will typically just last days instead of weeks.  The patient was taking atenolol 50 mg- 1/2 tablet (25 mg) in the AM & 1/4 tablet (12.5 mg) in the PM, but over the last several days, she has decreased this to 12.5 mg BID.  I did verify with her that she uses the atenolol 50 mg tablet. She states she tried the 25 mg tablets and cutting those in 1/2 was very difficult as the tablet would just crumble. She is able to 1/4 the 50 mg tablets much easier.   The patient advises that she has had some fatigue, but not feelings of pre-syncope/ syncope. She is r/s to see Dr. Saunders Revel on Friday 09/11/20.  I advised I will forward this to Dr. Saunders Revel as an Juluis Rainier and we will only call back prior to Friday if he has any recommendations prior to her appointment.  The patient voices understanding and is agreeable.

## 2020-09-09 NOTE — Telephone Encounter (Signed)
STAT if HR is under 50 or over 120 (normal HR is 60-100 beats per minute)  1) What is your heart rate? 40's -50's   2) Do you have a log of your heart rate readings (document readings)?  Baseline 50's - 60's now trending 2 weeks 40's - 50's   Patient states this has happened before but not sustained for this long   3) Do you have any other symptoms?  "out of it"    Patient rescheduled to 5/6 at 940

## 2020-09-10 NOTE — Telephone Encounter (Signed)
Continue current meds and f/u tomorrow as scheduled.  Nelva Bush, MD [facility]

## 2020-09-10 NOTE — Telephone Encounter (Signed)
Called patient and informed her of Dr. Darnelle Bos recommendation as seen below. Patient verbalized understanding and agreed with plan.

## 2020-09-11 ENCOUNTER — Other Ambulatory Visit: Payer: Self-pay

## 2020-09-11 ENCOUNTER — Encounter: Payer: Self-pay | Admitting: Internal Medicine

## 2020-09-11 ENCOUNTER — Ambulatory Visit: Payer: 59 | Admitting: Internal Medicine

## 2020-09-11 VITALS — BP 130/74 | HR 56 | Ht 65.0 in | Wt 315.0 lb

## 2020-09-11 DIAGNOSIS — I471 Supraventricular tachycardia: Secondary | ICD-10-CM

## 2020-09-11 DIAGNOSIS — M7989 Other specified soft tissue disorders: Secondary | ICD-10-CM

## 2020-09-11 DIAGNOSIS — R002 Palpitations: Secondary | ICD-10-CM

## 2020-09-11 DIAGNOSIS — R0602 Shortness of breath: Secondary | ICD-10-CM

## 2020-09-11 NOTE — Patient Instructions (Signed)
Medication Instructions:  Your physician recommends that you continue on your current medications as directed. Please refer to the Current Medication list given to you today.  *If you need a refill on your cardiac medications before your next appointment, please call your pharmacy*   Lab Work: None ordered If you have labs (blood work) drawn today and your tests are completely normal, you will receive your results only by: Marland Kitchen MyChart Message (if you have MyChart) OR . A paper copy in the mail If you have any lab test that is abnormal or we need to change your treatment, we will call you to review the results.   Testing/Procedures: None ordered   Follow-Up: At [facility], you and your health needs are our priority.  As part of our continuing mission to provide you with exceptional heart care, we have created designated Provider Care Teams.  These Care Teams include your primary Cardiologist (physician) and Advanced Practice Providers (APPs -  Physician Assistants and Nurse Practitioners) who all work together to provide you with the care you need, when you need it.  We recommend signing up for the patient portal called "MyChart".  Sign up information is provided on this After Visit Summary.  MyChart is used to connect with patients for Virtual Visits (Telemedicine).  Patients are able to view lab/test results, encounter notes, upcoming appointments, etc.  Non-urgent messages can be sent to your provider as well.   To learn more about what you can do with MyChart, go to [email or web address].    Your next appointment:   Your physician wants you to follow-up in: 6 months You will receive a reminder letter in the mail two months in advance. If you don't receive a letter, please call our office to schedule the follow-up appointment.   The format for your next appointment:   In Person  Provider:   You may see Nelva Bush, MD or one of the following Advanced Practice Providers on  your designated Care Team:    Murray Hodgkins, NP  Christell Faith, PA-C  Marrianne Mood, PA-C  Cadence [place], [place]  Laurann Montana, NP    Other Instructions N/A

## 2020-09-11 NOTE — Progress Notes (Signed)
Follow-up Outpatient Visit Date: 09/11/2020  Primary Care Provider: Hoyt Koch, [place] [address]  Chief Complaint: Follow-up palpitations  HPI:  Cassie [name] is a 60 y.o. female with history of palpitations with brief PSVT and PVCs, chest pain with myocardial bridging in the mid LAD, pulmonary embolism on chronic anticoagulation with apixaban, ulcerative colitis, vitamin B12 deficiency, vitamin D deficiency, anxiety, and morbid obesity, who presents for follow-up of palpitations.  Cassie [name] continues to have intermittent palpitations which seem stable.  Her resting heart rate has decreased a bit over the last 2 weeks, prompting her to drop atenolol to 12.5 mg BID from 25 mg QAM and 12.5 mg QPM.  She was dealing with recurrent vaginal bleeding over the last year and had been taking oral progesterone.  She recently had a Mirena IUD placed and wonders if discontinuation of oral progesterone could have affected her heart rate.  She also feels like her leg swelling has worsened over the last 2 weeks.  She has stable exertional dyspnea but denies chest pain.  She has lost some weight and is trying to watch her diet.  She does not exercise.  --------------------------------------------------------------------------------------------------  Past Medical History:  Diagnosis Date  . Adrenal nodule ([place])   . Allergy   . Anemia    in her early twenties  . Anxiety   . Arthritis    knees,thumbs  . Cardiac arrhythmia    palpitations,PVC'sAC  . Cataract    bilateral removed   . Complication of anesthesia    BP dropped with a colonoscopy in [place]. no problems since.  . Cyst of kidney, acquired    cyst right kidney- benign  . Degenerative disc disease, lumbar   . Dyspnea   . Dysrhythmia    palpitations  . Edema   . Gallstones   . History of anemia    20 yrs. ago not now- pt denies   . History of hiatal hernia   . Morbid obesity ([place])   . Osteoarthritis    . Other allergy, other than to medicinal agents   . Panic disorder   . Pulmonary embolism ([place])   . Run of atrial premature complexes   . Ulcerative colitis, unspecified   . Vitamin B12 deficiency   . Vitamin D deficiency   . Wheelchair bound    Past Surgical History:  Procedure Laterality Date  . BREAST BIOPSY  11/2011   Left- benign  . CATARACT EXTRACTION Right   . CATARACT EXTRACTION Left   . CHOLECYSTECTOMY N/A 01/17/2014   Procedure: LAPAROSCOPIC CHOLECYSTECTOMY;  Surgeon: Excell Seltzer, MD;  Location: [facility];  Service: General;  Laterality: N/A;  . COLONOSCOPY    . DILATATION & CURETTAGE/HYSTEROSCOPY WITH MYOSURE N/A 12/23/2019   Procedure: DILATATION & CURETTAGE, HYSTEROSCOPY WITH MYOSURE, RESECTION OF POLYPOID TISSUE;  Surgeon: Megan Salon, MD;  Location: [place];  Service: Gynecology;  Laterality: N/A;  . HYSTEROSCOPY WITH D & C N/A 07/27/2020   Procedure: DILATATION AND CURETTAGE /HYSTEROSCOPY;  Surgeon: [name], MD;  Location: [place];  Service: Gynecology;  Laterality: N/A;  . INTRAUTERINE DEVICE (IUD) INSERTION N/A 07/27/2020   Procedure: INTRAUTERINE DEVICE (IUD) INSERTION MIRENA;  Surgeon: [name], MD;  Location: [place];  Service: Gynecology;  Laterality: N/A;  . RETINAL LASER PROCEDURE Left   . SIGMOIDOSCOPY      Current Meds  Medication Sig  . acetaminophen (TYLENOL) 500 MG tablet Take 1,000 mg  by mouth every 6 (six) hours as needed for moderate pain or headache.  . ALPRAZolam (XANAX) 1 MG tablet TAKE 1/2 TO 1 TABLET BY MOUTH 2 TIMES DAILY AS NEEDED FOR ANXIETY.  Marland Kitchen atenolol (TENORMIN) 25 MG tablet Take 12.5 mg by mouth 2 (two) times daily.  Marland Kitchen ELIQUIS 5 MG TABS tablet TAKE 1 TABLET BY MOUTH TWICE DAILY  . levonorgestrel (MIRENA, 52 MG,) 20 MCG/24HR IUD 1 each by Intrauterine route once.  . Probiotic Product (PROBIOTIC-10 PO) Take 2 tablets by mouth daily.  . Simethicone (GAS-X PO) Take 2 tablets by mouth 4 (four) times daily as  needed (For gas.). Do not exceed 6 tablets in a 24 hour period.    Allergies: Doxycycline, Hydrocodone, Sudafed [pseudoephedrine hcl], Chlorhexidine gluconate, Acular [ketorolac tromethamine], Codeine, Diphenhydramine hcl, Lactose intolerance (gi), Mesalamine, Sulfasalazine, Azithromycin, Caffeine, Penicillins, and Prednisone  Social History   Tobacco Use  . Smoking status: Former Smoker    Packs/day: 2.00    Years: 5.00    Pack years: 10.00    Types: Cigarettes    Quit date: 03/07/1979    Years since quitting: 41.5  . Smokeless tobacco: Never Used  Vaping Use  . Vaping Use: Never used  Substance Use Topics  . Alcohol use: No    Alcohol/week: 0.0 standard drinks  . Drug use: No    Family History  Problem Relation Age of Onset  . Heart disease Father 5       pacemaker  . Atrial fibrillation Father   . Prostate cancer Father   . Hypertension Maternal Aunt   . Endometrial cancer Maternal Aunt   . Hypertension Maternal Grandfather   . Colon cancer Neg Hx   . Stomach cancer Neg Hx   . Colon polyps Neg Hx   . Esophageal cancer Neg Hx   . Rectal cancer Neg Hx   . Ovarian cancer Neg Hx   . Pancreatic cancer Neg Hx   . Breast cancer Neg Hx     Review of Systems: A 12-system review of systems was performed and was negative except as noted in the HPI.  --------------------------------------------------------------------------------------------------  Physical Exam: BP 130/74 (BP Location: Left Arm, Patient Position: Sitting, Cuff Size: Large)   Pulse (!) 56   Ht 5' 5"  (1.651 m)   Wt (!) 315 lb (142.9 kg)   LMP 05/09/2010 (Approximate)   SpO2 99%   BMI 52.42 kg/m   General:  NAD. Neck: No JVD or HJR. Lungs: Clear to auscultation bilaterally without wheezes or crackles. Heart: Distant heart sounds.  Bradycardic but regular without murmurs, rubs, or gallops. Abdomen: Soft, nontender, nondistended. Extremities: No lower extremity edema.  EKG:  Sinus bradycardia (HR  56 bpm) with low voltage.  No significant abnormality.  Lab Results  Component Value Date   WBC 7.8 08/18/2020   HGB 13.1 08/18/2020   HCT 41.0 08/18/2020   MCV 96.0 08/18/2020   PLT 257 08/18/2020    Lab Results  Component Value Date   NA 141 08/18/2020   K 4.2 08/18/2020   CL 105 08/18/2020   CO2 30 08/18/2020   BUN 7 (L) 08/18/2020   CREATININE 0.87 08/18/2020   GLUCOSE 115 (H) 08/18/2020   ALT 15 08/18/2020    Lab Results  Component Value Date   CHOL 162 01/31/2020   HDL 29 (L) 01/31/2020   LDLCALC 117 (H) 01/31/2020   TRIG 66 01/31/2020   CHOLHDL 5.6 (H) 01/31/2020    --------------------------------------------------------------------------------------------------  ASSESSMENT AND PLAN: Palpitations  and PSVT: Overall, symptoms have been fairly stable.  Cassie [name] is concerned about more bradycardia recently.  I think it is reasonable for her to continue atenolol 12.5 mg BID.  Shortness of breath and leg swelling: These have been chronic issues, with waxing and waning leg edema for years.  Cassie [name] did not respond to furosemide in the past.  I have encouraged her to elevate her legs when possible and to work on weight loss.  Morbid obesity: BMI remains > 50 despite recent modest weight loss.  We discussed the importance of continued weight loss through a combination of diet and exercise.  Cassie [name] questions diagnosis of sleep apnea mentioned to her by anesthesia prior to recent gyn procedure.  Prior sleep study showed minimal sleep apnea without overnight desaturation.  Cassie [name] was advised to avoid lying flat on her back when sleeping.  No other intervention was recommended.  Follow-up: Return to clinic in 6 months.  Nelva Bush, MD 09/11/2020 9:57 AM

## 2020-09-15 ENCOUNTER — Encounter: Payer: Self-pay | Admitting: Obstetrics and Gynecology

## 2020-09-16 NOTE — Progress Notes (Signed)
Addendum: Reviewed and agree with assessment and management plan. Miral Hoopes M, MD  

## 2020-09-19 ENCOUNTER — Encounter: Payer: Self-pay | Admitting: Internal Medicine

## 2020-09-21 MED ORDER — NITROFURANTOIN MONOHYD MACRO 100 MG PO CAPS: 100.0000 mg | ORAL_CAPSULE | Freq: Two times a day (BID) | ORAL | 0 refills | Status: DC

## 2020-10-01 ENCOUNTER — Ambulatory Visit: Payer: 59 | Admitting: Internal Medicine

## 2020-10-15 ENCOUNTER — Encounter: Payer: Self-pay | Admitting: Obstetrics and Gynecology

## 2020-10-16 NOTE — Telephone Encounter (Signed)
Patient would like to see if IUD check could be moved up sooner?

## 2020-10-22 NOTE — Progress Notes (Signed)
Opened in error

## 2020-10-27 ENCOUNTER — Encounter: Payer: 59 | Admitting: Obstetrics and Gynecology

## 2020-10-27 ENCOUNTER — Other Ambulatory Visit: Payer: Self-pay

## 2020-10-28 ENCOUNTER — Encounter: Payer: Self-pay | Admitting: Obstetrics and Gynecology

## 2020-10-28 NOTE — Progress Notes (Signed)
GYNECOLOGY  VISIT   HPI: 60 y.o.   Married  Caucasian  female   [id number] with Patient's last menstrual period was 05/09/2010 (approximate).   here for 3 month follow up from hysteroscopy and Mirena IUD insertion. Husband present for the visit today.  No vaginal bleeding.   Patient treated for UTI a month ago and would like to have urine checked today.  Took Macrobid for one week.  Pressure to void at times.  Some low back discomfort.  No fevers or nausea.  Has clear milky discharge.  Occasional yellow discharge.  No change in odor.   She was having some pelvic pain, so she checked for her IUD string.  Wants to check and see if it is in placed.   She wants to check [facility] and estradiol to see if it has changed. She has some breast tenderness at times.  Not constant. She still has some pains in her lower abdomen.  Has some PMS symptoms.   GYNECOLOGIC HISTORY: Patient's last menstrual period was 05/09/2010 (approximate). Contraception:  Mirena IUD 07-27-20 Menopausal hormone therapy:  none Last mammogram:  09-15-20 3D/Neg/BiRads1 Last pap smear:  06-20-19 Neg:Neg HR HPV        OB History     Gravida  0   Para  0   Term  0   Preterm  0   AB  0   Living  0      SAB  0   IAB  0   Ectopic  0   Multiple  0   Live Births  0              Patient Active Problem List   Diagnosis Date Noted   History of colonic polyps 09/01/2020   Leg swelling 02/27/2020   Hyperlipidemia 02/27/2020   Head pain 10/25/2019   Elevated LFTs 10/25/2019   Aortic atherosclerosis ([place]) 08/22/2019   NSVT (nonsustained ventricular tachycardia) ([place]) 07/10/2019   Right lower quadrant abdominal pain 07/08/2019   Simple endometrial hyperplasia 06/23/2019   Postmenopausal bleeding 04/03/2019   Cough variant asthma vs UACS from gerd 01/01/2018   PAC (premature atrial contraction) 08/17/2017   PVC (premature ventricular contraction) 08/17/2017   PSVT (paroxysmal supraventricular  tachycardia) ([place]) 08/17/2017   Leg edema 05/19/2017   Other fatigue 12/10/2016   Myocardial bridge 12/10/2016   Coronary artery calcification 12/10/2016   History of pulmonary embolism 12/10/2016   GERD (gastroesophageal reflux disease) 07/12/2016   Shortness of breath 05/31/2016   Chronic venous insufficiency 05/31/2016   UC (ulcerative colitis) ([place]) 01/28/2015   Routine general medical examination at a health care facility 06/27/2014   Left medial knee pain 07/17/2013   Adrenal incidentaloma ([place]) 03/25/2013   Allergic rhinitis 09/13/2011   B12 deficiency 11/28/2008   Vitamin D deficiency 06/30/2008   Palpitations 06/30/2008   Morbid obesity ([place]) 10/15/2007   Anxiety about health 08/23/2007   Cardiac dysrhythmia 08/23/2007    Past Medical History:  Diagnosis Date   Adrenal nodule ([place])    Allergy    Anemia    in her early twenties   Anxiety    Arthritis    knees,thumbs   Cardiac arrhythmia    palpitations,PVC'sAC   Cataract    bilateral removed    Complication of anesthesia    BP dropped with a colonoscopy in [place]. no problems since.   Cyst of kidney, acquired    cyst right kidney- benign   Degenerative disc disease, lumbar    Dyspnea  Dysrhythmia    palpitations   Edema    Gallstones    History of anemia    20 yrs. ago not now- pt denies    History of hiatal hernia    Morbid obesity ([place])    Osteoarthritis    Other allergy, other than to medicinal agents    Panic disorder    Pulmonary embolism ([place])    Run of atrial premature complexes    Ulcerative colitis, unspecified    Vitamin B12 deficiency    Vitamin D deficiency    Wheelchair bound     Past Surgical History:  Procedure Laterality Date   BREAST BIOPSY  11/2011   Left- benign   CATARACT EXTRACTION Right    CATARACT EXTRACTION Left    CHOLECYSTECTOMY N/A 01/17/2014   Procedure: LAPAROSCOPIC CHOLECYSTECTOMY;  Surgeon: Excell Seltzer, MD;  Location: [facility];  Service: General;  Laterality:  N/A;   COLONOSCOPY     DILATATION & CURETTAGE/HYSTEROSCOPY WITH MYOSURE N/A 12/23/2019   Procedure: DILATATION & CURETTAGE, HYSTEROSCOPY WITH MYOSURE, RESECTION OF POLYPOID TISSUE;  Surgeon: Megan Salon, MD;  Location: [place];  Service: Gynecology;  Laterality: N/A;   HYSTEROSCOPY WITH D & C N/A 07/27/2020   Procedure: DILATATION AND CURETTAGE /HYSTEROSCOPY;  Surgeon: [name], MD;  Location: [place];  Service: Gynecology;  Laterality: N/A;   INTRAUTERINE DEVICE (IUD) INSERTION N/A 07/27/2020   Procedure: INTRAUTERINE DEVICE (IUD) INSERTION MIRENA;  Surgeon: [name], MD;  Location: [place];  Service: Gynecology;  Laterality: N/A;   RETINAL LASER PROCEDURE Left    SIGMOIDOSCOPY      Current Outpatient Medications  Medication Sig Dispense Refill   acetaminophen (TYLENOL) 500 MG tablet Take 1,000 mg by mouth every 6 (six) hours as needed for moderate pain or headache.     ALPRAZolam (XANAX) 1 MG tablet TAKE 1/2 TO 1 TABLET BY MOUTH 2 TIMES DAILY AS NEEDED FOR ANXIETY. 60 tablet 2   atenolol (TENORMIN) 25 MG tablet Take 12.5 mg by mouth 2 (two) times daily.     ELIQUIS 5 MG TABS tablet TAKE 1 TABLET BY MOUTH TWICE DAILY 60 tablet 6   levonorgestrel (MIRENA, 52 MG,) 20 MCG/24HR IUD 1 each by Intrauterine route once.     Probiotic Product (PROBIOTIC-10 PO) Take 2 tablets by mouth daily.     Simethicone (GAS-X PO) Take 2 tablets by mouth 4 (four) times daily as needed (For gas.). Do not exceed 6 tablets in a 24 hour period.     No current facility-administered medications for this visit.     ALLERGIES: Doxycycline, Hydrocodone, Sudafed [pseudoephedrine hcl], Chlorhexidine gluconate, Acular [ketorolac tromethamine], Codeine, Diphenhydramine hcl, Lactose intolerance (gi), Mesalamine, Sulfasalazine, Azithromycin, Caffeine, Penicillins, and Prednisone  Family History  Problem Relation Age of Onset   Heart disease Father 17       pacemaker   Atrial fibrillation Father     Prostate cancer Father    Hypertension Maternal Aunt    Endometrial cancer Maternal Aunt    Hypertension Maternal Grandfather    Colon cancer Neg Hx    Stomach cancer Neg Hx    Colon polyps Neg Hx    Esophageal cancer Neg Hx    Rectal cancer Neg Hx    Ovarian cancer Neg Hx    Pancreatic cancer Neg Hx    Breast cancer Neg Hx     Social History   Socioeconomic History   Marital status: Married    Spouse  name: Not on file   Number of children: 0   Years of education: Not on file   Highest education level: Not on file  Occupational History   Occupation: Housewife    Employer: UNEMPLOYED  Tobacco Use   Smoking status: Former    Packs/day: 2.00    Years: 5.00    Pack years: 10.00    Types: Cigarettes    Quit date: 03/07/1979    Years since quitting: 41.6   Smokeless tobacco: Never  Vaping Use   Vaping Use: Never used  Substance and Sexual Activity   Alcohol use: No    Alcohol/week: 0.0 standard drinks   Drug use: No   Sexual activity: Not Currently    Partners: Male    Birth control/protection: Post-menopausal  Other Topics Concern   Not on file  Social History Narrative   Married: 0 kids   Regular exercise: walks 2 times a week   Caffeine use: none   Social Determinants of Radio broadcast assistant Strain: Not on file  Food Insecurity: Not on file  Transportation Needs: Not on file  Physical Activity: Not on file  Stress: Not on file  Social Connections: Not on file  Intimate Partner Violence: Not on file    Review of Systems  All other systems reviewed and are negative.  PHYSICAL EXAMINATION:    BP 118/80 (Cuff Size: Large)   Pulse (!) 58   LMP 05/09/2010 (Approximate)   SpO2 99%     General appearance: alert, cooperative and appears stated age  Pelvic: External genitalia:  no lesions              Urethra:  normal appearing urethra with no masses, tenderness or lesions              Bartholins and Skenes: normal                 Vagina:  normal appearing vagina with normal color and discharge, no lesions              Cervix: no lesions.  IUD strings noted.                 Bimanual Exam:  Uterus:  normal size, contour, position, consistency, mobility, non-tender              Adnexa: no mass, fullness, tenderness                Chaperone was present for exam.  ASSESSMENT  Pelvic pressure.  Recent UTI.  Vaginal discharge. Mirena IUD in place.  PLAN  Nuswab for vaginitis.  Urine culture. Will wait for results.  No Rx today. Reassurance regarding IUD position.  Return for well woman visit in 3 months.   22 min total time was spent for this patient encounter, including preparation, face-to-face counseling with the patient, coordination of care, and documentation of the encounter.

## 2020-10-29 ENCOUNTER — Ambulatory Visit: Payer: 59 | Admitting: Obstetrics and Gynecology

## 2020-10-29 ENCOUNTER — Other Ambulatory Visit (HOSPITAL_COMMUNITY)
Admission: RE | Admit: 2020-10-29 | Discharge: 2020-10-29 | Disposition: A | Discharge status: Home / Self Care | Payer: 59 | Source: Ambulatory Visit | Attending: Obstetrics and Gynecology | Admitting: Obstetrics and Gynecology

## 2020-10-29 ENCOUNTER — Other Ambulatory Visit: Payer: Self-pay

## 2020-10-29 ENCOUNTER — Encounter: Payer: Self-pay | Admitting: Obstetrics and Gynecology

## 2020-10-29 VITALS — BP 118/80 | HR 58

## 2020-10-29 DIAGNOSIS — Z30431 Encounter for routine checking of intrauterine contraceptive device: Secondary | ICD-10-CM

## 2020-10-29 DIAGNOSIS — N898 Other specified noninflammatory disorders of vagina: Secondary | ICD-10-CM | POA: Diagnosis not present

## 2020-10-29 DIAGNOSIS — R102 Pelvic and perineal pain: Secondary | ICD-10-CM | POA: Diagnosis not present

## 2020-10-29 LAB — CERVICOVAGINAL ANCILLARY ONLY
Bacterial Vaginitis (gardnerella): POSITIVE — AB
Candida Glabrata: NEGATIVE
Candida Vaginitis: NEGATIVE
Comment: NEGATIVE
Comment: NEGATIVE
Comment: NEGATIVE
Comment: NEGATIVE
Trichomonas: NEGATIVE

## 2020-10-30 ENCOUNTER — Telehealth: Payer: Self-pay | Admitting: *Deleted

## 2020-10-30 ENCOUNTER — Encounter: Payer: Self-pay | Admitting: Obstetrics and Gynecology

## 2020-10-30 LAB — URINE CULTURE
MICRO NUMBER:: 12042481
SPECIMEN QUALITY:: ADEQUATE

## 2020-10-30 MED ORDER — METRONIDAZOLE 0.75 % VA GEL: 1.0000 | Freq: Every day | VAGINAL | 0 refills | Status: DC

## 2020-10-30 NOTE — Telephone Encounter (Signed)
Vaginitis testing reviewed showing bacterial vaginosis. She may treat with Flagyl 500 mg po bid for 7 days or Metrogel pv at hs for 5 nights.  The oral abx can tend to cause GI upset, but is it her choice which she prefers.  Please be sure she is not doing any douching or internal vaginal cleansing, as this can be a risk factor for BV to occur.

## 2020-10-30 NOTE — Telephone Encounter (Signed)
Cassie [name] E, MD 2 hours ago (8:24 AM)      Same bacteria as last time? I have a hard time with antibiotics. I'm thinking this was the one that had the tube insert that was so difficult to use. Any other options? Checking before an rx is called in. Wonder why I keep having this? Thanks.     Routing to Dr. Quincy Simmonds to review and advise on results.

## 2020-10-30 NOTE — Telephone Encounter (Signed)
See telephone encounter dated 10/30/20.   Encounter closed.

## 2020-10-31 ENCOUNTER — Encounter: Payer: Self-pay | Admitting: Obstetrics and Gynecology

## 2020-11-02 NOTE — Telephone Encounter (Signed)
I called patient to discuss the below and patient said she was called on Friday on 10/30/20 regarding the below. Patient said she started the medication.

## 2020-11-08 ENCOUNTER — Other Ambulatory Visit: Payer: Self-pay | Admitting: Internal Medicine

## 2020-11-11 ENCOUNTER — Other Ambulatory Visit: Payer: Self-pay

## 2020-11-11 ENCOUNTER — Ambulatory Visit: Payer: 59 | Admitting: Obstetrics and Gynecology

## 2020-11-11 ENCOUNTER — Encounter (HOSPITAL_COMMUNITY): Payer: Self-pay | Admitting: Internal Medicine

## 2020-11-11 DIAGNOSIS — R197 Diarrhea, unspecified: Secondary | ICD-10-CM

## 2020-11-12 ENCOUNTER — Encounter: Payer: Self-pay | Admitting: Family

## 2020-11-12 ENCOUNTER — Other Ambulatory Visit: Payer: 59

## 2020-11-12 DIAGNOSIS — R197 Diarrhea, unspecified: Secondary | ICD-10-CM

## 2020-11-13 ENCOUNTER — Other Ambulatory Visit (HOSPITAL_COMMUNITY)
Admission: RE | Admit: 2020-11-13 | Discharge: 2020-11-13 | Disposition: A | Discharge status: Home / Self Care | Payer: 59 | Source: Ambulatory Visit | Attending: Internal Medicine | Admitting: Internal Medicine

## 2020-11-13 DIAGNOSIS — Z20822 Contact with and (suspected) exposure to covid-19: Secondary | ICD-10-CM | POA: Insufficient documentation

## 2020-11-13 DIAGNOSIS — Z01812 Encounter for preprocedural laboratory examination: Secondary | ICD-10-CM | POA: Diagnosis present

## 2020-11-13 LAB — CLOSTRIDIUM DIFFICILE TOXIN B, QUALITATIVE, REAL-TIME PCR: Toxigenic C. Difficile by PCR: NOT DETECTED

## 2020-11-13 LAB — SARS CORONAVIRUS 2 (TAT 6-24 HRS): SARS Coronavirus 2: NEGATIVE

## 2020-11-16 ENCOUNTER — Telehealth: Payer: Self-pay | Admitting: Gastroenterology

## 2020-11-16 LAB — CALPROTECTIN, FECAL: Calprotectin, Fecal: 1063 ug/g — ABNORMAL HIGH (ref 0–120)

## 2020-11-16 NOTE — Telephone Encounter (Signed)
Inbound call from pt requesting a call back stating that blood work came back elevated and wanted to know if she still needed to do the surgery tomorrow. Please advise. Thank you.

## 2020-11-16 NOTE — Telephone Encounter (Signed)
Returned pt call and left a message for her with information that the stool studies were negative and she can proceed with procedure tomorrow  7/12.  She was advised to call back with any further questions.  I will also send a My Chart message

## 2020-11-17 ENCOUNTER — Ambulatory Visit (HOSPITAL_COMMUNITY)
Admission: RE | Admit: 2020-11-17 | Discharge: 2020-11-17 | Disposition: A | Discharge status: Home / Self Care | Payer: 59 | Attending: Internal Medicine | Admitting: Internal Medicine

## 2020-11-17 ENCOUNTER — Ambulatory Visit (HOSPITAL_COMMUNITY): Payer: 59 | Admitting: Anesthesiology

## 2020-11-17 ENCOUNTER — Telehealth: Payer: Self-pay | Admitting: *Deleted

## 2020-11-17 ENCOUNTER — Other Ambulatory Visit: Payer: Self-pay

## 2020-11-17 ENCOUNTER — Encounter (HOSPITAL_COMMUNITY): Payer: Self-pay | Admitting: Internal Medicine

## 2020-11-17 ENCOUNTER — Encounter (HOSPITAL_COMMUNITY)
Admission: RE | Disposition: A | Discharge status: Home / Self Care | Payer: Self-pay | Source: Home / Self Care | Attending: Internal Medicine

## 2020-11-17 DIAGNOSIS — Z8249 Family history of ischemic heart disease and other diseases of the circulatory system: Secondary | ICD-10-CM | POA: Diagnosis not present

## 2020-11-17 DIAGNOSIS — G4730 Sleep apnea, unspecified: Secondary | ICD-10-CM | POA: Diagnosis not present

## 2020-11-17 DIAGNOSIS — Z86718 Personal history of other venous thrombosis and embolism: Secondary | ICD-10-CM | POA: Diagnosis not present

## 2020-11-17 DIAGNOSIS — Z880 Allergy status to penicillin: Secondary | ICD-10-CM | POA: Insufficient documentation

## 2020-11-17 DIAGNOSIS — Z8042 Family history of malignant neoplasm of prostate: Secondary | ICD-10-CM | POA: Diagnosis not present

## 2020-11-17 DIAGNOSIS — Z885 Allergy status to narcotic agent status: Secondary | ICD-10-CM | POA: Diagnosis not present

## 2020-11-17 DIAGNOSIS — D125 Benign neoplasm of sigmoid colon: Secondary | ICD-10-CM

## 2020-11-17 DIAGNOSIS — Z881 Allergy status to other antibiotic agents status: Secondary | ICD-10-CM | POA: Diagnosis not present

## 2020-11-17 DIAGNOSIS — D124 Benign neoplasm of descending colon: Secondary | ICD-10-CM | POA: Diagnosis not present

## 2020-11-17 DIAGNOSIS — Z882 Allergy status to sulfonamides status: Secondary | ICD-10-CM | POA: Insufficient documentation

## 2020-11-17 DIAGNOSIS — Z884 Allergy status to anesthetic agent status: Secondary | ICD-10-CM | POA: Insufficient documentation

## 2020-11-17 DIAGNOSIS — D123 Benign neoplasm of transverse colon: Secondary | ICD-10-CM

## 2020-11-17 DIAGNOSIS — Z8049 Family history of malignant neoplasm of other genital organs: Secondary | ICD-10-CM | POA: Insufficient documentation

## 2020-11-17 DIAGNOSIS — K5100 Ulcerative (chronic) pancolitis without complications: Secondary | ICD-10-CM

## 2020-11-17 DIAGNOSIS — Z1211 Encounter for screening for malignant neoplasm of colon: Secondary | ICD-10-CM | POA: Insufficient documentation

## 2020-11-17 DIAGNOSIS — Z6841 Body Mass Index (BMI) 40.0 and over, adult: Secondary | ICD-10-CM | POA: Diagnosis not present

## 2020-11-17 DIAGNOSIS — K51919 Ulcerative colitis, unspecified with unspecified complications: Secondary | ICD-10-CM

## 2020-11-17 DIAGNOSIS — Z87891 Personal history of nicotine dependence: Secondary | ICD-10-CM | POA: Diagnosis not present

## 2020-11-17 DIAGNOSIS — K648 Other hemorrhoids: Secondary | ICD-10-CM | POA: Insufficient documentation

## 2020-11-17 DIAGNOSIS — D127 Benign neoplasm of rectosigmoid junction: Secondary | ICD-10-CM | POA: Diagnosis not present

## 2020-11-17 DIAGNOSIS — E739 Lactose intolerance, unspecified: Secondary | ICD-10-CM | POA: Insufficient documentation

## 2020-11-17 DIAGNOSIS — Z7901 Long term (current) use of anticoagulants: Secondary | ICD-10-CM | POA: Insufficient documentation

## 2020-11-17 DIAGNOSIS — Z888 Allergy status to other drugs, medicaments and biological substances status: Secondary | ICD-10-CM | POA: Diagnosis not present

## 2020-11-17 DIAGNOSIS — Z86010 Personal history of colonic polyps: Secondary | ICD-10-CM

## 2020-11-17 DIAGNOSIS — K51011 Ulcerative (chronic) pancolitis with rectal bleeding: Secondary | ICD-10-CM | POA: Insufficient documentation

## 2020-11-17 DIAGNOSIS — E669 Obesity, unspecified: Secondary | ICD-10-CM | POA: Diagnosis not present

## 2020-11-17 DIAGNOSIS — D120 Benign neoplasm of cecum: Secondary | ICD-10-CM | POA: Insufficient documentation

## 2020-11-17 HISTORY — PX: POLYPECTOMY: SHX5525

## 2020-11-17 HISTORY — DX: Presence of (intrauterine) contraceptive device: Z97.5

## 2020-11-17 HISTORY — PX: BIOPSY: SHX5522

## 2020-11-17 HISTORY — PX: COLONOSCOPY WITH PROPOFOL: SHX5780

## 2020-11-17 HISTORY — PX: SUBMUCOSAL TATTOO INJECTION: SHX6856

## 2020-11-17 SURGERY — COLONOSCOPY WITH PROPOFOL
Anesthesia: Monitor Anesthesia Care

## 2020-11-17 MED ORDER — SPOT INK MARKER SYRINGE KIT
PACK | SUBMUCOSAL | Status: AC
  Filled 2020-11-17: qty 5

## 2020-11-17 MED ORDER — SODIUM CHLORIDE 0.9 % IV SOLN: INTRAVENOUS | Status: DC

## 2020-11-17 MED ORDER — PROPOFOL 500 MG/50ML IV EMUL
INTRAVENOUS | Status: DC | PRN
  Administered 2020-11-17: 130 ug/kg/min via INTRAVENOUS

## 2020-11-17 MED ORDER — LACTATED RINGERS IV SOLN: INTRAVENOUS | Status: DC

## 2020-11-17 MED ORDER — SPOT INK MARKER SYRINGE KIT
PACK | SUBMUCOSAL | Status: DC | PRN
  Administered 2020-11-17: 3 mL via SUBMUCOSAL

## 2020-11-17 MED ORDER — PROPOFOL 10 MG/ML IV BOLUS
INTRAVENOUS | Status: DC | PRN
  Administered 2020-11-17: 50 mg via INTRAVENOUS

## 2020-11-17 SURGICAL SUPPLY — 21 items

## 2020-11-17 NOTE — Transfer of Care (Signed)
Immediate Anesthesia Transfer of Care Note  Patient: Cassie [name]  Procedure(s) Performed: COLONOSCOPY WITH PROPOFOL BIOPSY POLYPECTOMY SUBMUCOSAL TATTOO INJECTION  Patient Location: PACU and Endoscopy Unit  Anesthesia Type:MAC  Level of Consciousness: sedated  Airway & Oxygen Therapy: Patient Spontanous Breathing and Patient connected to face mask oxygen  Post-op Assessment: Report given to RN and Post -op Vital signs reviewed and stable  Post vital signs: Reviewed and stable  Last Vitals:  Vitals Value Taken Time  BP    Temp    Pulse    Resp    SpO2      Last Pain:  Vitals:   11/17/20 1013  TempSrc: Oral  PainSc: 0-No pain         Complications: No notable events documented.

## 2020-11-17 NOTE — H&P (Signed)
HPI: Cassie [name] is a 60 year old female with a longstanding history of ulcerative colitis diagnosed around age 55, intolerance to multiple prior colitis treatments not on therapy currently, B12 deficiency, arthritis, obesity, prior DVT and PE on Eliquis, sleep apnea who presents for outpatient surveillance colonoscopy.  Seen in the office in April by Alonza Bogus, PA-C  Some intermittent crampy abdominal pain and loose stool.  Occasional scant red blood in stool.  Tolerated the prep.  Last colonoscopy 2017.  Past Medical History:  Diagnosis Date   Adrenal nodule ([place])    Allergy    Anemia    in her early twenties   Anxiety    Arthritis    knees,thumbs   Cardiac arrhythmia    palpitations,PVC'sAC   Cataract    bilateral removed    Complication of anesthesia    BP dropped with a colonoscopy in [place]. no problems since.   Cyst of kidney, acquired    cyst right kidney- benign   Degenerative disc disease, lumbar    Dyspnea    Dysrhythmia    palpitations   Edema    Gallstones    History of anemia    20 yrs. ago not now- pt denies    History of hiatal hernia    IUD (intrauterine device) in place    Morbid obesity ([place])    Osteoarthritis    Other allergy, other than to medicinal agents    Panic disorder    Pulmonary embolism ([place])    Run of atrial premature complexes    Ulcerative colitis, unspecified    Vitamin B12 deficiency    Vitamin D deficiency    Wheelchair bound     Past Surgical History:  Procedure Laterality Date   BREAST BIOPSY  11/2011   Left- benign   CATARACT EXTRACTION Right    CATARACT EXTRACTION Left    CHOLECYSTECTOMY N/A 01/17/2014   Procedure: LAPAROSCOPIC CHOLECYSTECTOMY;  Surgeon: Excell Seltzer, MD;  [facility];  Service: General;  Laterality: N/A;   COLONOSCOPY     DILATATION & CURETTAGE/HYSTEROSCOPY WITH MYOSURE N/A 12/23/2019   Procedure: DILATATION & CURETTAGE, HYSTEROSCOPY WITH MYOSURE, RESECTION OF POLYPOID TISSUE;  Surgeon: Megan Salon, MD;  Location: [place];  Service: Gynecology;  Laterality: N/A;   HYSTEROSCOPY WITH D & C N/A 07/27/2020   Procedure: DILATATION AND CURETTAGE /HYSTEROSCOPY;  Surgeon: [name], MD;  Location: [place];  Service: Gynecology;  Laterality: N/A;   INTRAUTERINE DEVICE (IUD) INSERTION N/A 07/27/2020   Procedure: INTRAUTERINE DEVICE (IUD) INSERTION MIRENA;  Surgeon: [name], MD;  Location: [place];  Service: Gynecology;  Laterality: N/A;   RETINAL LASER PROCEDURE Left    SIGMOIDOSCOPY      (Not in an outpatient encounter)   Allergies  Allergen Reactions   Doxycycline Palpitations   Hydrocodone Other (See Comments)    Hallucinations    Sudafed [Pseudoephedrine Hcl] Shortness Of Breath   Chlorhexidine Gluconate Itching    Burning   Codeine Other (See Comments)   Diphenhydramine Hcl Swelling    Throat feels like it swells    Lactose Intolerance (Gi) Diarrhea and Other (See Comments)    bloating   Mesalamine Other (See Comments)    Hair loss   Other Other (See Comments)    Fillers in some medication/ Heart palpitations Gi upset   Sulfasalazine Diarrhea   Azithromycin Palpitations    Speeding heart rate per pt.    Caffeine Palpitations   Penicillins Rash  Has patient had a PCN reaction causing immediate rash, facial/tongue/throat swelling, SOB or lightheadedness with hypotension: Yes Has patient had a PCN reaction causing severe rash involving mucus membranes or skin necrosis: No Has patient had a PCN reaction that required hospitalization Yes Has patient had a PCN reaction occurring within the last 10 years: No If all of the above answers are "NO", then may proceed with Cephalosporin use.   Prednisone Palpitations    Family History  Problem Relation Age of Onset   Heart disease Father 66       pacemaker   Atrial fibrillation Father    Prostate cancer Father    Hypertension Maternal Aunt    Endometrial cancer Maternal Aunt    Hypertension  Maternal Grandfather    Colon cancer Neg Hx    Stomach cancer Neg Hx    Colon polyps Neg Hx    Esophageal cancer Neg Hx    Rectal cancer Neg Hx    Ovarian cancer Neg Hx    Pancreatic cancer Neg Hx    Breast cancer Neg Hx     Social History   Tobacco Use   Smoking status: Former    Packs/day: 2.00    Years: 5.00    Pack years: 10.00    Types: Cigarettes    Quit date: 03/07/1979    Years since quitting: 41.7   Smokeless tobacco: Never  Vaping Use   Vaping Use: Never used  Substance Use Topics   Alcohol use: No    Alcohol/week: 0.0 standard drinks   Drug use: No    ROS: As per history of present illness, otherwise negative  BP (!) 165/98   Pulse 86   Temp 98 F (36.7 C) (Oral)   Resp 19   Ht 5' 5"  (1.651 m)   Wt (!) 146 kg   LMP 05/09/2010 (Approximate)   SpO2 100%   BMI 53.56 kg/m  Gen: awake, alert, NAD HEENT: anicteric CV: RRR, no mrg Pulm: CTA b/l Abd: soft, NT/ND, +BS throughout Ext: no c/c/e Neuro: nonfocal   RELEVANT LABS AND IMAGING: CBC    Component Value Date/Time   WBC 7.8 08/18/2020 0903   WBC 7.9 07/27/2020 0946   RBC 4.27 08/18/2020 0903   HGB 13.1 08/18/2020 0903   HGB 14.4 03/06/2020 0847   HGB 13.2 01/24/2017 0929   HCT 41.0 08/18/2020 0903   HCT 45.1 03/06/2020 0847   HCT 40.9 01/24/2017 0929   PLT 257 08/18/2020 0903   PLT 268 03/06/2020 0847   MCV 96.0 08/18/2020 0903   MCV 96 03/06/2020 0847   MCV 94 01/24/2017 0929   MCH 30.7 08/18/2020 0903   MCHC 32.0 08/18/2020 0903   RDW 13.6 08/18/2020 0903   RDW 12.5 03/06/2020 0847   RDW 14.6 01/24/2017 0929   LYMPHSABS 1.5 08/18/2020 0903   LYMPHSABS 1.8 03/06/2020 0847   LYMPHSABS 1.8 01/24/2017 0929   MONOABS 0.5 08/18/2020 0903   EOSABS 0.1 08/18/2020 0903   EOSABS 0.2 03/06/2020 0847   EOSABS 0.2 01/24/2017 0929   BASOSABS 0.0 08/18/2020 0903   BASOSABS 0.0 03/06/2020 0847   BASOSABS 0.0 01/24/2017 0929    CMP     Component Value Date/Time   NA 141 08/18/2020  0903   NA 139 01/24/2017 0929   K 4.2 08/18/2020 0903   K 4.0 01/24/2017 0929   CL 105 08/18/2020 0903   CL 103 10/10/2016 1403   CL 105 09/09/2016 1332   CO2 30 08/18/2020 0903  CO2 24 01/24/2017 0929   GLUCOSE 115 (H) 08/18/2020 0903   GLUCOSE 100 01/24/2017 0929   GLUCOSE 92 09/09/2016 1332   BUN 7 (L) 08/18/2020 0903   BUN 9.5 01/24/2017 0929   CREATININE 0.87 08/18/2020 0903   CREATININE 0.86 01/31/2020 0835   CREATININE 0.8 01/24/2017 0929   CALCIUM 9.2 08/18/2020 0903   CALCIUM 9.7 01/24/2017 0929   PROT 6.3 (L) 08/18/2020 0903   PROT 7.6 01/24/2017 0929   ALBUMIN 3.3 (L) 08/18/2020 0903   ALBUMIN 3.2 (L) 01/24/2017 0929   AST 14 (L) 08/18/2020 0903   AST 18 01/24/2017 0929   ALT 15 08/18/2020 0903   ALT 16 01/24/2017 0929   ALKPHOS 51 08/18/2020 0903   ALKPHOS 107 01/24/2017 0929   BILITOT 0.4 08/18/2020 0903   BILITOT 0.31 01/24/2017 0929   GFRNONAA >60 08/18/2020 0903   GFRAA >60 01/07/2019 0749    ASSESSMENT/PLAN: 60 year old female with a longstanding history of ulcerative colitis diagnosed around age 17, intolerance to multiple prior colitis treatments not on therapy currently, B12 deficiency, arthritis, obesity, prior DVT and PE on Eliquis, sleep apnea who presents for outpatient surveillance colonoscopy.  Longstanding UC --overdue for surveillance.  Surveillance colonoscopy today with monitored anesthesia care.  Eliquis has been on hold. The nature of the procedure, as well as the risks, benefits, and alternatives were carefully and thoroughly reviewed with the patient. Ample time for discussion and questions allowed. The patient understood, was satisfied, and agreed to proceed.      Cc:No referring provider defined for this encounter.

## 2020-11-17 NOTE — Telephone Encounter (Signed)
Received call from patient stating that she had a 20 mm polyp removed during a routine colonoscopy today.  Dr Hilarie Fredrickson suggested waiting 48 hours to restart Eliquis.  Dr Marin Olp notified and is ok with this.  Patient notified.

## 2020-11-17 NOTE — Op Note (Addendum)
[facility] Patient Name: Cassie [name] Procedure Date: 11/17/2020 MRN: [id number] Attending MD: Jerene Bears , MD Date of Birth: Apr 20, 1961 CSN: [id number] Age: 60 Admit Type: Outpatient Procedure:                Colonoscopy Indications:              High risk colon cancer surveillance: Ulcerative                            pancolitis of 8 (or more) years duration, Last                            colonoscopy: July 2017 Providers:                Lajuan Lines. Hilarie Fredrickson, MD, Baird [facility], RN, Laverda Sorenson,                            Technician, Stephanie [place] CRNA Referring MD:             Real Cons. Crawford MD, MD Medicines:                Monitored Anesthesia Care Complications:            No immediate complications. Estimated Blood Loss:     Estimated blood loss was minimal. Procedure:                Pre-Anesthesia Assessment:                           - Prior to the procedure, a History and Physical                            was performed, and patient medications and                            allergies were reviewed. The patient's tolerance of                            previous anesthesia was also reviewed. The risks                            and benefits of the procedure and the sedation                            options and risks were discussed with the patient.                            All questions were answered, and informed consent                            was obtained. Prior Anticoagulants: The patient has                            taken Eliquis (apixaban), last dose was 2 days  prior to procedure. ASA Grade Assessment: III - A                            patient with severe systemic disease. After                            reviewing the risks and benefits, the patient was                            deemed in satisfactory condition to undergo the                            procedure.                           After  obtaining informed consent, the colonoscope                            was passed under direct vision. Throughout the                            procedure, the patient's blood pressure, pulse, and                            oxygen saturations were monitored continuously. The                            CF-HQ190L ([id number]) Olympus colonoscope was                            introduced through the anus and advanced to the                            ileocecal valve. The colonoscopy was performed                            without difficulty. The patient tolerated the                            procedure well. The quality of the bowel                            preparation was good. The terminal ileum, ileocecal                            valve, appendiceal orifice, and rectum were                            photographed. Scope In: 10:40:38 AM Scope Out: 11:14:52 AM Scope Withdrawal Time: 0 hours 32 minutes 37 seconds  Total Procedure Duration: 0 hours 34 minutes 14 seconds  Findings:      The digital rectal exam was normal.      The terminal ileum appeared normal.      A 4 mm polyp was found in the cecum. The  polyp was sessile. The polyp       was removed with a cold snare. Resection and retrieval were complete       (Jar 1).      A 6 mm polyp was found in the transverse colon. The polyp was sessile.       The polyp was removed with a cold snare. Resection and retrieval were       complete (Jar 3).      A 12 mm polyp was found in the sigmoid colon. The polyp was sessile. The       polyp was removed with a cold snare. Resection and retrieval were       complete (Jar 6).      A 20 mm polyp was found in the recto-sigmoid colon. The polyp was       semi-sessile. The polyp was removed with a hot snare. Resection and       retrieval were complete (Jar 7). Area immediately distal to the       polypectomy site (and on the same wall) was tattooed with an injection       of 2 mL of Spot (carbon  black).      Inflammation was found in a continuous and circumferential pattern from       the rectum to the splenic flexure. This was graded as Mayo Score 1       (mild, with erythema, decreased vascular pattern, mild friability). Four       biopsies were taken every 10 cm with a cold forceps from the entire       colon for dysplasia surveillance and ulcerative colitis surveillance.       These biopsy specimens from the right colon (cecum/ascending/prox       transverse = Jar 2), distal transverse/descending colon (Jar 4) and       sigmoid colon/rectum (Jar 5) were sent to Pathology.      Internal hemorrhoids were found during retroflexion. The hemorrhoids       were small. Impression:               - The examined portion of the ileum was normal.                           - One 4 mm polyp in the cecum, removed with a cold                            snare. Resected and retrieved.                           - One 6 mm polyp in the transverse colon, removed                            with a cold snare. Resected and retrieved.                           - One 12 mm polyp in the sigmoid colon, removed                            with a cold snare. Resected and retrieved.                           -  One 20 mm polyp at the recto-sigmoid colon,                            removed with a hot snare. Resected and retrieved.                            Tattooed.                           - Mild (Mayo Score 1) ulcerative colitis. Biopsied.                           - Small internal hemorrhoids. Moderate Sedation:      N/A Recommendation:           - Patient has a contact number available for                            emergencies. The signs and symptoms of potential                            delayed complications were discussed with the                            patient. Return to normal activities tomorrow.                            Written discharge instructions were provided to the                             patient.                           - Resume previous diet.                           - Continue present medications.                           - Resume Eliquis (apixaban) at prior dose in 2                            days. Refer to managing physician for further                            adjustment of therapy.                           - No aspirin, ibuprofen, naproxen, or other                            non-steroidal anti-inflammatory drugs after polyp                            removal.                           -  Await pathology results.                           - Office follow-up is recommended with me to                            discuss therapy for ulcerative colitis and make                            decisions on surveillance given long-standing                            nature or disease and polyps found and removed                            today.                           - Repeat colonoscopy is recommended for                            surveillance. The colonoscopy date will be                            determined after pathology results from today's                            exam become available for review. Procedure Code(s):        --- Professional ---                           [phone], Colonoscopy, flexible; with removal of                            tumor(s), polyp(s), or other lesion(s) by snare                            technique                           45381, Colonoscopy, flexible; with directed                            submucosal injection(s), any substance                           97948, 61, Colonoscopy, flexible; with biopsy,                            single or multiple Diagnosis Code(s):        --- Professional ---                           K51.00, Ulcerative (chronic) pancolitis without                            complications  K64.8, Other hemorrhoids                           K63.5, Polyp of colon CPT  copyright 2019 American Medical Association. All rights reserved. The codes documented in this report are preliminary and upon coder review may  be revised to meet current compliance requirements. Jerene Bears, MD 11/17/2020 11:36:19 AM This report has been signed electronically. Number of Addenda: 0

## 2020-11-17 NOTE — Anesthesia Preprocedure Evaluation (Addendum)
Anesthesia Evaluation  Patient identified by MRN, date of birth, ID band Patient awake    Reviewed: Allergy & Precautions, NPO status , Patient's Chart, lab work & pertinent test results, reviewed documented beta blocker date and time   Airway Mallampati: I  TM Distance: >3 FB Neck ROM: Full    Dental  (+) Teeth Intact, Dental Advisory Given   Pulmonary shortness of breath, asthma , former smoker, PE   Pulmonary exam normal breath sounds clear to auscultation       Cardiovascular + CAD  Normal cardiovascular exam+ dysrhythmias Supra Ventricular Tachycardia  Rhythm:Regular Rate:Normal  TTE 2021 1. Left ventricular ejection fraction, by estimation, is 60 to 65%. The  left ventricle has normal function. The left ventricle has no regional  wall motion abnormalities. Left ventricular diastolic parameters are  consistent with Grade I diastolic  dysfunction (impaired relaxation).  2. Right ventricular systolic function is normal. The right ventricular  size is normal. Tricuspid regurgitation signal is inadequate for assessing  PA pressure.  3. Left atrial size was mildly dilated.   Neuro/Psych  Headaches, PSYCHIATRIC DISORDERS Anxiety    GI/Hepatic Neg liver ROS, hiatal hernia, PUD, GERD  Controlled,  Endo/Other  Morbid obesity (BMI 54)  Renal/GU negative Renal ROS  negative genitourinary   Musculoskeletal  (+) Arthritis ,   Abdominal   Peds  Hematology  (+) Blood dyscrasia (on eliquis), ,   Anesthesia Other Findings Colonoscopy for UC/polyps  Reproductive/Obstetrics                           Anesthesia Physical Anesthesia Plan  ASA: 3  Anesthesia Plan: MAC   Post-op Pain Management:    Induction: Intravenous  PONV Risk Score and Plan: Propofol infusion and Treatment may vary due to age or medical condition  Airway Management Planned: Natural Airway  Additional Equipment:    Intra-op Plan:   Post-operative Plan:   Informed Consent: I have reviewed the patients History and Physical, chart, labs and discussed the procedure including the risks, benefits and alternatives for the proposed anesthesia with the patient or authorized representative who has indicated his/her understanding and acceptance.     Dental advisory given  Plan Discussed with: CRNA  Anesthesia Plan Comments:         Anesthesia Quick Evaluation

## 2020-11-17 NOTE — Discharge Instructions (Addendum)
Hold your Eliquis for an additional 48 hours given polyps removed today.  YOU HAD AN ENDOSCOPIC PROCEDURE TODAY: Refer to the procedure report and other information in the discharge instructions given to you for any specific questions about what was found during the examination. If this information does not answer your questions, please call [place] office at [phone] to clarify.   YOU SHOULD EXPECT: Some feelings of bloating in the abdomen. Passage of more gas than usual. Walking can help get rid of the air that was put into your GI tract during the procedure and reduce the bloating. If you had a lower endoscopy (such as a colonoscopy or flexible sigmoidoscopy) you may notice spotting of blood in your stool or on the toilet paper. Some abdominal soreness may be present for a day or two, also.  DIET: Your first meal following the procedure should be a light meal and then it is ok to progress to your normal diet. A half-sandwich or bowl of soup is an example of a good first meal. Heavy or fried foods are harder to digest and may make you feel nauseous or bloated. Drink plenty of fluids but you should avoid alcoholic beverages for 24 hours. If you had a esophageal dilation, please see attached instructions for diet.    ACTIVITY: Your care partner should take you home directly after the procedure. You should plan to take it easy, moving slowly for the rest of the day. You can resume normal activity the day after the procedure however YOU SHOULD NOT DRIVE, use power tools, machinery or perform tasks that involve climbing or major physical exertion for 24 hours (because of the sedation medicines used during the test).   SYMPTOMS TO REPORT IMMEDIATELY: A gastroenterologist can be reached at any hour. Please call [phone]  for any of the following symptoms:  Following lower endoscopy (colonoscopy, flexible sigmoidoscopy) Excessive amounts of blood in the stool  Significant tenderness, worsening of  abdominal pains  Swelling of the abdomen that is new, acute  Fever of 100 or higher  Following upper endoscopy (EGD, EUS, ERCP, esophageal dilation) Vomiting of blood or coffee ground material  New, significant abdominal pain  New, significant chest pain or pain under the shoulder blades  Painful or persistently difficult swallowing  New shortness of breath  Black, tarry-looking or red, bloody stools  FOLLOW UP:  If any biopsies were taken you will be contacted by phone or by letter within the next 1-3 weeks. Call [phone]  if you have not heard about the biopsies in 3 weeks.  Please also call with any specific questions about appointments or follow up tests.

## 2020-11-17 NOTE — Anesthesia Postprocedure Evaluation (Signed)
Anesthesia Post Note  Patient: Cassie [name]  Procedure(s) Performed: COLONOSCOPY WITH PROPOFOL BIOPSY POLYPECTOMY SUBMUCOSAL TATTOO INJECTION     Patient location during evaluation: Endoscopy Anesthesia Type: MAC Level of consciousness: awake and alert Pain management: pain level controlled Vital Signs Assessment: post-procedure vital signs reviewed and stable Respiratory status: spontaneous breathing, nonlabored ventilation, respiratory function stable and patient connected to nasal cannula oxygen Cardiovascular status: blood pressure returned to baseline and stable Postop Assessment: no apparent nausea or vomiting Anesthetic complications: no   No notable events documented.  Last Vitals:  Vitals:   11/17/20 1140 11/17/20 1150  BP: (!) 159/80 (!) 167/84  Pulse: 70 (!) 54  Resp: 13 18  Temp:    SpO2: 96% 99%    Last Pain:  Vitals:   11/17/20 1150  TempSrc:   PainSc: 0-No pain                 Lanny Lipkin L Isadore Bokhari

## 2020-11-18 LAB — SURGICAL PATHOLOGY

## 2020-11-18 NOTE — Telephone Encounter (Signed)
Not sure how to get "added back" as a provider for [employer].  I should be listed as I am her GI physician. I am not sure how to make this happen in my chart JMP

## 2020-11-26 ENCOUNTER — Encounter: Payer: Self-pay | Admitting: Internal Medicine

## 2020-11-26 MED ORDER — MOLNUPIRAVIR EUA 200MG CAPSULE: 4.0000 | ORAL_CAPSULE | Freq: Two times a day (BID) | ORAL | 0 refills | Status: AC

## 2020-12-02 ENCOUNTER — Encounter: Payer: Self-pay | Admitting: Obstetrics and Gynecology

## 2020-12-07 ENCOUNTER — Encounter: Payer: Self-pay | Admitting: *Deleted

## 2020-12-21 ENCOUNTER — Other Ambulatory Visit: Payer: Self-pay | Admitting: Internal Medicine

## 2020-12-23 ENCOUNTER — Ambulatory Visit: Payer: 59 | Admitting: Gastroenterology

## 2020-12-27 ENCOUNTER — Other Ambulatory Visit: Payer: Self-pay | Admitting: Family

## 2020-12-28 ENCOUNTER — Encounter: Payer: Self-pay | Admitting: Internal Medicine

## 2020-12-30 ENCOUNTER — Encounter: Payer: Self-pay | Admitting: Internal Medicine

## 2020-12-30 ENCOUNTER — Telehealth: Payer: Self-pay | Admitting: Internal Medicine

## 2020-12-30 DIAGNOSIS — R7989 Other specified abnormal findings of blood chemistry: Secondary | ICD-10-CM

## 2020-12-30 DIAGNOSIS — Z86711 Personal history of pulmonary embolism: Secondary | ICD-10-CM

## 2020-12-30 DIAGNOSIS — E785 Hyperlipidemia, unspecified: Secondary | ICD-10-CM

## 2020-12-30 DIAGNOSIS — E538 Deficiency of other specified B group vitamins: Secondary | ICD-10-CM

## 2020-12-30 NOTE — Telephone Encounter (Signed)
  Patient Consent for Virtual Visit        Cassie [name] has provided verbal consent on 12/30/2020 for a virtual visit (video or telephone).   CONSENT FOR VIRTUAL VISIT FOR:  Cassie [name]  By participating in this virtual visit I agree to the following:  I hereby voluntarily request, consent and authorize [facility] and its employed or contracted physicians, physician assistants, nurse practitioners or other licensed health care professionals (the Practitioner), to provide me with telemedicine health care services (the "Services") as deemed necessary by the treating Practitioner. I acknowledge and consent to receive the Services by the Practitioner via telemedicine. I understand that the telemedicine visit will involve communicating with the Practitioner through live audiovisual communication technology and the disclosure of certain medical information by electronic transmission. I acknowledge that I have been given the opportunity to request an in-person assessment or other available alternative prior to the telemedicine visit and am voluntarily participating in the telemedicine visit.  I understand that I have the right to withhold or withdraw my consent to the use of telemedicine in the course of my care at any time, without affecting my right to future care or treatment, and that the Practitioner or I may terminate the telemedicine visit at any time. I understand that I have the right to inspect all information obtained and/or recorded in the course of the telemedicine visit and may receive copies of available information for a reasonable fee.  I understand that some of the potential risks of receiving the Services via telemedicine include:  Delay or interruption in medical evaluation due to technological equipment failure or disruption; Information transmitted may not be sufficient (e.g. poor resolution of images) to allow for appropriate medical decision making by the Practitioner;  and/or  In rare instances, security protocols could fail, causing a breach of personal health information.  Furthermore, I acknowledge that it is my responsibility to provide information about my medical history, conditions and care that is complete and accurate to the best of my ability. I acknowledge that Practitioner's advice, recommendations, and/or decision may be based on factors not within their control, such as incomplete or inaccurate data provided by me or distortions of diagnostic images or specimens that may result from electronic transmissions. I understand that the practice of medicine is not an exact science and that Practitioner makes no warranties or guarantees regarding treatment outcomes. I acknowledge that a copy of this consent can be made available to me via my patient portal ([place]), or I can request a printed copy by calling the office of [place] [facility].    I understand that my insurance will be billed for this visit.   I have read or had this consent read to me. I understand the contents of this consent, which adequately explains the benefits and risks of the Services being provided via telemedicine.  I have been provided ample opportunity to ask questions regarding this consent and the Services and have had my questions answered to my satisfaction. I give my informed consent for the services to be provided through the use of telemedicine in my medical care

## 2021-01-15 ENCOUNTER — Other Ambulatory Visit (INDEPENDENT_AMBULATORY_CARE_PROVIDER_SITE_OTHER): Payer: 59

## 2021-01-15 DIAGNOSIS — E785 Hyperlipidemia, unspecified: Secondary | ICD-10-CM

## 2021-01-15 DIAGNOSIS — R7989 Other specified abnormal findings of blood chemistry: Secondary | ICD-10-CM

## 2021-01-15 DIAGNOSIS — E538 Deficiency of other specified B group vitamins: Secondary | ICD-10-CM

## 2021-01-15 DIAGNOSIS — Z86711 Personal history of pulmonary embolism: Secondary | ICD-10-CM

## 2021-01-15 LAB — CBC WITH DIFFERENTIAL/PLATELET
Basophils Absolute: 0 10*3/uL (ref 0.0–0.1)
Basophils Relative: 0.5 % (ref 0.0–3.0)
Eosinophils Absolute: 0.2 10*3/uL (ref 0.0–0.7)
Eosinophils Relative: 4.4 % (ref 0.0–5.0)
HCT: 39.9 % (ref 36.0–46.0)
Hemoglobin: 12.6 g/dL (ref 12.0–15.0)
Lymphocytes Relative: 33.2 % (ref 12.0–46.0)
Lymphs Abs: 1.7 10*3/uL (ref 0.7–4.0)
MCHC: 31.6 g/dL (ref 30.0–36.0)
MCV: 91.1 fl (ref 78.0–100.0)
Monocytes Absolute: 0.4 10*3/uL (ref 0.1–1.0)
Monocytes Relative: 7.9 % (ref 3.0–12.0)
Neutro Abs: 2.7 10*3/uL (ref 1.4–7.7)
Neutrophils Relative %: 54 % (ref 43.0–77.0)
Platelets: 209 10*3/uL (ref 150.0–400.0)
RBC: 4.38 Mil/uL (ref 3.87–5.11)
RDW: 15.9 % — ABNORMAL HIGH (ref 11.5–15.5)
WBC: 5 10*3/uL (ref 4.0–10.5)

## 2021-01-15 LAB — COMPREHENSIVE METABOLIC PANEL
ALT: 12 U/L (ref 0–35)
AST: 11 U/L (ref 0–37)
Albumin: 3.6 g/dL (ref 3.5–5.2)
Alkaline Phosphatase: 61 U/L (ref 39–117)
BUN: 9 mg/dL (ref 6–23)
CO2: 28 mEq/L (ref 19–32)
Calcium: 9.1 mg/dL (ref 8.4–10.5)
Chloride: 104 mEq/L (ref 96–112)
Creatinine, Ser: 0.78 mg/dL (ref 0.40–1.20)
GFR: 81.74 mL/min (ref >60.00)
Glucose, Bld: 100 mg/dL — ABNORMAL HIGH (ref 70–99)
Potassium: 4.3 mEq/L (ref 3.5–5.1)
Sodium: 139 mEq/L (ref 135–145)
Total Bilirubin: 0.4 mg/dL (ref 0.2–1.2)
Total Protein: 7.2 g/dL (ref 6.0–8.3)

## 2021-01-15 LAB — LIPID PANEL
Cholesterol: 134 mg/dL (ref 0–200)
HDL: 32.1 mg/dL — ABNORMAL LOW (ref >39.00)
LDL Cholesterol: 91 mg/dL (ref 0–99)
NonHDL: 102.26
Total CHOL/HDL Ratio: 4
Triglycerides: 56 mg/dL (ref 0.0–149.0)
VLDL: 11.2 mg/dL (ref 0.0–40.0)

## 2021-01-15 LAB — VITAMIN B12: Vitamin B-12: 187 pg/mL — ABNORMAL LOW (ref 211–911)

## 2021-01-15 LAB — HEMOGLOBIN A1C: Hgb A1c MFr Bld: 5.9 % (ref 4.6–6.5)

## 2021-01-15 LAB — VITAMIN D 25 HYDROXY (VIT D DEFICIENCY, FRACTURES): VITD: 21.78 ng/mL — ABNORMAL LOW (ref 30.00–100.00)

## 2021-01-15 LAB — TSH: TSH: 5.38 u[IU]/mL (ref 0.35–5.50)

## 2021-01-16 LAB — D-DIMER, QUANTITATIVE: D-Dimer, Quant: 0.23 mcg/mL FEU (ref <0.50)

## 2021-01-18 ENCOUNTER — Encounter: Payer: Self-pay | Admitting: Hematology & Oncology

## 2021-01-19 ENCOUNTER — Encounter: Payer: 59 | Admitting: Internal Medicine

## 2021-01-26 ENCOUNTER — Other Ambulatory Visit: Payer: Self-pay | Admitting: Internal Medicine

## 2021-01-27 ENCOUNTER — Other Ambulatory Visit: Payer: Self-pay | Admitting: Family

## 2021-01-28 ENCOUNTER — Ambulatory Visit: Payer: 59 | Admitting: Obstetrics and Gynecology

## 2021-02-04 ENCOUNTER — Encounter: Payer: Self-pay | Admitting: Family

## 2021-02-12 ENCOUNTER — Ambulatory Visit (INDEPENDENT_AMBULATORY_CARE_PROVIDER_SITE_OTHER): Payer: 59 | Admitting: Internal Medicine

## 2021-02-12 ENCOUNTER — Encounter: Payer: Self-pay | Admitting: Internal Medicine

## 2021-02-12 ENCOUNTER — Other Ambulatory Visit: Payer: Self-pay

## 2021-02-12 VITALS — BP 134/80 | HR 62 | Resp 18 | Ht 65.0 in

## 2021-02-12 DIAGNOSIS — R7989 Other specified abnormal findings of blood chemistry: Secondary | ICD-10-CM | POA: Diagnosis not present

## 2021-02-12 DIAGNOSIS — E278 Other specified disorders of adrenal gland: Secondary | ICD-10-CM

## 2021-02-12 DIAGNOSIS — Z0000 Encounter for general adult medical examination without abnormal findings: Secondary | ICD-10-CM | POA: Diagnosis not present

## 2021-02-12 DIAGNOSIS — Z86711 Personal history of pulmonary embolism: Secondary | ICD-10-CM | POA: Diagnosis not present

## 2021-02-12 DIAGNOSIS — E782 Mixed hyperlipidemia: Secondary | ICD-10-CM

## 2021-02-12 MED ORDER — ALPRAZOLAM 1 MG PO TABS: 0.5000 mg | ORAL_TABLET | Freq: Two times a day (BID) | ORAL | 5 refills | Status: DC | PRN

## 2021-02-12 NOTE — Progress Notes (Signed)
   Subjective:   Patient ID: Cassie [name], female    DOB: 05/02/1961, 60 y.o.   MRN: [id number]  HPI The patient is a 60 YO female coming in for physical.   PMH, [place], social history reviewed and updated  Review of Systems  Constitutional:  Positive for activity change.  HENT: Negative.    Eyes: Negative.   Respiratory:  Positive for shortness of breath. Negative for cough and chest tightness.   Cardiovascular:  Negative for chest pain, palpitations and leg swelling.  Gastrointestinal:  Negative for abdominal distention, abdominal pain, constipation, diarrhea, nausea and vomiting.  Musculoskeletal:  Positive for arthralgias and myalgias.  Skin: Negative.   Neurological: Negative.   Psychiatric/Behavioral: Negative.     Objective:  Physical Exam Constitutional:      Appearance: She is well-developed. She is obese.  HENT:     Head: Normocephalic and atraumatic.  Cardiovascular:     Rate and Rhythm: Normal rate and regular rhythm.  Pulmonary:     Effort: Pulmonary effort is normal. No respiratory distress.     Breath sounds: Normal breath sounds. No wheezing or rales.  Abdominal:     General: Bowel sounds are normal. There is no distension.     Palpations: Abdomen is soft.     Tenderness: There is no abdominal tenderness. There is no rebound.  Musculoskeletal:     Cervical back: Normal range of motion.  Skin:    General: Skin is warm and dry.  Neurological:     Mental Status: She is alert and oriented to person, place, and time.     Coordination: Coordination abnormal.     Comments: Wheelchair, can walk short distances    Vitals:   02/12/21 1546  BP: 134/80  Pulse: 62  Resp: 18  SpO2: 98%  Height: 5' 5"  (1.651 m)    This visit occurred during the SARS-CoV-2 public health emergency.  Safety protocols were in place, including screening questions prior to the visit, additional usage of staff PPE, and extensive cleaning of exam room while observing appropriate  contact time as indicated for disinfecting solutions.   Assessment & Plan:

## 2021-02-12 NOTE — Assessment & Plan Note (Signed)
Recent monitoring reviewed with patient during visit.

## 2021-02-12 NOTE — Assessment & Plan Note (Signed)
Stable and does not require further imaging.

## 2021-02-12 NOTE — Assessment & Plan Note (Signed)
Continue with yearly monitoring and recent results reviewed with patient during visit.

## 2021-02-12 NOTE — Assessment & Plan Note (Signed)
Seeing oncology next week. She did stop eliquis 5 days for D and C and felt much improved off this with SOB and myalgia and swelling. We talked about 1/2 dose eliquis trial period of 1 month to see if this might help symptoms and still provide some protection. Recent d-dimer reviewed with patient and is low although she does have risk factors of inactivity and morbid obesity.

## 2021-02-12 NOTE — Assessment & Plan Note (Signed)
Weight overall stable, she is limited in activity making it hard for her to lose weight. Discussed diet and chair exercises.

## 2021-02-12 NOTE — Assessment & Plan Note (Signed)
Flu shot counseled yearly. Covid-19 booster counseled. Shingrix counseled. Tetanus counseled. Colonoscopy up to date. Mammogram up to date, pap smear up to date. Counseled about sun safety and mole surveillance. Counseled about the dangers of distracted driving. Given 10 year screening recommendations.

## 2021-02-15 ENCOUNTER — Encounter: Payer: Self-pay | Admitting: Internal Medicine

## 2021-02-15 ENCOUNTER — Ambulatory Visit (INDEPENDENT_AMBULATORY_CARE_PROVIDER_SITE_OTHER): Payer: 59 | Admitting: Internal Medicine

## 2021-02-15 VITALS — BP 140/70 | HR 62 | Ht 65.0 in | Wt 323.0 lb

## 2021-02-15 DIAGNOSIS — K5100 Ulcerative (chronic) pancolitis without complications: Secondary | ICD-10-CM | POA: Diagnosis not present

## 2021-02-15 MED ORDER — FOLIC ACID 1 MG PO TABS: 1.0000 mg | ORAL_TABLET | Freq: Every day | ORAL | 1 refills | Status: DC

## 2021-02-15 MED ORDER — SULFASALAZINE 500 MG PO TABS: 1000.0000 mg | ORAL_TABLET | Freq: Two times a day (BID) | ORAL | 2 refills | Status: DC

## 2021-02-15 NOTE — Patient Instructions (Addendum)
We have sent the following medications to your pharmacy for you to pick up at your convenience: Sulfasalazine 1 gram twice daily (please return the stool study BEFORE starting this medication)  Folate 1 gram daily  Your provider has requested that you go to the basement level for lab work before leaving today. Press "B" on the elevator. The lab is located at the first door on the left as you exit the elevator.  You will be due for a recall colonoscopy in 11/2021. We will send you a reminder in the mail when it gets closer to that time.  Please follow up with Dr Hilarie Fredrickson in 6 months.  If you are age 72 or older, your body mass index should    be between 23-30. Your Body mass index is 53.75 kg/m. If this is out of the aforementioned range listed, please consider follow up with your Primary Care Provider.  If you are age 16 or younger, your body mass index should be between 19-25. Your Body mass index is 53.75 kg/m. If this is out of the aformentioned range listed, please consider follow up with your Primary Care Provider.  ________________________________________________________  The [facility] providers would like to encourage you to use [facility] to communicate with providers for non-urgent requests or questions.  Due to long hold times on the telephone, sending your provider a message by [facility] may be a faster and more efficient way to get a response.  Please allow 48 business hours for a response.  Please remember that this is for non-urgent requests.   Due to recent changes in healthcare laws, you may see the results of your imaging and laboratory studies on MyChart before your provider has had a chance to review them.  We understand that in some cases there may be results that are confusing or concerning to you. Not all laboratory results come back in the same time frame and the provider may be waiting for multiple results in order to interpret others.  Please give [place] 48 hours in order for your  provider to thoroughly review all the results before contacting the office for clarification of your results.

## 2021-02-15 NOTE — Progress Notes (Signed)
Subjective:    Patient ID: Cassie [name], female    DOB: Feb 10, 1961, 60 y.o.   MRN: [id number]  HPI Cassie [name] is a 60 year old female with longstanding ulcerative colitis (diagnosis age 26), history of DVT and PE on Eliquis, B12 deficiency, arthritis and obesity who is here for follow-up.  She is here today with her husband.  She was last seen by me at the time of her surveillance colonoscopy performed on 11/17/2020.  Surveillance colonoscopy as above revealed 4 mm cecal polyp, 6 mm transverse polyp, 12 mm sigmoid polyp and a 20 mm rectosigmoid polyp.  These were removed.  The cecal polyp was benign and without adenomatous change.  The transverse polyp was inflammatory.  The sigmoid polyp was hyperplastic.  The rectosigmoid polyp was tubular adenoma without high-grade dysplasia.  The examined terminal ileum was normal.  There was continuous inflammation which was mild from the rectum to splenic flexure.  Biopsies at this segment revealed chronic moderately active colitis.  Right colon biopsies were benign without active inflammation.  She reports that since the time of her colonoscopy she is feeling better.  Stools are mostly formed and once per day.  Rarely will she have a second bowel movement near bedtime.  She occasionally has right lower quadrant pain but this is chronic for her and dates back many years.  No blood in stool recently.  She reports occasionally she will see a few "dots" of red blood which is painless in nature but she attributes this more to hemorrhoids.  No heartburn.  No nausea or vomiting.  She continues Eliquis.  She read about Entyvio and other Biologics and remains opposed to starting them over fear of side effects.  She would like to try sulfasalazine again.   Review of Systems As per HPI, otherwise negative  Current Medications, Allergies, Past Medical History, Past Surgical History, Family History and Social History were reviewed in [employer] record.    Objective:   Physical Exam BP 140/70   Pulse 62   Ht 5' 5"  (1.651 m)   Wt (!) 323 lb (146.5 kg)   LMP 05/09/2010 (Approximate)   BMI 53.75 kg/m  Gen: awake, alert, NAD HEENT: anicteric CV: RRR, no mrg Pulm: CTA b/l Abd: soft, obese, nontender, +BS throughout Ext: no c/c/e Neuro: nonfocal  CBC    Component Value Date/Time   WBC 5.0 01/15/2021 0739   RBC 4.38 01/15/2021 0739   HGB 12.6 01/15/2021 0739   HGB 13.1 08/18/2020 0903   HGB 14.4 03/06/2020 0847   HGB 13.2 01/24/2017 0929   HCT 39.9 01/15/2021 0739   HCT 45.1 03/06/2020 0847   HCT 40.9 01/24/2017 0929   PLT 209.0 01/15/2021 0739   PLT 257 08/18/2020 0903   PLT 268 03/06/2020 0847   MCV 91.1 01/15/2021 0739   MCV 96 03/06/2020 0847   MCV 94 01/24/2017 0929   MCH 30.7 08/18/2020 0903   MCHC 31.6 01/15/2021 0739   RDW 15.9 (H) 01/15/2021 0739   RDW 12.5 03/06/2020 0847   RDW 14.6 01/24/2017 0929   LYMPHSABS 1.7 01/15/2021 0739   LYMPHSABS 1.8 03/06/2020 0847   LYMPHSABS 1.8 01/24/2017 0929   MONOABS 0.4 01/15/2021 0739   EOSABS 0.2 01/15/2021 0739   EOSABS 0.2 03/06/2020 0847   EOSABS 0.2 01/24/2017 0929   BASOSABS 0.0 01/15/2021 0739   BASOSABS 0.0 03/06/2020 0847   BASOSABS 0.0 01/24/2017 0929   CMP     Component Value Date/Time  NA 139 01/15/2021 0739   NA 139 01/24/2017 0929   K 4.3 01/15/2021 0739   K 4.0 01/24/2017 0929   CL 104 01/15/2021 0739   CL 103 10/10/2016 1403   CL 105 09/09/2016 1332   CO2 28 01/15/2021 0739   CO2 24 01/24/2017 0929   GLUCOSE 100 (H) 01/15/2021 0739   GLUCOSE 100 01/24/2017 0929   GLUCOSE 92 09/09/2016 1332   BUN 9 01/15/2021 0739   BUN 9.5 01/24/2017 0929   CREATININE 0.78 01/15/2021 0739   CREATININE 0.87 08/18/2020 0903   CREATININE 0.86 01/31/2020 0835   CREATININE 0.8 01/24/2017 0929   CALCIUM 9.1 01/15/2021 0739   CALCIUM 9.7 01/24/2017 0929   PROT 7.2 01/15/2021 0739   PROT 7.6 01/24/2017 0929   ALBUMIN 3.6 01/15/2021 0739    ALBUMIN 3.2 (L) 01/24/2017 0929   AST 11 01/15/2021 0739   AST 14 (L) 08/18/2020 0903   AST 18 01/24/2017 0929   ALT 12 01/15/2021 0739   ALT 15 08/18/2020 0903   ALT 16 01/24/2017 0929   ALKPHOS 61 01/15/2021 0739   ALKPHOS 107 01/24/2017 0929   BILITOT 0.4 01/15/2021 0739   BILITOT 0.4 08/18/2020 0903   BILITOT 0.31 01/24/2017 0929   GFRNONAA >60 08/18/2020 0903   GFRAA >60 01/07/2019 0749        Assessment & Plan:  60 year old female with longstanding ulcerative colitis (diagnosis age 53), history of DVT and PE on Eliquis, B12 deficiency, arthritis and obesity who is here for follow-up.   Longstanding UC --she had moderately active colitis in the left colon from rectum to splenic flexure.  She also had a 20 mm adenomatous polyp in the rectum.  The other polyps removed for either benign or hyperplastic.  I am concerned that she is on no therapy for her colitis and we discussed the risk of colorectal cancer including and flat dysplasia in patients with longstanding UC, particularly those not on therapy with active inflammation.  Despite this she remains opposed to biologic medications over fear of side effects.  I did discuss Entyvio with her as this is got specific but she wishes to avoid anything that would suppress her immune system.  She would like to try sulfasalazine again which she took many years ago.  She has a listed allergy for diarrhea but she states she was having diarrhea before this was started.  She did ask about a marker of inflammation and I think checking a fecal calprotectin is reasonable as a surrogate prior to sulfasalazine therapy. --Fecal calprotectin --Sulfasalazine 1 g twice daily --Folic acid 1 mg daily --Repeat fecal calprotectin about 3 to 4 months after sulfasalazine assuming she tolerates this medication --Follow-up with me in 6 months --Surveillance colonoscopy recommended at the 36-monthinterval which would be July 2023 for surveillance  2.  History of  DVT/PE --continuing on Eliquis under the direction of Dr. EMarin Olp 30 minutes total spent today including patient facing time, coordination of care, reviewing medical history/procedures/pertinent radiology studies, and documentation of the encounter.

## 2021-02-16 ENCOUNTER — Other Ambulatory Visit: Payer: 59

## 2021-02-16 DIAGNOSIS — K5100 Ulcerative (chronic) pancolitis without complications: Secondary | ICD-10-CM

## 2021-02-17 ENCOUNTER — Encounter: Payer: Self-pay | Admitting: Family

## 2021-02-17 ENCOUNTER — Inpatient Hospital Stay: Payer: 59 | Attending: Hematology & Oncology | Admitting: Family

## 2021-02-17 DIAGNOSIS — Z86711 Personal history of pulmonary embolism: Secondary | ICD-10-CM | POA: Diagnosis not present

## 2021-02-17 DIAGNOSIS — I82401 Acute embolism and thrombosis of unspecified deep veins of right lower extremity: Secondary | ICD-10-CM | POA: Diagnosis not present

## 2021-02-17 MED ORDER — APIXABAN 2.5 MG PO TABS: 5.0000 mg | ORAL_TABLET | Freq: Two times a day (BID) | ORAL | 6 refills | Status: DC

## 2021-02-17 MED ORDER — APIXABAN 2.5 MG PO TABS: 2.5000 mg | ORAL_TABLET | Freq: Two times a day (BID) | ORAL | 6 refills | Status: DC

## 2021-02-17 NOTE — Progress Notes (Signed)
Hematology and Oncology Follow Up Visit  Cassie [name] [id number] 09-Dec-1960 60 y.o. 02/17/2021   Principle Diagnosis:  Idiopathic right lower lobe pulmonary embolism Ulcerative colitis   Current Therapy:        ELIQUIS 5 mg by mouth twice a day - reduced to 2.5 mg PO BID on 02/17/2021   Interim History: Cassie [name] had a telephone visit today per her request. Patient confirmed using two means of identification.  She is doing fairly well and has no new complaints at this time.  She has gingivitis and notes bleeding around her gums often. No other blood loss noted. No abnormal bruising, no petechiae.  She has some SOB with exertion as well as occasional palpitations.  No fever, chills, n/v, cough, chest pain, abdominal pain or changes in bowel or bladder habits.  She states that while on a break from Eliquis while having procedures with her gynecologist and intermittent bleeding she noted that her SOB, joint aches and pain and lower extremity swelling seemed much better.  We discussed her reducing her dose to 2.5 mg PO daily and she would like to move forward with this as a trial.   She has had some patches of dry skin on her arms, back and face which she states was diagnosed as keratosis.  She had recent colonoscopy with Dr. Hilarie Fredrickson and had several polyps removed, none malignant. She had discussed starting Entyvio for UC but would like to hold off for now as she is feeling better. She is looking at starting Azulfidine which she states was helpful in the past.  The chronic swelling in her lower extremities is described as stable and unchanged from baseline.  No redness or pain at this time. She does have generalized joint aches and pains that wax and wane. This is unchanged.  No falls or syncope reported.  Appetite is ok and she is doing her best to stay well hydrated. Her weight is 323 lbs.  She has been doing some leg exercises to help with circulation.   ECOG Performance Status: 1  - Symptomatic but completely ambulatory  Medications:  Allergies as of 02/17/2021       Reactions   Doxycycline Palpitations   Hydrocodone Other (See Comments)   Hallucinations    Sudafed [pseudoephedrine Hcl] Shortness Of Breath   Chlorhexidine Gluconate Itching   Burning   Codeine Other (See Comments)   Diphenhydramine Hcl Swelling   Throat feels like it swells    Lactose Intolerance (gi) Diarrhea, Other (See Comments)   bloating   Mesalamine Other (See Comments)   Hair loss   Other Other (See Comments)   Fillers in some medication/ Heart palpitations Gi upset   Azithromycin Palpitations   Speeding heart rate per pt.   Caffeine Palpitations   Penicillins Rash   Has patient had a PCN reaction causing immediate rash, facial/tongue/throat swelling, SOB or lightheadedness with hypotension: Yes Has patient had a PCN reaction causing severe rash involving mucus membranes or skin necrosis: No Has patient had a PCN reaction that required hospitalization Yes Has patient had a PCN reaction occurring within the last 10 years: No If all of the above answers are "NO", then may proceed with Cephalosporin use.   Prednisone Palpitations        Medication List        Accurate as of February 17, 2021 11:59 AM. If you have any questions, ask your nurse or doctor.          acetaminophen  500 MG tablet Commonly known as: TYLENOL Take 1,000 mg by mouth every 6 (six) hours as needed for moderate pain or headache.   ALPRAZolam 1 MG tablet Commonly known as: XANAX Take 0.5-1 tablets (0.5-1 mg total) by mouth 2 (two) times daily as needed for anxiety. TAKE 1/2 TO 1 TABLET BY MOUTH 2 TIMES DAILY AS NEEDED FOR ANXIETY.   atenolol 50 MG tablet Commonly known as: TENORMIN TAKE 1/2 TABLET(25 MG) BY MOUTH TWICE DAILY   Eliquis 5 MG Tabs tablet Generic drug: apixaban TAKE 1 TABLET BY MOUTH TWICE DAILY   folic acid 1 MG tablet Commonly known as: FOLVITE Take 1 tablet (1 mg total) by  mouth daily.   GAS-X PO Take 2 tablets by mouth 4 (four) times daily as needed (For gas.). Do not exceed 6 tablets in a 24 hour period.   Mirena (52 MG) 20 MCG/DAY Iud Generic drug: levonorgestrel 1 each by Intrauterine route once.   PROBIOTIC-10 PO Take 1 tablet by mouth 2 (two) times a week.   sulfaSALAzine 500 MG tablet Commonly known as: Azulfidine Take 2 tablets (1,000 mg total) by mouth 2 (two) times daily.        Allergies:  Allergies  Allergen Reactions   Doxycycline Palpitations   Hydrocodone Other (See Comments)    Hallucinations    Sudafed [Pseudoephedrine Hcl] Shortness Of Breath   Chlorhexidine Gluconate Itching    Burning   Codeine Other (See Comments)   Diphenhydramine Hcl Swelling    Throat feels like it swells    Lactose Intolerance (Gi) Diarrhea and Other (See Comments)    bloating   Mesalamine Other (See Comments)    Hair loss   Other Other (See Comments)    Fillers in some medication/ Heart palpitations Gi upset   Azithromycin Palpitations    Speeding heart rate per pt.    Caffeine Palpitations   Penicillins Rash    Has patient had a PCN reaction causing immediate rash, facial/tongue/throat swelling, SOB or lightheadedness with hypotension: Yes Has patient had a PCN reaction causing severe rash involving mucus membranes or skin necrosis: No Has patient had a PCN reaction that required hospitalization Yes Has patient had a PCN reaction occurring within the last 10 years: No If all of the above answers are "NO", then may proceed with Cephalosporin use.   Prednisone Palpitations    Past Medical History, Surgical history, Social history, and Family History were reviewed and updated.  Review of Systems: All other 10 point review of systems is negative.   Physical Exam:  vitals were not taken for this visit.   Wt Readings from Last 3 Encounters:  02/15/21 (!) 323 lb (146.5 kg)  11/17/20 (!) 321 lb 14 oz (146 kg)  09/11/20 (!) 315 lb (142.9  kg)    Ocular: Sclerae unicteric, pupils equal, round and reactive to light Ear-nose-throat: Oropharynx clear, dentition fair Lymphatic: No cervical or supraclavicular adenopathy Lungs no rales or rhonchi, good excursion bilaterally Heart regular rate and rhythm, no murmur appreciated Abd soft, nontender, positive bowel sounds MSK no focal spinal tenderness, no joint edema Neuro: non-focal, well-oriented, appropriate affect Breasts: Deferred   Lab Results  Component Value Date   WBC 5.0 01/15/2021   HGB 12.6 01/15/2021   HCT 39.9 01/15/2021   MCV 91.1 01/15/2021   PLT 209.0 01/15/2021   Lab Results  Component Value Date   FERRITIN 24 07/07/2020   IRON 72 07/17/2013   IRONPCTSAT 24.5 07/17/2013   Lab Results  Component Value Date   RBC 4.38 01/15/2021   No results found for: Nils Pyle [facility] Lab Results  Component Value Date   IGA 341 05/25/2015   No results found for: Kathrynn Ducking, MSPIKE, SPEI   Chemistry      Component Value Date/Time   NA 139 01/15/2021 0739   NA 139 01/24/2017 0929   K 4.3 01/15/2021 0739   K 4.0 01/24/2017 0929   CL 104 01/15/2021 0739   CL 103 10/10/2016 1403   CL 105 09/09/2016 1332   CO2 28 01/15/2021 0739   CO2 24 01/24/2017 0929   BUN 9 01/15/2021 0739   BUN 9.5 01/24/2017 0929   CREATININE 0.78 01/15/2021 0739   CREATININE 0.87 08/18/2020 0903   CREATININE 0.86 01/31/2020 0835   CREATININE 0.8 01/24/2017 0929      Component Value Date/Time   CALCIUM 9.1 01/15/2021 0739   CALCIUM 9.7 01/24/2017 0929   ALKPHOS 61 01/15/2021 0739   ALKPHOS 107 01/24/2017 0929   AST 11 01/15/2021 0739   AST 14 (L) 08/18/2020 0903   AST 18 01/24/2017 0929   ALT 12 01/15/2021 0739   ALT 15 08/18/2020 0903   ALT 16 01/24/2017 0929   BILITOT 0.4 01/15/2021 0739   BILITOT 0.4 08/18/2020 0903   BILITOT 0.31 01/24/2017 0929       Impression and Plan: Cassie [name] is a pleasant  60 yo caucasian female with history of PE on Eliquis 5 mg PO BID.  As mentioned above, she noted an improvement in several symptoms while off of Eliquis. She does not want to discontinue completely but is will to try maintenance dosing of 2.5 mg PO BID.  He has been making effort to be more active.  D-dimer was down to 0.23.  Follow-up with lab in 6 months. PCP will continue to check labs prior to her visit.  We discussed s/s of thrombotic event which she is familiar with. She will contact our office with any questions or concern and will contact EMS in the event of an emergency. We can certainly see her sooner if needed.   Greater than 50% of her visit was spent counseling and coordinating care.   Lottie Dawson, NP 10/12/202211:59 AM

## 2021-02-18 ENCOUNTER — Other Ambulatory Visit: Payer: 59

## 2021-02-18 ENCOUNTER — Encounter: Payer: Self-pay | Admitting: Family

## 2021-02-18 ENCOUNTER — Ambulatory Visit: Payer: 59 | Admitting: Hematology & Oncology

## 2021-02-19 ENCOUNTER — Telehealth: Payer: Self-pay | Admitting: *Deleted

## 2021-02-19 NOTE — Telephone Encounter (Signed)
Per 02/17/21 los - unable to lvm - mailbox is full - mailed calendar of upcoming appointments

## 2021-02-21 LAB — CALPROTECTIN: Calprotectin: 109 mcg/g

## 2021-02-22 ENCOUNTER — Other Ambulatory Visit: Payer: Self-pay | Admitting: Family

## 2021-02-22 DIAGNOSIS — I82401 Acute embolism and thrombosis of unspecified deep veins of right lower extremity: Secondary | ICD-10-CM

## 2021-02-22 DIAGNOSIS — Z86711 Personal history of pulmonary embolism: Secondary | ICD-10-CM

## 2021-02-22 MED ORDER — APIXABAN 5 MG PO TABS: 5.0000 mg | ORAL_TABLET | Freq: Two times a day (BID) | ORAL | 6 refills | Status: DC

## 2021-02-25 DIAGNOSIS — K5100 Ulcerative (chronic) pancolitis without complications: Secondary | ICD-10-CM

## 2021-02-25 NOTE — Progress Notes (Deleted)
60 y.o. G0P0000 Married {Race/ethnicity:17218} female here for annual exam.    PCP:     Patient's last menstrual period was 05/09/2010 (approximate).           Sexually active: {yes no:314532}  The current method of family planning is IUD.    Exercising: {yes no:314532}  {types:19826} Smoker:  {YES NO:22349}  Health Maintenance: Pap:  06-20-19 neg HPV HR neg, History of abnormal Pap:  {YES NO:22349} MMG:  09-15-20 category a density birads 1:neg Colonoscopy:  11-17-20 BMD:   07-30-19  Result  normal TDaP:  2014 Gardasil:   no HIV: neg 2018 Hep C: neg 2017 Screening Labs:  Hb today: ***, Urine today: ***   reports that she quit smoking about 42 years ago. Her smoking use included cigarettes. She has a 10.00 pack-year smoking history. She has never used smokeless tobacco. She reports that she does not drink alcohol and does not use drugs.  Past Medical History:  Diagnosis Date   Adenomatous colon polyp    Adrenal nodule ([place])    Allergy    Anemia    in her early twenties   Anxiety    Arthritis    knees,thumbs   Cardiac arrhythmia    palpitations,PVC'sAC   Cataract    bilateral removed    Complication of anesthesia    BP dropped with a colonoscopy in [place]. no problems since.   Cyst of kidney, acquired    cyst right kidney- benign   Degenerative disc disease, lumbar    Dyspnea    Dysrhythmia    palpitations   Edema    Gallstones    History of anemia    20 yrs. ago not now- pt denies    History of hiatal hernia    IUD (intrauterine device) in place    Morbid obesity ([place])    Osteoarthritis    Other allergy, other than to medicinal agents    Panic disorder    Pulmonary embolism ([place])    Run of atrial premature complexes    Ulcerative colitis, unspecified    Vitamin B12 deficiency    Vitamin D deficiency    Wheelchair bound     Past Surgical History:  Procedure Laterality Date   BIOPSY  11/17/2020   Procedure: BIOPSY;  Surgeon: Jerene Bears, MD;  Location: [facility]  ENDOSCOPY;  Service: Gastroenterology;;   BREAST BIOPSY  11/2011   Left- benign   CATARACT EXTRACTION Right    CATARACT EXTRACTION Left    CHOLECYSTECTOMY N/A 01/17/2014   Procedure: LAPAROSCOPIC CHOLECYSTECTOMY;  Surgeon: Excell Seltzer, MD;  Location: [facility] ORS;  Service: General;  Laterality: N/A;   COLONOSCOPY     COLONOSCOPY WITH PROPOFOL N/A 11/17/2020   Procedure: COLONOSCOPY WITH PROPOFOL;  Surgeon: Jerene Bears, MD;  Location: [facility] END[facility];  Service: Gastroenterology;  Laterality: N/A;   DILATATION & CURETTAGE/HYSTEROSCOPY WITH MYOSURE N/A 12/23/2019   Procedure: DILATATION & CURETTAGE, HYSTEROSCOPY WITH MYOSURE, RESECTION OF POLYPOID TISSUE;  Surgeon: Megan Salon, MD;  Location: [facility];  Service: Gynecology;  Laterality: N/A;   HYSTEROSCOPY WITH D & C N/A 07/27/2020   Procedure: DILATATION AND CURETTAGE /HYSTEROSCOPY;  Surgeon: [name], MD;  Location: [facility];  Service: Gynecology;  Laterality: N/A;   INTRAUTERINE DEVICE (IUD) INSERTION N/A 07/27/2020   Procedure: INTRAUTERINE DEVICE (IUD) INSERTION MIRENA;  Surgeon: [name], MD;  Location: [facility];  Service: Gynecology;  Laterality: N/A;   POLYPECTOMY  11/17/2020   Procedure:  POLYPECTOMY;  Surgeon: Jerene Bears, MD;  Location: [facility];  Service: Gastroenterology;;   RETINAL LASER PROCEDURE Left    SIGMOIDOSCOPY     SUBMUCOSAL TATTOO INJECTION  11/17/2020   Procedure: SUBMUCOSAL TATTOO INJECTION;  Surgeon: Jerene Bears, MD;  Location: [facility];  Service: Gastroenterology;;    Current Outpatient Medications  Medication Sig Dispense Refill   acetaminophen (TYLENOL) 500 MG tablet Take 1,000 mg by mouth every 6 (six) hours as needed for moderate pain or headache.     ALPRAZolam (XANAX) 1 MG tablet Take 0.5-1 tablets (0.5-1 mg total) by mouth 2 (two) times daily as needed for anxiety. TAKE 1/2 TO 1 TABLET BY MOUTH 2 TIMES DAILY AS NEEDED FOR ANXIETY. 60 tablet 5   apixaban (ELIQUIS) 5 MG TABS  tablet Take 1 tablet (5 mg total) by mouth 2 (two) times daily. 60 tablet 6   atenolol (TENORMIN) 50 MG tablet TAKE 1/2 TABLET(25 MG) BY MOUTH TWICE DAILY 45 tablet 2   folic acid (FOLVITE) 1 MG tablet Take 1 tablet (1 mg total) by mouth daily. (Patient not taking: Reported on 02/17/2021) 90 tablet 1   levonorgestrel (MIRENA, 52 MG,) 20 MCG/24HR IUD 1 each by Intrauterine route once.     Probiotic Product (PROBIOTIC-10 PO) Take 1 tablet by mouth 2 (two) times a week.     Simethicone (GAS-X PO) Take 2 tablets by mouth 4 (four) times daily as needed (For gas.). Do not exceed 6 tablets in a 24 hour period.     sulfaSALAzine (AZULFIDINE) 500 MG tablet Take 2 tablets (1,000 mg total) by mouth 2 (two) times daily. (Patient not taking: Reported on 02/17/2021) 120 tablet 2   No current facility-administered medications for this visit.    Family History  Problem Relation Age of Onset   Heart disease Father 6       pacemaker   Atrial fibrillation Father    Prostate cancer Father    Hypertension Maternal Aunt    Endometrial cancer Maternal Aunt    Hypertension Maternal Grandfather    Colon cancer Neg Hx    Stomach cancer Neg Hx    Colon polyps Neg Hx    Esophageal cancer Neg Hx    Rectal cancer Neg Hx    Ovarian cancer Neg Hx    Pancreatic cancer Neg Hx    Breast cancer Neg Hx     Review of Systems  Exam:   LMP 05/09/2010 (Approximate)     General appearance: alert, cooperative and appears stated age Head: normocephalic, without obvious abnormality, atraumatic Neck: no adenopathy, supple, symmetrical, trachea midline and thyroid normal to inspection and palpation Lungs: clear to auscultation bilaterally Breasts: normal appearance, no masses or tenderness, No nipple retraction or dimpling, No nipple discharge or bleeding, No axillary adenopathy Heart: regular rate and rhythm Abdomen: soft, non-tender; no masses, no organomegaly Extremities: extremities normal, atraumatic, no cyanosis or  edema Skin: skin color, texture, turgor normal. No rashes or lesions Lymph nodes: cervical, supraclavicular, and axillary nodes normal. Neurologic: grossly normal  Pelvic: External genitalia:  no lesions              No abnormal inguinal nodes palpated.              Urethra:  normal appearing urethra with no masses, tenderness or lesions              Bartholins and Skenes: normal  Vagina: normal appearing vagina with normal color and discharge, no lesions              Cervix: no lesions              Pap taken: {yes no:314532} Bimanual Exam:  Uterus:  normal size, contour, position, consistency, mobility, non-tender              Adnexa: no mass, fullness, tenderness              Rectal exam: {yes no:314532}.  Confirms.              Anus:  normal sphincter tone, no lesions  Chaperone was present for exam:  ***  Assessment:   Well woman visit with gynecologic exam.   Plan: Mammogram screening discussed. Self breast awareness reviewed. Pap and HR HPV as above. Guidelines for Calcium, Vitamin D, regular exercise program including cardiovascular and weight bearing exercise.   Follow up annually and prn.   Additional counseling given.  {yes [id number]. _______ minutes face to face time of which over 50% was spent in counseling.    After visit summary provided.

## 2021-02-26 ENCOUNTER — Ambulatory Visit: Payer: 59 | Admitting: Obstetrics and Gynecology

## 2021-03-01 NOTE — Telephone Encounter (Addendum)
-----   Message from Jerene Bears, MD sent at 02/26/2021  5:38 PM EDT ----- See my most recent mychart reply to the pt Repeat fecal cal in 3 months JMP

## 2021-03-04 ENCOUNTER — Encounter: Payer: Self-pay | Admitting: Obstetrics and Gynecology

## 2021-03-09 ENCOUNTER — Ambulatory Visit: Payer: 59 | Admitting: Obstetrics and Gynecology

## 2021-03-10 ENCOUNTER — Telehealth (INDEPENDENT_AMBULATORY_CARE_PROVIDER_SITE_OTHER): Payer: 59 | Admitting: Internal Medicine

## 2021-03-10 ENCOUNTER — Encounter: Payer: Self-pay | Admitting: Internal Medicine

## 2021-03-10 VITALS — BP 124/68 | HR 59 | Ht 65.0 in | Wt 319.0 lb

## 2021-03-10 DIAGNOSIS — R002 Palpitations: Secondary | ICD-10-CM

## 2021-03-10 DIAGNOSIS — Z86711 Personal history of pulmonary embolism: Secondary | ICD-10-CM

## 2021-03-10 DIAGNOSIS — I471 Supraventricular tachycardia: Secondary | ICD-10-CM | POA: Diagnosis not present

## 2021-03-10 DIAGNOSIS — R0609 Other forms of dyspnea: Secondary | ICD-10-CM

## 2021-03-10 NOTE — Progress Notes (Signed)
Virtual Visit via Telephone Note   This visit type was conducted due to national recommendations for restrictions regarding the COVID-19 Pandemic (e.g. social distancing) in an effort to limit this patient's exposure and mitigate transmission in our community.  Due to her co-morbid illnesses, this patient is at least at moderate risk for complications without adequate follow up.  This format is felt to be most appropriate for this patient at this time.  The patient did not have access to video technology/had technical difficulties with video requiring transitioning to audio format only (telephone).  All issues noted in this document were discussed and addressed.  No physical exam could be performed with this format.  Please refer to the patient's chart for her  consent to telehealth for [facility].    Date:  03/10/2021   ID:  Cassie [name], DOB [birth year], MRN [id number] The patient was identified using 2 identifiers.  Patient Location: Home Provider Location: Office/Clinic   PCP:  Cassie Koch, MD   [facility] Providers Cardiologist:  Cassie Bush, MD     Evaluation Performed:  Follow-Up Visit  Chief Complaint: Follow-up palpitations  History of Present Illness:    Cassie [name] is a 60 y.o. female with palpitations with brief PSVT and PVCs, chest pain with myocardial bridging in the mid LAD, pulmonary embolism on chronic anticoagulation with apixaban managed by Dr. Marin [name], ulcerative colitis, vitamin B12 deficiency, vitamin D deficiency, anxiety, and morbid obesity.  We are speaking today for follow-up of her palpitations.  I last saw her in May, at which time her palpitations were stable.  She had noticed a drop in her resting heart rate that prompted her to decrease atenolol to 12.5 mg twice daily (previously on 25 mg every morning and 12.5 mg every afternoon).  She noted stable exertional dyspnea without chest pain.  We did not make any medication changes or  pursue additional testing.  Today, [name] reports that she has been dealing with some sinus congestion over the last few days.  She is tested herself 3 times for COVID-19, always being negative.  She notes having had COVID in July, which she recovered from fairly well.  She feels like her palpitations may have been a little bit more severe recently, even predating her recent URI.  She notes brief bursts of a fast heart rate usually lasting about 5 seconds at a time.  She does not have any anginal chest pain but notes a little bit of soreness in the upper chest and the left shoulder on the left when she wakes up.  She believes this may be related to how she is lying in bed.  Chronic exertional dyspnea is stable.  She is trying to walk a little bit more and recently adopted a dog.  Weight has been fairly stable though she notes some mild fluctuations.  She continues taking atenolol 12.5 mg twice daily, though sometimes she will remove a little extra from her dose if she feels like her heart rate is too low, as she sometimes gets pulse readings in the upper 40s or lower 50s.   Past Medical History:  Diagnosis Date   Adenomatous colon polyp    Adrenal nodule ([place])    Allergy    Anemia    in her early twenties   Anxiety    Arthritis    knees,thumbs   Cardiac arrhythmia    palpitations,PVC'sAC   Cataract    bilateral removed    Complication of  anesthesia    BP dropped with a colonoscopy in [place]. no problems since.   Cyst of kidney, acquired    cyst right kidney- benign   Degenerative disc disease, lumbar    Dyspnea    Dysrhythmia    palpitations   Edema    Gallstones    History of anemia    20 yrs. ago not now- pt denies    History of hiatal hernia    IUD (intrauterine device) in place    Morbid obesity ([place])    Osteoarthritis    Other allergy, other than to medicinal agents    Panic disorder    Pulmonary embolism ([place])    Run of atrial premature complexes    Ulcerative colitis,  unspecified    Vitamin B12 deficiency    Vitamin D deficiency    Wheelchair bound    Past Surgical History:  Procedure Laterality Date   BIOPSY  11/17/2020   Procedure: BIOPSY;  Surgeon: Jerene Bears, MD;  [facility];  Service: Gastroenterology;;   BREAST BIOPSY  11/2011   Left- benign   CATARACT EXTRACTION Right    CATARACT EXTRACTION Left    CHOLECYSTECTOMY N/A 01/17/2014   Procedure: LAPAROSCOPIC CHOLECYSTECTOMY;  Surgeon: Excell Seltzer, MD;  Location: [facility];  Service: General;  Laterality: N/A;   COLONOSCOPY     COLONOSCOPY WITH PROPOFOL N/A 11/17/2020   Procedure: COLONOSCOPY WITH PROPOFOL;  Surgeon: Jerene Bears, MD;  [facility];  Service: Gastroenterology;  Laterality: N/A;   DILATATION & CURETTAGE/HYSTEROSCOPY WITH MYOSURE N/A 12/23/2019   Procedure: DILATATION & CURETTAGE, HYSTEROSCOPY WITH MYOSURE, RESECTION OF POLYPOID TISSUE;  Surgeon: Megan Salon, MD;  Location: [place];  Service: Gynecology;  Laterality: N/A;   HYSTEROSCOPY WITH D & C N/A 07/27/2020   Procedure: DILATATION AND CURETTAGE /HYSTEROSCOPY;  Surgeon: [name], MD;  Location: [place];  Service: Gynecology;  Laterality: N/A;   INTRAUTERINE DEVICE (IUD) INSERTION N/A 07/27/2020   Procedure: INTRAUTERINE DEVICE (IUD) INSERTION MIRENA;  Surgeon: [name], MD;  Location: [place];  Service: Gynecology;  Laterality: N/A;   POLYPECTOMY  11/17/2020   Procedure: POLYPECTOMY;  Surgeon: Jerene Bears, MD;  [facility];  Service: Gastroenterology;;   RETINAL LASER PROCEDURE Left    SIGMOIDOSCOPY     SUBMUCOSAL TATTOO INJECTION  11/17/2020   Procedure: SUBMUCOSAL TATTOO INJECTION;  Surgeon: Jerene Bears, MD;  [facility];  Service: Gastroenterology;;   TOOTH EXTRACTION       Current Meds  Medication Sig   acetaminophen (TYLENOL) 500 MG tablet Take 1,000 mg by mouth every 6 (six) hours as needed for moderate pain or headache.   ALPRAZolam  (XANAX) 1 MG tablet Take 0.5-1 tablets (0.5-1 mg total) by mouth 2 (two) times daily as needed for anxiety. TAKE 1/2 TO 1 TABLET BY MOUTH 2 TIMES DAILY AS NEEDED FOR ANXIETY.   apixaban (ELIQUIS) 5 MG TABS tablet Take 1 tablet (5 mg total) by mouth 2 (two) times daily.   atenolol (TENORMIN) 50 MG tablet Take 12.5 mg by mouth 2 (two) times daily.   guaiFENesin (ROBITUSSIN) 100 MG/5ML liquid Take by mouth every 4 (four) hours as needed for cough or to loosen phlegm.   levonorgestrel (MIRENA, 52 MG,) 20 MCG/24HR IUD 1 each by Intrauterine route once.     Allergies:   Doxycycline, Hydrocodone, Sudafed [pseudoephedrine hcl], Chlorhexidine gluconate, Codeine, Diphenhydramine hcl, Lactose intolerance (gi), Mesalamine, Other, Azithromycin, Caffeine, Penicillins, and  Prednisone   Social History   Tobacco Use   Smoking status: Former    Packs/day: 2.00    Years: 5.00    Pack years: 10.00    Types: Cigarettes    Quit date: 03/07/1979    Years since quitting: 42.0   Smokeless tobacco: Never  Vaping Use   Vaping Use: Never used  Substance Use Topics   Alcohol use: No    Alcohol/week: 0.0 standard drinks   Drug use: No     Family Hx: The patient's family history includes Atrial fibrillation in her father; Endometrial cancer in her maternal aunt; Heart disease (age of onset: 42) in her father; Hypertension in her maternal aunt and maternal grandfather; Prostate cancer in her father. There is no history of Colon cancer, Stomach cancer, Colon polyps, Esophageal cancer, Rectal cancer, Ovarian cancer, Pancreatic cancer, or Breast cancer.  ROS:   Please see the history of present illness.   All other systems reviewed and are negative.   Prior CV studies:   The following studies were reviewed today:  14-day event monitor (06/19/2020): Predominantly sinus rhythm with rare PACs and PVCs as well as brief PSVT lasting up to 8 beats.  TTE (08/16/2019): Normal LV size and wall thickness.  LVEF 60-65% with  grade 1 diastolic dysfunction.  Normal RV size and function.  Mild left ventricular enlargement.  No significant valvular abnormality.   14-day event monitor (06/02/19): Predominantly sinus rhythm with rare PACs and PVCs as well as brief PSVT and a single 5 beat run of NSVT followed by transient accelerated idioventricular rhythm.  Labs/Other Tests and Data Reviewed:    EKG:  No ECG reviewed.  Recent Labs: 01/15/2021: ALT 12; BUN 9; Creatinine, Ser 0.78; Hemoglobin 12.6; Platelets 209.0; Potassium 4.3; Sodium 139; TSH 5.38   Recent Lipid Panel Lab Results  Component Value Date/Time   CHOL 134 01/15/2021 07:39 AM   TRIG 56.0 01/15/2021 07:39 AM   HDL 32.10 (L) 01/15/2021 07:39 AM   CHOLHDL 4 01/15/2021 07:39 AM   LDLCALC 91 01/15/2021 07:39 AM   LDLCALC 117 (H) 01/31/2020 08:20 AM    Wt Readings from Last 3 Encounters:  03/10/21 (!) 319 lb (144.7 kg)  02/15/21 (!) 323 lb (146.5 kg)  11/17/20 (!) 321 lb 14 oz (146 kg)         Objective:    Vital Signs:  BP 124/68 Comment: taken 03/09/21  Pulse (!) 59   Ht 5' 5"  (1.651 m)   Wt (!) 319 lb (144.7 kg)   LMP 05/09/2010 (Approximate)   BMI 53.08 kg/m    VITAL SIGNS:  reviewed  ASSESSMENT & PLAN:    Palpitations and PSVT: Overall, symptoms are fairly stable.  Ms. Ferraiolo prefers to remain on her current dose of atenolol 12.5 mg twice daily, which is reasonable.  Dyspnea on exertion: Chronic and stable.  Continue to work on exercise to help with conditioning and weight loss.  History of pulmonary embolism: Continue apixaban under the direction of Dr. Marin [name].  Morbid obesity: BMI remains greater than 50.  I encouraged Ms. Tappan to continue working on increasing her activity to help her lose weight.  Time:   Today, I have spent 11 minutes with the patient with telehealth technology discussing the above problems.     Medication Adjustments/Labs and Tests Ordered: Current medicines are reviewed at length with the  patient today.  Concerns regarding medicines are outlined above.   Tests Ordered: None   Medication Changes: None.  Follow Up:  In Person in 1 year(s)  Signed, Cassie Bush, MD  03/10/2021 4:43 PM    [place]

## 2021-03-10 NOTE — Patient Instructions (Signed)
Medication Instructions:   Your physician recommends that you continue on your current medications as directed. Please refer to the Current Medication list given to you today.  *If you need a refill on your cardiac medications before your next appointment, please call your pharmacy*   Lab Work:  None ordered  Testing/Procedures:  None ordered   Follow-Up: At [facility], you and your health needs are our priority.  As part of our continuing mission to provide you with exceptional heart care, we have created designated Provider Care Teams.  These Care Teams include your primary Cardiologist (physician) and Advanced Practice Providers (APPs -  Physician Assistants and Nurse Practitioners) who all work together to provide you with the care you need, when you need it.  We recommend signing up for the patient portal called "MyChart".  Sign up information is provided on this After Visit Summary.  MyChart is used to connect with patients for Virtual Visits (Telemedicine).  Patients are able to view lab/test results, encounter notes, upcoming appointments, etc.  Non-urgent messages can be sent to your provider as well.   To learn more about what you can do with MyChart, go to [email or web address].    Your next appointment:   1 year(s)  The format for your next appointment:   In Person  Provider:   You may see Nelva Bush, MD or one of the following Advanced Practice Providers on your designated Care Team:   Murray Hodgkins, NP Christell Faith, PA-C Marrianne Mood, PA-C Cadence Spaulding, [place]

## 2021-04-23 ENCOUNTER — Other Ambulatory Visit: Payer: Self-pay | Admitting: Internal Medicine

## 2021-04-23 NOTE — Telephone Encounter (Signed)
Please call and clarify dosing with patient. We have she reported taking 12.5 mg atenolol BID.

## 2021-04-26 ENCOUNTER — Encounter: Payer: Self-pay | Admitting: Internal Medicine

## 2021-04-30 ENCOUNTER — Encounter: Payer: Self-pay | Admitting: Internal Medicine

## 2021-04-30 ENCOUNTER — Other Ambulatory Visit: Payer: Self-pay | Admitting: Internal Medicine

## 2021-05-04 ENCOUNTER — Other Ambulatory Visit: Payer: Self-pay

## 2021-05-24 ENCOUNTER — Encounter: Payer: Self-pay | Admitting: Family

## 2021-05-24 ENCOUNTER — Other Ambulatory Visit: Payer: Self-pay

## 2021-05-24 ENCOUNTER — Ambulatory Visit (INDEPENDENT_AMBULATORY_CARE_PROVIDER_SITE_OTHER): Payer: 59 | Admitting: Obstetrics and Gynecology

## 2021-05-24 ENCOUNTER — Encounter: Payer: Self-pay | Admitting: Obstetrics and Gynecology

## 2021-05-24 VITALS — BP 130/66 | HR 65

## 2021-05-24 DIAGNOSIS — Z975 Presence of (intrauterine) contraceptive device: Secondary | ICD-10-CM

## 2021-05-24 DIAGNOSIS — R1031 Right lower quadrant pain: Secondary | ICD-10-CM | POA: Diagnosis not present

## 2021-05-24 DIAGNOSIS — R1032 Left lower quadrant pain: Secondary | ICD-10-CM | POA: Diagnosis not present

## 2021-05-24 DIAGNOSIS — Z01419 Encounter for gynecological examination (general) (routine) without abnormal findings: Secondary | ICD-10-CM

## 2021-05-24 NOTE — Progress Notes (Signed)
61 y.o. G0P0000 Married Caucasian female here for annual exam.    Husband is present for the visit today.   Still with occasional lower pelvic discomfort on both sides which recurred a couple of weeks ago.  It does come and go.  Right is more sore than the left.  Sensation almost like she will start a period.   Has long standing back pain.   Wants me to check the right vulva mass? Left vulva is sore.   No vaginal bleeding.  Some clear discharge.   PCP:  Pricilla Holm, MD   Patient's last menstrual period was 05/09/2010 (approximate).           Sexually active: No.  The current method of family planning is IUD--Mirena 07/27/20.    Exercising: No.  The patient does not participate in regular exercise at present. Smoker:  no  Health Maintenance: Pap:  06-20-19 Neg:Neg HR HPV History of abnormal Pap:  no MMG:  09-15-20 3D/Neg/BiRads1 Colonoscopy:  11-17-20 multiple polyps;next 1 year BMD:  12-10-19  Result :Normal TDaP:  05-09-12 Gardasil:   no HIV:07-04-16 NR Hep C:12-25-15 Neg Screening Labs:  PCP   reports that she quit smoking about 42 years ago. Her smoking use included cigarettes. She has a 10.00 pack-year smoking history. She has never used smokeless tobacco. She reports that she does not drink alcohol and does not use drugs.  Past Medical History:  Diagnosis Date   Adenomatous colon polyp    Adrenal nodule ([place])    Allergy    Anemia    in her early twenties   Anxiety    Arthritis    knees,thumbs   Cardiac arrhythmia    palpitations,PVC'sAC   Cataract    bilateral removed    Complication of anesthesia    BP dropped with a colonoscopy in [place]. no problems since.   Cyst of kidney, acquired    cyst right kidney- benign   Degenerative disc disease, lumbar    Dyspnea    Dysrhythmia    palpitations   Edema    Gallstones    History of anemia    20 yrs. ago not now- pt denies    History of hiatal hernia    IUD (intrauterine device) in place    Morbid obesity  ([place])    Osteoarthritis    Other allergy, other than to medicinal agents    Panic disorder    Pulmonary embolism ([place])    Run of atrial premature complexes    Ulcerative colitis, unspecified    Vitamin B12 deficiency    Vitamin D deficiency    Wheelchair bound     Past Surgical History:  Procedure Laterality Date   BIOPSY  11/17/2020   Procedure: BIOPSY;  Surgeon: Jerene Bears, MD;  [facility];  Service: Gastroenterology;;   BREAST BIOPSY  11/2011   Left- benign   CATARACT EXTRACTION Right    CATARACT EXTRACTION Left    CHOLECYSTECTOMY N/A 01/17/2014   Procedure: LAPAROSCOPIC CHOLECYSTECTOMY;  Surgeon: Excell Seltzer, MD;  [facility];  Service: General;  Laterality: N/A;   COLONOSCOPY     COLONOSCOPY WITH PROPOFOL N/A 11/17/2020   Procedure: COLONOSCOPY WITH PROPOFOL;  Surgeon: Jerene Bears, MD;  [facility];  Service: Gastroenterology;  Laterality: N/A;   DILATATION & CURETTAGE/HYSTEROSCOPY WITH MYOSURE N/A 12/23/2019   Procedure: DILATATION & CURETTAGE, HYSTEROSCOPY WITH MYOSURE, RESECTION OF POLYPOID TISSUE;  Surgeon: Megan Salon, MD;  Location: [place];  Service: Gynecology;  Laterality: N/A;   HYSTEROSCOPY WITH D & C N/A 07/27/2020   Procedure: DILATATION AND CURETTAGE /HYSTEROSCOPY;  Surgeon: [name], MD;  Location: [place];  Service: Gynecology;  Laterality: N/A;   INTRAUTERINE DEVICE (IUD) INSERTION N/A 07/27/2020   Procedure: INTRAUTERINE DEVICE (IUD) INSERTION MIRENA;  Surgeon: [name], MD;  Location: [place];  Service: Gynecology;  Laterality: N/A;   POLYPECTOMY  11/17/2020   Procedure: POLYPECTOMY;  Surgeon: Jerene Bears, MD;  [facility];  Service: Gastroenterology;;   RETINAL LASER PROCEDURE Left    SIGMOIDOSCOPY     SUBMUCOSAL TATTOO INJECTION  11/17/2020   Procedure: SUBMUCOSAL TATTOO INJECTION;  Surgeon: Jerene Bears, MD;  [facility];  Service: Gastroenterology;;   TOOTH  EXTRACTION      Current Outpatient Medications  Medication Sig Dispense Refill   acetaminophen (TYLENOL) 500 MG tablet Take 1,000 mg by mouth every 6 (six) hours as needed for moderate pain or headache.     ALPRAZolam (XANAX) 1 MG tablet Take 0.5-1 tablets (0.5-1 mg total) by mouth 2 (two) times daily as needed for anxiety. TAKE 1/2 TO 1 TABLET BY MOUTH 2 TIMES DAILY AS NEEDED FOR ANXIETY. 60 tablet 5   apixaban (ELIQUIS) 5 MG TABS tablet Take 1 tablet (5 mg total) by mouth 2 (two) times daily. 60 tablet 6   atenolol (TENORMIN) 50 MG tablet TAKE 1/2 TABLET(25 MG) BY MOUTH TWICE DAILY 45 tablet 2   levonorgestrel (MIRENA, 52 MG,) 20 MCG/24HR IUD 1 each by Intrauterine route once.     folic acid (FOLVITE) 1 MG tablet Take 1 tablet (1 mg total) by mouth daily. (Patient not taking: Reported on 02/17/2021) 90 tablet 1   sulfaSALAzine (AZULFIDINE) 500 MG tablet Take 2 tablets (1,000 mg total) by mouth 2 (two) times daily. (Patient not taking: Reported on 02/17/2021) 120 tablet 2   No current facility-administered medications for this visit.    Family History  Problem Relation Age of Onset   Heart disease Father 24       pacemaker   Atrial fibrillation Father    Prostate cancer Father    Hypertension Maternal Aunt    Endometrial cancer Maternal Aunt    Hypertension Maternal Grandfather    Colon cancer Neg Hx    Stomach cancer Neg Hx    Colon polyps Neg Hx    Esophageal cancer Neg Hx    Rectal cancer Neg Hx    Ovarian cancer Neg Hx    Pancreatic cancer Neg Hx    Breast cancer Neg Hx     Review of Systems  All other systems reviewed and are negative.  Exam:   BP 130/66    Pulse 65    LMP 05/09/2010 (Approximate)    SpO2 98%     General appearance: alert, cooperative and appears stated age Head: normocephalic, without obvious abnormality, atraumatic Neck: no adenopathy, supple, symmetrical, trachea midline and thyroid normal to inspection and palpation Lungs: clear to auscultation  bilaterally Breasts: normal appearance, no masses or tenderness, No nipple retraction or dimpling, No nipple discharge or bleeding, No axillary adenopathy Heart: regular rate and rhythm Abdomen: soft, non-tender; no masses, no organomegaly Extremities: extremities normal, atraumatic, no cyanosis or edema Skin: skin color, texture, turgor normal. No rashes or lesions Lymph nodes: cervical, supraclavicular, and axillary nodes normal. Neurologic: grossly normal  Pelvic: External genitalia:  multiple sebaceous cysts.  No abnormal inguinal nodes palpated.              Urethra:  normal appearing urethra with no masses, tenderness or lesions              Bartholins and Skenes: normal                 Vagina: normal appearing vagina with normal color and discharge, no lesions              Cervix: no lesions.  Cervical lip seen.  IUD strings noted.               Pap taken: no Bimanual Exam:  Uterus:  normal size, contour, position, consistency, mobility, non-tender              Adnexa: no mass, fullness, tenderness              Rectal exam: yes.  Confirms.              Anus:  normal sphincter tone, no lesions  Chaperone was present for exam:  Lawson Radar., RN  Assessment:   Well woman visit with gynecologic exam. Hx endometrial hyperplasia.  RLQ and LLQ discomfort. Mirena IUD in place. Sebaceous cysts of the vulva.  Reassurance given.  On Eliquis bid.  Hx PE. Hx colitis.   Plan: Mammogram screening discussed. Self breast awareness reviewed. Pap and HR HPV 2025. Guidelines for Calcium, Vitamin D, regular exercise program including cardiovascular and weight bearing exercise. Return for pelvic ultrasound.  Follow up annually and prn.   After visit summary provided.

## 2021-05-24 NOTE — Patient Instructions (Signed)

## 2021-05-27 ENCOUNTER — Telehealth: Payer: Self-pay | Admitting: *Deleted

## 2021-05-27 ENCOUNTER — Ambulatory Visit: Payer: 59 | Admitting: Obstetrics and Gynecology

## 2021-05-27 NOTE — Telephone Encounter (Signed)
-----   Message from Larina Bras, [place] sent at 03/01/2021  9:24 AM EDT ----- Pt needs fecal calprotectin completed around 06/01/21. See 02/25/21 patient mychart message. Orders already in epic. Just need to call and remind patient.

## 2021-05-27 NOTE — Telephone Encounter (Addendum)
FYI: Spoke with patient and informed her she was due for her stool study. Patient states she did not start the Sulfasalazine or Folic Acid yet. She states she was waiting to see if her numbers would come down on their own. If they are high again she will start the medication.

## 2021-05-31 NOTE — Telephone Encounter (Signed)
Inbound call from patient states she will try to pick up stool study this week and turn It in next week

## 2021-06-08 ENCOUNTER — Other Ambulatory Visit: Payer: 59

## 2021-06-08 DIAGNOSIS — K5100 Ulcerative (chronic) pancolitis without complications: Secondary | ICD-10-CM

## 2021-06-11 LAB — CALPROTECTIN, FECAL: Calprotectin, Fecal: 383 ug/g — ABNORMAL HIGH (ref 0–120)

## 2021-06-12 ENCOUNTER — Encounter: Payer: Self-pay | Admitting: Internal Medicine

## 2021-06-17 ENCOUNTER — Telehealth: Payer: Self-pay | Admitting: *Deleted

## 2021-06-17 NOTE — Telephone Encounter (Signed)
Patient scheduled for pelvic ultrasound on 06/22/21 she asked if full bladder is needed for ultrasound. I advised patient no full bladder needed for ultrasound.

## 2021-06-21 NOTE — Telephone Encounter (Signed)
Patient informed. 

## 2021-06-21 NOTE — Telephone Encounter (Signed)
Ok to proceed with pelvic ultrasound in March.  Her pain has been intermittent.  I was able to see her IUD strings.

## 2021-06-21 NOTE — Telephone Encounter (Signed)
Patient called back stating she had to reschedule ultrasound for tomorrow reports having a very bad tooth achy. The dentist office is trying to work her in today. She is now scheduled on 07/27/21 for ultrasound, she wanted to confirm this date is okay with you? States you didn't seem to be concerned that ultrasound should be done ASAP. Ultrasound is scheduled for right/left lower quad pain and Mirena IUD check. Please advise

## 2021-06-22 ENCOUNTER — Other Ambulatory Visit: Payer: 59

## 2021-06-22 ENCOUNTER — Other Ambulatory Visit: Payer: 59 | Admitting: Obstetrics and Gynecology

## 2021-07-27 ENCOUNTER — Other Ambulatory Visit: Payer: 59 | Admitting: Obstetrics and Gynecology

## 2021-07-27 ENCOUNTER — Other Ambulatory Visit: Payer: 59

## 2021-08-17 ENCOUNTER — Encounter: Payer: Self-pay | Admitting: Hematology & Oncology

## 2021-08-18 ENCOUNTER — Other Ambulatory Visit: Payer: 59

## 2021-08-18 ENCOUNTER — Ambulatory Visit: Payer: 59 | Admitting: Hematology & Oncology

## 2021-08-18 ENCOUNTER — Inpatient Hospital Stay: Payer: 59 | Admitting: Hematology & Oncology

## 2021-09-04 ENCOUNTER — Other Ambulatory Visit: Payer: Self-pay | Admitting: Internal Medicine

## 2021-09-09 ENCOUNTER — Other Ambulatory Visit: Payer: 59

## 2021-09-09 ENCOUNTER — Other Ambulatory Visit: Payer: 59 | Admitting: Obstetrics and Gynecology

## 2021-09-15 ENCOUNTER — Other Ambulatory Visit: Payer: Self-pay

## 2021-09-15 DIAGNOSIS — Z86711 Personal history of pulmonary embolism: Secondary | ICD-10-CM

## 2021-09-16 ENCOUNTER — Other Ambulatory Visit: Payer: Self-pay

## 2021-09-16 ENCOUNTER — Encounter: Payer: Self-pay | Admitting: Hematology & Oncology

## 2021-09-16 ENCOUNTER — Inpatient Hospital Stay: Payer: 59

## 2021-09-16 ENCOUNTER — Inpatient Hospital Stay: Payer: 59 | Attending: Hematology & Oncology | Admitting: Hematology & Oncology

## 2021-09-16 VITALS — BP 127/57 | HR 65 | Temp 99.4°F | Resp 19 | Wt 320.0 lb

## 2021-09-16 DIAGNOSIS — Z8616 Personal history of COVID-19: Secondary | ICD-10-CM | POA: Diagnosis not present

## 2021-09-16 DIAGNOSIS — Z993 Dependence on wheelchair: Secondary | ICD-10-CM | POA: Diagnosis not present

## 2021-09-16 DIAGNOSIS — Z86711 Personal history of pulmonary embolism: Secondary | ICD-10-CM | POA: Insufficient documentation

## 2021-09-16 DIAGNOSIS — Z7901 Long term (current) use of anticoagulants: Secondary | ICD-10-CM | POA: Insufficient documentation

## 2021-09-16 DIAGNOSIS — E538 Deficiency of other specified B group vitamins: Secondary | ICD-10-CM | POA: Diagnosis not present

## 2021-09-16 LAB — CBC WITH DIFFERENTIAL (CANCER CENTER ONLY)
Abs Immature Granulocytes: 0.02 10*3/uL (ref 0.00–0.07)
Basophils Absolute: 0 10*3/uL (ref 0.0–0.1)
Basophils Relative: 0 %
Eosinophils Absolute: 0.3 10*3/uL (ref 0.0–0.5)
Eosinophils Relative: 3 %
HCT: 40.2 % (ref 36.0–46.0)
Hemoglobin: 12.6 g/dL (ref 12.0–15.0)
Immature Granulocytes: 0 %
Lymphocytes Relative: 25 %
Lymphs Abs: 2.2 10*3/uL (ref 0.7–4.0)
MCH: 30.5 pg (ref 26.0–34.0)
MCHC: 31.3 g/dL (ref 30.0–36.0)
MCV: 97.3 fL (ref 80.0–100.0)
Monocytes Absolute: 0.5 10*3/uL (ref 0.1–1.0)
Monocytes Relative: 6 %
Neutro Abs: 5.8 10*3/uL (ref 1.7–7.7)
Neutrophils Relative %: 66 %
Platelet Count: 242 10*3/uL (ref 150–400)
RBC: 4.13 MIL/uL (ref 3.87–5.11)
RDW: 14 % (ref 11.5–15.5)
WBC Count: 8.9 10*3/uL (ref 4.0–10.5)
nRBC: 0 % (ref 0.0–0.2)

## 2021-09-16 LAB — CMP (CANCER CENTER ONLY)
ALT: 10 U/L (ref 0–44)
AST: 11 U/L — ABNORMAL LOW (ref 15–41)
Albumin: 3.5 g/dL (ref 3.5–5.0)
Alkaline Phosphatase: 67 U/L (ref 38–126)
Anion gap: 4 — ABNORMAL LOW (ref 5–15)
BUN: 9 mg/dL (ref 8–23)
CO2: 30 mmol/L (ref 22–32)
Calcium: 9.1 mg/dL (ref 8.9–10.3)
Chloride: 108 mmol/L (ref 98–111)
Creatinine: 0.8 mg/dL (ref 0.44–1.00)
GFR, Estimated: >60 mL/min (ref >60)
Glucose, Bld: 124 mg/dL — ABNORMAL HIGH (ref 70–99)
Potassium: 4 mmol/L (ref 3.5–5.1)
Sodium: 142 mmol/L (ref 135–145)
Total Bilirubin: 0.5 mg/dL (ref 0.3–1.2)
Total Protein: 6.6 g/dL (ref 6.5–8.1)

## 2021-09-16 LAB — D-DIMER, QUANTITATIVE: D-Dimer, Quant: <0.27 ug/mL-FEU (ref 0.00–0.50)

## 2021-09-16 NOTE — Progress Notes (Signed)
?Hematology and Oncology Follow Up Visit ? ?Cassie [name] ?[id number] ?Dec 25, 1960 61 y.o. ?09/16/2021 ? ? ?Principle Diagnosis:  ?Idiopathic right lower lobe pulmonary embolism ?Ulcerative colitis ?  ?Current Therapy:        ?ELIQUIS 5 mg by mouth twice a day - reduced to 2.5 mg PO BID on 02/17/2021 ?  ?Interim History: Cassie [name] is coming to the office.  She comes in with her husband.  Unfortunately, she comes in a wheelchair.  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  She probably is not a candidate for any type of surgery.  I think she has seen Orthopedic Surgery in the past. ? ?Her weight has not gone down since she was last here back in October.  I am sure that she is trying her best to lose some weight. ? ?She has had no problems with bleeding.  She has chronic shortness of breath.  She has a little bit of wheezing. ? ?She has had no fever.  She had COVID back in July.  This could certainly be having some long-term effect on her. ? ?She said that when she she is off Eliquis, she feels a little bit better.  She was off Eliquis while she was having a IUD put in.  She says she felt better.  Patient did have a colonoscopy last year.  She has this yearly because of the ulcerative colitis. ? ?She has chronic swelling in her legs.  I am unsure if there is anything I can be done about this. ? ?She has had no rashes. ? ?Overall, I would say performance status is probably ECOG 2.   ? ?Medications:  ?Allergies as of 09/16/2021   ? ?   Reactions  ? Doxycycline Palpitations  ? Hydrocodone Other (See Comments)  ? Hallucinations   ? Sudafed [pseudoephedrine Hcl] Shortness Of Breath  ? Chlorhexidine Gluconate Itching  ? Burning  ? Codeine Other (See Comments)  ? Diphenhydramine Hcl Swelling  ? Throat feels like it swells   ? Lactose Intolerance (gi) Diarrhea, Other (See Comments)  ? bloating  ? Mesalamine Other (See Comments)  ? Hair loss  ? Other Other (See Comments)  ? Fillers in some medication/ Heart  palpitations ?Gi upset  ? Azithromycin Palpitations  ? Speeding heart rate per pt.  ? Caffeine Palpitations  ? Penicillins Rash  ? Has patient had a PCN reaction causing immediate rash, facial/tongue/throat swelling, SOB or lightheadedness with hypotension: Yes ?Has patient had a PCN reaction causing severe rash involving mucus membranes or skin necrosis: No ?Has patient had a PCN reaction that required hospitalization Yes ?Has patient had a PCN reaction occurring within the last 10 years: No ?If all of the above answers are "NO", then may proceed with Cephalosporin use.  ? Prednisone Palpitations  ? ?  ? ?  ?Medication List  ?  ? ?  ? Accurate as of Sep 16, 2021  3:57 PM. If you have any questions, ask your nurse or doctor.  ?  ?  ? ?  ? ?acetaminophen 500 MG tablet ?Commonly known as: TYLENOL ?Take 1,000 mg by mouth every 6 (six) hours as needed for moderate pain or headache. ?  ?ALPRAZolam 1 MG tablet ?Commonly known as: Duanne Moron ?TAKE 1/2 TO 1 TABLET(0.5 TO 1 MG) BY MOUTH TWICE DAILY AS NEEDED FOR ANXIETY ?  ?apixaban 5 MG Tabs tablet ?Commonly known as: Eliquis ?Take 1 tablet (5 mg total) by mouth 2 (two) times daily. ?  ?  atenolol 50 MG tablet ?Commonly known as: TENORMIN ?TAKE 1/2 TABLET(25 MG) BY MOUTH TWICE DAILY ?  ?folic acid 1 MG tablet ?Commonly known as: FOLVITE ?Take 1 tablet (1 mg total) by mouth daily. ?  ?Mirena (52 MG) 20 MCG/DAY Iud ?Generic drug: levonorgestrel ?1 each by Intrauterine route once. ?  ?sulfaSALAzine 500 MG tablet ?Commonly known as: Azulfidine ?Take 2 tablets (1,000 mg total) by mouth 2 (two) times daily. ?  ? ?  ? ? ?Allergies:  ?Allergies  ?Allergen Reactions  ? Doxycycline Palpitations  ? Hydrocodone Other (See Comments)  ?  Hallucinations   ? Sudafed [Pseudoephedrine Hcl] Shortness Of Breath  ? Chlorhexidine Gluconate Itching  ?  Burning  ? Codeine Other (See Comments)  ? Diphenhydramine Hcl Swelling  ?  Throat feels like it swells   ? Lactose Intolerance (Gi) Diarrhea and Other  (See Comments)  ?  bloating  ? Mesalamine Other (See Comments)  ?  Hair loss  ? Other Other (See Comments)  ?  Fillers in some medication/ Heart palpitations ?Gi upset  ? Azithromycin Palpitations  ?  Speeding heart rate per pt. ?  ? Caffeine Palpitations  ? Penicillins Rash  ?  Has patient had a PCN reaction causing immediate rash, facial/tongue/throat swelling, SOB or lightheadedness with hypotension: Yes ?Has patient had a PCN reaction causing severe rash involving mucus membranes or skin necrosis: No ?Has patient had a PCN reaction that required hospitalization Yes ?Has patient had a PCN reaction occurring within the last 10 years: No ?If all of the above answers are "NO", then may proceed with Cephalosporin use.  ? Prednisone Palpitations  ? ? ?Past Medical History, Surgical history, Social history, and Family History were reviewed and updated. ? ?Review of Systems: ?Review of Systems  ?Constitutional:  Positive for malaise/fatigue.  ?HENT: Negative.    ?Eyes: Negative.   ?Respiratory:  Positive for shortness of breath.   ?Cardiovascular:  Positive for palpitations and leg swelling.  ?Gastrointestinal: Negative.   ?Genitourinary: Negative.   ?Musculoskeletal:  Positive for back pain, joint pain and myalgias.  ?Skin: Negative.   ?Neurological:  Positive for focal weakness.  ?Endo/Heme/Allergies: Negative.   ?Psychiatric/Behavioral: Negative.    ?  ? ?Physical Exam: ? weight is 320 lb (145.2 kg) (abnormal). Her oral temperature is 99.4 ?F (37.4 ?C). Her blood pressure is 127/57 (abnormal) and her pulse is 65. Her respiration is 19 and oxygen saturation is 100%.  ? ?Wt Readings from Last 3 Encounters:  ?09/16/21 (!) 320 lb (145.2 kg)  ?03/10/21 (!) 319 lb (144.7 kg)  ?02/15/21 (!) 323 lb (146.5 kg)  ? ? ?Physical Exam ?Vitals reviewed.  ?HENT:  ?   Head: Normocephalic and atraumatic.  ?Eyes:  ?   Pupils: Pupils are equal, round, and reactive to light.  ?Cardiovascular:  ?   Rate and Rhythm: Normal rate and regular  rhythm.  ?   Heart sounds: Normal heart sounds.  ?Pulmonary:  ?   Effort: Pulmonary effort is normal.  ?   Breath sounds: Normal breath sounds.  ?   Comments: Lungs sound relatively clear bilaterally.  She does have some wheezes bilaterally. ?Abdominal:  ?   General: Bowel sounds are normal.  ?   Palpations: Abdomen is soft.  ?Musculoskeletal:     ?   General: No tenderness or deformity. Normal range of motion.  ?   Cervical back: Normal range of motion.  ?   Comments:  ?Her extremities shows about 2+ edema in  the legs bilaterally.  ?Lymphadenopathy:  ?   Cervical: No cervical adenopathy.  ?Skin: ?   General: Skin is warm and dry.  ?   Findings: No erythema or rash.  ?Neurological:  ?   Mental Status: She is alert and oriented to person, place, and time.  ?Psychiatric:     ?   Behavior: Behavior normal.     ?   Thought Content: Thought content normal.     ?   Judgment: Judgment normal.  ? ? ? ?Lab Results  ?Component Value Date  ? WBC 8.9 09/16/2021  ? HGB 12.6 09/16/2021  ? HCT 40.2 09/16/2021  ? MCV 97.3 09/16/2021  ? PLT 242 09/16/2021  ? ?Lab Results  ?Component Value Date  ? FERRITIN 24 07/07/2020  ? IRON 72 07/17/2013  ? IRONPCTSAT 24.5 07/17/2013  ? ?Lab Results  ?Component Value Date  ? RBC 4.13 09/16/2021  ? ?No results found for: KPAFRELGTCHN, LAMBDASER, KAPLAMBRATIO ?Lab Results  ?Component Value Date  ? IGA 341 05/25/2015  ? ?No results found for: TOTALPROTELP, ALBUMINELP, A1GS, A2GS, BETS, BETA2SER, GAMS, MSPIKE, SPEI ?  Chemistry   ?   ?Component Value Date/Time  ? NA 142 09/16/2021 1504  ? NA 139 01/24/2017 0929  ? K 4.0 09/16/2021 1504  ? K 4.0 01/24/2017 0929  ? CL 108 09/16/2021 1504  ? CL 103 10/10/2016 1403  ? CL 105 09/09/2016 1332  ? CO2 30 09/16/2021 1504  ? CO2 24 01/24/2017 0929  ? BUN 9 09/16/2021 1504  ? BUN 9.5 01/24/2017 0929  ? CREATININE 0.80 09/16/2021 1504  ? CREATININE 0.86 01/31/2020 0835  ? CREATININE 0.8 01/24/2017 0929  ?    ?Component Value Date/Time  ? CALCIUM 9.1 09/16/2021  1504  ? CALCIUM 9.7 01/24/2017 0929  ? ALKPHOS 67 09/16/2021 1504  ? ALKPHOS 107 01/24/2017 0929  ? AST 11 (L) 09/16/2021 1504  ? AST 18 01/24/2017 0929  ? ALT 10 09/16/2021 1504  ? ALT 16 01/24/2017 0929

## 2021-09-18 ENCOUNTER — Other Ambulatory Visit: Payer: Self-pay | Admitting: Internal Medicine

## 2021-10-03 ENCOUNTER — Other Ambulatory Visit: Payer: Self-pay | Admitting: Family

## 2021-10-03 DIAGNOSIS — I82401 Acute embolism and thrombosis of unspecified deep veins of right lower extremity: Secondary | ICD-10-CM

## 2021-10-03 DIAGNOSIS — Z86711 Personal history of pulmonary embolism: Secondary | ICD-10-CM

## 2021-10-11 ENCOUNTER — Telehealth: Payer: Self-pay | Admitting: *Deleted

## 2021-10-11 NOTE — Telephone Encounter (Signed)
Ok to cancel pelvic ultrasound and recheck appointment.  I recommend yearly GYN visits.

## 2021-10-11 NOTE — Telephone Encounter (Signed)
Patient called to see if she still needs pelvic ultrasound? She is scheduled on 10/14/21 called today reports she is having bilateral leg pain and would prefer to avoid ultrasound if possible. She is no longer c/o lower quad pain as noted in OV on 4/23. She has not noticed any vaginal bleeding, does report light clear vaginal discharge only, no odor, no itching. Please advise

## 2021-10-11 NOTE — Telephone Encounter (Signed)
Patient informed. Pelvic ultrasound canceled.

## 2021-10-14 ENCOUNTER — Other Ambulatory Visit: Payer: 59 | Admitting: Obstetrics and Gynecology

## 2021-10-14 ENCOUNTER — Other Ambulatory Visit: Payer: 59

## 2021-12-15 ENCOUNTER — Telehealth: Payer: Self-pay

## 2021-12-15 NOTE — Telephone Encounter (Signed)
Received VM from pt reporting she has a dental cleaning today and had forgotten to seek advice on holding Eliquis. Pt's last dose was last pm and she has not taken today's AM dose.   Per Lottie Dawson, NP - ok for pt to have teeth cleaned today. Continue to hold today and tomorrow's Eliquis doses and resume medication on Friday 8/11. Pt verbalizes understanding using teachback. This note faxed to Dr Arita Miss at [phone] for clearance. dph

## 2022-01-07 ENCOUNTER — Inpatient Hospital Stay: Payer: 59

## 2022-01-12 ENCOUNTER — Ambulatory Visit: Payer: 59 | Admitting: Family

## 2022-01-14 ENCOUNTER — Encounter: Payer: Self-pay | Admitting: Internal Medicine

## 2022-02-16 LAB — HM MAMMOGRAPHY

## 2022-02-17 ENCOUNTER — Other Ambulatory Visit: Payer: Self-pay | Admitting: Internal Medicine

## 2022-02-21 ENCOUNTER — Encounter: Payer: Self-pay | Admitting: *Deleted

## 2022-02-21 ENCOUNTER — Encounter: Payer: Self-pay | Admitting: Obstetrics and Gynecology

## 2022-03-10 ENCOUNTER — Inpatient Hospital Stay: Payer: 59 | Attending: Hematology & Oncology

## 2022-03-10 DIAGNOSIS — Z7901 Long term (current) use of anticoagulants: Secondary | ICD-10-CM | POA: Insufficient documentation

## 2022-03-10 DIAGNOSIS — Z86711 Personal history of pulmonary embolism: Secondary | ICD-10-CM | POA: Diagnosis not present

## 2022-03-10 DIAGNOSIS — E538 Deficiency of other specified B group vitamins: Secondary | ICD-10-CM

## 2022-03-10 DIAGNOSIS — Z79899 Other long term (current) drug therapy: Secondary | ICD-10-CM | POA: Diagnosis not present

## 2022-03-10 LAB — CBC WITH DIFFERENTIAL (CANCER CENTER ONLY)
Abs Immature Granulocytes: 0.02 10*3/uL (ref 0.00–0.07)
Basophils Absolute: 0 10*3/uL (ref 0.0–0.1)
Basophils Relative: 0 %
Eosinophils Absolute: 0.2 10*3/uL (ref 0.0–0.5)
Eosinophils Relative: 2 %
HCT: 37.9 % (ref 36.0–46.0)
Hemoglobin: 11.9 g/dL — ABNORMAL LOW (ref 12.0–15.0)
Immature Granulocytes: 0 %
Lymphocytes Relative: 24 %
Lymphs Abs: 2.1 10*3/uL (ref 0.7–4.0)
MCH: 31 pg (ref 26.0–34.0)
MCHC: 31.4 g/dL (ref 30.0–36.0)
MCV: 98.7 fL (ref 80.0–100.0)
Monocytes Absolute: 0.7 10*3/uL (ref 0.1–1.0)
Monocytes Relative: 8 %
Neutro Abs: 5.7 10*3/uL (ref 1.7–7.7)
Neutrophils Relative %: 66 %
Platelet Count: 224 10*3/uL (ref 150–400)
RBC: 3.84 MIL/uL — ABNORMAL LOW (ref 3.87–5.11)
RDW: 14.4 % (ref 11.5–15.5)
WBC Count: 8.7 10*3/uL (ref 4.0–10.5)
nRBC: 0 % (ref 0.0–0.2)

## 2022-03-10 LAB — CMP (CANCER CENTER ONLY)
ALT: 10 U/L (ref 0–44)
AST: 9 U/L — ABNORMAL LOW (ref 15–41)
Albumin: 3.6 g/dL (ref 3.5–5.0)
Alkaline Phosphatase: 67 U/L (ref 38–126)
Anion gap: 5 (ref 5–15)
BUN: 9 mg/dL (ref 8–23)
CO2: 30 mmol/L (ref 22–32)
Calcium: 9.1 mg/dL (ref 8.9–10.3)
Chloride: 105 mmol/L (ref 98–111)
Creatinine: 0.76 mg/dL (ref 0.44–1.00)
GFR, Estimated: >60 mL/min (ref >60)
Glucose, Bld: 110 mg/dL — ABNORMAL HIGH (ref 70–99)
Potassium: 3.4 mmol/L — ABNORMAL LOW (ref 3.5–5.1)
Sodium: 140 mmol/L (ref 135–145)
Total Bilirubin: 0.5 mg/dL (ref 0.3–1.2)
Total Protein: 7 g/dL (ref 6.5–8.1)

## 2022-03-10 LAB — D-DIMER, QUANTITATIVE: D-Dimer, Quant: <0.27 ug/mL-FEU (ref 0.00–0.50)

## 2022-03-10 LAB — VITAMIN B12: Vitamin B-12: 201 pg/mL (ref 180–914)

## 2022-03-11 ENCOUNTER — Telehealth: Payer: Self-pay

## 2022-03-11 NOTE — Telephone Encounter (Signed)
-----   Message from Volanda Napoleon, MD sent at 03/10/2022  5:10 PM EDT ----- Call and let her know that the D-dimer is normal.  I am very happy about this.  Hope that she has a wonderful Thanksgiving and Christmas with her family.  Thanks.  Laurey Arrow

## 2022-03-14 ENCOUNTER — Other Ambulatory Visit: Payer: Self-pay

## 2022-03-14 ENCOUNTER — Inpatient Hospital Stay (HOSPITAL_BASED_OUTPATIENT_CLINIC_OR_DEPARTMENT_OTHER): Payer: 59 | Admitting: Family

## 2022-03-14 ENCOUNTER — Encounter: Payer: Self-pay | Admitting: Family

## 2022-03-14 ENCOUNTER — Encounter: Payer: 59 | Admitting: Internal Medicine

## 2022-03-14 DIAGNOSIS — I82401 Acute embolism and thrombosis of unspecified deep veins of right lower extremity: Secondary | ICD-10-CM

## 2022-03-14 DIAGNOSIS — Z86711 Personal history of pulmonary embolism: Secondary | ICD-10-CM | POA: Diagnosis not present

## 2022-03-14 DIAGNOSIS — D649 Anemia, unspecified: Secondary | ICD-10-CM

## 2022-03-14 NOTE — Progress Notes (Addendum)
Hematology and Oncology Follow Up Visit  Cassie [name] [id number] [birth year] 61 y.o. 03/14/2022   Principle Diagnosis:  Idiopathic right lower lobe pulmonary embolism Ulcerative colitis   Current Therapy:        ELIQUIS 5 mg by mouth twice a day   Interim History:  [name] has a telephone visit today for follow-up as this is preferred by patient. Patient identified using two ID markers.  She continues to take her Eliquis 5 mg PO BID.  No obvious blood loss noted. No abnormal bruising, no petechiae.  Stool can be a dark brown at times. No red or black tarry stools.  She has chronic back pain and lower extremity swelling from her knees unchanged from baseline. She is in a wheelchair most of the time now.  No falls or syncope reported.  SOB with any exertion. She has discussed with cardiology.  She notes occasional palpitations.  No fever, chills, n/v, cough, rash, dizziness, chest pain or changes in bladder habits.  Appetite and hydration are improving since she has gotten through her UC flare. Patient states her weight seems to be fairly stable within a 5 lb fluctuation.   ECOG Performance Status: 2 - Symptomatic, <50% confined to bed  Medications:  Allergies as of 03/14/2022       Reactions   Doxycycline Palpitations   Hydrocodone Other (See Comments)   Hallucinations    Sudafed [pseudoephedrine Hcl] Shortness Of Breath   Chlorhexidine Gluconate Itching   Burning   Codeine Other (See Comments)   Diphenhydramine Hcl Swelling   Throat feels like it swells    Lactose Intolerance (gi) Diarrhea, Other (See Comments)   bloating   Mesalamine Other (See Comments)   Hair loss   Other Other (See Comments)   Fillers in some medication/ Heart palpitations Gi upset   Azithromycin Palpitations   Speeding heart rate per pt.   Caffeine Palpitations   Penicillins Rash   Has patient had a PCN reaction causing immediate rash, facial/tongue/throat swelling, SOB or  lightheadedness with hypotension: Yes Has patient had a PCN reaction causing severe rash involving mucus membranes or skin necrosis: No Has patient had a PCN reaction that required hospitalization Yes Has patient had a PCN reaction occurring within the last 10 years: No If all of the above answers are "NO", then may proceed with Cephalosporin use.   Prednisone Palpitations        Medication List        Accurate as of March 14, 2022  2:30 PM. If you have any questions, ask your nurse or doctor.          acetaminophen 500 MG tablet Commonly known as: TYLENOL Take 1,000 mg by mouth every 6 (six) hours as needed for moderate pain or headache.   ALPRAZolam 1 MG tablet Commonly known as: XANAX TAKE 1/2 TO 1 TABLET(0.5 TO 1 MG) BY MOUTH TWICE DAILY AS NEEDED FOR ANXIETY   atenolol 50 MG tablet Commonly known as: TENORMIN TAKE 1/2 TABLET(25 MG) BY MOUTH TWICE DAILY   Eliquis 5 MG Tabs tablet Generic drug: apixaban TAKE 1 TABLET(5 MG) BY MOUTH TWICE DAILY   folic acid 1 MG tablet Commonly known as: FOLVITE Take 1 tablet (1 mg total) by mouth daily.   Mirena (52 MG) 20 MCG/DAY Iud Generic drug: levonorgestrel 1 each by Intrauterine route once.   sulfaSALAzine 500 MG tablet Commonly known as: Azulfidine Take 2 tablets (1,000 mg total) by mouth 2 (two) times daily.  Allergies:  Allergies  Allergen Reactions   Doxycycline Palpitations   Hydrocodone Other (See Comments)    Hallucinations    Sudafed [Pseudoephedrine Hcl] Shortness Of Breath   Chlorhexidine Gluconate Itching    Burning   Codeine Other (See Comments)   Diphenhydramine Hcl Swelling    Throat feels like it swells    Lactose Intolerance (Gi) Diarrhea and Other (See Comments)    bloating   Mesalamine Other (See Comments)    Hair loss   Other Other (See Comments)    Fillers in some medication/ Heart palpitations Gi upset   Azithromycin Palpitations    Speeding heart rate per pt.    Caffeine  Palpitations   Penicillins Rash    Has patient had a PCN reaction causing immediate rash, facial/tongue/throat swelling, SOB or lightheadedness with hypotension: Yes Has patient had a PCN reaction causing severe rash involving mucus membranes or skin necrosis: No Has patient had a PCN reaction that required hospitalization Yes Has patient had a PCN reaction occurring within the last 10 years: No If all of the above answers are "NO", then may proceed with Cephalosporin use.   Prednisone Palpitations    Past Medical History, Surgical history, Social history, and Family History were reviewed and updated.  Review of Systems: All other 10 point review of systems is negative.   Physical Exam:  vitals were not taken for this visit.   Wt Readings from Last 3 Encounters:  09/16/21 (!) 320 lb (145.2 kg)  03/10/21 (!) 319 lb (144.7 kg)  02/15/21 (!) 323 lb (146.5 kg)    Lab Results  Component Value Date   WBC 8.7 03/10/2022   HGB 11.9 (L) 03/10/2022   HCT 37.9 03/10/2022   MCV 98.7 03/10/2022   PLT 224 03/10/2022   Lab Results  Component Value Date   FERRITIN 24 07/07/2020   IRON 72 07/17/2013   IRONPCTSAT 24.5 07/17/2013   Lab Results  Component Value Date   RBC 3.84 (L) 03/10/2022   No results found for: "KPAFRELGTCHN", "LAMBDASER", "KAPLAMBRATIO" Lab Results  Component Value Date   IGA 341 05/25/2015   No results found for: "TOTALPROTELP", "ALBUMINELP", "A1GS", "A2GS", "BETS", "BETA2SER", "GAMS", "MSPIKE", "SPEI"   Chemistry      Component Value Date/Time   NA 140 03/10/2022 1513   NA 139 01/24/2017 0929   K 3.4 (L) 03/10/2022 1513   K 4.0 01/24/2017 0929   CL 105 03/10/2022 1513   CL 103 10/10/2016 1403   CL 105 09/09/2016 1332   CO2 30 03/10/2022 1513   CO2 24 01/24/2017 0929   BUN 9 03/10/2022 1513   BUN 9.5 01/24/2017 0929   CREATININE 0.76 03/10/2022 1513   CREATININE 0.86 01/31/2020 0835   CREATININE 0.8 01/24/2017 0929      Component Value Date/Time    CALCIUM 9.1 03/10/2022 1513   CALCIUM 9.7 01/24/2017 0929   ALKPHOS 67 03/10/2022 1513   ALKPHOS 107 01/24/2017 0929   AST 9 (L) 03/10/2022 1513   AST 18 01/24/2017 0929   ALT 10 03/10/2022 1513   ALT 16 01/24/2017 0929   BILITOT 0.5 03/10/2022 1513   BILITOT 0.31 01/24/2017 0929       Impression and Plan: Ms. Silba is a pleasant 61 yo caucasian female with history of PE on Eliquis 5 mg PO BID.   No changes in her regimen with Eliquis.  Lab check in 8 weeks to re-evaluate CBC and iron studies. Follow-up in 6 months.   Lottie Dawson,  NP 11/6/20232:30 PM

## 2022-03-15 ENCOUNTER — Inpatient Hospital Stay: Payer: 59

## 2022-03-15 ENCOUNTER — Encounter: Payer: Self-pay | Admitting: Internal Medicine

## 2022-03-15 ENCOUNTER — Ambulatory Visit (INDEPENDENT_AMBULATORY_CARE_PROVIDER_SITE_OTHER): Payer: 59 | Admitting: Internal Medicine

## 2022-03-15 VITALS — BP 140/62 | HR 62 | Temp 98.6°F

## 2022-03-15 DIAGNOSIS — I872 Venous insufficiency (chronic) (peripheral): Secondary | ICD-10-CM

## 2022-03-15 DIAGNOSIS — R0609 Other forms of dyspnea: Secondary | ICD-10-CM

## 2022-03-15 DIAGNOSIS — Z0000 Encounter for general adult medical examination without abnormal findings: Secondary | ICD-10-CM

## 2022-03-15 DIAGNOSIS — K5100 Ulcerative (chronic) pancolitis without complications: Secondary | ICD-10-CM | POA: Diagnosis not present

## 2022-03-15 DIAGNOSIS — I471 Supraventricular tachycardia, unspecified: Secondary | ICD-10-CM

## 2022-03-15 DIAGNOSIS — E782 Mixed hyperlipidemia: Secondary | ICD-10-CM

## 2022-03-15 DIAGNOSIS — E559 Vitamin D deficiency, unspecified: Secondary | ICD-10-CM

## 2022-03-15 DIAGNOSIS — E278 Other specified disorders of adrenal gland: Secondary | ICD-10-CM

## 2022-03-15 DIAGNOSIS — F418 Other specified anxiety disorders: Secondary | ICD-10-CM

## 2022-03-15 DIAGNOSIS — Z86711 Personal history of pulmonary embolism: Secondary | ICD-10-CM | POA: Diagnosis not present

## 2022-03-15 NOTE — Patient Instructions (Addendum)
We will get you in with a new cardiologist.  We will get you a stool card to check for blood.

## 2022-03-15 NOTE — Progress Notes (Unsigned)
   Subjective:   Patient ID: Cassie [name], female    DOB: 1960/06/18, 61 y.o.   MRN: [id number]  HPI The patient is a 61 YO female coming in for physical with acute concerns as well.   PMH, [place], social history reviewed and updated  Review of Systems  Constitutional:  Positive for activity change, appetite change, fatigue and unexpected weight change.  HENT: Negative.    Eyes: Negative.   Respiratory:  Positive for shortness of breath. Negative for cough and chest tightness.   Cardiovascular:  Negative for chest pain, palpitations and leg swelling.  Gastrointestinal:  Positive for abdominal pain and diarrhea. Negative for abdominal distention, constipation, nausea and vomiting.  Musculoskeletal:  Positive for arthralgias and myalgias.  Skin: Negative.   Neurological:  Positive for weakness.  Psychiatric/Behavioral: Negative.      Objective:  Physical Exam Constitutional:      Appearance: She is well-developed. She is obese. She is ill-appearing.  HENT:     Head: Normocephalic and atraumatic.  Cardiovascular:     Rate and Rhythm: Normal rate and regular rhythm.  Pulmonary:     Effort: Pulmonary effort is normal. No respiratory distress.     Breath sounds: Normal breath sounds. No wheezing or rales.  Abdominal:     General: Bowel sounds are normal. There is no distension.     Palpations: Abdomen is soft.     Tenderness: There is no abdominal tenderness. There is no rebound.     Comments: Minimal tenderness LLQ  Musculoskeletal:        General: Tenderness present.     Cervical back: Normal range of motion.  Skin:    General: Skin is warm and dry.  Neurological:     Mental Status: She is alert and oriented to person, place, and time.     Coordination: Coordination abnormal.     Vitals:   03/15/22 1550  BP: (!) 140/62  Pulse: 62  Temp: 98.6 F (37 C)  TempSrc: Oral  SpO2: 98%    Assessment & Plan:

## 2022-03-16 ENCOUNTER — Encounter: Payer: Self-pay | Admitting: Internal Medicine

## 2022-03-16 LAB — LIPID PANEL
Cholesterol: 143 mg/dL (ref 0–200)
HDL: 30.3 mg/dL — ABNORMAL LOW (ref >39.00)
LDL Cholesterol: 96 mg/dL (ref 0–99)
NonHDL: 112.65
Total CHOL/HDL Ratio: 5
Triglycerides: 85 mg/dL (ref 0.0–149.0)
VLDL: 17 mg/dL (ref 0.0–40.0)

## 2022-03-16 LAB — VITAMIN D 25 HYDROXY (VIT D DEFICIENCY, FRACTURES): VITD: 17.66 ng/mL — ABNORMAL LOW (ref 30.00–100.00)

## 2022-03-16 LAB — C-REACTIVE PROTEIN: CRP: 1.1 mg/dL (ref 0.5–20.0)

## 2022-03-16 LAB — HEMOGLOBIN A1C: Hgb A1c MFr Bld: 5.7 % (ref 4.6–6.5)

## 2022-03-16 NOTE — Assessment & Plan Note (Signed)
Has had several episodes of this recently with colitis flare. She does valsalva and this clears the episode. She is still taking atenolol 25 mg BID.

## 2022-03-16 NOTE — Assessment & Plan Note (Signed)
Checking vitamin D level and adjust as needed. She is taking otc vitamin D.

## 2022-03-16 NOTE — Assessment & Plan Note (Signed)
Flu shot declines. Covid-19 counseled. Shingrix declines. Tetanus up to date. Colonoscopy up to date. Mammogram up to date, pap smear up to date. Counseled about sun safety and mole surveillance. Counseled about the dangers of distracted driving. Given 10 year screening recommendations.

## 2022-03-16 NOTE — Assessment & Plan Note (Signed)
Recent flare and is not taking sulfasalazine. Is due for follow up with GI. Will check CRP to assess for disease activity. Recent CBC and CMP stable.

## 2022-03-16 NOTE — Assessment & Plan Note (Addendum)
Weight is impacting her health significantly. She is looking to get an electric wheelchair out of pocket. Rx done today to help remove sales tax. She has struggled to lose weight and as her weight has increased she is less and less active and less able to do as much activity. Checking lipid panel and HgA1c to check for complications.

## 2022-03-16 NOTE — Assessment & Plan Note (Signed)
She is starting to have some lymphedema due to chronic fluid and weight. Will see if we can find a lymphedema clinic as she is interested in lymphedema pump.

## 2022-03-16 NOTE — Assessment & Plan Note (Signed)
Uses alprazolam up to BID 1 mg. She is not on controller medicine and mood is stable overall. We agree to continue on this medication and it is well controlled.

## 2022-03-16 NOTE — Assessment & Plan Note (Signed)
Taking eliquis 5 mg BID and functional status is poor. She did have decline in breathing after her PE. She is unsure if she is having some side effects of eliquis. Given her low level of activity and weight she is at moderate to high risk of recurrence so I would recommend eliquis lifelong.

## 2022-03-16 NOTE — Assessment & Plan Note (Signed)
Has gotten moderately worse in the last few years. She is unable to move around much without resting. The SOB got worse after PE and remains on eliquis to treat this. She did feel some improvement with stopping this 4 days for procedure. I suspect that her weight is impacting the SOB some. She had last echo 2021 and would be worth considering repeating this to check for any signs of changes with heart function. She has had PFTs in the past as well without significant asthma/copd. This is limiting to her QOL at this time.

## 2022-03-16 NOTE — Assessment & Plan Note (Signed)
Checking lipid panel and adjust as needed.

## 2022-03-16 NOTE — Addendum Note (Signed)
Addended by: Pricilla Holm A on: 03/16/2022 08:02 AM   Modules accepted: Orders

## 2022-03-16 NOTE — Assessment & Plan Note (Signed)
Does not require more monitoring and no symptoms of hormone secretion. Stable.

## 2022-03-18 ENCOUNTER — Ambulatory Visit: Payer: 59 | Admitting: Family

## 2022-03-18 ENCOUNTER — Other Ambulatory Visit: Payer: Self-pay | Admitting: Internal Medicine

## 2022-03-18 MED ORDER — VITAMIN D (ERGOCALCIFEROL) 1.25 MG (50000 UNIT) PO CAPS: 50000.0000 [IU] | ORAL_CAPSULE | ORAL | 0 refills | Status: DC

## 2022-03-20 ENCOUNTER — Other Ambulatory Visit: Payer: Self-pay | Admitting: Internal Medicine

## 2022-04-04 ENCOUNTER — Telehealth: Payer: Self-pay | Admitting: Internal Medicine

## 2022-04-04 NOTE — Telephone Encounter (Signed)
As it has been more than 1.5 years since she was last seen in person, I would prefer to do an in-person visit.  However, if it is not possible for her to make this appointment, we could do a virtual visit and plan to have her transition her care to a [place] location over the course of the next few months.  Nelva Bush, MD [facility]

## 2022-04-04 NOTE — Telephone Encounter (Signed)
Spoke to the patient. She stated that she lives in [place] and wants to know if she can do a virtual appointment on 11/30 due to travel concerns and her husband needing to take off.  She has been considering changing providers only because she would rather be seen in [place] but for now would prefer a virtual visit.

## 2022-04-04 NOTE — Telephone Encounter (Signed)
Pt would like a call back to discuss upcoming appt being changed to a phone appt. Requesting call back.

## 2022-04-04 NOTE — Telephone Encounter (Signed)
Patient has been made aware. Appointment changed to virtual. She will discuss a provider change at the appointment.

## 2022-04-07 ENCOUNTER — Encounter: Payer: Self-pay | Admitting: Internal Medicine

## 2022-04-07 ENCOUNTER — Ambulatory Visit: Payer: 59 | Attending: Internal Medicine | Admitting: Internal Medicine

## 2022-04-07 VITALS — BP 124/62 | HR 52 | Ht 65.0 in | Wt 328.0 lb

## 2022-04-07 DIAGNOSIS — R002 Palpitations: Secondary | ICD-10-CM

## 2022-04-07 DIAGNOSIS — R0609 Other forms of dyspnea: Secondary | ICD-10-CM

## 2022-04-07 NOTE — Progress Notes (Signed)
Virtual Visit via Telephone Note   Because of Cassie [name]'s co-morbid illnesses, she is at least at moderate risk for complications without adequate follow up.  This format is felt to be most appropriate for this patient at this time.  The patient did not have access to video technology/had technical difficulties with video requiring transitioning to audio format only (telephone).  All issues noted in this document were discussed and addressed.  No physical exam could be performed with this format.  Please refer to the patient's chart for her consent to telehealth for [facility].    Date:  04/07/2022   ID:  Cassie [name], DOB 1960-05-28, MRN [id number] The patient was identified using 2 identifiers.  Patient Location: Home Provider Location: Office/Clinic   PCP:  Hoyt Koch, MD   [place] [facility] Cardiologist:  Nelva Bush, MD     Evaluation Performed:  Follow-Up Visit  Chief Complaint: Follow-up shortness of breath and palpitations  History of Present Illness:    Cassie [name] is a 61 y.o. female with palpitations with brief PSVT and PVCs, chest pain with myocardial bridging in the mid LAD, pulmonary embolism on chronic anticoagulation with apixaban managed by Dr. Marin Olp, ulcerative colitis, vitamin B12 deficiency, vitamin D deficiency, anxiety, and morbid obesity.  We last spoke via virtual visit a year ago, at which time [name] was dealing with sinus congestion.  She also felt more frequent palpitations as well as some vague soreness in the chest and left shoulder, usually when waking up in the morning.  We did not make any medication changes or pursue additional testing.  We planned to meet in person today, but due to transportation issues, [name] requested a virtual visit.  I last saw her in person in 09/2020.  Today, [name] reports that she feels about the same as at her visit last year.  She still has a lot of  swelling in her legs and feet and is hoping to see a lymphedema specialist in the near future.  Referral is being arranged through Dr. Nathanial Millman office.  She has not been able to do much activity at all due to pain in her low back and legs.  She also gets short of breath just walking from her bed to the bathroom.  She has not had any chest pain.  She continues to have intermittent palpitations, lasting a few seconds at a time.  She is often able to abort the episodes with Valsalva maneuver.  She is more currently quartering 50 mg atenolol tablets in order to take 12.5 mg twice daily.  She says these tablets are easier for her to cut than the 25 mg once.  She remains on apixaban managed by Dr. Marin Olp.  Her vaginal bleeding ceased with placement of a Mirena IUD.  She notes some mild gum bleeding, however.  Past Medical History:  Diagnosis Date   Adenomatous colon polyp    Adrenal nodule ([place])    Allergy    Anemia    in her early twenties   Anxiety    Arthritis    knees,thumbs   Cardiac arrhythmia    palpitations,PVC'sAC   Cataract    bilateral removed    Complication of anesthesia    BP dropped with a colonoscopy in [place]. no problems since.   Cyst of kidney, acquired    cyst right kidney- benign   Degenerative disc disease, lumbar    Dyspnea    Dysrhythmia  palpitations   Edema    Gallstones    History of anemia    20 yrs. ago not now- pt denies    History of hiatal hernia    IUD (intrauterine device) in place    Morbid obesity ([place])    Osteoarthritis    Other allergy, other than to medicinal agents    Panic disorder    Pulmonary embolism ([place])    Run of atrial premature complexes    Ulcerative colitis, unspecified    Vitamin B12 deficiency    Vitamin D deficiency    Wheelchair bound    Past Surgical History:  Procedure Laterality Date   BIOPSY  11/17/2020   Procedure: BIOPSY;  Surgeon: Jerene Bears, MD;  Location: [facility];  Service: Gastroenterology;;   BREAST  BIOPSY  11/2011   Left- benign   CATARACT EXTRACTION Right    CATARACT EXTRACTION Left    CHOLECYSTECTOMY N/A 01/17/2014   Procedure: LAPAROSCOPIC CHOLECYSTECTOMY;  Surgeon: Excell Seltzer, MD;  Location: [facility];  Service: General;  Laterality: N/A;   COLONOSCOPY     COLONOSCOPY WITH PROPOFOL N/A 11/17/2020   Procedure: COLONOSCOPY WITH PROPOFOL;  Surgeon: Jerene Bears, MD;  [facility];  Service: Gastroenterology;  Laterality: N/A;   DILATATION & CURETTAGE/HYSTEROSCOPY WITH MYOSURE N/A 12/23/2019   Procedure: DILATATION & CURETTAGE, HYSTEROSCOPY WITH MYOSURE, RESECTION OF POLYPOID TISSUE;  Surgeon: Megan Salon, MD;  Location: [place];  Service: Gynecology;  Laterality: N/A;   HYSTEROSCOPY WITH D & C N/A 07/27/2020   Procedure: DILATATION AND CURETTAGE /HYSTEROSCOPY;  Surgeon: [name], MD;  Location: [place];  Service: Gynecology;  Laterality: N/A;   INTRAUTERINE DEVICE (IUD) INSERTION N/A 07/27/2020   Procedure: INTRAUTERINE DEVICE (IUD) INSERTION MIRENA;  Surgeon: [name], MD;  Location: [place];  Service: Gynecology;  Laterality: N/A;   POLYPECTOMY  11/17/2020   Procedure: POLYPECTOMY;  Surgeon: Jerene Bears, MD;  Location: [facility];  Service: Gastroenterology;;   RETINAL LASER PROCEDURE Left    SIGMOIDOSCOPY     SUBMUCOSAL TATTOO INJECTION  11/17/2020   Procedure: SUBMUCOSAL TATTOO INJECTION;  Surgeon: Jerene Bears, MD;  [facility];  Service: Gastroenterology;;   TOOTH EXTRACTION       Current Meds  Medication Sig   acetaminophen (TYLENOL) 500 MG tablet Take 1,000 mg by mouth every 6 (six) hours as needed for moderate pain or headache.   ALPRAZolam (XANAX) 1 MG tablet TAKE 1/2 TO 1 TABLET(0.5 TO 1 MG) BY MOUTH TWICE DAILY AS NEEDED FOR ANXIETY   atenolol (TENORMIN) 50 MG tablet Take 12.5 mg by mouth 2 (two) times daily.   cholecalciferol (VITAMIN D3) 25 MCG (1000 UNIT) tablet Take 2,000 Units by mouth daily.   ELIQUIS  5 MG TABS tablet TAKE 1 TABLET(5 MG) BY MOUTH TWICE DAILY   levonorgestrel (MIRENA, 52 MG,) 20 MCG/24HR IUD 1 each by Intrauterine route once.   [DISCONTINUED] atenolol (TENORMIN) 25 MG tablet Take 12.5 mg by mouth daily.     Allergies:   Diphenhydramine hcl, Doxycycline, Hydrocodone, Sudafed [pseudoephedrine hcl], Azithromycin, Chlorhexidine gluconate, Ibuprofen, Lactose intolerance (gi), Mesalamine, Caffeine, Codeine, Other, Penicillins, and Prednisone   Social History   Tobacco Use   Smoking status: Former    Packs/day: 2.00    Years: 5.00    Total pack years: 10.00    Types: Cigarettes    Quit date: 03/07/1979    Years since quitting: 43.1   Smokeless tobacco: Never  Vaping Use   Vaping Use: Never used  Substance Use Topics   Alcohol use: No    Alcohol/week: 0.0 standard drinks of alcohol   Drug use: No     Family Hx: The patient's family history includes Atrial fibrillation in her father; Endometrial cancer in her maternal aunt; Heart disease (age of onset: 36) in her father; Hypertension in her maternal aunt and maternal grandfather; Prostate cancer in her father. There is no history of Colon cancer, Stomach cancer, Colon polyps, Esophageal cancer, Rectal cancer, Ovarian cancer, Pancreatic cancer, or Breast cancer.  ROS:   Please see the history of present illness.   All other systems reviewed and are negative.   Prior CV studies:   The following studies were reviewed today:  14-day event monitor (06/19/2020): Predominantly sinus rhythm with rare PACs and PVCs as well as brief PSVT lasting up to 8 beats.   TTE (08/16/2019): Normal LV size and wall thickness.  LVEF 60-65% with grade 1 diastolic dysfunction.  Normal RV size and function.  Mild left ventricular enlargement.  No significant valvular abnormality.   14-day event monitor (06/02/19): Predominantly sinus rhythm with rare PACs and PVCs as well as brief PSVT and a single 5 beat run of NSVT followed by transient  accelerated idioventricular rhythm.  Labs/Other Tests and Data Reviewed:    EKG:  No ECG reviewed.  Recent Labs: 03/10/2022: ALT 10; BUN 9; Creatinine 0.76; Hemoglobin 11.9; Platelet Count 224; Potassium 3.4; Sodium 140   Recent Lipid Panel Lab Results  Component Value Date/Time   CHOL 143 03/15/2022 04:44 PM   TRIG 85.0 03/15/2022 04:44 PM   HDL 30.30 (L) 03/15/2022 04:44 PM   CHOLHDL 5 03/15/2022 04:44 PM   LDLCALC 96 03/15/2022 04:44 PM   LDLCALC 117 (H) 01/31/2020 08:20 AM    Wt Readings from Last 3 Encounters:  04/07/22 (!) 328 lb (148.8 kg)  09/16/21 (!) 320 lb (145.2 kg)  03/10/21 (!) 319 lb (144.7 kg)     Risk Assessment/Calculations:          Objective:    Vital Signs:  BP 124/62   Pulse (!) 52   Ht 5' 5"  (1.651 m)   Wt (!) 328 lb (148.8 kg)   LMP 05/09/2010 (Approximate)   SpO2 98%   BMI 54.58 kg/m    VITAL SIGNS:  reviewed  ASSESSMENT & PLAN:    Palpitations: Overall, palpitations are stable with prior event monitors showing PACs, PVCs, and brief supraventricular runs.  Given that Ms. Shasteen is quartering 50 mg atenolol tablets, we discussed trying an alternative beta-blocker.  However, she does not wish to make this change.  Myocardial bridging: No angina reported.  Continue low-dose atenolol.  Dyspnea on exertion: This continues to be quite profound.  We previously discussed trying to increase her activity level, though this is also limited by her low back and leg pain.  Morbid obesity: Ms. Gutkowski weight continues to rise despite her trying to make dietary changes.  I suspect her inactivity is also playing a significant role.  I am hopeful that treatment of her lymphedema may help with some of her leg pain and allow her to become more active.   Time:   Today, I have spent 18 minutes with the patient with telehealth technology discussing the above problems.     Medication Adjustments/Labs and Tests Ordered: Current medicines are reviewed  at length with the patient today.  Concerns regarding medicines are outlined above.   Tests Ordered: None  Medication Changes:   Follow Up: Ms. Resh initially asked to transition her care to Dr. Johney Frame in [place].  However, she subsequently contacted [place] and wishes to continue seeing me.  We will arrange to see her in the office in 3 months.  Signed, Nelva Bush, MD  04/07/2022 3:19 PM    [place]

## 2022-04-07 NOTE — Patient Instructions (Signed)
Medication Instructions:  Your Physician recommend you continue on your current medication as directed.    *If you need a refill on your cardiac medications before your next appointment, please call your pharmacy*   Lab Work: None ordered today   Testing/Procedures: None ordered today   Follow-Up: At [facility], you and your health needs are our priority.  As part of our continuing mission to provide you with exceptional heart care, we have created designated Provider Care Teams.  These Care Teams include your primary Cardiologist (physician) and Advanced Practice Providers (APPs -  Physician Assistants and Nurse Practitioners) who all work together to provide you with the care you need, when you need it.  We recommend signing up for the patient portal called "MyChart".  Sign up information is provided on this After Visit Summary.  MyChart is used to connect with patients for Virtual Visits (Telemedicine).  Patients are able to view lab/test results, encounter notes, upcoming appointments, etc.  Non-urgent messages can be sent to your provider as well.   To learn more about what you can do with MyChart, go to [email or web address].    Your next appointment:   3 month(s)  The format for your next appointment:   In Person  Provider:   Dr. Johney Frame

## 2022-04-08 ENCOUNTER — Telehealth: Payer: Self-pay | Admitting: Internal Medicine

## 2022-04-08 NOTE — Telephone Encounter (Signed)
Patient is requesting to switch from Dr. Saunders Revel to Dr. Johney Frame. Please advise.

## 2022-04-09 ENCOUNTER — Encounter: Payer: Self-pay | Admitting: Internal Medicine

## 2022-04-09 NOTE — Telephone Encounter (Signed)
Cassie [name] reached out to me after our recent visit and asked that she see me again in 3 months.  However, if she ultimately wishes to transfer her care to Dr. Johney Frame, I am fine with that.  Thanks.  Chris Caetano Oberhaus

## 2022-04-11 NOTE — Telephone Encounter (Signed)
Patient has decided to stay with Dr. Saunders Revel, sorry for the inconvenience. Have a great day!

## 2022-04-11 NOTE — Telephone Encounter (Signed)
I will follow back up with her regarding this, I had a staff message from Dahlia Client, RN in [place] stating that the patient wanted to switch to the [place] office to see Dr. Johney Frame. Thank you.

## 2022-04-27 ENCOUNTER — Telehealth: Payer: Self-pay | Admitting: Internal Medicine

## 2022-04-27 NOTE — Telephone Encounter (Signed)
Yes, ok for virtual OV

## 2022-04-27 NOTE — Telephone Encounter (Signed)
Please see note below and advise if ok for virtual visit.

## 2022-04-27 NOTE — Telephone Encounter (Signed)
See note below and schedule for virtual visit.

## 2022-04-27 NOTE — Telephone Encounter (Signed)
Inbound call from patient stating that she had received a letter in the mail a few months ago to scheduled an OV with Dr. Hilarie Fredrickson before scheduling a repeat colonoscopy. Patient is seeking advice if she can have a virtual visit to discuss due to having mobility issues. Please advise.

## 2022-04-29 ENCOUNTER — Other Ambulatory Visit: Payer: Self-pay | Admitting: Hematology & Oncology

## 2022-04-29 DIAGNOSIS — Z86711 Personal history of pulmonary embolism: Secondary | ICD-10-CM

## 2022-04-29 DIAGNOSIS — I82401 Acute embolism and thrombosis of unspecified deep veins of right lower extremity: Secondary | ICD-10-CM

## 2022-05-23 ENCOUNTER — Encounter: Payer: Self-pay | Admitting: Internal Medicine

## 2022-05-23 ENCOUNTER — Encounter: Payer: Self-pay | Admitting: Obstetrics and Gynecology

## 2022-06-28 ENCOUNTER — Other Ambulatory Visit: Payer: Self-pay

## 2022-06-28 ENCOUNTER — Encounter: Payer: Self-pay | Admitting: Internal Medicine

## 2022-06-28 ENCOUNTER — Other Ambulatory Visit: Payer: Self-pay | Admitting: Internal Medicine

## 2022-07-04 NOTE — Progress Notes (Signed)
62 y.o. G0P0000 Married Caucasian female here for annual exam.  Pt has noticed yeast on her skin, but not vaginally.  No vaginal bleeding.  Occasional whitish discharge.    Her husband is present for the visit today.  Hx right and left lower quadrant discomfort, attributed to colitis.   Waiting to get in to the lymphedema clinic.  Dealing with weight gain, shortness of breath and LE edema.  States she has an appointment with cardiology soon.   Not leaving her home often due to the above difficulties  PCP:  Hillard Danker.   Patient's last menstrual period was 05/09/2010 (approximate).           Sexually active: No.  The current method of family planning is post menopausal status/Mirena IUD.   Mirena placed 07/27/20.  Exercising: No.     Smoker:  former  Health Maintenance: Pap:  06/20/19 neg: HR HPV neg History of abnormal Pap:  no MMG:  02/21/22 Breast Density Category A, BI-RADS CATEGORY 1 neg Colonoscopy:  11/17/20 BMD:   12/10/19  Result  normal TDaP:  05/09/22 Gardasil:   no HIV: 07/04/16 NR Hep C: 12/25/15 Neg Screening Labs:  PCP and cardiology   reports that she quit smoking about 43 years ago. Her smoking use included cigarettes. She has a 10.00 pack-year smoking history. She has never used smokeless tobacco. She reports that she does not drink alcohol and does not use drugs.  Past Medical History:  Diagnosis Date   Adenomatous colon polyp    Adrenal nodule ([facility])    Allergy    Anemia    in her early twenties   Anxiety    Arthritis    knees,thumbs   Cardiac arrhythmia    palpitations,PVC'sAC   Cataract    bilateral removed    Complication of anesthesia    BP dropped with a colonoscopy in [place]. no problems since.   Cyst of kidney, acquired    cyst right kidney- benign   Degenerative disc disease, lumbar    Dyspnea    Dysrhythmia    palpitations   Edema    Gallstones    History of anemia    20 yrs. ago not now- pt denies    History of hiatal hernia     IUD (intrauterine device) in place    Morbid obesity ([facility])    Osteoarthritis    Other allergy, other than to medicinal agents    Panic disorder    Pulmonary embolism ([facility])    Run of atrial premature complexes    Ulcerative colitis, unspecified    Vitamin B12 deficiency    Vitamin D deficiency    Wheelchair bound     Past Surgical History:  Procedure Laterality Date   BIOPSY  11/17/2020   Procedure: BIOPSY;  Surgeon: Beverley Fiedler, MD;  [facility];  Service: Gastroenterology;;   BREAST BIOPSY  11/2011   Left- benign   CATARACT EXTRACTION Right    CATARACT EXTRACTION Left    CHOLECYSTECTOMY N/A 01/17/2014   Procedure: LAPAROSCOPIC CHOLECYSTECTOMY;  Surgeon: Glenna Fellows, MD;  [facility]: [facility];  Service: General;  Laterality: N/A;   COLONOSCOPY     COLONOSCOPY WITH PROPOFOL N/A 11/17/2020   Procedure: COLONOSCOPY WITH PROPOFOL;  Surgeon: Beverley Fiedler, MD;  [facility];  Service: Gastroenterology;  Laterality: N/A;   DILATATION & CURETTAGE/HYSTEROSCOPY WITH MYOSURE N/A 12/23/2019   Procedure: DILATATION & CURETTAGE, HYSTEROSCOPY WITH MYOSURE, RESECTION OF POLYPOID TISSUE;  Surgeon: Jerene Bears, MD;  [facility];  Service: Gynecology;  Laterality: N/A;   HYSTEROSCOPY WITH D & C N/A 07/27/2020   Procedure: DILATATION AND CURETTAGE /HYSTEROSCOPY;  Surgeon: Patton Salles, MD;  [facility];  Service: Gynecology;  Laterality: N/A;   INTRAUTERINE DEVICE (IUD) INSERTION N/A 07/27/2020   Procedure: INTRAUTERINE DEVICE (IUD) INSERTION MIRENA;  Surgeon: Patton Salles, MD;  Location: [facility];  Service: Gynecology;  Laterality: N/A;   POLYPECTOMY  11/17/2020   Procedure: POLYPECTOMY;  Surgeon: Beverley Fiedler, MD;  Location: [facility];  Service: Gastroenterology;;   RETINAL LASER PROCEDURE Left    SIGMOIDOSCOPY     SUBMUCOSAL TATTOO INJECTION  11/17/2020   Procedure: SUBMUCOSAL TATTOO INJECTION;  Surgeon: Beverley Fiedler, MD;  [facility];  Service: Gastroenterology;;   TOOTH EXTRACTION      Current Outpatient Medications  Medication Sig Dispense Refill   acetaminophen (TYLENOL) 500 MG tablet Take 1,000 mg by mouth every 6 (six) hours as needed for moderate pain or headache.     ALPRAZolam (XANAX) 1 MG tablet TAKE 1/2 TO 1 TABLET(0.5 TO 1 MG) BY MOUTH TWICE DAILY AS NEEDED FOR ANXIETY 60 tablet 5   atenolol (TENORMIN) 50 MG tablet Take 12.5 mg by mouth 2 (two) times daily.     cholecalciferol (VITAMIN D3) 25 MCG (1000 UNIT) tablet Take 2,000 Units by mouth daily.     ELIQUIS 5 MG TABS tablet TAKE 1 TABLET(5 MG) BY MOUTH TWICE DAILY 60 tablet 6   levonorgestrel (MIRENA, 52 MG,) 20 MCG/24HR IUD 1 each by Intrauterine route once.     No current facility-administered medications for this visit.    Family History  Problem Relation Age of Onset   Heart disease Father 24       pacemaker   Atrial fibrillation Father    Prostate cancer Father    Hypertension Maternal Aunt    Endometrial cancer Maternal Aunt    Hypertension Maternal Grandfather    Colon cancer Neg Hx    Stomach cancer Neg Hx    Colon polyps Neg Hx    Esophageal cancer Neg Hx    Rectal cancer Neg Hx    Ovarian cancer Neg Hx    Pancreatic cancer Neg Hx    Breast cancer Neg Hx     Review of Systems  All other systems reviewed and are negative.   Exam:   BP 124/74 (BP Location: Left Arm, Patient Position: Sitting, Cuff Size: Large)   Pulse 64   LMP 05/09/2010 (Approximate)   SpO2 97%     General appearance: alert, cooperative and appears stated age Head: normocephalic, without obvious abnormality, atraumatic Neck: no adenopathy, supple, symmetrical, trachea midline and thyroid normal to inspection and palpation Lungs: clear to auscultation bilaterally Breasts: normal appearance, no masses or tenderness, No nipple retraction or dimpling, No nipple discharge or bleeding, No axillary adenopathy Heart: regular rate and rhythm Abdomen:  soft, non-tender; no masses, no organomegaly Extremities: extremities normal, atraumatic, no cyanosis or edema Skin: skin color, texture, turgor normal. No rashes or lesions Lymph nodes: cervical, supraclavicular, and axillary nodes normal. Neurologic: grossly normal  Pelvic: External genitalia:  sebaceous cysts.              No abnormal inguinal nodes palpated.              Urethra:  normal appearing urethra with no masses, tenderness or lesions  Bartholins and Skenes: normal                 Vagina: normal appearing vagina with normal color and discharge, no lesions              Cervix: no lesions.  IUD strings noted.              Pap taken: no Bimanual Exam:  Uterus:  normal size, contour, position, consistency, mobility, non-tender              Adnexa: no mass, fullness, tenderness              Rectal exam: declined.  Chaperone was present for exam:  Irving Burton  Assessment:   Well woman visit with gynecologic exam. Hx simple endometrial hyperplasia, dx at [facility] 04/11/19.   Last endometrial sampling negative for hyperplasia on 07/27/20.   RLQ and LLQ discomfort. Hx colitis.  Mirena IUD.  Placed 07/27/20.  Sebaceous cysts of the vulva.   On Eliquis bid.  Hx PE. Hx colitis.   Plan: Mammogram screening discussed. Self breast awareness reviewed. Pap and HR HPV 2026. Guidelines for Calcium, Vitamin D, regular exercise program including cardiovascular and weight bearing exercise. Continue Mirena IUD.   Plan for pelvic US and endometrial biopsy if patient has postmenopausal bleeding.  I encouraged her to discussed care with her PCP or cardiology a referral to a weight loss clinic. Follow up annually and prn.   After visit summary provided.

## 2022-07-11 ENCOUNTER — Encounter: Payer: Self-pay | Admitting: Obstetrics and Gynecology

## 2022-07-14 ENCOUNTER — Telehealth: Payer: Self-pay | Admitting: Internal Medicine

## 2022-07-14 NOTE — Telephone Encounter (Signed)
Prescription Request  07/14/2022  LOV: 03/15/2022  What is the name of the medication or equipment? Atenolol 50 mg, needs a 90 day supply  Have you contacted your pharmacy to request a refill? Yes   Which pharmacy would you like this sent to?  [id number] [address], [address] AT [facility] OF [address] [place] [address] Phone: [phone] Fax: [phone]    Patient notified that their request is being sent to the clinical staff for review and that they should receive a response within 2 business days.   Please advise at [facility] [phone]

## 2022-07-15 NOTE — Telephone Encounter (Signed)
It looks like her cardiologist changed the dosing of this medicine will he now prescribe? I do not have accurate current dosing.

## 2022-07-18 ENCOUNTER — Ambulatory Visit (INDEPENDENT_AMBULATORY_CARE_PROVIDER_SITE_OTHER): Payer: 59 | Admitting: Obstetrics and Gynecology

## 2022-07-18 ENCOUNTER — Encounter: Payer: Self-pay | Admitting: Obstetrics and Gynecology

## 2022-07-18 VITALS — BP 124/74 | HR 64

## 2022-07-18 DIAGNOSIS — Z01419 Encounter for gynecological examination (general) (routine) without abnormal findings: Secondary | ICD-10-CM

## 2022-07-18 MED ORDER — NYSTATIN 100000 UNIT/GM EX POWD: 1.0000 | Freq: Three times a day (TID) | CUTANEOUS | 3 refills | Status: DC

## 2022-07-18 NOTE — Patient Instructions (Signed)

## 2022-07-20 ENCOUNTER — Encounter: Payer: Self-pay | Admitting: Internal Medicine

## 2022-07-20 ENCOUNTER — Telehealth: Payer: Self-pay | Admitting: Internal Medicine

## 2022-07-20 ENCOUNTER — Other Ambulatory Visit
Admission: RE | Admit: 2022-07-20 | Discharge: 2022-07-20 | Disposition: A | Discharge status: Home / Self Care | Payer: 59 | Attending: Internal Medicine | Admitting: Internal Medicine

## 2022-07-20 ENCOUNTER — Ambulatory Visit: Payer: 59 | Attending: Internal Medicine | Admitting: Internal Medicine

## 2022-07-20 VITALS — BP 130/70 | HR 58 | Ht 65.0 in | Wt 330.0 lb

## 2022-07-20 DIAGNOSIS — I471 Supraventricular tachycardia, unspecified: Secondary | ICD-10-CM | POA: Diagnosis not present

## 2022-07-20 DIAGNOSIS — R002 Palpitations: Secondary | ICD-10-CM | POA: Diagnosis not present

## 2022-07-20 DIAGNOSIS — R0602 Shortness of breath: Secondary | ICD-10-CM | POA: Insufficient documentation

## 2022-07-20 DIAGNOSIS — R600 Localized edema: Secondary | ICD-10-CM | POA: Diagnosis not present

## 2022-07-20 DIAGNOSIS — Q245 Malformation of coronary vessels: Secondary | ICD-10-CM | POA: Diagnosis not present

## 2022-07-20 DIAGNOSIS — D649 Anemia, unspecified: Secondary | ICD-10-CM | POA: Diagnosis present

## 2022-07-20 LAB — RETICULOCYTES
Immature Retic Fract: 11.9 % (ref 2.3–15.9)
RBC.: 4.26 MIL/uL (ref 3.87–5.11)
Retic Count, Absolute: 60.1 10*3/uL (ref 19.0–186.0)
Retic Ct Pct: 1.4 % (ref 0.4–3.1)

## 2022-07-20 LAB — CBC
HCT: 40.3 % (ref 36.0–46.0)
Hemoglobin: 12.5 g/dL (ref 12.0–15.0)
MCH: 29.3 pg (ref 26.0–34.0)
MCHC: 31 g/dL (ref 30.0–36.0)
MCV: 94.4 fL (ref 80.0–100.0)
Platelets: 251 10*3/uL (ref 150–400)
RBC: 4.27 MIL/uL (ref 3.87–5.11)
RDW: 14.6 % (ref 11.5–15.5)
WBC: 7.8 10*3/uL (ref 4.0–10.5)
nRBC: 0 % (ref 0.0–0.2)

## 2022-07-20 LAB — IRON AND TIBC
Iron: 60 ug/dL (ref 28–170)
Saturation Ratios: 18 % (ref 10.4–31.8)
TIBC: 335 ug/dL (ref 250–450)
UIBC: 275 ug/dL

## 2022-07-20 LAB — BASIC METABOLIC PANEL
Anion gap: 5 (ref 5–15)
BUN: 9 mg/dL (ref 8–23)
CO2: 28 mmol/L (ref 22–32)
Calcium: 8.5 mg/dL — ABNORMAL LOW (ref 8.9–10.3)
Chloride: 107 mmol/L (ref 98–111)
Creatinine, Ser: 0.79 mg/dL (ref 0.44–1.00)
GFR, Estimated: >60 mL/min (ref >60)
Glucose, Bld: 92 mg/dL (ref 70–99)
Potassium: 3.8 mmol/L (ref 3.5–5.1)
Sodium: 140 mmol/L (ref 135–145)

## 2022-07-20 LAB — MAGNESIUM: Magnesium: 2.2 mg/dL (ref 1.7–2.4)

## 2022-07-20 LAB — TSH: TSH: 3.339 u[IU]/mL (ref 0.350–4.500)

## 2022-07-20 NOTE — Telephone Encounter (Signed)
.  Patient c/o Palpitations:  High priority if patient c/o lightheadedness, shortness of breath, or chest pain  How long have you had palpitations/irregular HR/ Afib? Are you having the symptoms now?  Not sure if it was Afib or Tachycardia, but it lasted for 40 minutes constantly  Are you currently experiencing lightheadedness, SOB or CP?  A little lightheaded  Do you have a history of afib (atrial fibrillation) or irregular heart rhythm?   Have you checked your BP or HR? (document readings if available):   have not taken it this morning  Are you experiencing any other symptoms? No- have an appointment today with Dr End- wanted to come in this morning. THis nurse said there were no availability for this morning

## 2022-07-20 NOTE — Progress Notes (Unsigned)
Follow-up Outpatient Visit Date: 07/20/2022  Primary Care Provider: Hoyt Koch, MD [place] [address]  Chief Complaint: Palpitations  HPI:  Cassie [name] is a 62 y.o. female with history of brief PSVT and PVCs, chest pain with myocardial bridging in the mid LAD, pulmonary embolism on chronic anticoagulation with apixaban managed by Dr. Marin Olp, ulcerative colitis, vitamin B12 deficiency, vitamin D deficiency, anxiety, and morbid obesity, who presents for follow-up of PSVT, PVCs, and chest pain.  We last spoke via virtual visit in November, at which time she was feeling about the same as at prior visits.  She continued to complain of leg edema and generalized fatigue.  Intermittent palpitations were similar to prior encounters.  We did not make any medication changes or pursue additional test.  Today, Cassie [name] reports that she had an episode of prolonged palpitations this morning.  It came on suddenly and lasted for about 40 minutes.  She checked her heart rate several times throughout the episode and noted that it fluctuated between 120 and 152 bpm.  There were no associated symptoms.  She contemplated calling EMS but ultimately did not.  She reports having been compliant with her atenolol (12.5 mg daily).  She is concerned about more leg swelling over the last several months.  She is still waiting on a referral to the lymphedema clinic by Dr. Sharlet Salina.  She notes that both compression stockings and diuretics did not help her swelling in the past.  Pain in both legs as well as her low back make it difficult for her to ambulate.  She is not exercising regularly.  She denies chest pain.  She has not fallen.  She remains compliant with apixaban and has not had any bleeding.  --------------------------------------------------------------------------------------------------  Past Medical History:  Diagnosis Date   Adenomatous colon polyp    Adrenal nodule (HCC)     Allergy    Anemia    in her early twenties   Anxiety    Arthritis    knees,thumbs   Cardiac arrhythmia    palpitations,PVC'sAC   Cataract    bilateral removed    Complication of anesthesia    BP dropped with a colonoscopy in [place]. no problems since.   Cyst of kidney, acquired    cyst right kidney- benign   Degenerative disc disease, lumbar    Dyspnea    Dysrhythmia    palpitations   Edema    Gallstones    History of anemia    20 yrs. ago not now- pt denies    History of hiatal hernia    IUD (intrauterine device) in place    Morbid obesity ([place])    Osteoarthritis    Other allergy, other than to medicinal agents    Panic disorder    Pulmonary embolism ([place])    Run of atrial premature complexes    Ulcerative colitis, unspecified    Vitamin B12 deficiency    Vitamin D deficiency    Wheelchair bound    Past Surgical History:  Procedure Laterality Date   BIOPSY  11/17/2020   Procedure: BIOPSY;  Surgeon: Jerene Bears, MD;  Location: [facility];  Service: Gastroenterology;;   BREAST BIOPSY  11/2011   Left- benign   CATARACT EXTRACTION Right    CATARACT EXTRACTION Left    CHOLECYSTECTOMY N/A 01/17/2014   Procedure: LAPAROSCOPIC CHOLECYSTECTOMY;  Surgeon: Excell Seltzer, MD;  Location: [facility];  Service: General;  Laterality: N/A;   COLONOSCOPY  COLONOSCOPY WITH PROPOFOL N/A 11/17/2020   Procedure: COLONOSCOPY WITH PROPOFOL;  Surgeon: Jerene Bears, MD;  Location: [facility];  Service: Gastroenterology;  Laterality: N/A;   DILATATION & CURETTAGE/HYSTEROSCOPY WITH MYOSURE N/A 12/23/2019   Procedure: DILATATION & CURETTAGE, HYSTEROSCOPY WITH MYOSURE, RESECTION OF POLYPOID TISSUE;  Surgeon: Megan Salon, MD;  Location: [facility];  Service: Gynecology;  Laterality: N/A;   HYSTEROSCOPY WITH D & C N/A 07/27/2020   Procedure: DILATATION AND CURETTAGE /HYSTEROSCOPY;  Surgeon: [name], MD;  Location: [facility];  Service: Gynecology;  Laterality: N/A;    INTRAUTERINE DEVICE (IUD) INSERTION N/A 07/27/2020   Procedure: INTRAUTERINE DEVICE (IUD) INSERTION MIRENA;  Surgeon: [name], MD;  Location: [facility];  Service: Gynecology;  Laterality: N/A;   POLYPECTOMY  11/17/2020   Procedure: POLYPECTOMY;  Surgeon: Jerene Bears, MD;  Location: [facility];  Service: Gastroenterology;;   RETINAL LASER PROCEDURE Left    SIGMOIDOSCOPY     SUBMUCOSAL TATTOO INJECTION  11/17/2020   Procedure: SUBMUCOSAL TATTOO INJECTION;  Surgeon: Jerene Bears, MD;  Location: [facility];  Service: Gastroenterology;;   TOOTH EXTRACTION      Current Meds  Medication Sig   acetaminophen (TYLENOL) 500 MG tablet Take 1,000 mg by mouth every 6 (six) hours as needed for moderate pain or headache.   ALPRAZolam (XANAX) 1 MG tablet TAKE 1/2 TO 1 TABLET(0.5 TO 1 MG) BY MOUTH TWICE DAILY AS NEEDED FOR ANXIETY   atenolol (TENORMIN) 50 MG tablet Take 12.5 mg by mouth 2 (two) times daily.   cholecalciferol (VITAMIN D3) 25 MCG (1000 UNIT) tablet Take 2,000 Units by mouth daily.   cyanocobalamin 1000 MCG tablet Take 1,000 mcg by mouth daily.   ELIQUIS 5 MG TABS tablet TAKE 1 TABLET(5 MG) BY MOUTH TWICE DAILY   levonorgestrel (MIRENA, 52 MG,) 20 MCG/24HR IUD 1 each by Intrauterine route once.   nystatin (MYCOSTATIN/NYSTOP) powder Apply 1 Application topically 3 (three) times daily. Apply to affected area for up to 7 days.    Allergies: Diphenhydramine hcl, Doxycycline, Hydrocodone, Sudafed [pseudoephedrine hcl], Azithromycin, Chlorhexidine gluconate, Ibuprofen, Lactose intolerance (gi), Mesalamine, Caffeine, Codeine, Other, Penicillins, and Prednisone  Social History   Tobacco Use   Smoking status: Former    Packs/day: 2.00    Years: 5.00    Total pack years: 10.00    Types: Cigarettes    Quit date: 03/07/1979    Years since quitting: 43.4   Smokeless tobacco: Never  Vaping Use   Vaping Use: Never used  Substance Use Topics   Alcohol use: No    Alcohol/week:  0.0 standard drinks of alcohol   Drug use: No    Family History  Problem Relation Age of Onset   Heart disease Father 85       pacemaker   Atrial fibrillation Father    Prostate cancer Father    Hypertension Maternal Aunt    Endometrial cancer Maternal Aunt    Hypertension Maternal Grandfather    Colon cancer Neg Hx    Stomach cancer Neg Hx    Colon polyps Neg Hx    Esophageal cancer Neg Hx    Rectal cancer Neg Hx    Ovarian cancer Neg Hx    Pancreatic cancer Neg Hx    Breast cancer Neg Hx     Review of Systems: A 12-system review of systems was performed and was negative except as noted in the HPI.  --------------------------------------------------------------------------------------------------  Physical Exam: BP 130/70 (  BP Location: Right Arm, Patient Position: Sitting, Cuff Size: Large)   Pulse (!) 58   Ht '5\' 5"'$  (1.651 m)   Wt (!) 330 lb (149.7 kg) Comment: per patient -unable to stand on scale  LMP 05/09/2010 (Approximate)   SpO2 98%   BMI 54.91 kg/m   General:  NAD.  Accompanied by her husband. Neck: Unable to assess JVD due to body habitus. Lungs: Clear to auscultation bilaterally without wheezes or crackles. Heart: Regular rate and rhythm without murmurs, rubs, or gallops. Abdomen: Obese but soft and nontender. Extremities: 1+ pretibial edema bilaterally.  EKG: Sinus bradycardia.  No significant abnormalities.  Lab Results  Component Value Date   WBC 8.7 03/10/2022   HGB 11.9 (L) 03/10/2022   HCT 37.9 03/10/2022   MCV 98.7 03/10/2022   PLT 224 03/10/2022    Lab Results  Component Value Date   NA 140 03/10/2022   K 3.4 (L) 03/10/2022   CL 105 03/10/2022   CO2 30 03/10/2022   BUN 9 03/10/2022   CREATININE 0.76 03/10/2022   GLUCOSE 110 (H) 03/10/2022   ALT 10 03/10/2022    Lab Results  Component Value Date   CHOL 143 03/15/2022   HDL 30.30 (L) 03/15/2022   LDLCALC 96 03/15/2022   TRIG 85.0 03/15/2022   CHOLHDL 5 03/15/2022     --------------------------------------------------------------------------------------------------  ASSESSMENT AND PLAN: Palpitations and PSVT: Cassie [name] reports an episode of prolonged palpitations and fluctuating heart rates this morning, which lasted about 40 minutes.  This is the only episode that has ever lasted so long.  EKG today shows sinus rhythm.  She has worn monitors at least twice in the past, which did not show any sustained arrhythmias.  She is certainly at risk for arrhythmias due to her comorbidities.  We discussed repeating an ambulatory cardiac monitor, referring her for loop recorder placement, or trying a wearable device such as an Apple Watch.  Ms. Stapel wishes to investigate the Apple Watch further (she was previously unable to use a Kardia mobile device).  We will defer escalation of her atenolol due to baseline bradycardia.  If symptoms persist and arrhythmia can be documented, referral to EP will need to be considered.  I will check a CBC, BMP, magnesium level, and TSH today.  Myocardial bridging: No angina reported.  Heart rate adequately controlled; continue current dose of atenolol.  Leg edema: This has been a chronic issue, unresponsive to leg elevation, compression stocking use, and diuretic therapy.  I encouraged Ms. Depaola to follow-up with Dr. Sharlet Salina regarding further evaluation for and treatment of possible lymphedema.  Morbid obesity: I suspect that Ms. Loewenstein's weight is contributing to many of her complaints including edema and leg/back pain.  I again encouraged her to lose weight through diet and exercise, as tolerated.  Follow-up: Virtual visit in 6 months.  Nelva Bush, MD 07/20/2022 4:26 PM

## 2022-07-20 NOTE — Telephone Encounter (Signed)
Pt would like a callback regarding today's appt and wanting to know if she can be placed on a electric table that lifts itself due to her not being able to climb up on to a regular exam table because of her mobility issues. Pt states she forgot to mention this earlier when speaking with nurse. Please advise.

## 2022-07-20 NOTE — Telephone Encounter (Signed)
Initial Assessment Questions   1. DESCRIPTION: "Please describe your heart rate or heartbeat that you are having" (e.g., fast/slow, regular/irregular, skipped or extra beats, "palpitations") "It feels fast and fluttery, I don't know if its tachycardia or not"  2. ONSET: "When did it start?" (Minutes, hours or days) "Its an ongoing thing but this just started recently"  3. DURATION: "How long does it last" (e.g., seconds, minutes, hours) "I'm not really sure, it feels like 15 seconds or so but its being doing it for about a half hour at least"  4. PATTERN "Does it come and go, or has it been constant  since it started?"  "Does it get worse with exertion?"   "Are you feeling it now?" "It comes and goes but its not doing it now"  5. TAP: "Using your hand, can you tap out what you are feeling on a chair or table in front of you, so that I can hear?" (Note: not all patients can do this)  N/A  6. HEART RATE: "Can you tell me your heart rate?" "How many beats in 15 seconds?"  (Note: not all patients can do this)  "Its been running all over the place like 128, 133"  7. RECURRENT SYMPTOM: "Have you ever had this before?" If Yes, ask: "When was the last time?" and "What happened that time?" "I've had this before but its been a while since the last time, I had to do the maneuver like you're having a bowel movement and it went back in rhythm"  8. CAUSE: "What do you think is causing the palpitations?" "I have a history of Afib"  9. CARDIAC HISTORY: "Do you have any history of heart disease?" (e.g., heart attack, angina, bypass surgery, angioplasty, arrhythmia) "I have a history of Afib and SVT"  10. OTHER SYMPTOMS: "Do you have any other symptoms?" (e.g., dizziness, chest pain, sweating, difficulty breathing) "I've had some dizziness but that went away, I don't have it now"  11. PREGNANCY: "Is there any chance you are pregnant?" "When was your last menstrual period?" N/A  Care Advice:  1: CALL PCP  WITHIN 24 HOURS: Patient scheduled 07/20/22 @ 1600  2: HEALTH BASICS: * Sleep: Try to get a sufficient amount of sleep. Lack of sleep can aggravate palpitations. Most people need 7 to 8 hours of sleep each night. * Exercise: Regular exercise will improve your overall health, improve your mood, and is a simple method to reduce stress. * Diet: Eat a balanced healthy diet. * Liquid Intake: Drink adequate liquids, 6 to 8 glasses of water daily.  3: AVOID CAFFEINE: * Avoid caffeine-containing beverages. Reason: Caffeine is a stimulant and can aggravate palpitations. * Examples include coffee, tea, colas, [address], [employer], and some 'energy drinks'.  4: LIMIT ALCOHOL: * Limit your alcohol consumption to no more than 2 drinks a day. * Ideally, eliminate alcohol entirely for the next two weeks.  5: OTHER: * For smokers - Stop or reduce your smoking. * Avoid diet pills. Reason: They act as stimulants.  6: CALL BACK IF: * Chest pain, lightheadedness or difficulty breathing occurs * Heart beating over 140 beats / minute * More than 3 extra or skipped beats / minute * You become worse

## 2022-07-20 NOTE — Telephone Encounter (Signed)
Nurse contacted pt and informed pt we unfortunately do not have electric tables but would be able to complete EKG while sitting in the chair.  Pt made aware and verbalized understanding

## 2022-07-20 NOTE — Patient Instructions (Signed)
Medication Instructions:  Your Physician recommend you continue on your current medication as directed.    *If you need a refill on your cardiac medications before your next appointment, please call your pharmacy*   Lab Work: Your provider would like for you to have following labs drawn: (CBC, BMP, Mg, TSH).   Please go to the [facility] entrance and check in at the front desk.  You do not need an appointment.  They are open from 7am-6 pm.   If you have labs (blood work) drawn today and your tests are completely normal, you will receive your results only by: [place] (if you have MyChart) OR A paper copy in the mail If you have any lab test that is abnormal or we need to change your treatment, we will call you to review the results.   Testing/Procedures: None ordered today   Follow-Up: At [facility], you and your health needs are our priority.  As part of our continuing mission to provide you with exceptional heart care, we have created designated Provider Care Teams.  These Care Teams include your primary Cardiologist (physician) and Advanced Practice Providers (APPs -  Physician Assistants and Nurse Practitioners) who all work together to provide you with the care you need, when you need it.  We recommend signing up for the patient portal called "MyChart".  Sign up information is provided on this After Visit Summary.  MyChart is used to connect with patients for Virtual Visits (Telemedicine).  Patients are able to view lab/test results, encounter notes, upcoming appointments, etc.  Non-urgent messages can be sent to your provider as well.   To learn more about what you can do with MyChart, go to [email or web address].    Your next appointment:   6 month(s) virtual appointment  Provider:   You may see Nelva Bush, MD or one of the following Advanced Practice Providers on your designated Care Team:   Murray Hodgkins, NP Christell Faith, PA-C [place]  Kathlen Mody, PA-C Gerrie Nordmann, NP

## 2022-07-21 ENCOUNTER — Encounter: Payer: Self-pay | Admitting: Internal Medicine

## 2022-07-21 NOTE — Telephone Encounter (Signed)
Spoke with pt and informed anemia dx was based on RBC and Hgb from 03/10/22 CBC and Dr. Saunders Revel  wanted to reassess. Pt also made aware of recent lab results along with MD's recommendation. Pt verbalized understanding.   Nelva Bush, MD 07/21/2022  6:58 AM EDT Back to Top    Please let Cassie [name] know that her labs do not show any significant abnormalities to explain her recent palpitations.  Her calcium was slightly low; I recommend that she speak with her PCP regarding the need for calcium supplementation.

## 2022-07-24 ENCOUNTER — Other Ambulatory Visit: Payer: Self-pay | Admitting: Internal Medicine

## 2022-07-25 ENCOUNTER — Encounter: Payer: Self-pay | Admitting: Internal Medicine

## 2022-07-28 ENCOUNTER — Encounter: Payer: Self-pay | Admitting: Internal Medicine

## 2022-08-16 ENCOUNTER — Telehealth: Payer: Self-pay

## 2022-08-16 ENCOUNTER — Encounter: Payer: Self-pay | Admitting: Internal Medicine

## 2022-08-16 ENCOUNTER — Telehealth (INDEPENDENT_AMBULATORY_CARE_PROVIDER_SITE_OTHER): Payer: 59 | Admitting: Internal Medicine

## 2022-08-16 VITALS — Ht 65.0 in | Wt 340.0 lb

## 2022-08-16 DIAGNOSIS — Z86010 Personal history of colonic polyps: Secondary | ICD-10-CM

## 2022-08-16 DIAGNOSIS — Z7901 Long term (current) use of anticoagulants: Secondary | ICD-10-CM

## 2022-08-16 DIAGNOSIS — K51019 Ulcerative (chronic) pancolitis with unspecified complications: Secondary | ICD-10-CM | POA: Diagnosis not present

## 2022-08-16 MED ORDER — NA SULFATE-K SULFATE-MG SULF 17.5-3.13-1.6 GM/177ML PO SOLN: 1.0000 | Freq: Once | ORAL | 0 refills | Status: AC

## 2022-08-16 NOTE — Telephone Encounter (Signed)
[facility] Pre-operative Risk Assessment     Request for surgical clearance:     Endoscopy Procedure  What type of surgery is being performed?     colonoscopy  When is this surgery scheduled?     10/20/2022  What type of clearance is required ?   Pharmacy  Are there any medications that need to be held prior to surgery and how long? Eliquis 2 day   Practice name and name of physician performing surgery?      [facility]  What is your office phone and fax number?      Phone- [phone]  Fax- [phone]  Anesthesia type (None, local, MAC, general) ?       MAC  

## 2022-08-16 NOTE — Patient Instructions (Addendum)
You have been scheduled for a colonoscopy. Please follow written instructions given to you at your visit today.  Please pick up your prep supplies at the pharmacy within the next 1-3 days. If you use inhalers (even only as needed), please bring them with you on the day of your procedure.   _______________________________________________________  If your blood pressure at your visit was 140/90 or greater, please contact your primary care physician to follow up on this.  _______________________________________________________  If you are age 36 or older, your body mass index should be between 23-30. Your Body mass index is 56.58 kg/m. If this is out of the aforementioned range listed, please consider follow up with your Primary Care Provider.  If you are age 61 or younger, your body mass index should be between 19-25. Your Body mass index is 56.58 kg/m. If this is out of the aformentioned range listed, please consider follow up with your Primary Care Provider.   ________________________________________________________  The [facility] providers would like to encourage you to use [facility] to communicate with providers for non-urgent requests or questions.  Due to long hold times on the telephone, sending your provider a message by [facility] may be a faster and more efficient way to get a response.  Please allow 48 business hours for a response.  Please remember that this is for non-urgent requests.  _______________________________________________________ It was a pleasure to see you today!  Thank you for trusting me with your gastrointestinal care!

## 2022-08-16 NOTE — Progress Notes (Signed)
Subjective:    Patient ID: Cassie [name], female    DOB: January 21, 1961, 62 y.o.   MRN: [id number]  This service was provided via telemedicine with audio/visual communication. The patient was located at home The provider was located in provider's GI office. The patient did consent to this telephone visit and is aware of possible charges through their insurance for this visit.   The persons participating in this telemedicine service were the patient and I. Time spent on call: 18 min   HPI Perlina Kohman is a 62 year old female with a history of longstanding ulcerative colitis (diagnosis age 89), history of adenomatous colon polyp including 1 greater than a centimeter at colonoscopy in July 2022, prior DVT and PE on Eliquis, history of B12 deficiency, arthritis and obesity who is here for follow-up.  She is seen by video virtual visit today.  She was last seen in the office on 02/15/2021.  She has been not taking any long-term medication for her colitis.  She has prescription for sulfasalazine but does not take this.  She had a flare of her colitis which lasted 4 to 5 weeks in October 2023.  This was associated with fecal urgency, loose stools and occasional blood and mucus.  After about 5 weeks this resolved.  Most recently bowel movements have been regular and her stools are "better shaped" particularly since her last colonoscopy.  She does occasionally have gas and bloating and she avoids foods like fresh fruits, fresh vegetables and roughage.  She denies recent abdominal pain.  No upper GI or hepatobiliary complaint.  In late 2022 she also ended up having endometrial bleeding which was found to be benign.  She had several D&Cs and eventually a Mirena IUD was placed and this issue has resolved.  Review of Systems As per HPI, otherwise negative  Current Medications, Allergies, Past Medical History, Past Surgical History, Family History and Social History were reviewed in [facility] record.    Objective:   Physical Exam Ht 5\' 5"  (1.651 m)   Wt (!) 340 lb (154.2 kg)   LMP 05/09/2010 (Approximate)   BMI 56.58 kg/m  Gen: awake, alert, NAD, well-appearing by virtual video visit today HEENT: anicteric  Neuro: nonfocal     Latest Ref Rng & Units 07/20/2022    5:07 PM 03/10/2022    3:13 PM 09/16/2021    3:04 PM  CBC  WBC 4.0 - 10.5 K/uL 7.8  8.7  8.9   Hemoglobin 12.0 - 15.0 g/dL 11.0  31.5  94.5   Hematocrit 36.0 - 46.0 % 40.3  37.9  40.2   Platelets 150 - 400 K/uL 251  224  242    CMP     Component Value Date/Time   NA 140 07/20/2022 1707   NA 139 01/24/2017 0929   K 3.8 07/20/2022 1707   K 4.0 01/24/2017 0929   CL 107 07/20/2022 1707   CL 103 10/10/2016 1403   CL 105 09/09/2016 1332   CO2 28 07/20/2022 1707   CO2 24 01/24/2017 0929   GLUCOSE 92 07/20/2022 1707   GLUCOSE 100 01/24/2017 0929   GLUCOSE 92 09/09/2016 1332   BUN 9 07/20/2022 1707   BUN 9.5 01/24/2017 0929   CREATININE 0.79 07/20/2022 1707   CREATININE 0.76 03/10/2022 1513   CREATININE 0.86 01/31/2020 0835   CREATININE 0.8 01/24/2017 0929   CALCIUM 8.5 (L) 07/20/2022 1707   CALCIUM 9.7 01/24/2017 0929   PROT 7.0 03/10/2022 1513  PROT 7.6 01/24/2017 0929   ALBUMIN 3.6 03/10/2022 1513   ALBUMIN 3.2 (L) 01/24/2017 0929   AST 9 (L) 03/10/2022 1513   AST 18 01/24/2017 0929   ALT 10 03/10/2022 1513   ALT 16 01/24/2017 0929   ALKPHOS 67 03/10/2022 1513   ALKPHOS 107 01/24/2017 0929   BILITOT 0.5 03/10/2022 1513   BILITOT 0.31 01/24/2017 0929   GFRNONAA >60 07/20/2022 1707   GFRNONAA >60 03/10/2022 1513   GFRAA >60 01/07/2019 0749   Iron/TIBC/Ferritin/ %Sat    Component Value Date/Time   IRON 60 07/20/2022 1701   TIBC 335 07/20/2022 1701   FERRITIN 24 07/07/2020 1434   IRONPCTSAT 18 07/20/2022 1701   Lab Results  Component Value Date   TSH 3.339 07/20/2022    CRP 1.11 03/15/2022  Fecal calprotectin borderline at 109 in October 2022  Colonoscopy July  2022: 4 polyps but only 1 adenomatous which was a 20 mm tubular adenoma removed from the rectosigmoid colon.  Tattoo was placed distal to this lesion.  Additional polyps ranging from 4 to 12 mm were either inflammatory or hyperplastic.  Mayo score 1 with mild activity.     Assessment & Plan:  62 year old female with a history of longstanding ulcerative colitis (diagnosis age 36), history of adenomatous colon polyp including 1 greater than a centimeter at colonoscopy in July 2022, prior DVT and PE on Eliquis, history of B12 deficiency, arthritis and obesity who is here for follow-up.    Longstanding pan ulcerative colitis --she has mild colitis by last colonoscopy, intermittent flares and a borderline fecal calprotectin.  She did have an adenomatous colon polyp and is overdue for surveillance.  She has long resisted therapy for her ulcerative colitis.  She does not been taking the sulfasalazine and also has not wanted to escalate therapy to a biologic medication.  She is definitely due for surveillance -- Surveillance colonoscopy in the outpatient hospital setting -- I can rediscuss therapy with her depending on findings of colonoscopy  2.  History of adenomatous polyp --greater than a centimeter in the setting of chronic, currently untreated, ulcerative colitis.  Surveillance colonoscopy indicated at this time for elevated risk colon cancer screening and surveillance.  3.  Chronic Eliquis/history of DVT and PE -- Will hold Eliquis 2 days prior to endoscopic procedures - will instruct when and how to resume after procedure. Benefits and risks of procedure explained including risks of bleeding, perforation, infection, missed lesions, reactions to medications and possible need for hospitalization and surgery for complications. Additional rare but real risk of stroke or other vascular clotting events off Eliquis also explained and need to seek urgent help if any signs of these problems occur. Will  communicate by phone or EMR with patient's  prescribing provider to confirm that holding Eliquis is reasonable in this case.

## 2022-08-17 ENCOUNTER — Telehealth: Payer: Self-pay

## 2022-08-17 NOTE — Telephone Encounter (Signed)
Preop Team,  Please advise requesting office that anticoagulation is managed by Dr. Myna Hidalgo in heme-onc. Recommendations for holding Eliquis will need to come from his office.    Thank you!  DW

## 2022-08-17 NOTE — Telephone Encounter (Signed)
[facility] Pre-operative Risk Assessment     Request for surgical clearance:     Endoscopy Procedure  What type of surgery is being performed?     colonoscopy  When is this surgery scheduled?     10/20/2022  What type of clearance is required ?   Pharmacy  Are there any medications that need to be held prior to surgery and how long? Eliquis 2 day   Practice name and name of physician performing surgery?      [facility]  What is your office phone and fax number?      Phone- [phone]  Fax- [phone]  Anesthesia type (None, local, MAC, general) ?       MAC

## 2022-08-17 NOTE — Telephone Encounter (Signed)
Patient on anticoagulation for history of PE, is followed by oncology Myna Hidalgo).  Please reach out to them for Eliquis hold

## 2022-08-17 NOTE — Telephone Encounter (Signed)
I will route over recommendations to requesting surgeon's office.

## 2022-08-30 ENCOUNTER — Encounter: Payer: Self-pay | Admitting: Family

## 2022-10-04 ENCOUNTER — Other Ambulatory Visit: Payer: Self-pay | Admitting: Internal Medicine

## 2022-10-05 ENCOUNTER — Encounter: Payer: Self-pay | Admitting: Family

## 2022-10-05 ENCOUNTER — Inpatient Hospital Stay: Payer: 59 | Admitting: Family

## 2022-10-05 ENCOUNTER — Inpatient Hospital Stay: Payer: 59 | Attending: Hematology & Oncology

## 2022-10-05 VITALS — BP 128/42 | HR 70 | Temp 98.2°F | Resp 18

## 2022-10-05 DIAGNOSIS — Z993 Dependence on wheelchair: Secondary | ICD-10-CM | POA: Diagnosis not present

## 2022-10-05 DIAGNOSIS — I82401 Acute embolism and thrombosis of unspecified deep veins of right lower extremity: Secondary | ICD-10-CM

## 2022-10-05 DIAGNOSIS — I890 Lymphedema, not elsewhere classified: Secondary | ICD-10-CM

## 2022-10-05 DIAGNOSIS — Z86711 Personal history of pulmonary embolism: Secondary | ICD-10-CM

## 2022-10-05 DIAGNOSIS — D649 Anemia, unspecified: Secondary | ICD-10-CM | POA: Diagnosis not present

## 2022-10-05 DIAGNOSIS — Z7901 Long term (current) use of anticoagulants: Secondary | ICD-10-CM | POA: Diagnosis not present

## 2022-10-05 LAB — CBC WITH DIFFERENTIAL (CANCER CENTER ONLY)
Abs Immature Granulocytes: 0.02 10*3/uL (ref 0.00–0.07)
Basophils Absolute: 0 10*3/uL (ref 0.0–0.1)
Basophils Relative: 0 %
Eosinophils Absolute: 0.1 10*3/uL (ref 0.0–0.5)
Eosinophils Relative: 2 %
HCT: 40.9 % (ref 36.0–46.0)
Hemoglobin: 12.6 g/dL (ref 12.0–15.0)
Immature Granulocytes: 0 %
Lymphocytes Relative: 24 %
Lymphs Abs: 1.7 10*3/uL (ref 0.7–4.0)
MCH: 29.2 pg (ref 26.0–34.0)
MCHC: 30.8 g/dL (ref 30.0–36.0)
MCV: 94.9 fL (ref 80.0–100.0)
Monocytes Absolute: 0.4 10*3/uL (ref 0.1–1.0)
Monocytes Relative: 5 %
Neutro Abs: 4.9 10*3/uL (ref 1.7–7.7)
Neutrophils Relative %: 69 %
Platelet Count: 243 10*3/uL (ref 150–400)
RBC: 4.31 MIL/uL (ref 3.87–5.11)
RDW: 14.2 % (ref 11.5–15.5)
WBC Count: 7.1 10*3/uL (ref 4.0–10.5)
nRBC: 0 % (ref 0.0–0.2)

## 2022-10-05 LAB — D-DIMER, QUANTITATIVE: D-Dimer, Quant: <0.27 ug/mL-FEU (ref 0.00–0.50)

## 2022-10-05 LAB — CMP (CANCER CENTER ONLY)
ALT: 10 U/L (ref 0–44)
AST: 11 U/L — ABNORMAL LOW (ref 15–41)
Albumin: 3.7 g/dL (ref 3.5–5.0)
Alkaline Phosphatase: 66 U/L (ref 38–126)
Anion gap: 10 (ref 5–15)
BUN: 9 mg/dL (ref 8–23)
CO2: 30 mmol/L (ref 22–32)
Calcium: 9.7 mg/dL (ref 8.9–10.3)
Chloride: 102 mmol/L (ref 98–111)
Creatinine: 0.9 mg/dL (ref 0.44–1.00)
GFR, Estimated: >60 mL/min (ref >60)
Glucose, Bld: 147 mg/dL — ABNORMAL HIGH (ref 70–99)
Potassium: 4.5 mmol/L (ref 3.5–5.1)
Sodium: 142 mmol/L (ref 135–145)
Total Bilirubin: 0.5 mg/dL (ref 0.3–1.2)
Total Protein: 6.5 g/dL (ref 6.5–8.1)

## 2022-10-05 LAB — FERRITIN: Ferritin: 11 ng/mL (ref 11–307)

## 2022-10-05 NOTE — Progress Notes (Addendum)
Hematology and Oncology Follow Up Visit  Cassie [name] [id number] [birth year] 62 y.o. 10/05/2022   Principle Diagnosis:  Idiopathic right lower lobe pulmonary embolism Ulcerative colitis   Current Therapy:        ELIQUIS 5 mg by mouth twice a day   Interim History:  Cassie [name] is here today with her sister for follow-up. She is still having issues with fatigue, palpitations, SOB with exertion and swelling in her lower extremities effecting her mobility.  No fever, chills, n/v, cough, rash, chest pain, abdominal pain or changes in bowel or bladder habits at this time.  No issues with blood loss. No abnormal bruising, no petechiae.  No falls or syncope reported.  She is in a wheelchair today.  Appetite and hydration are good.   ECOG Performance Status: 2 - Symptomatic, <50% confined to bed  Medications:  Allergies as of 10/05/2022       Reactions   Diphenhydramine Hcl Swelling   Throat feels like it swells    Doxycycline Palpitations   Hydrocodone Other (See Comments)   Hallucinations    Sudafed [pseudoephedrine Hcl] Shortness Of Breath   Azithromycin Palpitations   Speeding heart rate per pt.   Chlorhexidine Gluconate Itching   Burning   Ibuprofen Itching   Lactose Intolerance (gi) Diarrhea, Other (See Comments)   bloating   Mesalamine Other (See Comments)   Hair loss   Caffeine Palpitations   Codeine Other (See Comments)   Other Other (See Comments)   Fillers in some medication/ Heart palpitations Gi upset   Penicillins Rash   Has patient had a PCN reaction causing immediate rash, facial/tongue/throat swelling, SOB or lightheadedness with hypotension: Yes Has patient had a PCN reaction causing severe rash involving mucus membranes or skin necrosis: No Has patient had a PCN reaction that required hospitalization Yes Has patient had a PCN reaction occurring within the last 10 years: No If all of the above answers are "NO", then may proceed with Cephalosporin use.    Prednisone Palpitations        Medication List        Accurate as of Oct 05, 2022  2:40 PM. If you have any questions, ask your nurse or doctor.          acetaminophen 500 MG tablet Commonly known as: TYLENOL Take 1,000 mg by mouth every 6 (six) hours as needed for moderate pain or headache.   ALPRAZolam 1 MG tablet Commonly known as: XANAX TAKE 1/2 TO 1 TABLET(0.5 TO 1 MG) BY MOUTH TWICE DAILY AS NEEDED FOR ANXIETY   atenolol 25 MG tablet Commonly known as: TENORMIN Take 12.5 mg by mouth 2 (two) times daily.   cholecalciferol 25 MCG (1000 UNIT) tablet Commonly known as: VITAMIN D3 Take 2,000 Units by mouth daily.   cyanocobalamin 1000 MCG tablet Take 1,000 mcg by mouth daily.   Eliquis 5 MG Tabs tablet Generic drug: apixaban TAKE 1 TABLET(5 MG) BY MOUTH TWICE DAILY   Mirena (52 MG) 20 MCG/DAY Iud Generic drug: levonorgestrel 1 each by Intrauterine route once.   nystatin powder Commonly known as: MYCOSTATIN/NYSTOP Apply 1 Application topically 3 (three) times daily. Apply to affected area for up to 7 days.        Allergies:  Allergies  Allergen Reactions   Diphenhydramine Hcl Swelling    Throat feels like it swells    Doxycycline Palpitations   Hydrocodone Other (See Comments)    Hallucinations    Sudafed [Pseudoephedrine Hcl] Shortness Of Breath  Azithromycin Palpitations    Speeding heart rate per pt.    Chlorhexidine Gluconate Itching    Burning   Ibuprofen Itching   Lactose Intolerance (Gi) Diarrhea and Other (See Comments)    bloating   Mesalamine Other (See Comments)    Hair loss   Caffeine Palpitations   Codeine Other (See Comments)   Other Other (See Comments)    Fillers in some medication/ Heart palpitations Gi upset   Penicillins Rash    Has patient had a PCN reaction causing immediate rash, facial/tongue/throat swelling, SOB or lightheadedness with hypotension: Yes Has patient had a PCN reaction causing severe rash involving  mucus membranes or skin necrosis: No Has patient had a PCN reaction that required hospitalization Yes Has patient had a PCN reaction occurring within the last 10 years: No If all of the above answers are "NO", then may proceed with Cephalosporin use.   Prednisone Palpitations    Past Medical History, Surgical history, Social history, and Family History were reviewed and updated.  Review of Systems: All other 10 point review of systems is negative.   Physical Exam:  oral temperature is 98.2 F (36.8 C). Her blood pressure is 146/61 (abnormal) and her pulse is 70. Her respiration is 18 and oxygen saturation is 100%.   Wt Readings from Last 3 Encounters:  08/16/22 (!) 340 lb (154.2 kg)  07/20/22 (!) 330 lb (149.7 kg)  04/07/22 (!) 328 lb (148.8 kg)    Ocular: Sclerae unicteric, pupils equal, round and reactive to light Ear-nose-throat: Oropharynx clear, dentition fair Lymphatic: No cervical or supraclavicular adenopathy Lungs no rales or rhonchi, good excursion bilaterally Heart regular rate and rhythm, no murmur appreciated Abd soft, nontender, positive bowel sounds MSK no focal spinal tenderness, no joint edema Neuro: non-focal, well-oriented, appropriate affect Breasts: Deferred   Lab Results  Component Value Date   WBC 7.1 10/05/2022   HGB 12.6 10/05/2022   HCT 40.9 10/05/2022   MCV 94.9 10/05/2022   PLT 243 10/05/2022   Lab Results  Component Value Date   FERRITIN 24 07/07/2020   IRON 60 07/20/2022   TIBC 335 07/20/2022   UIBC 275 07/20/2022   IRONPCTSAT 18 07/20/2022   Lab Results  Component Value Date   RETICCTPCT 1.4 07/20/2022   RBC 4.31 10/05/2022   No results found for: "KPAFRELGTCHN", "LAMBDASER", "KAPLAMBRATIO" Lab Results  Component Value Date   IGA 341 05/25/2015   No results found for: "TOTALPROTELP", "ALBUMINELP", "A1GS", "A2GS", "BETS", "BETA2SER", "GAMS", "MSPIKE", "SPEI"   Chemistry      Component Value Date/Time   NA 140 07/20/2022 1707    NA 139 01/24/2017 0929   K 3.8 07/20/2022 1707   K 4.0 01/24/2017 0929   CL 107 07/20/2022 1707   CL 103 10/10/2016 1403   CL 105 09/09/2016 1332   CO2 28 07/20/2022 1707   CO2 24 01/24/2017 0929   BUN 9 07/20/2022 1707   BUN 9.5 01/24/2017 0929   CREATININE 0.79 07/20/2022 1707   CREATININE 0.76 03/10/2022 1513   CREATININE 0.86 01/31/2020 0835   CREATININE 0.8 01/24/2017 0929      Component Value Date/Time   CALCIUM 8.5 (L) 07/20/2022 1707   CALCIUM 9.7 01/24/2017 0929   ALKPHOS 67 03/10/2022 1513   ALKPHOS 107 01/24/2017 0929   AST 9 (L) 03/10/2022 1513   AST 18 01/24/2017 0929   ALT 10 03/10/2022 1513   ALT 16 01/24/2017 0929   BILITOT 0.5 03/10/2022 1513   BILITOT 0.31 01/24/2017  6045       Impression and Plan: Ms. Louch is a pleasant 62 yo caucasian female with history of PE on Eliquis 5 mg PO BID.   No changes in her regimen with Eliquis, lifelong.  Referral placed with PT for lymphedema in both legs.  Follow-up in 6 months.   Eileen Stanford, NP 5/29/20242:40 PM

## 2022-10-06 LAB — IRON AND IRON BINDING CAPACITY (CC-WL,HP ONLY)
Iron: 95 ug/dL (ref 28–170)
Saturation Ratios: 29 % (ref 10.4–31.8)
TIBC: 330 ug/dL (ref 250–450)
UIBC: 235 ug/dL (ref 148–442)

## 2022-10-07 NOTE — Addendum Note (Signed)
Addended by: Corena Pilgrim on: 10/07/2022 12:51 PM   Modules accepted: Orders

## 2022-10-09 ENCOUNTER — Encounter: Payer: Self-pay | Admitting: Internal Medicine

## 2022-11-08 ENCOUNTER — Encounter: Payer: Self-pay | Admitting: Internal Medicine

## 2022-11-09 ENCOUNTER — Encounter (HOSPITAL_COMMUNITY): Payer: Self-pay | Admitting: Internal Medicine

## 2022-11-11 NOTE — Progress Notes (Signed)
Cassie [name]  Prep instructions- reviewed  PCP- Hillard Danker  Cardiologist- Dr Okey Dupre  EKG-07/20/22 Echo-08/16/19 Cath-n/a Stress-2005 ICD/PM-n/a Blood thinner-Eliquis 2 day hold GLP-1-n/a  Hx: Kidney cyst, PVC's, DVT/PE on chronic anticoagulation, wheelchair bound. Patient last saw cardiology 07/20/22 and spoke with them about a recent episode with palpitations that lasted 40 min. She has discussed having these episodes with the cardiologist but unfortunately have not been able to catch her arrhythmia even though she has worn heart monitors. She reports they still happen from time to time but they don't know what it is or what causes it. Also of note, had an issue with her BP dropping during anesthesia once for [place] colon[place] but [place] and hasn't had any issues since.   Anesthesia Review: Yes

## 2022-11-14 ENCOUNTER — Encounter: Payer: Self-pay | Admitting: Internal Medicine

## 2022-11-14 NOTE — Telephone Encounter (Signed)
Spoke with pt and let her know the other preps work the same way. She will try to drink some extra water to try and speed up the results from the prep so she can try to limit the bathroom visits.

## 2022-11-17 ENCOUNTER — Encounter: Payer: Self-pay | Admitting: Internal Medicine

## 2022-12-02 ENCOUNTER — Other Ambulatory Visit: Payer: Self-pay | Admitting: Hematology & Oncology

## 2022-12-02 DIAGNOSIS — I82401 Acute embolism and thrombosis of unspecified deep veins of right lower extremity: Secondary | ICD-10-CM

## 2022-12-02 DIAGNOSIS — Z86711 Personal history of pulmonary embolism: Secondary | ICD-10-CM

## 2023-01-01 ENCOUNTER — Encounter: Payer: Self-pay | Admitting: Obstetrics and Gynecology

## 2023-01-02 ENCOUNTER — Telehealth: Payer: Self-pay | Admitting: Obstetrics and Gynecology

## 2023-01-02 DIAGNOSIS — N950 Postmenopausal bleeding: Secondary | ICD-10-CM

## 2023-01-02 NOTE — Telephone Encounter (Signed)
Thank you for the update!

## 2023-01-02 NOTE — Telephone Encounter (Signed)
Per AEX notes from 07/18/2022: Plan for [place] and EMB if PMB.   Please advise.

## 2023-01-02 NOTE — Telephone Encounter (Signed)
See telephone encounter dated 01/02/23.   Encounter closed.

## 2023-01-02 NOTE — Telephone Encounter (Signed)
Reviewed MyChart message dated 01/01/23. Spouse, Mairlyn Puccinelli is designated proxy for [employer].   Spoke with patient. Patient has granted spouse, Casimiro Needle, proxy access to MyChart.   Advised per Dr. Edward Jolly, patient is agreeable to scheduling PUS, declines EMB at this time, request to complete PUS, will wait for results. PUS order placed, number provided to patient to call to schedule. Questions answered. Patient verbalizes understanding.   Routing to Dr. Marjorie Smolder.

## 2023-01-02 NOTE — Telephone Encounter (Signed)
Pt now scheduled at the [facility] location on 01/05/2023.

## 2023-01-02 NOTE — Telephone Encounter (Signed)
Pt LVM in triage line stating she had question about her directions prior to her [place].   Spoke w/ pt and she stated she was directed to drink 32oz of water one hour prior to her [place] appt scheduled for 8/30 @ [facility]. She said that BS told her in the past that she didn't have to do that for her US done in-office.  Pt advised that because she is getting US done at an outpatient imaging center and they are ordered to do a complete pelvic US, then they need a full bladder to be able to take the abdominal images.   Pt voiced understanding.   Also, mentioned that she is going to reach out to imaging center to make sure they can accommodate her since she has to use a lift table. So, if they can't, she may have to r/s appt.   Routing to Dr. Marjorie Smolder.

## 2023-01-02 NOTE — Telephone Encounter (Signed)
Please contact patient in follow up to the My Chart message she sent on her husband's phone.  See DPR.   It appears that the message may be coming from his My Chart account?  Messages about her care cannot come through her husband's account if this is the case.   She indicates she may be having postmenopausal bleeding.  She needs to have a pelvic ultrasound and potential endometrial biopsy with me.

## 2023-01-05 ENCOUNTER — Ambulatory Visit (HOSPITAL_BASED_OUTPATIENT_CLINIC_OR_DEPARTMENT_OTHER)
Admission: RE | Admit: 2023-01-05 | Discharge: 2023-01-05 | Disposition: A | Discharge status: Home / Self Care | Payer: 59 | Source: Ambulatory Visit | Attending: Obstetrics and Gynecology | Admitting: Obstetrics and Gynecology

## 2023-01-05 DIAGNOSIS — N950 Postmenopausal bleeding: Secondary | ICD-10-CM | POA: Diagnosis present

## 2023-01-05 NOTE — Telephone Encounter (Signed)
Routing to Dr. Marjorie Smolder.  Will wait for PUS results and recommendations.  Encounter closed.

## 2023-01-06 ENCOUNTER — Other Ambulatory Visit (HOSPITAL_BASED_OUTPATIENT_CLINIC_OR_DEPARTMENT_OTHER): Payer: 59

## 2023-01-13 ENCOUNTER — Encounter: Payer: Self-pay | Admitting: Obstetrics and Gynecology

## 2023-01-16 NOTE — Telephone Encounter (Signed)
See result note. Encounter closed.  

## 2023-01-17 ENCOUNTER — Encounter: Payer: Self-pay | Admitting: Obstetrics and Gynecology

## 2023-01-17 ENCOUNTER — Telehealth (INDEPENDENT_AMBULATORY_CARE_PROVIDER_SITE_OTHER): Payer: 59 | Admitting: Obstetrics and Gynecology

## 2023-01-17 ENCOUNTER — Telehealth: Payer: Self-pay | Admitting: Obstetrics and Gynecology

## 2023-01-17 DIAGNOSIS — Z8742 Personal history of other diseases of the female genital tract: Secondary | ICD-10-CM | POA: Diagnosis not present

## 2023-01-17 DIAGNOSIS — N950 Postmenopausal bleeding: Secondary | ICD-10-CM

## 2023-01-17 DIAGNOSIS — Z30431 Encounter for routine checking of intrauterine contraceptive device: Secondary | ICD-10-CM

## 2023-01-17 NOTE — Telephone Encounter (Signed)
Please place the patient in [place] hold for [facility] November, 2024.   The goal is to do office ultrasound if available at that time.   If not able to do in the office, will need to do through [facility].   Diagnosis:  history endometrial hyperplasia, IUD check up, history of postmenopausal bleeding.

## 2023-01-17 NOTE — Progress Notes (Signed)
GYNECOLOGY  VISIT   HPI: 62 y.o.   Married  Caucasian  female   [id number] with Patient's last menstrual period was 05/09/2010 (approximate).  My Chart video visit to discuss U/S results for postmenopausal bleeding.   Patient confirmed her identity.  She gives permission for the visit today. I am in the office.  She is her bedroom at home.    Hx simple endometrial hyperplasia, dx 04/11/19 at [facility].  She thinks she may have had long standing hyperplasia in her 34s.   She had middle lower back pain and some tiny streak of discharge with black flecks in it that prompted her to contact to the office through My Chart on 01/01/23.  She states her bleeding stopped over a week ago. Maybe some spotting last night and then this am.   She has decreased mobility and difficulty with pelvic examinations.   Asking about her estrogen levels.  Last estradiol level 05/21/20:  62.   Her liver enzymes increased on oral progesterone.   GYNECOLOGIC HISTORY: Patient's last menstrual period was 05/09/2010 (approximate). Contraception:  PMP/ IUD--Mirena 07/27/20 Menopausal hormone therapy:  n/a Last mammogram:  02/21/22 Breast Density Category A, BI-RADS CATEGORY 1 neg  Last pap smear:   06/20/19 neg: HR HPV neg         OB History     Gravida  0   Para  0   Term  0   Preterm  0   AB  0   Living  0      SAB  0   IAB  0   Ectopic  0   Multiple  0   Live Births  0              Patient Active Problem List   Diagnosis Date Noted   Chronic pancolonic ulcerative colitis (HCC)    Benign neoplasm of cecum    Benign neoplasm of transverse colon    Benign neoplasm of sigmoid colon    Benign neoplasm of rectosigmoid junction    History of colonic polyps 09/01/2020   Hyperlipidemia 02/27/2020   Head pain 10/25/2019   Elevated LFTs 10/25/2019   NSVT (nonsustained ventricular tachycardia) (HCC) 07/10/2019   Simple endometrial hyperplasia 06/23/2019   Postmenopausal bleeding 04/03/2019    Cough variant asthma vs UACS from gerd 01/01/2018   PVC (premature ventricular contraction) 08/17/2017   PSVT (paroxysmal supraventricular tachycardia) 08/17/2017   Leg edema 05/19/2017   Other fatigue 12/10/2016   Myocardial bridge 12/10/2016   Coronary artery calcification 12/10/2016   History of pulmonary embolism 12/10/2016   GERD (gastroesophageal reflux disease) 07/12/2016   Dyspnea on exertion 05/31/2016   Chronic venous insufficiency 05/31/2016   Routine general medical examination at a health care facility 06/27/2014   Left medial knee pain 07/17/2013   Adrenal incidentaloma (HCC) 03/25/2013   Allergic rhinitis 09/13/2011   B12 deficiency 11/28/2008   Vitamin D deficiency 06/30/2008   Palpitation 06/30/2008   Morbid obesity (HCC) 10/15/2007   Anxiety about health 08/23/2007   Cardiac dysrhythmia 08/23/2007    Past Medical History:  Diagnosis Date   Adenomatous colon polyp    Adrenal nodule (HCC)    Allergy    Anemia    in her early twenties   Anxiety    Arthritis    knees,thumbs   Cardiac arrhythmia    palpitations,PVC'sAC   Cataract    bilateral removed    Complication of anesthesia    BP dropped with [place] colon[place] in  [place]. no problems since.   Cyst of kidney, acquired    cyst right kidney- benign   Degenerative disc disease, lumbar    Dyspnea    Dysrhythmia    palpitations   Edema    Gallstones    History of anemia    20 yrs. ago not now- pt denies    History of hiatal hernia    IUD (intrauterine device) in place    Morbid obesity (HCC)    Osteoarthritis    Other allergy, other than to medicinal agents    Panic disorder    Pulmonary embolism (HCC)    Run of atrial premature complexes    Ulcerative colitis, unspecified    Vitamin B12 deficiency    Vitamin D deficiency    Wheelchair bound     Past Surgical History:  Procedure Laterality Date   BIOPSY  11/17/2020   Procedure: BIOPSY;  Surgeon: Beverley Fiedler, MD;  Location: [facility];   Service: Gastroenterology;;   BREAST BIOPSY  11/2011   Left- benign   CATARACT EXTRACTION Right    CATARACT EXTRACTION Left    CHOLECYSTECTOMY N/A 01/17/2014   Procedure: LAPAROSCOPIC CHOLECYSTECTOMY;  Surgeon: Glenna Fellows, MD;  Location: [facility];  Service: General;  Laterality: N/A;   COLONOSCOPY     COLONOSCOPY WITH PROPOFOL N/A 11/17/2020   Procedure: COLONOSCOPY WITH PROPOFOL;  Surgeon: Beverley Fiedler, MD;  Location: [facility];  Service: Gastroenterology;  Laterality: N/A;   DILATATION & CURETTAGE/HYSTEROSCOPY WITH MYOSURE N/A 12/23/2019   Procedure: DILATATION & CURETTAGE, HYSTEROSCOPY WITH MYOSURE, RESECTION OF POLYPOID TISSUE;  Surgeon: Jerene Bears, MD;  Location: [facility] OR;  Service: Gynecology;  Laterality: N/A;   HYSTEROSCOPY WITH D & C N/A 07/27/2020   Procedure: DILATATION AND CURETTAGE /HYSTEROSCOPY;  Surgeon: Patton Salles, MD;  Location: [facility] OR;  Service: Gynecology;  Laterality: N/A;   INTRAUTERINE DEVICE (IUD) INSERTION N/A 07/27/2020   Procedure: INTRAUTERINE DEVICE (IUD) INSERTION MIRENA;  Surgeon: Patton Salles, MD;  Location: [facility] OR;  Service: Gynecology;  Laterality: N/A;   POLYPECTOMY  11/17/2020   Procedure: POLYPECTOMY;  Surgeon: Beverley Fiedler, MD;  Location: [facility];  Service: Gastroenterology;;   RETINAL LASER PROCEDURE Left    SIGMOIDOSCOPY     SUBMUCOSAL TATTOO INJECTION  11/17/2020   Procedure: SUBMUCOSAL TATTOO INJECTION;  Surgeon: Beverley Fiedler, MD;  Location: [facility];  Service: Gastroenterology;;   TOOTH EXTRACTION      Current Outpatient Medications  Medication Sig Dispense Refill   acetaminophen (TYLENOL) 500 MG tablet Take 1,000 mg by mouth every 6 (six) hours as needed for moderate pain or headache.     ALPRAZolam (XANAX) 1 MG tablet TAKE 1/2 TO 1 TABLET(0.5 TO 1 MG) BY MOUTH TWICE DAILY AS NEEDED FOR ANXIETY 60 tablet 5   atenolol (TENORMIN) 25 MG tablet Take 12.5 mg by mouth 2 (two) times daily.      cholecalciferol (VITAMIN D3) 25 MCG (1000 UNIT) tablet Take 2,000 Units by mouth daily.     cyanocobalamin 1000 MCG tablet Take 1,000 mcg by mouth daily.     ELIQUIS 5 MG TABS tablet TAKE 1 TABLET(5 MG) BY MOUTH TWICE DAILY 60 tablet 6   levonorgestrel (MIRENA, 52 MG,) 20 MCG/24HR IUD 1 each by Intrauterine route once.     nystatin (MYCOSTATIN/NYSTOP) powder Apply 1 Application topically 3 (three) times daily. Apply to affected area for up to 7 days. (Patient not taking: Reported on 10/05/2022) 30 g  3   No current facility-administered medications for this visit.     ALLERGIES: Diphenhydramine hcl, Doxycycline, Hydrocodone, Sudafed [pseudoephedrine hcl], Azithromycin, Chlorhexidine gluconate, Ibuprofen, Lactose intolerance (gi), Mesalamine, Caffeine, Codeine, Other, Penicillins, and Prednisone  Family History  Problem Relation Age of Onset   Heart disease Father 4       pacemaker   Atrial fibrillation Father    Prostate cancer Father    Hypertension Maternal Aunt    Endometrial cancer Maternal Aunt    Hypertension Maternal Grandfather    Colon cancer Neg Hx    Stomach cancer Neg Hx    Colon polyps Neg Hx    Esophageal cancer Neg Hx    Rectal cancer Neg Hx    Ovarian cancer Neg Hx    Pancreatic cancer Neg Hx    Breast cancer Neg Hx     Social History   Socioeconomic History   Marital status: Married    Spouse name: Not on file   Number of children: 0   Years of education: Not on file   Highest education level: Not on file  Occupational History   Occupation: Engineer, site: UNEMPLOYED  Tobacco Use   Smoking status: Former    Current packs/day: 0.00    Average packs/day: 2.0 packs/day for 5.0 years (10.0 ttl pk-yrs)    Types: Cigarettes    Start date: 03/06/1974    Quit date: 03/07/1979    Years since quitting: 43.8   Smokeless tobacco: Never  Vaping Use   Vaping status: Never Used  Substance and Sexual Activity   Alcohol use: No    Alcohol/week: 0.0 standard  drinks of alcohol   Drug use: No   Sexual activity: Not Currently    Partners: Male    Birth control/protection: Post-menopausal  Other Topics Concern   Not on file  Social History Narrative   Married: 0 kids   Regular exercise: walks 2 times a week   Caffeine use: none   Social Determinants of Corporate investment banker Strain: Not on file  Food Insecurity: Not on file  Transportation Needs: Not on file  Physical Activity: Not on file  Stress: Not on file  Social Connections: Not on file  Intimate Partner Violence: Not on file    Review of Systems  All other systems reviewed and are negative.   PHYSICAL EXAMINATION:    LMP 05/09/2010 (Approximate)     General appearance: alert, cooperative and appears stated age  Study Result  Narrative & Impression  CLINICAL DATA:  Postmenopausal bleeding. Patient had Mirena IUD placed 07/27/2020.   EXAM: TRANSABDOMINAL AND TRANSVAGINAL ULTRASOUND OF PELVIS   TECHNIQUE: Both transabdominal and transvaginal ultrasound examinations of the pelvis were performed. Transabdominal technique was performed for global imaging of the pelvis including uterus, ovaries, adnexal regions, and pelvic cul-de-sac. It was necessary to proceed with endovaginal exam following the transabdominal exam to visualize the uterus, endometrium ovaries, and adnexal regions.   COMPARISON:  05/21/2020 and CT of the abdomen and pelvis 06/10/2020   FINDINGS: Uterus   Measurements: 7.8 x 3.6 x 4.0 centimeters = volume: 57.8 mL. No fibroids or other mass visualized.   Endometrium   Thickness: 6.0 millimeters. New intrauterine device is difficult to visualized. However, the device is likely to be within the LOWER uterine segment.   Right ovary   Measurements: Not visualized   Left ovary   Measurements: 1.7 x 1.6 x 1.8 centimeters = volume: 2.5 mL. Normal appearance/no adnexal mass.  Other findings   Study quality is degraded by patient body  habitus.   IMPRESSION: 1. Intrauterine device is difficult to visualized. However, the device is likely to be within the LOWER uterine segment. 2. Endometrial stripe measures 6 mm. 3. Nonvisualized RIGHT ovary.     Electronically Signed   By: Norva Pavlov M.D.   On: 01/12/2023 16:32     ASSESSMENT  Potential postmenopausal bleeding.  Hx simple endometrial hyperplasia treated with Mirena IUD.  Recent US with EMS 6 mm and IUD potentially low in the endometrial canal.  Difficult visualization.   PLAN  We reviewed her pelvic US findings.  We discussed options for care:  office endometrial biopsy, hysteroscopy/new Mirena IUD placement, or close follow up with pelvic US in about 10 weeks, mid-November.  Patient opts for pelvic US follow up.  Would try to do in the office setting if possible. She will call for any overt vaginal bleeding.     28 minute consultation.

## 2023-01-19 ENCOUNTER — Encounter: Payer: Self-pay | Admitting: Obstetrics and Gynecology

## 2023-01-23 ENCOUNTER — Encounter (HOSPITAL_COMMUNITY): Payer: Self-pay | Admitting: Internal Medicine

## 2023-01-23 ENCOUNTER — Encounter: Payer: Self-pay | Admitting: Internal Medicine

## 2023-01-23 ENCOUNTER — Other Ambulatory Visit: Payer: Self-pay

## 2023-01-23 NOTE — Progress Notes (Signed)
COVID Vaccine Completed: Yes  Date of COVID positive in last 90 days:  No  PCP - Hillard Danker, MD Cardiologist - Yvonne Kendall, MD  Chest x-ray -  EKG - 07-20-22 Epic Stress Test - 01-01-04 Epic ECHO - 08-16-19 Epic Cardiac Cath -  Pacemaker/ICD device last checked: Spinal Cord Stimulator: Long Term Monitor - 07-10-20 Epic Coronary CT - 11-25-16 Epic  Bowel Prep - Pt has meds and instructions  Sleep Study - Yes, neg CPAP -   Fasting Blood Sugar - N/A Checks Blood Sugar _____ times a day  Last dose of GLP1 agonist-  N/A GLP1 instructions:  N/A   Last dose of SGLT-2 inhibitors-  N/A SGLT-2 instructions: N/A   Blood Thinner Instructions:  Eliquis.  Patient is to take last dose on 01-27-23 per patient Aspirin Instructions: Last Dose:  Activity level:  Patient has mobility issues and uses a wheelchair.  Able to perform activities of daily living without stopping and without symptoms of chest pain or shortness of breath.  Anesthesia review:  PSVT, PVCs, hx of chest pain with myocardial bridging. Hx of PE  Patient denies shortness of breath, fever, cough and chest pain at PAT appointment (completed over the phone)  Patient verbalized understanding of instructions that were given to them at the PAT appointment. Patient was also instructed that they will need to review over the PAT instructions again at home before surgery.

## 2023-01-24 NOTE — Telephone Encounter (Signed)
Msg sent to DB to add to list to be contacted once [place] availability opens up.

## 2023-01-25 NOTE — Telephone Encounter (Signed)
Routing to Dr. Edward Jolly and Dr. Michela Pitcher.   Encounter closed.

## 2023-02-01 ENCOUNTER — Ambulatory Visit: Payer: 59 | Admitting: Obstetrics and Gynecology

## 2023-02-01 ENCOUNTER — Encounter: Payer: Self-pay | Admitting: Obstetrics and Gynecology

## 2023-02-01 VITALS — BP 118/70 | HR 57

## 2023-02-01 DIAGNOSIS — N950 Postmenopausal bleeding: Secondary | ICD-10-CM

## 2023-02-01 MED ORDER — LEVONORGESTREL 20 MCG/DAY IU IUD: 1.0000 | INTRAUTERINE_SYSTEM | Freq: Once | INTRAUTERINE | Status: DC

## 2023-02-01 NOTE — Progress Notes (Signed)
GYNECOLOGY  VISIT   HPI: Patient presents for surgical consult for myosure D&C, ECC removal of mirena IUD and placement of new mirena IUD  62 y.o.   Married  Caucasian  female   [id number] with Patient's last menstrual period was 05/09/2010 (approximate).  My Chart video visit to discuss U/S results for postmenopausal bleeding.    Hx simple endometrial hyperplasia, dx 04/11/19 at [facility].  She thinks she may have had long standing hyperplasia in her 64s.   She had middle lower back pain and some tiny streak of discharge with black flecks in it that prompted her to contact to the office through My Chart on 01/01/23.  She states her bleeding stopped over a week ago. Maybe some spotting last night and then this am.   She has decreased mobility and difficulty with pelvic examinations.   Asking about her estrogen levels.  Last estradiol level 05/21/20:  62.   Her liver enzymes increased on oral progesterone.    She has had the mirena now for 2 years and lining went to 6mm and in the lower uterine segment on last [place]. She would like to move forward with the [facility]. She has history of PE with worsening SOB after. She is being followed with cardiology and hematology for the eliquis She cannot tolerate pelvic exams and reports she would like anesthesia for the procedure.  GYNECOLOGIC HISTORY: Patient's last menstrual period was 05/09/2010 (approximate). Contraception:  PMP/ IUD--Mirena 07/27/20 Menopausal hormone therapy:  n/a Last mammogram:  02/21/22 Breast Density Category A, BI-RADS CATEGORY 1 neg  Last pap smear:   06/20/19 neg: HR HPV neg         OB History     Gravida  0   Para  0   Term  0   Preterm  0   AB  0   Living  0      SAB  0   IAB  0   Ectopic  0   Multiple  0   Live Births  0              Patient Active Problem List   Diagnosis Date Noted   Chronic pancolonic ulcerative colitis (HCC)    Benign neoplasm of cecum    Benign neoplasm of  transverse colon    Benign neoplasm of sigmoid colon    Benign neoplasm of rectosigmoid junction    History of colonic polyps 09/01/2020   Hyperlipidemia 02/27/2020   Head pain 10/25/2019   Elevated LFTs 10/25/2019   NSVT (nonsustained ventricular tachycardia) (HCC) 07/10/2019   Simple endometrial hyperplasia 06/23/2019   Postmenopausal bleeding 04/03/2019   Cough variant asthma vs UACS from gerd 01/01/2018   PVC (premature ventricular contraction) 08/17/2017   PSVT (paroxysmal supraventricular tachycardia) 08/17/2017   Leg edema 05/19/2017   Other fatigue 12/10/2016   Myocardial bridge 12/10/2016   Coronary artery calcification 12/10/2016   History of pulmonary embolism 12/10/2016   GERD (gastroesophageal reflux disease) 07/12/2016   Dyspnea on exertion 05/31/2016   Chronic venous insufficiency 05/31/2016   Routine general medical examination at a health care facility 06/27/2014   Left medial knee pain 07/17/2013   Adrenal incidentaloma (HCC) 03/25/2013   Allergic rhinitis 09/13/2011   B12 deficiency 11/28/2008   Vitamin D deficiency 06/30/2008   Palpitation 06/30/2008   Morbid obesity (HCC) 10/15/2007   Anxiety about health 08/23/2007   Cardiac dysrhythmia 08/23/2007    Past Medical History:  Diagnosis Date   Adenomatous colon polyp  Adrenal nodule (HCC)    Allergy    Anemia    in her early twenties   Anxiety    Arthritis    knees,thumbs   Cardiac arrhythmia    palpitations,PVC'sAC   Cataract    bilateral removed    Complication of anesthesia    BP dropped with a colonoscopy in [place]. no problems since.   Cyst of kidney, acquired    cyst right kidney- benign   Degenerative disc disease, lumbar    Dyspnea    Dysrhythmia    palpitations   Edema    Gallstones    History of anemia    20 yrs. ago not now- pt denies    History of hiatal hernia    IUD (intrauterine device) in place    Morbid obesity (HCC)    Osteoarthritis    Other allergy, other than to  medicinal agents    Panic disorder    Pulmonary embolism (HCC)    Run of atrial premature complexes    Ulcerative colitis, unspecified    Vitamin B12 deficiency    Vitamin D deficiency    Wheelchair bound     Past Surgical History:  Procedure Laterality Date   BIOPSY  11/17/2020   Procedure: BIOPSY;  Surgeon: Beverley Fiedler, MD;  Location: [facility];  Service: Gastroenterology;;   BREAST BIOPSY  11/2011   Left- benign   CATARACT EXTRACTION Right    CATARACT EXTRACTION Left    CHOLECYSTECTOMY N/A 01/17/2014   Procedure: LAPAROSCOPIC CHOLECYSTECTOMY;  Surgeon: Glenna Fellows, MD;  Location: [facility];  Service: General;  Laterality: N/A;   COLONOSCOPY     COLONOSCOPY WITH PROPOFOL N/A 11/17/2020   Procedure: COLONOSCOPY WITH PROPOFOL;  Surgeon: Beverley Fiedler, MD;  Location: [facility];  Service: Gastroenterology;  Laterality: N/A;   DILATATION & CURETTAGE/HYSTEROSCOPY WITH MYOSURE N/A 12/23/2019   Procedure: DILATATION & CURETTAGE, HYSTEROSCOPY WITH MYOSURE, RESECTION OF POLYPOID TISSUE;  Surgeon: Jerene Bears, MD;  Location: [facility] OR;  Service: Gynecology;  Laterality: N/A;   HYSTEROSCOPY WITH D & C N/A 07/27/2020   Procedure: DILATATION AND CURETTAGE /HYSTEROSCOPY;  Surgeon: Patton Salles, MD;  Location: [facility];  Service: Gynecology;  Laterality: N/A;   INTRAUTERINE DEVICE (IUD) INSERTION N/A 07/27/2020   Procedure: INTRAUTERINE DEVICE (IUD) INSERTION MIRENA;  Surgeon: Patton Salles, MD;  Location: [facility] OR;  Service: Gynecology;  Laterality: N/A;   POLYPECTOMY  11/17/2020   Procedure: POLYPECTOMY;  Surgeon: Beverley Fiedler, MD;  Location: [facility];  Service: Gastroenterology;;   RETINAL LASER PROCEDURE Left    SIGMOIDOSCOPY     SUBMUCOSAL TATTOO INJECTION  11/17/2020   Procedure: SUBMUCOSAL TATTOO INJECTION;  Surgeon: Beverley Fiedler, MD;  Location: [facility];  Service: Gastroenterology;;   TOOTH EXTRACTION      Current Outpatient Medications   Medication Sig Dispense Refill   acetaminophen (TYLENOL) 500 MG tablet Take 1,000 mg by mouth every 6 (six) hours as needed for moderate pain or headache.     ALPRAZolam (XANAX) 1 MG tablet TAKE 1/2 TO 1 TABLET(0.5 TO 1 MG) BY MOUTH TWICE DAILY AS NEEDED FOR ANXIETY (Patient taking differently: Takes in the afternoon) 60 tablet 5   atenolol (TENORMIN) 25 MG tablet Take 12.5 mg by mouth 2 (two) times daily. Takes in the afternoon     cholecalciferol (VITAMIN D3) 25 MCG (1000 UNIT) tablet Take 2,000 Units by mouth daily.     cyanocobalamin 1000 MCG tablet Take 1,000  mcg by mouth daily.     ELIQUIS 5 MG TABS tablet TAKE 1 TABLET(5 MG) BY MOUTH TWICE DAILY 60 tablet 6   levonorgestrel (MIRENA, 52 MG,) 20 MCG/24HR IUD 1 each by Intrauterine route once.     Na Sulfate-K Sulfate-Mg Sulf 17.5-3.13-1.6 GM/177ML SOLN Take by mouth once. (Patient not taking: Reported on 02/01/2023)     No current facility-administered medications for this visit.     ALLERGIES: Diphenhydramine hcl, Doxycycline, Hydrocodone, Sudafed [pseudoephedrine hcl], Azithromycin, Chlorhexidine gluconate, Ibuprofen, Lactose intolerance (gi), Mesalamine, Caffeine, Codeine, Other, Penicillins, and Prednisone  Family History  Problem Relation Age of Onset   Heart disease Father 37       pacemaker   Atrial fibrillation Father    Prostate cancer Father    Hypertension Maternal Grandfather    Hypertension Maternal Aunt    Endometrial cancer Maternal Aunt    Cancer Maternal Aunt    Colon cancer Neg Hx    Stomach cancer Neg Hx    Colon polyps Neg Hx    Esophageal cancer Neg Hx    Rectal cancer Neg Hx    Ovarian cancer Neg Hx    Pancreatic cancer Neg Hx    Breast cancer Neg Hx     Social History   Socioeconomic History   Marital status: Married    Spouse name: Not on file   Number of children: 0   Years of education: Not on file   Highest education level: Not on file  Occupational History   Occupation: Lexicographer: UNEMPLOYED  Tobacco Use   Smoking status: Former    Current packs/day: 0.00    Average packs/day: 2.0 packs/day for 5.0 years (10.0 ttl pk-yrs)    Types: Cigarettes    Start date: 03/06/1974    Quit date: 03/07/1979    Years since quitting: 43.9   Smokeless tobacco: Never  Vaping Use   Vaping status: Never Used  Substance and Sexual Activity   Alcohol use: No    Alcohol/week: 0.0 standard drinks of alcohol   Drug use: No   Sexual activity: Not Currently    Partners: Male    Birth control/protection: Post-menopausal  Other Topics Concern   Not on file  Social History Narrative   Married: 0 kids   Regular exercise: walks 2 times a week   Caffeine use: none   Social Determinants of Corporate investment banker Strain: Not on file  Food Insecurity: Not on file  Transportation Needs: Not on file  Physical Activity: Not on file  Stress: Not on file  Social Connections: Not on file  Intimate Partner Violence: Not on file    Review of Systems  All other systems reviewed and are negative.   PHYSICAL EXAMINATION:    BP 118/70   Pulse (!) 57   LMP 05/09/2010 (Approximate)   SpO2 98%     General appearance: alert, cooperative and appears stated age  Study Result  Narrative & Impression  CLINICAL DATA:  Postmenopausal bleeding. Patient had Mirena IUD placed 07/27/2020.   EXAM: TRANSABDOMINAL AND TRANSVAGINAL ULTRASOUND OF PELVIS   TECHNIQUE: Both transabdominal and transvaginal ultrasound examinations of the pelvis were performed. Transabdominal technique was performed for global imaging of the pelvis including uterus, ovaries, adnexal regions, and pelvic cul-de-sac. It was necessary to proceed with endovaginal exam following the transabdominal exam to visualize the uterus, endometrium ovaries, and adnexal regions.   COMPARISON:  05/21/2020 and CT of the abdomen and pelvis 06/10/2020  FINDINGS: Uterus   Measurements: 7.8 x 3.6 x 4.0 centimeters =  volume: 57.8 mL. No fibroids or other mass visualized.   Endometrium   Thickness: 6.0 millimeters. New intrauterine device is difficult to visualized. However, the device is likely to be within the LOWER uterine segment.   Right ovary   Measurements: Not visualized   Left ovary   Measurements: 1.7 x 1.6 x 1.8 centimeters = volume: 2.5 mL. Normal appearance/no adnexal mass.   Other findings   Study quality is degraded by patient body habitus.   IMPRESSION: 1. Intrauterine device is difficult to visualized. However, the device is likely to be within the LOWER uterine segment. 2. Endometrial stripe measures 6 mm. 3. Nonvisualized RIGHT ovary.     Electronically Signed   By: Norva Pavlov M.D.   On: 01/12/2023 16:32     ASSESSMENT  Potential postmenopausal bleeding.  Hx simple endometrial hyperplasia treated with Mirena IUD.  Recent US with EMS 6 mm and IUD potentially low in the endometrial canal.  Difficult visualization.   PLAN  The hysteroscopy with the [facility] was reviewed and discussed in detail with the patient. She would like to have the Mirena IUD removed and a new one placed at the same time.  Discussed leaving this in for 8 years if at all possible and if remains in the good location.  She will do preop at the day of surgery.  Discussed risk for bleeding after the procedure anywhere from 7 to 10 days. She has had a prior consult with GYN oncology.  Discussed after this procedure if she continues to have postmenopausal bleeding that she will need to do an additional consult with them.  Patient is a poor surgical candidate but at that point may need to explore having the hysterectomy. Patient will have medical and cardiac clearance and will have her hematologist manage the Eliquis before and after surgery  30 minutes spent on reviewing records, imaging,  and one on one patient time and counseling patient and documentation Dr. Karma Greaser

## 2023-02-02 ENCOUNTER — Encounter: Payer: Self-pay | Admitting: Obstetrics and Gynecology

## 2023-02-02 ENCOUNTER — Encounter: Payer: Self-pay | Admitting: Family

## 2023-02-06 NOTE — Telephone Encounter (Signed)
Request faxed to Dr. Myna Hidalgo requesting recommendations for Eliquis.  Fax confirmed.   02/01/23 encounter updated to include pt reported height.

## 2023-02-07 ENCOUNTER — Telehealth: Payer: Self-pay

## 2023-02-07 ENCOUNTER — Telehealth: Payer: Self-pay | Admitting: *Deleted

## 2023-02-07 NOTE — Telephone Encounter (Signed)
Patient agreeable with virtual appointment. Med list reviewed and consent given.

## 2023-02-07 NOTE — Telephone Encounter (Signed)
  Patient Consent for Virtual Visit         Cassie [name] has provided verbal consent on 02/07/2023 for a virtual visit (video or telephone).   CONSENT FOR VIRTUAL VISIT FOR:  Cassie [name]  By participating in this virtual visit I agree to the following:  I hereby voluntarily request, consent and authorize [facility] and its employed or contracted physicians, physician assistants, nurse practitioners or other licensed health care professionals (the Practitioner), to provide me with telemedicine health care services (the "Services") as deemed necessary by the treating Practitioner. I acknowledge and consent to receive the Services by the Practitioner via telemedicine. I understand that the telemedicine visit will involve communicating with the Practitioner through live audiovisual communication technology and the disclosure of certain medical information by electronic transmission. I acknowledge that I have been given the opportunity to request an in-person assessment or other available alternative prior to the telemedicine visit and am voluntarily participating in the telemedicine visit.  I understand that I have the right to withhold or withdraw my consent to the use of telemedicine in the course of my care at any time, without affecting my right to future care or treatment, and that the Practitioner or I may terminate the telemedicine visit at any time. I understand that I have the right to inspect all information obtained and/or recorded in the course of the telemedicine visit and may receive copies of available information for a reasonable fee.  I understand that some of the potential risks of receiving the Services via telemedicine include:  Delay or interruption in medical evaluation due to technological equipment failure or disruption; Information transmitted may not be sufficient (e.g. poor resolution of images) to allow for appropriate medical decision making by the  Practitioner; and/or  In rare instances, security protocols could fail, causing a breach of personal health information.  Furthermore, I acknowledge that it is my responsibility to provide information about my medical history, conditions and care that is complete and accurate to the best of my ability. I acknowledge that Practitioner's advice, recommendations, and/or decision may be based on factors not within their control, such as incomplete or inaccurate data provided by me or distortions of diagnostic images or specimens that may result from electronic transmissions. I understand that the practice of medicine is not an exact science and that Practitioner makes no warranties or guarantees regarding treatment outcomes. I acknowledge that a copy of this consent can be made available to me via my patient portal [facility] MyChart), or I can request a printed copy by calling the office of [facility].    I understand that my insurance will be billed for this visit.   I have read or had this consent read to me. I understand the contents of this consent, which adequately explains the benefits and risks of the Services being provided via telemedicine.  I have been provided ample opportunity to ask questions regarding this consent and the Services and have had my questions answered to my satisfaction. I give my informed consent for the services to be provided through the use of telemedicine in my medical care

## 2023-02-07 NOTE — Telephone Encounter (Signed)
Refer to surgery referral.   Routing to Dr. Michela Pitcher.   Encounter closed.

## 2023-02-07 NOTE — Telephone Encounter (Signed)
   Name: Cassie [name]  DOB: 02/27/1961  MRN: [id number]  Primary Cardiologist: Yvonne Kendall, MD   Preoperative team, please contact this patient and set up a phone call appointment for further preoperative risk assessment. Please obtain consent and complete medication review. Thank you for your help.  I confirm that guidance regarding antiplatelet and oral anticoagulation therapy has been completed and, if necessary, noted below.  Patient is on chronic anticoagulation with Eliquis which is managed by Dr. Myna Hidalgo.  I also confirmed the patient resides in the state of [place]. As per [facility] Medical Board telemedicine laws, the patient must reside in the state in which the provider is licensed.   Napoleon Form, Leodis Rains, NP 02/07/2023, 1:26 PM [facility]

## 2023-02-07 NOTE — Telephone Encounter (Signed)
Patient is planning for Hysteroscopy D&C Myosure, ECC and Mirena IUD exchange on 03/13/23. Will patient need a repeat PUS?   Cc: Dr. Karma Greaser

## 2023-02-07 NOTE — Telephone Encounter (Signed)
Encounter reviewed and closed.  

## 2023-02-07 NOTE — Telephone Encounter (Signed)
   Pre-operative Risk Assessment    Patient Name: Cassie [name]  DOB: [birth year] MRN: [id number]      Request for Surgical Clearance    Procedure:   Hysteroscopy D & C myosure,ECC and Mirena IUD exchange  Date of Surgery:  Clearance 03/13/23                                 Surgeon:  Dr. Laurell Roof Group or Practice Name:  [facility] Phone number:  [phone] Fax number:  [phone]   Type of Clearance Requested:   - Medical  - Pharmacy:  Hold Apixaban (Eliquis) Not Indicated.   Type of Anesthesia:   IV regional sedation   Additional requests/questions:    Signed, Emmit Pomfret   02/07/2023, 1:05 PM

## 2023-02-27 ENCOUNTER — Telehealth: Payer: Self-pay | Admitting: *Deleted

## 2023-02-27 NOTE — Telephone Encounter (Signed)
Patient called. She has stopped spotting, but feels like she should do more cardiac clearance and medical clearance before the [facility] D&C. Has SOB with brushing her teeth. She states she will come in with any pain or heavy bleeding. I agree to cancel surgery and we will move with the case in the new year Dr. Karma Greaser

## 2023-02-27 NOTE — Telephone Encounter (Signed)
See above

## 2023-02-27 NOTE — Telephone Encounter (Signed)
Message received from business office. Patient request return call to further discuss surgery planned for 03/13/23.   Patient expressed second thoughts regarding surgery, does not feel at ease with surgery scheduled for 03/13/23. Reports increased SHOB and decreased mobility. Has phone appt with Cardiology phone visit on 02/28/23, cardiac clearance pending. Patient wants further cardiac testing and pulmonary evaluation prior to surgery.   Recommended further discussion with Dr. Karma Greaser, patient agreeable. MyChart visit 10/23 at 3pm. Patient does not want to cancel surgery until she further discusses with Dr. Karma Greaser. Advised will wait for MyChart visit and update from Dr. Karma Greaser. Patient agreeable.   Routing to Dr. Michela Pitcher.

## 2023-02-28 ENCOUNTER — Ambulatory Visit: Payer: 59 | Attending: Cardiology

## 2023-02-28 DIAGNOSIS — Z01810 Encounter for preprocedural cardiovascular examination: Secondary | ICD-10-CM

## 2023-02-28 NOTE — Telephone Encounter (Addendum)
Pt has been scheduled to see Ronie Spies, [facility] 03/17/23 @ 8:50. Pt asked to be seen in the [place] location. I will update all parties involved.   UPDATE ON SURGEON'S NAME:   DR. Arman Filter

## 2023-02-28 NOTE — Telephone Encounter (Signed)
Spoke with [facility] in [employer]. Case cancelled.   Patient notified. Surgery referral closed.  MyChart visit cancelled for 03/01/23.

## 2023-02-28 NOTE — Progress Notes (Signed)
Virtual Visit via Telephone Note   Because of [name]'s co-morbid illnesses, she is at least at moderate risk for complications without adequate follow up.  This format is felt to be most appropriate for this patient at this time.  The patient did not have access to video technology/had technical difficulties with video requiring transitioning to audio format only (telephone).  All issues noted in this document were discussed and addressed.  No physical exam could be performed with this format.  Please refer to the patient's chart for her consent to telehealth for [facility]  Evaluation Performed:  Preoperative cardiovascular risk assessment _____________   Date:  02/28/2023   Patient ID:  Cassie [name], DOB Oct 04, 1960, MRN [id number] Patient Location:  Home Provider location:   Office  Primary Care Provider:  Myrlene Broker, MD Primary Cardiologist:  Yvonne Kendall, MD  Chief Complaint / Patient Profile   62 y.o. y/o female with a h/o palpitations, myocardial bridge, lower extremity edema who is pending hysteroscopy D&C MyoSure, ECC, and Mirena IUD exchange and presents today for telephonic preoperative cardiovascular risk assessment.  History of Present Illness    Cassie [name] is a 62 y.o. female who presents via audio/video conferencing for a telehealth visit today.  Pt was last seen in cardiology clinic on 07/20/2022 by Dr. Okey Dupre.  At that time Cassie [name] was doing well .  The patient is now pending procedure as outlined above. Since her last visit, she has decreased mobility, weight gain and increased shortness of breath. She also reports increased lower extremity swelling.      Past Medical History    Past Medical History:  Diagnosis Date   Adenomatous colon polyp    Adrenal nodule (HCC)    Allergy    Anemia    in her early twenties   Anxiety    Arthritis    knees,thumbs   Cardiac arrhythmia    palpitations,PVC'sAC   Cataract     bilateral removed    Complication of anesthesia    BP dropped with a colonoscopy in [place]. no problems since.   Cyst of kidney, acquired    cyst right kidney- benign   Degenerative disc disease, lumbar    Dyspnea    Dysrhythmia    palpitations   Edema    Gallstones    History of anemia    20 yrs. ago not now- pt denies    History of hiatal hernia    IUD (intrauterine device) in place    Morbid obesity (HCC)    Osteoarthritis    Other allergy, other than to medicinal agents    Panic disorder    Pulmonary embolism (HCC)    Run of atrial premature complexes    Ulcerative colitis, unspecified    Vitamin B12 deficiency    Vitamin D deficiency    Wheelchair bound    Past Surgical History:  Procedure Laterality Date   BIOPSY  11/17/2020   Procedure: BIOPSY;  Surgeon: Beverley Fiedler, MD;  Location: [facility];  Service: Gastroenterology;;   BREAST BIOPSY  11/2011   Left- benign   CATARACT EXTRACTION Right    CATARACT EXTRACTION Left    CHOLECYSTECTOMY N/A 01/17/2014   Procedure: LAPAROSCOPIC CHOLECYSTECTOMY;  Surgeon: Glenna Fellows, MD;  Location: [facility];  Service: General;  Laterality: N/A;   COLONOSCOPY     COLONOSCOPY WITH PROPOFOL N/A 11/17/2020   Procedure: COLONOSCOPY WITH PROPOFOL;  Surgeon: Beverley Fiedler, MD;  Location:  [facility];  Service: Gastroenterology;  Laterality: N/A;   DILATATION & CURETTAGE/HYSTEROSCOPY WITH MYOSURE N/A 12/23/2019   Procedure: DILATATION & CURETTAGE, HYSTEROSCOPY WITH MYOSURE, RESECTION OF POLYPOID TISSUE;  Surgeon: Jerene Bears, MD;  Location: [facility] OR;  Service: Gynecology;  Laterality: N/A;   HYSTEROSCOPY WITH D & C N/A 07/27/2020   Procedure: DILATATION AND CURETTAGE /HYSTEROSCOPY;  Surgeon: Patton Salles, MD;  Location: [facility] OR;  Service: Gynecology;  Laterality: N/A;   INTRAUTERINE DEVICE (IUD) INSERTION N/A 07/27/2020   Procedure: INTRAUTERINE DEVICE (IUD) INSERTION MIRENA;  Surgeon: Patton Salles, MD;  Location:  [facility] OR;  Service: Gynecology;  Laterality: N/A;   POLYPECTOMY  11/17/2020   Procedure: POLYPECTOMY;  Surgeon: Beverley Fiedler, MD;  Location: [facility];  Service: Gastroenterology;;   RETINAL LASER PROCEDURE Left    SIGMOIDOSCOPY     SUBMUCOSAL TATTOO INJECTION  11/17/2020   Procedure: SUBMUCOSAL TATTOO INJECTION;  Surgeon: Beverley Fiedler, MD;  Location: [facility];  Service: Gastroenterology;;   TOOTH EXTRACTION      Allergies  Allergies  Allergen Reactions   Diphenhydramine Hcl Swelling    Throat feels like it swells    Doxycycline Palpitations   Hydrocodone Other (See Comments)    Hallucinations    Sudafed [Pseudoephedrine Hcl] Shortness Of Breath   Azithromycin Palpitations    Speeding heart rate per pt.    Chlorhexidine Gluconate Itching    Burning   Ibuprofen Itching   Lactose Intolerance (Gi) Diarrhea and Other (See Comments)    bloating   Mesalamine Other (See Comments)    Hair loss   Caffeine Palpitations   Codeine Other (See Comments)   Other Other (See Comments)    Fillers in some medication/ Heart palpitations Gi upset   Penicillins Rash    Has patient had a PCN reaction causing immediate rash, facial/tongue/throat swelling, SOB or lightheadedness with hypotension: Yes Has patient had a PCN reaction causing severe rash involving mucus membranes or skin necrosis: No Has patient had a PCN reaction that required hospitalization Yes Has patient had a PCN reaction occurring within the last 10 years: No If all of the above answers are "NO", then may proceed with Cephalosporin use.   Prednisone Palpitations    Home Medications    Prior to Admission medications   Medication Sig Start Date End Date Taking? Authorizing Provider  acetaminophen (TYLENOL) 500 MG tablet Take 1,000 mg by mouth every 6 (six) hours as needed for moderate pain or headache.    [provider]  ALPRAZolam (XANAX) 1 MG tablet TAKE 1/2 TO 1 TABLET(0.5 TO 1 MG) BY MOUTH TWICE DAILY AS  NEEDED FOR ANXIETY Patient taking differently: Takes in the afternoon 10/04/22   Myrlene Broker, MD  atenolol (TENORMIN) 50 MG tablet Take 12.5 mg by mouth 2 (two) times daily.    [provider]  cholecalciferol (VITAMIN D3) 25 MCG (1000 UNIT) tablet Take 2,000 Units by mouth daily.    [provider]  cyanocobalamin 1000 MCG tablet Take 1,000 mcg by mouth daily.    [provider]  ELIQUIS 5 MG TABS tablet TAKE 1 TABLET(5 MG) BY MOUTH TWICE DAILY 12/02/22   Josph Macho, MD  levonorgestrel (MIRENA, 52 MG,) 20 MCG/24HR IUD 1 each by Intrauterine route once.    [provider]  Na Sulfate-K Sulfate-Mg Sulf 17.5-3.13-1.6 GM/177ML SOLN Take by mouth once. 11/11/22   [provider]    Physical Exam  Vital Signs:  Cassie [name] does not have vital signs available for review today.  Given telephonic nature of communication, physical exam is limited. AAOx3. NAD. Normal affect.  Speech and respirations are unlabored.  Accessory Clinical Findings    None  Assessment & Plan    1.  Preoperative Cardiovascular Risk Assessment: Hysteroscopy D&C with MyoSure, ECC and Mirena IUD exchange, Dr. Lanora Manis, fax number [phone]      Will require in office visit prior to recommendations can be made for up coming surgery.        Time:   Today, I have spent 10 minutes with the patient with telehealth technology discussing medical history, symptoms, and management plan.  Prior to patient's phone evaluation I spent greater than 10 minutes reviewing their past medical history and cardiac medications.    Ronney Asters, NP  02/28/2023, 7:11 AM

## 2023-02-28 NOTE — Progress Notes (Signed)
Pt has been scheduled to see Ronie Spies, [facility] 03/17/23 @ 8:50. Pt asked to be seen in the [place] location. I will update all parties involved.

## 2023-02-28 NOTE — Telephone Encounter (Signed)
See telephone encounter dated 02/27/23.

## 2023-03-01 ENCOUNTER — Telehealth: Payer: 59 | Admitting: Obstetrics and Gynecology

## 2023-03-13 ENCOUNTER — Ambulatory Visit (HOSPITAL_COMMUNITY): Admit: 2023-03-13 | Payer: 59 | Admitting: Obstetrics and Gynecology

## 2023-03-13 SURGERY — DILATATION & CURETTAGE/HYSTEROSCOPY WITH MYOSURE
Anesthesia: Regional

## 2023-03-14 ENCOUNTER — Encounter: Payer: Self-pay | Admitting: Physician Assistant

## 2023-03-14 NOTE — Progress Notes (Addendum)
Cardiology Office Note    Date:  03/17/2023  ID:  [name], DOB 05-02-1961, MRN [id number] PCP:  Myrlene Broker, MD  Cardiologist:  Yvonne Kendall, MD  Electrophysiologist:  None   Chief Complaint: preop eval, dyspnea on exertion  History of Present Illness: .    Cassie [name] is a 62 y.o. female with visit-pertinent history of PSVT, PVCs, chest pain with nonobstructive CAD and myocardial bridging in mid LAD, chronic edema unresponsive to diuretics felt possibly due to lymphedema, PE on chronic anticoagulation managed by Dr. Myna Hidalgo, ulcerative colitis, vitamin B12 deficiency, vitamin D deficiency, anxiety, panic disorder, morbid obesity, mild OSA by prior sleep study (pt states not requiring CPAP), gallstones, hiatal hernia, post-menopausal bleeding with endometrial hyperplasia treated with Mirena IUD. She is typically in wheelchair.  Remote nuc 2005 was normal. Cor CT 2018 showed CAC 54 (89%ile) with mild nonobstructive CAD in RCA and LAD with myocardial bridge in mLAD. She has worn several monitors over the years demonstrating PACs, PVCs, PSVT, NSVT, AIVR. Last monitor 07/2020 showed rare PACs, PVCs, 19 brief runs SVT (max 8 beats). She has been managed on atenolol. She also has history of LE edema, Dr. Okey Dupre previously recommended f/u PCP for management of lymphedema and the patient states she's gotten nowhere with this. Prior notes indicate this was unresponsive to diuretics; she denies being on chronic diuretic therapy. Last echo 2021 showed EF 60-65%, G1DD, mild LAE. She is now pending hysteroscopy D & C myosure, ECC and Mirena IUD exchange regional sedation. She saw GYN who anticipated replacing her previous Mirena with a new one. Their note also states, "She has had a prior consult with GYN oncology.  Discussed after this procedure if she continues to have postmenopausal bleeding that she will need to do an additional consult with them.  Patient is a poor surgical candidate but  at that point may need to explore having the hysterectomy. " Cardiology virtual visit was attempted for pre-op eval but she reports symptoms prompting in person OV. She is primarily in wheelchair.  She returns for follow-up reporting progressive DOE, increased LE edema, and increased palpitations for the last several months superirmposed on long history of this. Reports she saw [place] cardiology back to the 1990s. No anginal type chest pain, has occasional fleeting soreness in her chest. She has felt dismissed in the past due to her weight. She has struggled with continued weight gain. She reports she does not think she would do well with GLP1 due to her UC/chronic diarrhea and that most healthful foods also cause GI upset.   Labwork independently reviewed: 09/2022 K 4.5, Cr 0.90, LFTs ok, AST low, ALT OK 07/2022 TSH wnl, Mg wnl  ROS: .    Please see the history of present illness.  All other systems are reviewed and otherwise negative.  Studies Reviewed: Marland Kitchen    EKG:  EKG is ordered today, personally reviewed, demonstrating NSR/sinus arrhythmia 62bpm with nonspecific STTW changes unchanged from prior  CV Studies: Cardiac studies reviewed are outlined and summarized above. Otherwise please see EMR for full report.   Current Reported Medications:.    Current Meds  Medication Sig   acetaminophen (TYLENOL) 500 MG tablet Take 1,000 mg by mouth every 6 (six) hours as needed for moderate pain or headache.   ALPRAZolam (XANAX) 1 MG tablet TAKE 1/2 TO 1 TABLET(0.5 TO 1 MG) BY MOUTH TWICE DAILY AS NEEDED FOR ANXIETY (Patient taking differently: Takes in the afternoon)   atenolol (  TENORMIN) 50 MG tablet Take 12.5 mg by mouth 2 (two) times daily.   cholecalciferol (VITAMIN D3) 25 MCG (1000 UNIT) tablet Take 2,000 Units by mouth daily.   cyanocobalamin 1000 MCG tablet Take 1,000 mcg by mouth daily.   ELIQUIS 5 MG TABS tablet TAKE 1 TABLET(5 MG) BY MOUTH TWICE DAILY   levonorgestrel (MIRENA, 52 MG,) 20  MCG/24HR IUD 1 each by Intrauterine route once.     Physical Exam:    VS:  BP 112/70   Pulse 66   Ht 5\' 5"  (1.651 m)   Wt (!) 350 lb (158.8 kg)   LMP 05/09/2010 (Approximate)   SpO2 99%   BMI 58.24 kg/m    Wt Readings from Last 3 Encounters:  03/17/23 (!) 350 lb (158.8 kg)  08/16/22 (!) 340 lb (154.2 kg)  07/20/22 (!) 330 lb (149.7 kg)    GEN: Well nourished, well developed in no acute distress NECK: No JVD; No carotid bruits CARDIAC: RRR, no murmurs, rubs, gallops RESPIRATORY:  Clear to auscultation without rales, wheezing or rhonchi  ABDOMEN: Soft, non-tender, non-distended EXTREMITIES:  chronic appearing lymphedema superimposed on large baseline leg habitus, No acute deformity   Asessement and Plan:.    1. Preop cardiovascular evaluation with dyspnea on exertion, history of mild nonobstructive CAD and myocardial bridging - patient unable to complete 4 METS without dyspnea. Suspect multifactorial. Will update echocardiogram and cardiac CT to further evaluate. Update basic labs. Take metoprolol 100mg  x 1 day of CT in place of scheduled atenolol. Will route note to requesting GYN surgeon as well as GI as she also appears to have a colonoscopy coming up, just to keep them apprised that further cardiac workup in progress. Recommend to send them update upon completion of testing. I'll plan to see her back in follow-up after all her testing is complete. Regarding anticoagulation, she is on Eliquis at the direction of heme-onc so any anticoagulation recommendations should come from Dr. Myna Hidalgo.  2. Palpitations with hx of PSVT, PACs, PVCs, also brief NSVT/AIVR 2021 - update labs, 7 day Zio to assess burden of symptoms. Continue atenolol in the interim.  3. Chronic edema - agree likely component of lymphedema. Given body habitus cannot exclude component of HFpEF. Update labs and echo. Consider trial of torsemide if labs OK.  4. Severe obesity - we discussed concern this is contributing to  her symptoms but certainly want to proceed with evaluation as above to ensure cardiac status OK. She reports she does not think she would do well with GLP1 due to her UC/chronic diarrhea and that most healthful foods also cause GI upset. We discussed [facility] to [facility]. I asked her to let me know what she decides.    Disposition: F/u with me per patient request in 6 weeks (after completion of testing). Patient requests eventually to switch back to cardiologist in [place] as [place] is too far. Based on PMH suspect she may be a good fit to see Dr. Cristal Deer.  Signed, Laurann Montana, PA-C

## 2023-03-17 ENCOUNTER — Ambulatory Visit: Payer: 59 | Attending: Physician Assistant | Admitting: Physician Assistant

## 2023-03-17 ENCOUNTER — Encounter: Payer: Self-pay | Admitting: Physician Assistant

## 2023-03-17 ENCOUNTER — Ambulatory Visit (INDEPENDENT_AMBULATORY_CARE_PROVIDER_SITE_OTHER): Payer: 59

## 2023-03-17 VITALS — BP 112/70 | HR 66 | Ht 65.0 in | Wt 350.0 lb

## 2023-03-17 DIAGNOSIS — D649 Anemia, unspecified: Secondary | ICD-10-CM

## 2023-03-17 DIAGNOSIS — I471 Supraventricular tachycardia, unspecified: Secondary | ICD-10-CM | POA: Diagnosis not present

## 2023-03-17 DIAGNOSIS — R0609 Other forms of dyspnea: Secondary | ICD-10-CM

## 2023-03-17 DIAGNOSIS — I491 Atrial premature depolarization: Secondary | ICD-10-CM

## 2023-03-17 DIAGNOSIS — I493 Ventricular premature depolarization: Secondary | ICD-10-CM

## 2023-03-17 DIAGNOSIS — R609 Edema, unspecified: Secondary | ICD-10-CM

## 2023-03-17 DIAGNOSIS — I2510 Atherosclerotic heart disease of native coronary artery without angina pectoris: Secondary | ICD-10-CM

## 2023-03-17 DIAGNOSIS — Z01810 Encounter for preprocedural cardiovascular examination: Secondary | ICD-10-CM

## 2023-03-17 DIAGNOSIS — R002 Palpitations: Secondary | ICD-10-CM

## 2023-03-17 DIAGNOSIS — Q245 Malformation of coronary vessels: Secondary | ICD-10-CM

## 2023-03-17 MED ORDER — METOPROLOL TARTRATE 100 MG PO TABS: 100.0000 mg | ORAL_TABLET | Freq: Once | ORAL | 0 refills | Status: DC

## 2023-03-17 NOTE — Patient Instructions (Addendum)
Medication Instructions:  Your physician recommends that you continue on your current medications as directed. Please refer to the Current Medication list given to you today.  *If you need a refill on your cardiac medications before your next appointment, please call your pharmacy*   Lab Work: TODAY: BMET, MAGNESIUM, BNP, CBC, TSH   If you have labs (blood work) drawn today and your tests are completely normal, you will receive your results only by: MyChart Message (if you have MyChart) OR A paper copy in the mail If you have any lab test that is abnormal or we need to change your treatment, we will call you to review the results.   Testing/Procedures: Your physician has requested that you have an echocardiogram. Echocardiography is a painless test that uses sound waves to create images of your heart. It provides your doctor with information about the size and shape of your heart and how well your heart's chambers and valves are working. This procedure takes approximately one hour. There are no restrictions for this procedure. Please do NOT wear cologne, perfume, aftershave, or lotions (deodorant is allowed). Please arrive 15 minutes prior to your appointment time.  .  Your cardiac CT will be scheduled at one of the below locations:   [facility] [address] [place], [place] [address] [phone]  If scheduled at [facility], please arrive at the [facility] and Children's Entrance (Entrance C2) of [facility] 30 minutes prior to test start time. You can use the FREE valet parking offered at entrance C (encouraged to control the heart rate for the test)  Proceed to the [facility] Radiology Department (first floor) to check-in and test prep.  All radiology patients and guests should use entrance C2 at [facility], accessed from [facility], even though the hospital's physical address listed is [address]     Please  follow these instructions carefully (unless otherwise directed):  An IV will be required for this test and Nitroglycerin will be given.   On the Night Before the Test: Be sure to Drink plenty of water. Do not consume any caffeinated/decaffeinated beverages or chocolate 12 hours prior to your test. Do not take any antihistamines 12 hours prior to your test.  On the Day of the Test: Drink plenty of water until 1 hour prior to the test. Do not eat any food 1 hour prior to test. You may take your regular medications prior to the test.  Take metoprolol (Lopressor) 100 MG (1 TABLET) two hours prior to test. HOLD THE ATENOLOL THE MORNING OF TESTING.  If you take Furosemide/Hydrochlorothiazide/Spironolactone, please HOLD on the morning of the test. FEMALES- please wear underwire-free bra if available, avoid dresses & tight clothing  After the Test: Drink plenty of water. After receiving IV contrast, you may experience a mild flushed feeling. This is normal. On occasion, you may experience a mild rash up to 24 hours after the test. This is not dangerous. If this occurs, you can take Benadryl 25 mg and increase your fluid intake. If you experience trouble breathing, this can be serious. If it is severe call 911 IMMEDIATELY. If it is mild, please call our office. If you take any of these medications: Glipizide/Metformin, Avandament, Glucavance, please do not take 48 hours after completing test unless otherwise instructed.  We will call to schedule your test 2-4 weeks out understanding that some insurance companies will need an authorization prior to the service being performed.  For more information and frequently asked questions, please visit our website : [email or web address]  For non-scheduling related questions, please contact the cardiac imaging nurse navigator should you have any questions/concerns: Cardiac Imaging Nurse Navigators Direct Office Dial: [phone]   For  scheduling needs, including cancellations and rescheduling, please call [place], [phone].   Please note: We ask at that you not bring children with you during ultrasound (echo/ vascular) testing. Due to room size and safety concerns, children are not allowed in the ultrasound rooms during exams. Our front office staff cannot provide observation of children in our lobby area while testing is being conducted. An adult accompanying a patient to their appointment will only be allowed in the ultrasound room at the discretion of the ultrasound technician under special circumstances. We apologize for any inconvenience.    Follow-Up: At [facility], you and your health needs are our priority.  As part of our continuing mission to provide you with exceptional heart care, we have created designated Provider Care Teams.  These Care Teams include your primary Cardiologist (physician) and Advanced Practice Providers (APPs -  Physician Assistants and Nurse Practitioners) who all work together to provide you with the care you need, when you need it.  We recommend signing up for the patient portal called "MyChart".  Sign up information is provided on this After Visit Summary.  MyChart is used to connect with patients for Virtual Visits (Telemedicine).  Patients are able to view lab/test results, encounter notes, upcoming appointments, etc.  Non-urgent messages can be sent to your provider as well.   To learn more about what you can do with MyChart, go to [email or web address].    Your next appointment:   6 week(s)  Provider:   Ronie Spies, PA-C

## 2023-03-17 NOTE — Progress Notes (Unsigned)
Applied a 7 day Zio XT monitor to patient in the office  End to read

## 2023-03-18 LAB — BASIC METABOLIC PANEL
BUN/Creatinine Ratio: 10 — ABNORMAL LOW (ref 12–28)
BUN: 8 mg/dL (ref 8–27)
CO2: 23 mmol/L (ref 20–29)
Calcium: 9.2 mg/dL (ref 8.7–10.3)
Chloride: 103 mmol/L (ref 96–106)
Creatinine, Ser: 0.84 mg/dL (ref 0.57–1.00)
Glucose: 110 mg/dL — ABNORMAL HIGH (ref 70–99)
Potassium: 4.6 mmol/L (ref 3.5–5.2)
Sodium: 142 mmol/L (ref 134–144)
eGFR: 78 mL/min/{1.73_m2} (ref >59)

## 2023-03-18 LAB — CBC
Hematocrit: 43.2 % (ref 34.0–46.6)
Hemoglobin: 13.7 g/dL (ref 11.1–15.9)
MCH: 30.2 pg (ref 26.6–33.0)
MCHC: 31.7 g/dL (ref 31.5–35.7)
MCV: 95 fL (ref 79–97)
Platelets: 246 10*3/uL (ref 150–450)
RBC: 4.54 x10E6/uL (ref 3.77–5.28)
RDW: 12.5 % (ref 11.7–15.4)
WBC: 7.1 10*3/uL (ref 3.4–10.8)

## 2023-03-18 LAB — MAGNESIUM: Magnesium: 2.1 mg/dL (ref 1.6–2.3)

## 2023-03-18 LAB — BRAIN NATRIURETIC PEPTIDE: BNP: 79.9 pg/mL (ref 0.0–100.0)

## 2023-03-18 LAB — TSH: TSH: 4.4 u[IU]/mL (ref 0.450–4.500)

## 2023-03-20 ENCOUNTER — Encounter: Payer: Self-pay | Admitting: Internal Medicine

## 2023-03-20 ENCOUNTER — Telehealth: Payer: Self-pay

## 2023-03-20 NOTE — Telephone Encounter (Signed)
Please tell patient thanks for reaching out and I think that she should complete the cardiac evaluation in full before we do colonoscopy in the outpatient hospital setting Would reschedule the outpatient colonoscopy to a future date past December 2024 in the hospital setting

## 2023-03-20 NOTE — Telephone Encounter (Signed)
-----   Message from Laurann Montana sent at 03/20/2023  7:51 AM EST ----- Please let patient know labs are stable. I'd like to offer a trial of torsemide for her lower extremity edema, 20mg  once daily. Recheck BMET 1 week after starting, notify for any acute concerns or intolerances. If she feels she is very sensitive to medications, she can start it every other day but still get labwork same time frame - just let us know what she prefers.  I also saw she sent a MyChart msg. The name of the cardiologist I suggested was Dr. Cristal Deer. I think once she's finished following with me acutely, she would be a good fit for Dr. Cristal Deer [facility] too far for her now) and I will plan to get her set up in follow-up.

## 2023-03-20 NOTE — Telephone Encounter (Signed)
Spoke with patient to review lab results. Explained Cassie Spies, PA-C reviewed and says labs are stable. Cassie [name] would like her to consider trial of torsemide for her lower extremity edema, 20mg  once daily and recheck BMET 1 week after starting. Patient states she would like to look up the medication and think about it, she also asks if she can have the BMET drawn at her lab appointment on 04/13/23.  She states she has had a hard time recently in our lab, and because she uses a wheelchair and does not drive she is dependent on caregivers to get her to all appointments. Patient responses forwarded to [facility].

## 2023-03-21 NOTE — Telephone Encounter (Signed)
Mailbox full

## 2023-03-21 NOTE — Telephone Encounter (Signed)
Mail box full. Unable to leave voice mail.

## 2023-03-22 ENCOUNTER — Other Ambulatory Visit: Payer: Self-pay

## 2023-03-22 DIAGNOSIS — R600 Localized edema: Secondary | ICD-10-CM

## 2023-03-22 MED ORDER — TORSEMIDE 20 MG PO TABS: 20.0000 mg | ORAL_TABLET | Freq: Every day | ORAL | 2 refills | Status: DC

## 2023-03-22 NOTE — Telephone Encounter (Signed)
Spoke with pt on the phone and discussed the previous note from Rexene Edison, RN about starting Torsemide. Pt agreed that she is ready to start that medication. She said because she needs to have a BMP drawn 1 week after starting the medication, she is planning on starting the medication next week so she can have her blood work done at the same appt as her echo on 11/27. Pt aware Torsemide is being sent in to the pharmacy. Pt verbalized understanding of the plan of care and has no questions.

## 2023-03-30 MED ORDER — FUROSEMIDE 20 MG PO TABS: 20.0000 mg | ORAL_TABLET | ORAL | 3 refills | Status: DC

## 2023-03-30 NOTE — Telephone Encounter (Signed)
Sending reply to triage to assist with med change. OK to change torsemide to Lasix 20mg  per patient request - since Lasix is less effective than the equivalent dose of torsemide, would prescribe daily but OK to do every other day if patient requests to do that instead. Would still need BMET in 1 week and needs to hold Lasix the day of coronary CT, thank you

## 2023-03-30 NOTE — Addendum Note (Signed)
Addended by: Bertram Millard on: 03/30/2023 12:48 PM   Modules accepted: Orders

## 2023-03-31 ENCOUNTER — Telehealth: Payer: Self-pay | Admitting: Physician Assistant

## 2023-03-31 NOTE — Telephone Encounter (Signed)
Pt called in asking to speak with nurse about her appts coming up.

## 2023-04-05 ENCOUNTER — Other Ambulatory Visit (HOSPITAL_BASED_OUTPATIENT_CLINIC_OR_DEPARTMENT_OTHER): Payer: 59

## 2023-04-05 DIAGNOSIS — R0609 Other forms of dyspnea: Secondary | ICD-10-CM | POA: Diagnosis not present

## 2023-04-05 LAB — ECHOCARDIOGRAM COMPLETE
Area-P 1/2: 3.77 cm2
Calc EF: 59 %
S' Lateral: 2.21 cm
Single Plane A2C EF: 52.5 %
Single Plane A4C EF: 64.8 %

## 2023-04-07 ENCOUNTER — Ambulatory Visit (HOSPITAL_COMMUNITY): Payer: 59

## 2023-04-09 ENCOUNTER — Other Ambulatory Visit: Payer: Self-pay | Admitting: Internal Medicine

## 2023-04-12 ENCOUNTER — Telehealth: Payer: Self-pay | Admitting: Internal Medicine

## 2023-04-12 ENCOUNTER — Telehealth: Payer: Self-pay | Admitting: Physician Assistant

## 2023-04-12 NOTE — Telephone Encounter (Signed)
Called patient and she would rather reschedule appointment, then reschedule test. Informed patient that if anything comes up on the CT and we need to see her back sooner, then we could get her in to see one of the providers at that time. Right now scheduled patient's follow-up next available in February with Bernette Mayers PA.

## 2023-04-12 NOTE — Telephone Encounter (Signed)
Spoke with patient and she states her CT had to be rescheduled because [facility] could not do it. She was rescheduled until 12/10. Her follow up is 12/11 with Dayna. She would like to know if she needs to be rescheduled being that the results from CT may not be in

## 2023-04-12 NOTE — Telephone Encounter (Signed)
Ideally best to give a few days between CT study being done and follow-up appointment in case it needs to be sent for [facility] analysis which can take a few days. Can r/s appt or see if Mildred can accommodate CT scheduling sooner if they have anything/pt willing. Thank you!

## 2023-04-12 NOTE — Telephone Encounter (Signed)
Patient called to follow-up if she will need to keep the visit on 12/11 or if she should reschedule to after results have been read.

## 2023-04-12 NOTE — Telephone Encounter (Signed)
Patient wants a provider switch from Dr. Okey Dupre in [place] to Dr. Cristal Deer in [place].  Patient lives [place].

## 2023-04-13 ENCOUNTER — Inpatient Hospital Stay: Payer: 59 | Admitting: Medical Oncology

## 2023-04-13 ENCOUNTER — Encounter: Payer: Self-pay | Admitting: Medical Oncology

## 2023-04-13 ENCOUNTER — Encounter: Payer: Self-pay | Admitting: Family

## 2023-04-13 ENCOUNTER — Other Ambulatory Visit: Payer: 59

## 2023-04-13 ENCOUNTER — Ambulatory Visit: Payer: 59 | Admitting: Medical Oncology

## 2023-04-13 ENCOUNTER — Inpatient Hospital Stay: Payer: 59 | Attending: Hematology & Oncology

## 2023-04-13 VITALS — BP 121/54 | HR 54 | Temp 98.8°F | Resp 19 | Ht 65.0 in | Wt 350.0 lb

## 2023-04-13 DIAGNOSIS — Z862 Personal history of diseases of the blood and blood-forming organs and certain disorders involving the immune mechanism: Secondary | ICD-10-CM | POA: Insufficient documentation

## 2023-04-13 DIAGNOSIS — Z7901 Long term (current) use of anticoagulants: Secondary | ICD-10-CM | POA: Insufficient documentation

## 2023-04-13 DIAGNOSIS — Z86711 Personal history of pulmonary embolism: Secondary | ICD-10-CM | POA: Diagnosis present

## 2023-04-13 DIAGNOSIS — I82401 Acute embolism and thrombosis of unspecified deep veins of right lower extremity: Secondary | ICD-10-CM

## 2023-04-13 DIAGNOSIS — D649 Anemia, unspecified: Secondary | ICD-10-CM

## 2023-04-13 DIAGNOSIS — Z8639 Personal history of other endocrine, nutritional and metabolic disease: Secondary | ICD-10-CM

## 2023-04-13 DIAGNOSIS — Z5181 Encounter for therapeutic drug level monitoring: Secondary | ICD-10-CM

## 2023-04-13 LAB — RETICULOCYTES
Immature Retic Fract: 7.2 % (ref 2.3–15.9)
RBC.: 4.32 MIL/uL (ref 3.87–5.11)
Retic Count, Absolute: 55.7 10*3/uL (ref 19.0–186.0)
Retic Ct Pct: 1.3 % (ref 0.4–3.1)

## 2023-04-13 LAB — FERRITIN: Ferritin: 21 ng/mL (ref 11–307)

## 2023-04-13 LAB — CBC WITH DIFFERENTIAL (CANCER CENTER ONLY)
Abs Immature Granulocytes: 0.02 10*3/uL (ref 0.00–0.07)
Basophils Absolute: 0 10*3/uL (ref 0.0–0.1)
Basophils Relative: 0 %
Eosinophils Absolute: 0.2 10*3/uL (ref 0.0–0.5)
Eosinophils Relative: 3 %
HCT: 42.1 % (ref 36.0–46.0)
Hemoglobin: 13.6 g/dL (ref 12.0–15.0)
Immature Granulocytes: 0 %
Lymphocytes Relative: 22 %
Lymphs Abs: 1.5 10*3/uL (ref 0.7–4.0)
MCH: 31 pg (ref 26.0–34.0)
MCHC: 32.3 g/dL (ref 30.0–36.0)
MCV: 95.9 fL (ref 80.0–100.0)
Monocytes Absolute: 0.5 10*3/uL (ref 0.1–1.0)
Monocytes Relative: 7 %
Neutro Abs: 4.5 10*3/uL (ref 1.7–7.7)
Neutrophils Relative %: 68 %
Platelet Count: 222 10*3/uL (ref 150–400)
RBC: 4.39 MIL/uL (ref 3.87–5.11)
RDW: 13.9 % (ref 11.5–15.5)
WBC Count: 6.7 10*3/uL (ref 4.0–10.5)
nRBC: 0 % (ref 0.0–0.2)

## 2023-04-13 LAB — CMP (CANCER CENTER ONLY)
ALT: 11 U/L (ref 0–44)
AST: 12 U/L — ABNORMAL LOW (ref 15–41)
Albumin: 3.5 g/dL (ref 3.5–5.0)
Alkaline Phosphatase: 59 U/L (ref 38–126)
Anion gap: 6 (ref 5–15)
BUN: 8 mg/dL (ref 8–23)
CO2: 30 mmol/L (ref 22–32)
Calcium: 9.1 mg/dL (ref 8.9–10.3)
Chloride: 105 mmol/L (ref 98–111)
Creatinine: 0.83 mg/dL (ref 0.44–1.00)
GFR, Estimated: >60 mL/min (ref >60)
Glucose, Bld: 125 mg/dL — ABNORMAL HIGH (ref 70–99)
Potassium: 4.3 mmol/L (ref 3.5–5.1)
Sodium: 141 mmol/L (ref 135–145)
Total Bilirubin: 0.5 mg/dL (ref <1.2)
Total Protein: 7.3 g/dL (ref 6.5–8.1)

## 2023-04-13 LAB — D-DIMER, QUANTITATIVE: D-Dimer, Quant: <0.27 ug{FEU}/mL (ref 0.00–0.50)

## 2023-04-13 NOTE — Progress Notes (Signed)
Hematology and Oncology Follow Up Visit  Cassie [name] [id number] 18-Apr-1961 62 y.o. 04/13/2023   Principle Diagnosis:  Idiopathic right lower lobe pulmonary embolism Ulcerative colitis   Current Therapy:        ELIQUIS 5 mg by mouth twice a day   Interim History:  Cassie [name] is here today with her husband for follow-up.   Unfortunately she is still having some significant mobility concerns. She is also still having issues with fatigue, palpitations, SOB with exertion and swelling in her lower extremities. She is being followed by various specialists for these.  No fever, chills, n/v, cough, rash, chest pain, abdominal pain or changes in bowel or bladder habits at this time.  No issues with blood loss. No abnormal bruising, no petechiae.  No falls or syncope reported.  She is in a wheelchair today.  Appetite and hydration are good.   ECOG Performance Status: 2 - Symptomatic, <50% confined to bed  Medications:  Allergies as of 04/13/2023       Reactions   Diphenhydramine Hcl Swelling   Throat feels like it swells    Doxycycline Palpitations   Hydrocodone Other (See Comments)   Hallucinations    Sudafed [pseudoephedrine Hcl] Shortness Of Breath   Azithromycin Palpitations   Speeding heart rate per pt.   Chlorhexidine Gluconate Itching   Burning   Ibuprofen Itching   Lactose Intolerance (gi) Diarrhea, Other (See Comments)   bloating   Mesalamine Other (See Comments)   Hair loss   Caffeine Palpitations   Codeine Other (See Comments)   Other Other (See Comments)   Fillers in some medication/ Heart palpitations Gi upset   Penicillins Rash   Has patient had a PCN reaction causing immediate rash, facial/tongue/throat swelling, SOB or lightheadedness with hypotension: Yes Has patient had a PCN reaction causing severe rash involving mucus membranes or skin necrosis: No Has patient had a PCN reaction that required hospitalization Yes Has patient had a PCN reaction  occurring within the last 10 years: No If all of the above answers are "NO", then may proceed with Cephalosporin use.   Prednisone Palpitations        Medication List        Accurate as of April 13, 2023  9:45 AM. If you have any questions, ask your nurse or doctor.          acetaminophen 500 MG tablet Commonly known as: TYLENOL Take 1,000 mg by mouth every 6 (six) hours as needed for moderate pain or headache.   ALPRAZolam 1 MG tablet Commonly known as: XANAX TAKE 1/2 TO 1 TABLET(0.5 TO 1 MG) BY MOUTH TWICE DAILY AS NEEDED FOR ANXIETY   atenolol 50 MG tablet Commonly known as: TENORMIN Take 12.5 mg by mouth 2 (two) times daily.   cholecalciferol 25 MCG (1000 UNIT) tablet Commonly known as: VITAMIN D3 Take 2,000 Units by mouth daily.   cyanocobalamin 1000 MCG tablet Take 1,000 mcg by mouth daily.   Eliquis 5 MG Tabs tablet Generic drug: apixaban TAKE 1 TABLET(5 MG) BY MOUTH TWICE DAILY   furosemide 20 MG tablet Commonly known as: LASIX Take 1 tablet (20 mg total) by mouth every other day.   metoprolol tartrate 100 MG tablet Commonly known as: LOPRESSOR Take 1 tablet (100 mg total) by mouth once for 1 dose. TWO HOURS PRIOR TO TESTING   Mirena (52 MG) 20 MCG/24HR Iud Generic drug: levonorgestrel 1 each by Intrauterine route once.   Na Sulfate-K Sulfate-Mg Sulf 17.5-3.13-1.6 GM/177ML  Soln Take by mouth once.        Allergies:  Allergies  Allergen Reactions   Diphenhydramine Hcl Swelling    Throat feels like it swells    Doxycycline Palpitations   Hydrocodone Other (See Comments)    Hallucinations    Sudafed [Pseudoephedrine Hcl] Shortness Of Breath   Azithromycin Palpitations    Speeding heart rate per pt.    Chlorhexidine Gluconate Itching    Burning   Ibuprofen Itching   Lactose Intolerance (Gi) Diarrhea and Other (See Comments)    bloating   Mesalamine Other (See Comments)    Hair loss   Caffeine Palpitations   Codeine Other (See  Comments)   Other Other (See Comments)    Fillers in some medication/ Heart palpitations Gi upset   Penicillins Rash    Has patient had a PCN reaction causing immediate rash, facial/tongue/throat swelling, SOB or lightheadedness with hypotension: Yes Has patient had a PCN reaction causing severe rash involving mucus membranes or skin necrosis: No Has patient had a PCN reaction that required hospitalization Yes Has patient had a PCN reaction occurring within the last 10 years: No If all of the above answers are "NO", then may proceed with Cephalosporin use.   Prednisone Palpitations    Past Medical History, Surgical history, Social history, and Family History were reviewed and updated.  Review of Systems: All other 10 point review of systems is negative.   Physical Exam:  height is 5\' 5"  (1.651 m) and weight is 350 lb (158.8 kg) (abnormal). Her oral temperature is 98.8 F (37.1 C). Her blood pressure is 121/54 (abnormal) and her pulse is 54 (abnormal). Her respiration is 19 and oxygen saturation is 100%.   Wt Readings from Last 3 Encounters:  04/13/23 (!) 350 lb (158.8 kg)  03/17/23 (!) 350 lb (158.8 kg)  08/16/22 (!) 340 lb (154.2 kg)   Constitutional: Resting in wheelchair Ocular: Sclerae unicteric, pupils equal, round and reactive to light Ear-nose-throat: Oropharynx clear, dentition fair Lymphatic: No cervical or supraclavicular adenopathy Lungs no rales or rhonchi, good excursion bilaterally Heart regular rate and rhythm, no murmur appreciated Abd soft, nontender, positive bowel sounds MSK no focal spinal tenderness, no joint edema Neuro: non-focal, well-oriented, appropriate affect  Lab Results  Component Value Date   WBC 6.7 04/13/2023   HGB 13.6 04/13/2023   HCT 42.1 04/13/2023   MCV 95.9 04/13/2023   PLT 222 04/13/2023   Lab Results  Component Value Date   FERRITIN 11 10/05/2022   IRON 95 10/05/2022   TIBC 330 10/05/2022   UIBC 235 10/05/2022   IRONPCTSAT 29  10/05/2022   Lab Results  Component Value Date   RETICCTPCT 1.3 04/13/2023   RBC 4.32 04/13/2023   No results found for: "KPAFRELGTCHN", "LAMBDASER", "KAPLAMBRATIO" Lab Results  Component Value Date   IGA 341 05/25/2015   No results found for: "TOTALPROTELP", "ALBUMINELP", "A1GS", "A2GS", "BETS", "BETA2SER", "GAMS", "MSPIKE", "SPEI"   Chemistry      Component Value Date/Time   NA 141 04/13/2023 0851   NA 142 03/17/2023 1049   NA 139 01/24/2017 0929   K 4.3 04/13/2023 0851   K 4.0 01/24/2017 0929   CL 105 04/13/2023 0851   CL 103 10/10/2016 1403   CL 105 09/09/2016 1332   CO2 30 04/13/2023 0851   CO2 24 01/24/2017 0929   BUN 8 04/13/2023 0851   BUN 8 03/17/2023 1049   BUN 9.5 01/24/2017 0929   CREATININE 0.83 04/13/2023 0851  CREATININE 0.86 01/31/2020 0835   CREATININE 0.8 01/24/2017 0929      Component Value Date/Time   CALCIUM 9.1 04/13/2023 0851   CALCIUM 9.7 01/24/2017 0929   ALKPHOS 59 04/13/2023 0851   ALKPHOS 107 01/24/2017 0929   AST 12 (L) 04/13/2023 0851   AST 18 01/24/2017 0929   ALT 11 04/13/2023 0851   ALT 16 01/24/2017 0929   BILITOT 0.5 04/13/2023 0851   BILITOT 0.31 01/24/2017 0929     Encounter Diagnoses  Name Primary?   History of pulmonary embolism Yes   Anticoagulation management encounter    History of iron deficiency     Impression and Plan: Cassie [name] is a pleasant 62 yo caucasian female with history of PE on Eliquis 5 mg PO BID.  She also has a history of iron deficiency. Colonoscopy is pending improvement in her cardiac status.   No changes in her regimen with Eliquis, lifelong.  CBC and CMP reviewed and are stable RTC 6 months Montez Morita, labs (CBC, CMP, iron, ferritin)  Rushie Chestnut, PA-C 12/5/20249:45 AM

## 2023-04-17 ENCOUNTER — Telehealth (HOSPITAL_COMMUNITY): Payer: Self-pay | Admitting: *Deleted

## 2023-04-17 NOTE — Telephone Encounter (Signed)
Reaching out to patient to offer assistance regarding upcoming cardiac imaging study; pt verbalizes understanding of appt date/time, parking situation and where to check in, pre-test NPO status and medications ordered, and verified current allergies; name and call back number provided for further questions should they arise Hayley Sharpe RN Navigator Cardiac Imaging [facility] [phone] office [phone] cell  

## 2023-04-18 ENCOUNTER — Ambulatory Visit (HOSPITAL_COMMUNITY)
Admission: RE | Admit: 2023-04-18 | Discharge: 2023-04-18 | Disposition: A | Discharge status: Home / Self Care | Payer: 59 | Source: Ambulatory Visit | Attending: Physician Assistant | Admitting: Physician Assistant

## 2023-04-18 DIAGNOSIS — R0609 Other forms of dyspnea: Secondary | ICD-10-CM | POA: Diagnosis present

## 2023-04-18 DIAGNOSIS — I2510 Atherosclerotic heart disease of native coronary artery without angina pectoris: Secondary | ICD-10-CM | POA: Diagnosis not present

## 2023-04-18 MED ORDER — NITROGLYCERIN 0.4 MG SL SUBL
SUBLINGUAL_TABLET | SUBLINGUAL | Status: AC
  Filled 2023-04-18: qty 2

## 2023-04-18 MED ORDER — NITROGLYCERIN 0.4 MG SL SUBL
0.8000 mg | SUBLINGUAL_TABLET | Freq: Once | SUBLINGUAL | Status: AC
  Administered 2023-04-18: 0.8 mg via SUBLINGUAL

## 2023-04-18 MED ORDER — IOHEXOL 350 MG/ML SOLN
125.0000 mL | Freq: Once | INTRAVENOUS | Status: AC | PRN
  Administered 2023-04-18: 125 mL via INTRAVENOUS

## 2023-04-19 ENCOUNTER — Ambulatory Visit: Payer: 59 | Admitting: Physician Assistant

## 2023-04-20 ENCOUNTER — Ambulatory Visit (HOSPITAL_COMMUNITY): Admission: RE | Admit: 2023-04-20 | Payer: 59 | Source: Ambulatory Visit | Admitting: Internal Medicine

## 2023-04-20 SURGERY — COLONOSCOPY WITH PROPOFOL
Anesthesia: Monitor Anesthesia Care

## 2023-04-20 NOTE — Telephone Encounter (Signed)
Spoke with pt, I tried to answer her questions but I am unable to answer all of her questions. Dayna can you please give this patient a call.

## 2023-05-12 ENCOUNTER — Encounter: Payer: Self-pay | Admitting: Internal Medicine

## 2023-05-12 LAB — HM MAMMOGRAPHY

## 2023-05-23 ENCOUNTER — Encounter: Payer: Self-pay | Admitting: Obstetrics and Gynecology

## 2023-05-24 ENCOUNTER — Other Ambulatory Visit: Payer: Self-pay | Admitting: Obstetrics and Gynecology

## 2023-05-24 DIAGNOSIS — Z8742 Personal history of other diseases of the female genital tract: Secondary | ICD-10-CM

## 2023-05-24 DIAGNOSIS — Z30431 Encounter for routine checking of intrauterine contraceptive device: Secondary | ICD-10-CM

## 2023-05-24 NOTE — Progress Notes (Signed)
 Pelvic US ordered.

## 2023-05-26 ENCOUNTER — Encounter: Payer: Self-pay | Admitting: Internal Medicine

## 2023-05-29 NOTE — Telephone Encounter (Signed)
Per review of EPIC, patient is scheduled for PUS 06/27/23.

## 2023-06-07 ENCOUNTER — Other Ambulatory Visit: Payer: Self-pay | Admitting: Hematology & Oncology

## 2023-06-07 DIAGNOSIS — I82401 Acute embolism and thrombosis of unspecified deep veins of right lower extremity: Secondary | ICD-10-CM

## 2023-06-07 DIAGNOSIS — Z86711 Personal history of pulmonary embolism: Secondary | ICD-10-CM

## 2023-06-12 NOTE — Progress Notes (Deleted)
 Cardiology Office Note    Date:  06/14/2023  ID:  Cassie [name], DOB Mar 13, 1961, MRN [id number] PCP:  Rollene Almarie LABOR, MD  Cardiologist:  Lonni Hanson, MD, eventually transitioning to Dr. Lonni as [place] is too far Electrophysiologist:  None   Chief Complaint: ***  History of Present Illness: .    Cassie HUEBERT is a 63 y.o. female with visit-pertinent history of PSVT, PVCs, chest pain with nonobstructive CAD, chronic edema unresponsive to diuretics felt possibly due to lymphedema, PE on chronic anticoagulation managed by Dr. Timmy, ulcerative colitis, vitamin B12 deficiency, vitamin D  deficiency, anxiety, panic disorder, morbid obesity, mild OSA by prior sleep study (pt states not requiring CPAP), gallstones, hiatal hernia, post-menopausal bleeding with endometrial hyperplasia treated with Mirena  IUD. She is typically in wheelchair.   Remote nuc 2005 was normal. She has a history of prior chest pain. Cor CT 2018 showed CAC 54 (89%ile) with mild nonobstructive CAD in RCA and LAD with myocardial bridge in mLAD. She has worn several monitors over the years demonstrating PACs, PVCs, PSVT, NSVT, AIVR. Last monitor 07/2020 showed rare PACs, PVCs, 19 brief runs SVT (max 8 beats). She has been managed on atenolol . She also has history of LE edema, Dr. Hanson previously recommended f/u PCP for management of lymphedema and the patient states she's gotten nowhere with this. Prior notes indicate this was unresponsive to diuretics; she denies being on chronic diuretic therapy.   At last OV 03/2023 she was noting continued edema, palpitations, DOE and was also seeking preop eval for GYN procedure (hysteroscopy D & C myosure, ECC and Mirena  IUD exchange regional sedation) as well as colonoscopy. She saw GYN who anticipated replacing her previous Mirena  with a new one). Their note stated, She has had a prior consult with GYN oncology.  Discussed after this procedure if she continues to have  postmenopausal bleeding that she will need to do an additional consult with them.  Patient is a poor surgical candidate but at that point may need to explore having the hysterectomy.   Coronary CTA  was repeated 04/2023 showing CAC 163, 88%ile, TPV 74%ile, mild nonobstructive CAD in all vessels. F/u echo 03/2023 showed EF 60-65%, G2DD, mild LAE, normal RV. Zio showed average HR 53 (range 44-94), rare PACs, PVCs, rare SVT (max 8 beats), symptoms correlated to NSR and PACs. We had discussed trial of torsemide . She wished to defer due to prior sensitivity to medications. She did not wish to start rosuvastatin. She declined GLP1 due to her UC/chronic diarrhea and that most healthful foods also cause GI upset.   Is she undergoing testing? GYN, colnoscopy  Mild CAD PSVT, PACs, PVCs, also brief NSVT/AIVR 2021 Chronic edema Preop evaluation     Labwork independently reviewed: 04/2024 K 4.3, Cr 0.83, AST ALT OK, d-dimer wnl, ferritin 21 03/2023 Mg 2.1, BNP wnl, CBC wnl, TSH 4.4 09/2022 K 4.5, Cr 0.90, LFTs ok, AST low, ALT OK 07/2022 TSH wnl, Mg wnl 2023 LDL 96 -> PCP follows   ROS: .    Please see the history of present illness. Otherwise, review of systems is positive for ***.  All other systems are reviewed and otherwise negative.  Studies Reviewed: SABRA    EKG:  EKG is ordered today, personally reviewed, demonstrating ***  CV Studies: Cardiac studies reviewed are outlined and summarized above. Otherwise please see EMR for full report.   Current Reported Medications:.    No outpatient medications have been marked as taking for the  06/14/23 encounter (Appointment) with Mayo Owczarzak N, PA-C.   Current Facility-Administered Medications for the 06/14/23 encounter (Appointment) with Lillyen Schow N, PA-C  Medication   levonorgestrel  (MIRENA ) 20 MCG/DAY IUD 1 each    Physical Exam:    VS:  LMP 05/09/2010 (Approximate)    Wt Readings from Last 3 Encounters:  04/13/23 (!) 350 lb (158.8 kg)   03/17/23 (!) 350 lb (158.8 kg)  08/16/22 (!) 340 lb (154.2 kg)    GEN: Well nourished, well developed in no acute distress NECK: No JVD; No carotid bruits CARDIAC: ***RRR, no murmurs, rubs, gallops RESPIRATORY:  Clear to auscultation without rales, wheezing or rhonchi  ABDOMEN: Soft, non-tender, non-distended EXTREMITIES:  No edema; No acute deformity   Asessement and Plan:.     ***     Disposition: F/u with ***  Signed, Cassie Mom N Nemesis Rainwater, PA-C

## 2023-06-13 NOTE — Progress Notes (Deleted)
 GYNECOLOGY  VISIT   HPI: 63 y.o.   Married  Caucasian female   G0P0000 with Patient's last menstrual period was 05/09/2010 (approximate).   here for: U/S consult     GYNECOLOGIC HISTORY: Patient's last menstrual period was 05/09/2010 (approximate). Contraception:  PMP/IUD--Mirena 02/01/23 Menopausal hormone therapy:  n/a Last 2 paps:  06/20/19 neg: HR HPV neg, 09/02/04 neg History of abnormal Pap or positive HPV:  {YES NO:22349} Mammogram:  05/12/23 Breast Density cat A, BI-RADS CAT 1 neg        OB History     Gravida  0   Para  0   Term  0   Preterm  0   AB  0   Living  0      SAB  0   IAB  0   Ectopic  0   Multiple  0   Live Births  0              Patient Active Problem List   Diagnosis Date Noted   Chronic pancolonic ulcerative colitis (HCC)    Benign neoplasm of cecum    Benign neoplasm of transverse colon    Benign neoplasm of sigmoid colon    Benign neoplasm of rectosigmoid junction    History of colonic polyps 09/01/2020   Hyperlipidemia 02/27/2020   Head pain 10/25/2019   Elevated LFTs 10/25/2019   NSVT (nonsustained ventricular tachycardia) (HCC) 07/10/2019   Simple endometrial hyperplasia 06/23/2019   Postmenopausal bleeding 04/03/2019   Cough variant asthma vs UACS from gerd 01/01/2018   PVC (premature ventricular contraction) 08/17/2017   PSVT (paroxysmal supraventricular tachycardia) (HCC) 08/17/2017   Leg edema 05/19/2017   Other fatigue 12/10/2016   Myocardial bridge 12/10/2016   Coronary artery calcification 12/10/2016   History of pulmonary embolism 12/10/2016   GERD (gastroesophageal reflux disease) 07/12/2016   Dyspnea on exertion 05/31/2016   Chronic venous insufficiency 05/31/2016   Routine general medical examination at a health care facility 06/27/2014   Left medial knee pain 07/17/2013   Adrenal incidentaloma (HCC) 03/25/2013   Allergic rhinitis 09/13/2011   B12 deficiency 11/28/2008   Vitamin D deficiency 06/30/2008    Palpitation 06/30/2008   Morbid obesity (HCC) 10/15/2007   Anxiety about health 08/23/2007   Cardiac dysrhythmia 08/23/2007    Past Medical History:  Diagnosis Date   Adenomatous colon polyp    Adrenal nodule (HCC)    AIVR (accelerated idioventricular rhythm) (HCC)    Allergy    Anemia    in her early twenties   Anxiety    Arthritis    knees,thumbs   Cataract    bilateral removed    Complication of anesthesia    BP dropped with a colonoscopy in [place]. no problems since.   Cyst of kidney, acquired    cyst right kidney- benign   Degenerative disc disease, lumbar    Dyspnea    Dysrhythmia    palpitations   Edema    Gallstones    History of anemia    20 yrs. ago not now- pt denies    History of hiatal hernia    IUD (intrauterine device) in place    Mild CAD    Morbid obesity (HCC)    Myocardial bridge    NSVT (nonsustained ventricular tachycardia) (HCC)    Osteoarthritis    Other allergy, other than to medicinal agents    Panic disorder    Premature atrial contractions    PSVT (paroxysmal supraventricular tachycardia) (HCC)  Pulmonary embolism (HCC)    PVC's (premature ventricular contractions)    Run of atrial premature complexes    Ulcerative colitis, unspecified    Vitamin B12 deficiency    Vitamin D deficiency    Wheelchair bound     Past Surgical History:  Procedure Laterality Date   BIOPSY  11/17/2020   Procedure: BIOPSY;  Surgeon: Beverley Fiedler, MD;  Location: [facility];  Service: Gastroenterology;;   BREAST BIOPSY  11/2011   Left- benign   CATARACT EXTRACTION Right    CATARACT EXTRACTION Left    CHOLECYSTECTOMY N/A 01/17/2014   Procedure: LAPAROSCOPIC CHOLECYSTECTOMY;  Surgeon: Glenna Fellows, MD;  Location: [facility];  Service: General;  Laterality: N/A;   COLONOSCOPY     COLONOSCOPY WITH PROPOFOL N/A 11/17/2020   Procedure: COLONOSCOPY WITH PROPOFOL;  Surgeon: Beverley Fiedler, MD;  Location: [facility];  Service: Gastroenterology;  Laterality:  N/A;   DILATATION & CURETTAGE/HYSTEROSCOPY WITH MYOSURE N/A 12/23/2019   Procedure: DILATATION & CURETTAGE, HYSTEROSCOPY WITH MYOSURE, RESECTION OF POLYPOID TISSUE;  Surgeon: Jerene Bears, MD;  Location: [facility] OR;  Service: Gynecology;  Laterality: N/A;   HYSTEROSCOPY WITH D & C N/A 07/27/2020   Procedure: DILATATION AND CURETTAGE /HYSTEROSCOPY;  Surgeon: Patton Salles, MD;  Location: [facility] OR;  Service: Gynecology;  Laterality: N/A;   INTRAUTERINE DEVICE (IUD) INSERTION N/A 07/27/2020   Procedure: INTRAUTERINE DEVICE (IUD) INSERTION MIRENA;  Surgeon: Patton Salles, MD;  Location: [facility] OR;  Service: Gynecology;  Laterality: N/A;   POLYPECTOMY  11/17/2020   Procedure: POLYPECTOMY;  Surgeon: Beverley Fiedler, MD;  Location: [facility];  Service: Gastroenterology;;   RETINAL LASER PROCEDURE Left    SIGMOIDOSCOPY     SUBMUCOSAL TATTOO INJECTION  11/17/2020   Procedure: SUBMUCOSAL TATTOO INJECTION;  Surgeon: Beverley Fiedler, MD;  Location: [facility];  Service: Gastroenterology;;   TOOTH EXTRACTION      Current Outpatient Medications  Medication Sig Dispense Refill   acetaminophen (TYLENOL) 500 MG tablet Take 1,000 mg by mouth every 6 (six) hours as needed for moderate pain or headache.     ALPRAZolam (XANAX) 1 MG tablet TAKE 1/2 TO 1 TABLET(0.5 TO 1 MG) BY MOUTH TWICE DAILY AS NEEDED FOR ANXIETY 60 tablet 5   atenolol (TENORMIN) 50 MG tablet Take 12.5 mg by mouth 2 (two) times daily.     cholecalciferol (VITAMIN D3) 25 MCG (1000 UNIT) tablet Take 2,000 Units by mouth daily.     cyanocobalamin 1000 MCG tablet Take 1,000 mcg by mouth daily.     ELIQUIS 5 MG TABS tablet TAKE 1 TABLET(5 MG) BY MOUTH TWICE DAILY 60 tablet 6   furosemide (LASIX) 20 MG tablet Take 1 tablet (20 mg total) by mouth every other day. 90 tablet 3   levonorgestrel (MIRENA, 52 MG,) 20 MCG/24HR IUD 1 each by Intrauterine route once.     metoprolol tartrate (LOPRESSOR) 100 MG tablet Take 1 tablet (100 mg total)  by mouth once for 1 dose. TWO HOURS PRIOR TO TESTING 1 tablet 0   Na Sulfate-K Sulfate-Mg Sulf 17.5-3.13-1.6 GM/177ML SOLN Take by mouth once. (Patient not taking: Reported on 03/17/2023)     Current Facility-Administered Medications  Medication Dose Route Frequency Provider Last Rate Last Admin   levonorgestrel (MIRENA) 20 MCG/DAY IUD 1 each  1 each Intrauterine Once          ALLERGIES: Benadryl [diphenhydramine], Diphenhydramine hcl, Doxycycline, Hydrocodone, Sudafed [pseudoephedrine hcl], Azithromycin, Chlorhexidine gluconate, Ibuprofen, Lactose intolerance (  gi), Mesalamine, Caffeine, Codeine, Other, Penicillins, and Prednisone  Family History  Problem Relation Age of Onset   Heart disease Father 41       pacemaker   Atrial fibrillation Father    Prostate cancer Father    Hypertension Maternal Grandfather    Hypertension Maternal Aunt    Endometrial cancer Maternal Aunt    Cancer Maternal Aunt    Colon cancer Neg Hx    Stomach cancer Neg Hx    Colon polyps Neg Hx    Esophageal cancer Neg Hx    Rectal cancer Neg Hx    Ovarian cancer Neg Hx    Pancreatic cancer Neg Hx    Breast cancer Neg Hx     Social History   Socioeconomic History   Marital status: Married    Spouse name: Not on file   Number of children: 0   Years of education: Not on file   Highest education level: Not on file  Occupational History   Occupation: Engineer, site: UNEMPLOYED  Tobacco Use   Smoking status: Former    Current packs/day: 0.00    Average packs/day: 2.0 packs/day for 5.0 years (10.0 ttl pk-yrs)    Types: Cigarettes    Start date: 03/06/1974    Quit date: 03/07/1979    Years since quitting: 44.2   Smokeless tobacco: Never  Vaping Use   Vaping status: Never Used  Substance and Sexual Activity   Alcohol use: No    Alcohol/week: 0.0 standard drinks of alcohol   Drug use: No   Sexual activity: Not Currently    Partners: Male    Birth control/protection: Post-menopausal  Other  Topics Concern   Not on file  Social History Narrative   Married: 0 kids   Regular exercise: walks 2 times a week   Caffeine use: none   Social Drivers of Corporate investment banker Strain: Not on file  Food Insecurity: Not on file  Transportation Needs: Not on file  Physical Activity: Not on file  Stress: Not on file  Social Connections: Not on file  Intimate Partner Violence: Not on file    Review of Systems  PHYSICAL EXAMINATION:   LMP 05/09/2010 (Approximate)     General appearance: alert, cooperative and appears stated age Head: Normocephalic, without obvious abnormality, atraumatic Neck: no adenopathy, supple, symmetrical, trachea midline and thyroid normal to inspection and palpation Lungs: clear to auscultation bilaterally Breasts: normal appearance, no masses or tenderness, No nipple retraction or dimpling, No nipple discharge or bleeding, No axillary or supraclavicular adenopathy Heart: regular rate and rhythm Abdomen: soft, non-tender, no masses,  no organomegaly Extremities: extremities normal, atraumatic, no cyanosis or edema Skin: Skin color, texture, turgor normal. No rashes or lesions Lymph nodes: Cervical, supraclavicular, and axillary nodes normal. No abnormal inguinal nodes palpated Neurologic: Grossly normal  Pelvic: External genitalia:  no lesions              Urethra:  normal appearing urethra with no masses, tenderness or lesions              Bartholins and Skenes: normal                 Vagina: normal appearing vagina with normal color and discharge, no lesions              Cervix: no lesions                Bimanual Exam:  Uterus:  normal  size, contour, position, consistency, mobility, non-tender              Adnexa: no mass, fullness, tenderness              Rectal exam: {yes no:314532}.  Confirms.              Anus:  normal sphincter tone, no lesions  Chaperone was present for exam:  {BSCHAPERONE:31226::"Wilburt Messina F,  CMA"}  ASSESSMENT:    PLAN:    {LABS (Optional):23779}  ***  total time was spent for this patient encounter, including preparation, face-to-face counseling with the patient, coordination of care, and documentation of the encounter.

## 2023-06-14 ENCOUNTER — Telehealth: Payer: Self-pay | Admitting: Physician Assistant

## 2023-06-14 ENCOUNTER — Ambulatory Visit: Payer: 59 | Admitting: Physician Assistant

## 2023-06-14 DIAGNOSIS — I2510 Atherosclerotic heart disease of native coronary artery without angina pectoris: Secondary | ICD-10-CM

## 2023-06-14 DIAGNOSIS — I493 Ventricular premature depolarization: Secondary | ICD-10-CM

## 2023-06-14 DIAGNOSIS — I471 Supraventricular tachycardia, unspecified: Secondary | ICD-10-CM

## 2023-06-14 DIAGNOSIS — R609 Edema, unspecified: Secondary | ICD-10-CM

## 2023-06-14 DIAGNOSIS — I491 Atrial premature depolarization: Secondary | ICD-10-CM

## 2023-06-14 DIAGNOSIS — Z01810 Encounter for preprocedural cardiovascular examination: Secondary | ICD-10-CM

## 2023-06-14 NOTE — Telephone Encounter (Signed)
 Spoke with patient and she will follow up with Dr. Veryl Gottron. She denies any acute concerns today.

## 2023-06-14 NOTE — Telephone Encounter (Signed)
 Patient states she is already is aware of all her test results. She canceled appointment for this morning. She would like to know do she really need follow up appointment or can she wait until she see Dr. Veryl Gottron

## 2023-06-14 NOTE — Telephone Encounter (Signed)
 Pt had to cancel appt for this morning. She would like a c/b regarding whether a virtual appt is optional. Please advise

## 2023-06-14 NOTE — Telephone Encounter (Signed)
 OK to establish with Dr. Veryl Gottron in April if she has no acute concerns otherwise. Thank you.

## 2023-06-27 ENCOUNTER — Other Ambulatory Visit: Payer: 59 | Admitting: Obstetrics and Gynecology

## 2023-06-27 ENCOUNTER — Other Ambulatory Visit: Payer: 59

## 2023-06-27 NOTE — Progress Notes (Deleted)
 GYNECOLOGY  VISIT   HPI: 63 y.o.   Married  Caucasian female   G0P0000 with Patient's last menstrual period was 05/09/2010 (approximate).   here for: U/S consult     GYNECOLOGIC HISTORY: Patient's last menstrual period was 05/09/2010 (approximate). Contraception:  PMP/IUD--Mirena 07/27/20 Menopausal hormone therapy:  n/a Last 2 paps:  06/20/19 neg: HR HPV neg History of abnormal Pap or positive HPV:  no Mammogram: 05/12/23 Breast Density Category A, BI-RADS CATEGORY 1 neg         OB History     Gravida  0   Para  0   Term  0   Preterm  0   AB  0   Living  0      SAB  0   IAB  0   Ectopic  0   Multiple  0   Live Births  0              Patient Active Problem List   Diagnosis Date Noted   Chronic pancolonic ulcerative colitis (HCC)    Benign neoplasm of cecum    Benign neoplasm of transverse colon    Benign neoplasm of sigmoid colon    Benign neoplasm of rectosigmoid junction    History of colonic polyps 09/01/2020   Hyperlipidemia 02/27/2020   Head pain 10/25/2019   Elevated LFTs 10/25/2019   NSVT (nonsustained ventricular tachycardia) (HCC) 07/10/2019   Simple endometrial hyperplasia 06/23/2019   Postmenopausal bleeding 04/03/2019   Cough variant asthma vs UACS from gerd 01/01/2018   PVC (premature ventricular contraction) 08/17/2017   PSVT (paroxysmal supraventricular tachycardia) (HCC) 08/17/2017   Leg edema 05/19/2017   Other fatigue 12/10/2016   Myocardial bridge 12/10/2016   Coronary artery calcification 12/10/2016   History of pulmonary embolism 12/10/2016   GERD (gastroesophageal reflux disease) 07/12/2016   Dyspnea on exertion 05/31/2016   Chronic venous insufficiency 05/31/2016   Routine general medical examination at a health care facility 06/27/2014   Left medial knee pain 07/17/2013   Adrenal incidentaloma (HCC) 03/25/2013   Allergic rhinitis 09/13/2011   B12 deficiency 11/28/2008   Vitamin D deficiency 06/30/2008   Palpitation  06/30/2008   Morbid obesity (HCC) 10/15/2007   Anxiety about health 08/23/2007   Cardiac dysrhythmia 08/23/2007    Past Medical History:  Diagnosis Date   Adenomatous colon polyp    Adrenal nodule (HCC)    AIVR (accelerated idioventricular rhythm) (HCC)    Allergy    Anemia    in her early twenties   Anxiety    Arthritis    knees,thumbs   Cataract    bilateral removed    Complication of anesthesia    BP dropped with a colonoscopy in [place]. no problems since.   Cyst of kidney, acquired    cyst right kidney- benign   Degenerative disc disease, lumbar    Dyspnea    Dysrhythmia    palpitations   Edema    Gallstones    History of anemia    20 yrs. ago not now- pt denies    History of hiatal hernia    IUD (intrauterine device) in place    Mild CAD    Morbid obesity (HCC)    Myocardial bridge    NSVT (nonsustained ventricular tachycardia) (HCC)    Osteoarthritis    Other allergy, other than to medicinal agents    Panic disorder    Premature atrial contractions    PSVT (paroxysmal supraventricular tachycardia) (HCC)    Pulmonary  embolism (HCC)    PVC's (premature ventricular contractions)    Run of atrial premature complexes    Ulcerative colitis, unspecified    Vitamin B12 deficiency    Vitamin D deficiency    Wheelchair bound     Past Surgical History:  Procedure Laterality Date   BIOPSY  11/17/2020   Procedure: BIOPSY;  Surgeon: Beverley Fiedler, MD;  Location: [facility];  Service: Gastroenterology;;   BREAST BIOPSY  11/2011   Left- benign   CATARACT EXTRACTION Right    CATARACT EXTRACTION Left    CHOLECYSTECTOMY N/A 01/17/2014   Procedure: LAPAROSCOPIC CHOLECYSTECTOMY;  Surgeon: Glenna Fellows, MD;  Location: [facility];  Service: General;  Laterality: N/A;   COLONOSCOPY     COLONOSCOPY WITH PROPOFOL N/A 11/17/2020   Procedure: COLONOSCOPY WITH PROPOFOL;  Surgeon: Beverley Fiedler, MD;  Location: [facility];  Service: Gastroenterology;  Laterality: N/A;    DILATATION & CURETTAGE/HYSTEROSCOPY WITH MYOSURE N/A 12/23/2019   Procedure: DILATATION & CURETTAGE, HYSTEROSCOPY WITH MYOSURE, RESECTION OF POLYPOID TISSUE;  Surgeon: Jerene Bears, MD;  Location: [facility];  Service: Gynecology;  Laterality: N/A;   HYSTEROSCOPY WITH D & C N/A 07/27/2020   Procedure: DILATATION AND CURETTAGE /HYSTEROSCOPY;  Surgeon: Patton Salles, MD;  Location: [facility] OR;  Service: Gynecology;  Laterality: N/A;   INTRAUTERINE DEVICE (IUD) INSERTION N/A 07/27/2020   Procedure: INTRAUTERINE DEVICE (IUD) INSERTION MIRENA;  Surgeon: Patton Salles, MD;  Location: [facility] OR;  Service: Gynecology;  Laterality: N/A;   POLYPECTOMY  11/17/2020   Procedure: POLYPECTOMY;  Surgeon: Beverley Fiedler, MD;  Location: [facility];  Service: Gastroenterology;;   RETINAL LASER PROCEDURE Left    SIGMOIDOSCOPY     SUBMUCOSAL TATTOO INJECTION  11/17/2020   Procedure: SUBMUCOSAL TATTOO INJECTION;  Surgeon: Beverley Fiedler, MD;  [facility]: [facility];  Service: Gastroenterology;;   TOOTH EXTRACTION      Current Outpatient Medications  Medication Sig Dispense Refill   acetaminophen (TYLENOL) 500 MG tablet Take 1,000 mg by mouth every 6 (six) hours as needed for moderate pain or headache.     ALPRAZolam (XANAX) 1 MG tablet TAKE 1/2 TO 1 TABLET(0.5 TO 1 MG) BY MOUTH TWICE DAILY AS NEEDED FOR ANXIETY 60 tablet 5   atenolol (TENORMIN) 50 MG tablet Take 12.5 mg by mouth 2 (two) times daily.     cholecalciferol (VITAMIN D3) 25 MCG (1000 UNIT) tablet Take 2,000 Units by mouth daily.     cyanocobalamin 1000 MCG tablet Take 1,000 mcg by mouth daily.     ELIQUIS 5 MG TABS tablet TAKE 1 TABLET(5 MG) BY MOUTH TWICE DAILY 60 tablet 6   furosemide (LASIX) 20 MG tablet Take 1 tablet (20 mg total) by mouth every other day. 90 tablet 3   levonorgestrel (MIRENA, 52 MG,) 20 MCG/24HR IUD 1 each by Intrauterine route once.     metoprolol tartrate (LOPRESSOR) 100 MG tablet Take 1 tablet (100 mg total) by  mouth once for 1 dose. TWO HOURS PRIOR TO TESTING 1 tablet 0   Na Sulfate-K Sulfate-Mg Sulf 17.5-3.13-1.6 GM/177ML SOLN Take by mouth once. (Patient not taking: Reported on 03/17/2023)     Current Facility-Administered Medications  Medication Dose Route Frequency Provider Last Rate Last Admin   levonorgestrel (MIRENA) 20 MCG/DAY IUD 1 each  1 each Intrauterine Once          ALLERGIES: Benadryl [diphenhydramine], Diphenhydramine hcl, Doxycycline, Hydrocodone, Sudafed [pseudoephedrine hcl], Azithromycin, Chlorhexidine gluconate, Ibuprofen, Lactose intolerance (gi),  Mesalamine, Caffeine, Codeine, Other, Penicillins, and Prednisone  Family History  Problem Relation Age of Onset   Heart disease Father 60       pacemaker   Atrial fibrillation Father    Prostate cancer Father    Hypertension Maternal Grandfather    Hypertension Maternal Aunt    Endometrial cancer Maternal Aunt    Cancer Maternal Aunt    Colon cancer Neg Hx    Stomach cancer Neg Hx    Colon polyps Neg Hx    Esophageal cancer Neg Hx    Rectal cancer Neg Hx    Ovarian cancer Neg Hx    Pancreatic cancer Neg Hx    Breast cancer Neg Hx     Social History   Socioeconomic History   Marital status: Married    Spouse name: Not on file   Number of children: 0   Years of education: Not on file   Highest education level: Not on file  Occupational History   Occupation: Engineer, site: UNEMPLOYED  Tobacco Use   Smoking status: Former    Current packs/day: 0.00    Average packs/day: 2.0 packs/day for 5.0 years (10.0 ttl pk-yrs)    Types: Cigarettes    Start date: 03/06/1974    Quit date: 03/07/1979    Years since quitting: 44.3   Smokeless tobacco: Never  Vaping Use   Vaping status: Never Used  Substance and Sexual Activity   Alcohol use: No    Alcohol/week: 0.0 standard drinks of alcohol   Drug use: No   Sexual activity: Not Currently    Partners: Male    Birth control/protection: Post-menopausal  Other  Topics Concern   Not on file  Social History Narrative   Married: 0 kids   Regular exercise: walks 2 times a week   Caffeine use: none   Social Drivers of Corporate investment banker Strain: Not on file  Food Insecurity: Not on file  Transportation Needs: Not on file  Physical Activity: Not on file  Stress: Not on file  Social Connections: Not on file  Intimate Partner Violence: Not on file    Review of Systems  PHYSICAL EXAMINATION:   LMP 05/09/2010 (Approximate)     General appearance: alert, cooperative and appears stated age Head: Normocephalic, without obvious abnormality, atraumatic Neck: no adenopathy, supple, symmetrical, trachea midline and thyroid normal to inspection and palpation Lungs: clear to auscultation bilaterally Breasts: normal appearance, no masses or tenderness, No nipple retraction or dimpling, No nipple discharge or bleeding, No axillary or supraclavicular adenopathy Heart: regular rate and rhythm Abdomen: soft, non-tender, no masses,  no organomegaly Extremities: extremities normal, atraumatic, no cyanosis or edema Skin: Skin color, texture, turgor normal. No rashes or lesions Lymph nodes: Cervical, supraclavicular, and axillary nodes normal. No abnormal inguinal nodes palpated Neurologic: Grossly normal  Pelvic: External genitalia:  no lesions              Urethra:  normal appearing urethra with no masses, tenderness or lesions              Bartholins and Skenes: normal                 Vagina: normal appearing vagina with normal color and discharge, no lesions              Cervix: no lesions                Bimanual Exam:  Uterus:  normal size,  contour, position, consistency, mobility, non-tender              Adnexa: no mass, fullness, tenderness              Rectal exam: {yes no:314532}.  Confirms.              Anus:  normal sphincter tone, no lesions  Chaperone was present for exam:  {BSCHAPERONE:31226::"Dare Sanger F,  CMA"}  ASSESSMENT:    PLAN:    {LABS (Optional):23779}  ***  total time was spent for this patient encounter, including preparation, face-to-face counseling with the patient, coordination of care, and documentation of the encounter.

## 2023-07-07 ENCOUNTER — Telehealth: Payer: 59 | Admitting: Physician Assistant

## 2023-07-07 ENCOUNTER — Telehealth: Payer: 59

## 2023-07-07 DIAGNOSIS — H109 Unspecified conjunctivitis: Secondary | ICD-10-CM | POA: Diagnosis not present

## 2023-07-07 MED ORDER — OFLOXACIN 0.3 % OP SOLN: 1.0000 [drp] | Freq: Four times a day (QID) | OPHTHALMIC | 0 refills | Status: DC

## 2023-07-07 NOTE — Progress Notes (Signed)

## 2023-07-11 ENCOUNTER — Other Ambulatory Visit: Payer: 59

## 2023-07-11 ENCOUNTER — Other Ambulatory Visit: Payer: 59 | Admitting: Obstetrics and Gynecology

## 2023-08-01 ENCOUNTER — Telehealth: Payer: Self-pay | Admitting: Internal Medicine

## 2023-08-01 NOTE — Telephone Encounter (Signed)
 Copied from CRM [id number]. Topic: General - Other >> Aug 01, 2023  1:28 PM Almira Coaster wrote: Reason for CRM: Patient is calling to get a letter to excuse her from jury duty due to her disability. Heart arrhythmia and mobility issues due to swelling of her legs and feet. Patient would like to have the letter emailed so she can send it back to them.

## 2023-08-01 NOTE — Progress Notes (Unsigned)
 GYNECOLOGY  VISIT   HPI: 63 y.o.   Married  Caucasian female   G0P0000 with Patient's last menstrual period was 05/09/2010 (approximate).   here for: U/S consult.   She is followed for hx of simple endometrial hyperplasia dx in 2020 at [facility].   Patient has hx brief postmenopausal bleeding and has Mirena IUD suspected to be low in the endometrial canal on prior pelvic ultrasound 01/05/23.     No current vaginal bleeding.  Nothing pink, red, or brown.  Has a feeling like she is having a period.   No cramping.   No change in her baseline discomfort.    Not able to lie on her back for a prolonged period of time.   She wishes she could have a hysterectomy.   Has limitations of ambulation.   Using a wheel chair.   GYNECOLOGIC HISTORY: Patient's last menstrual period was 05/09/2010 (approximate). Contraception:  PMP/IUD--Mirena 07/27/20 Menopausal hormone therapy:  n/a Last 2 paps:  06/20/19 neg: HR HPV neg History of abnormal Pap or positive HPV:  no Mammogram: 05/12/23 Breast Density Category A, BI-RADS CATEGORY 1 neg         OB History     Gravida  0   Para  0   Term  0   Preterm  0   AB  0   Living  0      SAB  0   IAB  0   Ectopic  0   Multiple  0   Live Births  0              Patient Active Problem List   Diagnosis Date Noted   Chronic pancolonic ulcerative colitis (HCC)    Benign neoplasm of cecum    Benign neoplasm of transverse colon    Benign neoplasm of sigmoid colon    Benign neoplasm of rectosigmoid junction    History of colonic polyps 09/01/2020   Hyperlipidemia 02/27/2020   Head pain 10/25/2019   Elevated LFTs 10/25/2019   NSVT (nonsustained ventricular tachycardia) (HCC) 07/10/2019   Simple endometrial hyperplasia 06/23/2019   Postmenopausal bleeding 04/03/2019   Cough variant asthma vs UACS from gerd 01/01/2018   PVC (premature ventricular contraction) 08/17/2017   PSVT (paroxysmal supraventricular tachycardia) (HCC)  08/17/2017   Leg edema 05/19/2017   Other fatigue 12/10/2016   Myocardial bridge 12/10/2016   Coronary artery calcification 12/10/2016   History of pulmonary embolism 12/10/2016   GERD (gastroesophageal reflux disease) 07/12/2016   Dyspnea on exertion 05/31/2016   Chronic venous insufficiency 05/31/2016   Routine general medical examination at a health care facility 06/27/2014   Left medial knee pain 07/17/2013   Adrenal incidentaloma (HCC) 03/25/2013   Allergic rhinitis 09/13/2011   B12 deficiency 11/28/2008   Vitamin D deficiency 06/30/2008   Palpitation 06/30/2008   Morbid obesity (HCC) 10/15/2007   Anxiety about health 08/23/2007   Cardiac dysrhythmia 08/23/2007    Past Medical History:  Diagnosis Date   Adenomatous colon polyp    Adrenal nodule (HCC)    AIVR (accelerated idioventricular rhythm) (HCC)    Allergy    Anemia    in her early twenties   Anxiety    Arthritis    knees,thumbs   Cataract    bilateral removed    Complication of anesthesia    BP dropped with a colonoscopy in [place]. no problems since.   Cyst of kidney, acquired    cyst right kidney- benign   Degenerative disc  disease, lumbar    Dyspnea    Dysrhythmia    palpitations   Edema    Gallstones    History of anemia    20 yrs. ago not now- pt denies    History of hiatal hernia    IUD (intrauterine device) in place    Mild CAD    Morbid obesity (HCC)    Myocardial bridge    NSVT (nonsustained ventricular tachycardia) (HCC)    Osteoarthritis    Other allergy, other than to medicinal agents    Panic disorder    Premature atrial contractions    PSVT (paroxysmal supraventricular tachycardia) (HCC)    Pulmonary embolism (HCC)    PVC's (premature ventricular contractions)    Run of atrial premature complexes    Ulcerative colitis, unspecified    Vitamin B12 deficiency    Vitamin D deficiency    Wheelchair bound     Past Surgical History:  Procedure Laterality Date   BIOPSY  11/17/2020    Procedure: BIOPSY;  Surgeon: Beverley Fiedler, MD;  [facility]: [facility];  Service: Gastroenterology;;   BREAST BIOPSY  11/2011   Left- benign   CATARACT EXTRACTION Right    CATARACT EXTRACTION Left    CHOLECYSTECTOMY N/A 01/17/2014   Procedure: LAPAROSCOPIC CHOLECYSTECTOMY;  Surgeon: Glenna Fellows, MD;  Location: [facility];  Service: General;  Laterality: N/A;   COLONOSCOPY     COLONOSCOPY WITH PROPOFOL N/A 11/17/2020   Procedure: COLONOSCOPY WITH PROPOFOL;  Surgeon: Beverley Fiedler, MD;  Location: [facility];  Service: Gastroenterology;  Laterality: N/A;   DILATATION & CURETTAGE/HYSTEROSCOPY WITH MYOSURE N/A 12/23/2019   Procedure: DILATATION & CURETTAGE, HYSTEROSCOPY WITH MYOSURE, RESECTION OF POLYPOID TISSUE;  Surgeon: Jerene Bears, MD;  Location: [facility];  Service: Gynecology;  Laterality: N/A;   HYSTEROSCOPY WITH D & C N/A 07/27/2020   Procedure: DILATATION AND CURETTAGE /HYSTEROSCOPY;  Surgeon: Patton Salles, MD;  Location: [facility] OR;  Service: Gynecology;  Laterality: N/A;   INTRAUTERINE DEVICE (IUD) INSERTION N/A 07/27/2020   Procedure: INTRAUTERINE DEVICE (IUD) INSERTION MIRENA;  Surgeon: Patton Salles, MD;  Location[facility];  Service: Gynecology;  Laterality: N/A;   POLYPECTOMY  11/17/2020   Procedure: POLYPECTOMY;  Surgeon: Beverley Fiedler, MD;  Location: [facility];  Service: Gastroenterology;;   RETINAL LASER PROCEDURE Left    SIGMOIDOSCOPY     SUBMUCOSAL TATTOO INJECTION  11/17/2020   Procedure: SUBMUCOSAL TATTOO INJECTION;  Surgeon: Beverley Fiedler, MD;  [facility]: [facility];  Service: Gastroenterology;;   TOOTH EXTRACTION      Current Outpatient Medications  Medication Sig Dispense Refill   acetaminophen (TYLENOL) 500 MG tablet Take 1,000 mg by mouth every 6 (six) hours as needed for moderate pain or headache.     ALPRAZolam (XANAX) 1 MG tablet TAKE 1/2 TO 1 TABLET(0.5 TO 1 MG) BY MOUTH TWICE DAILY AS NEEDED FOR ANXIETY 60 tablet 5   atenolol  (TENORMIN) 50 MG tablet TAKE 1/2 TABLET BY MOUTH TWICE DAILY 90 tablet 1   cholecalciferol (VITAMIN D3) 25 MCG (1000 UNIT) tablet Take 2,000 Units by mouth daily.     cyanocobalamin 1000 MCG tablet Take 1,000 mcg by mouth daily.     ELIQUIS 5 MG TABS tablet TAKE 1 TABLET(5 MG) BY MOUTH TWICE DAILY 60 tablet 6   levonorgestrel (MIRENA, 52 MG,) 20 MCG/24HR IUD 1 each by Intrauterine route once.     No current facility-administered medications for this visit.  ALLERGIES: Benadryl [diphenhydramine], Diphenhydramine hcl, Doxycycline, Hydrocodone, Sudafed [pseudoephedrine hcl], Azithromycin, Chlorhexidine gluconate, Ibuprofen, Lactose intolerance (gi), Mesalamine, Caffeine, Codeine, Other, Penicillins, and Prednisone  Family History  Problem Relation Age of Onset   Heart disease Father 39       pacemaker   Atrial fibrillation Father    Prostate cancer Father    Hypertension Maternal Grandfather    Hypertension Maternal Aunt    Endometrial cancer Maternal Aunt    Cancer Maternal Aunt    Colon cancer Neg Hx    Stomach cancer Neg Hx    Colon polyps Neg Hx    Esophageal cancer Neg Hx    Rectal cancer Neg Hx    Ovarian cancer Neg Hx    Pancreatic cancer Neg Hx    Breast cancer Neg Hx     Social History   Socioeconomic History   Marital status: Married    Spouse name: Not on file   Number of children: 0   Years of education: Not on file   Highest education level: Not on file  Occupational History   Occupation: Engineer, site: UNEMPLOYED  Tobacco Use   Smoking status: Former    Current packs/day: 0.00    Average packs/day: 2.0 packs/day for 5.0 years (10.0 ttl pk-yrs)    Types: Cigarettes    Start date: 03/06/1974    Quit date: 03/07/1979    Years since quitting: 44.4   Smokeless tobacco: Never  Vaping Use   Vaping status: Never Used  Substance and Sexual Activity   Alcohol use: No    Alcohol/week: 0.0 standard drinks of alcohol   Drug use: No   Sexual activity:  Not Currently    Partners: Male    Birth control/protection: Post-menopausal  Other Topics Concern   Not on file  Social History Narrative   Married: 0 kids   Regular exercise: walks 2 times a week   Caffeine use: none   Social Drivers of Corporate investment banker Strain: Not on file  Food Insecurity: Not on file  Transportation Needs: Not on file  Physical Activity: Not on file  Stress: Not on file  Social Connections: Not on file  Intimate Partner Violence: Not on file    Review of Systems  All other systems reviewed and are negative.   PHYSICAL EXAMINATION:   Ht 5\' 5"  (1.651 m)   Wt (!) 350 lb (158.8 kg)   LMP 05/09/2010 (Approximate)   BMI 58.24 kg/m     General appearance: alert, cooperative and appears stated age   Pelvic US Uterus 6/31 x 3/19 x 4.05 cm.  No myometrial masses.  EMS 2.77 mm.  Symmetrical.  No masses.  Echogenic area noted in the LUS which may represent a portion of the IUD that is visible.   Left ovary not seen. Right ovary not seen.  No adnexal masses.   No free fluid.  Exam is limited by BMI and patient discomfort with the ultrasound.  ASSESSMENT:  IUD check up.    PLAN:  [place] images and report reviewed.  Declines MRI due to difficulty with positioning for the procedure.  We discussed observational management versus surgical intervention.    {LABS (Optional):23779}  ***  total time was spent for this patient encounter, including preparation, face-to-face counseling with the patient, coordination of care, and documentation of the encounter.

## 2023-08-03 NOTE — Telephone Encounter (Signed)
 Jury Duty Case Number: [id number]  Date:09/20/2023  Patient also stated that she would like for you to also exempt from jury for the future.

## 2023-08-04 ENCOUNTER — Other Ambulatory Visit: Payer: Self-pay | Admitting: Internal Medicine

## 2023-08-07 NOTE — Telephone Encounter (Signed)
 Last visit with me 2023 why was this sent to me? Please schedule visit

## 2023-08-10 ENCOUNTER — Encounter: Payer: Self-pay | Admitting: Internal Medicine

## 2023-08-14 ENCOUNTER — Encounter (HOSPITAL_BASED_OUTPATIENT_CLINIC_OR_DEPARTMENT_OTHER): Payer: Self-pay | Admitting: Cardiology

## 2023-08-14 ENCOUNTER — Ambulatory Visit (HOSPITAL_BASED_OUTPATIENT_CLINIC_OR_DEPARTMENT_OTHER): Payer: 59 | Admitting: Cardiology

## 2023-08-14 ENCOUNTER — Encounter (HOSPITAL_BASED_OUTPATIENT_CLINIC_OR_DEPARTMENT_OTHER): Payer: Self-pay

## 2023-08-14 VITALS — BP 124/70 | HR 61 | Ht 65.0 in | Wt 350.0 lb

## 2023-08-14 DIAGNOSIS — R002 Palpitations: Secondary | ICD-10-CM

## 2023-08-14 DIAGNOSIS — R0609 Other forms of dyspnea: Secondary | ICD-10-CM

## 2023-08-14 DIAGNOSIS — R609 Edema, unspecified: Secondary | ICD-10-CM

## 2023-08-14 DIAGNOSIS — Q245 Malformation of coronary vessels: Secondary | ICD-10-CM

## 2023-08-14 DIAGNOSIS — I5189 Other ill-defined heart diseases: Secondary | ICD-10-CM

## 2023-08-14 NOTE — Patient Instructions (Addendum)
 Medication Instructions:  Your physician recommends that you continue on your current medications as directed. Please refer to the Current Medication list given to you today.   *If you need a refill on your cardiac medications before your next appointment, please call your pharmacy*  Follow-Up: At [facility], you and your health needs are our priority.  As part of our continuing mission to provide you with exceptional heart care, our providers are all part of one team.  This team includes your primary Cardiologist (physician) and Advanced Practice Providers or APPs (Physician Assistants and Nurse Practitioners) who all work together to provide you with the care you need, when you need it.  Your next appointment:   6 month(s)  Provider:   Jodelle Red, MD, Eligha Bridegroom, NP, or Gillian Shields, NP    We recommend signing up for the patient portal called "MyChart".  Sign up information is provided on this After Visit Summary.  MyChart is used to connect with patients for Virtual Visits (Telemedicine).  Patients are able to view lab/test results, encounter notes, upcoming appointments, etc.  Non-urgent messages can be sent to your provider as well.   To learn more about what you can do with MyChart, go to [email or web address].   Other Instructions Look into [employer] for fitted compression stockings: [address] [place], [place], [place] [address] [phone] [email or web address]   Try to find seated exercise (pedaling, upper arm, etc) to work to increase your heart rate. Start slow, a minute or two, and gradually increase as you can.

## 2023-08-14 NOTE — Telephone Encounter (Signed)
 Telephone encounter from me as well. With jury number and date

## 2023-08-14 NOTE — Progress Notes (Signed)
 Cardiology Office Note:  .   Date:  08/14/2023  ID:  Cassie [name], DOB 20-Mar-1961, MRN [id number] PCP: Myrlene Broker, MD  [facility] Providers Cardiologist:  Jodelle Red, MD {  History of Present Illness: .   Cassie [name] is a 63 y.o. female with PMH pSVT, PVCs, chest pain with myocardial bridging, history of PE on long term anticoagulation, ulcerative colitis, morbid obesity. She was previously followed by Dr. Okey Dupre and established care with me on 08/14/23.  Most recent CV tests: -Monitor 03/2023: 6 days, sinus with rare PACs/PVCs, 3 SVT episodes but brief (longest 8 beats). Symptomatic events were sinus with PACs -Echo 03/2023: EF 60-65%, G2DD, RV normal, no PASP 2/2 minimal TR, RAP 3, no significant valve disease -CT coronary 04/2023: mild CAD, cadrads 2, TPV 313, Ca score 163 (88th %ile). Brief superficial myocardial bridge in LAD  Today: Continues to have intermittent palpitations, most around 1 min, longest 5 min several months ago. Has a kardia mobile that she uses to check her rhythm periodically. No syncope.   Has rare intermittent focal chest soreness adjacent to the sternum on the left. Mild, nonexertional, brief/self limited.   Breathing has been poor, can't walk more than across the room before she is very short of breath. Also limited by hip/leg pain. We reviewed her echo and CT done within the last six months that does not suggest a cardiac etiology of this. Has been an issue since around 2017 for her. Had PFTs in 2018, noted small airway disease but felt this was not contributing at the time. We discussed cardiac, pulmonary, and conditioning related potential etiologies.   Has chronic LE edema, has been trying to get into lymphedema clinic. Fluid pills do not help. Keeps feet elevated constantly. Compression stockings and compression machine did not help the swelling.   ROS: Denies PND, orthopnea, change in LE edema or unexpected weight gain. No  syncope. ROS otherwise negative except as noted.   Studies Reviewed: Marland Kitchen    EKG:       Physical Exam:   VS:  BP 124/70   Pulse 61   Ht 5\' 5"  (1.651 m)   Wt (!) 350 lb (158.8 kg)   LMP 05/09/2010 (Approximate)   SpO2 97%   BMI 58.24 kg/m    Wt Readings from Last 3 Encounters:  08/14/23 (!) 350 lb (158.8 kg)  04/13/23 (!) 350 lb (158.8 kg)  03/17/23 (!) 350 lb (158.8 kg)    GEN: Well nourished, well developed in no acute distress HEENT: Normal, moist mucous membranes NECK: No JVD CARDIAC: regular rhythm, normal S1 and S2, no rubs or gallops. No murmur. VASCULAR: Radial pulses 2+ bilaterally. No carotid bruits RESPIRATORY:  Clear to auscultation without rales, wheezing or rhonchi  ABDOMEN: Soft, non-tender, non-distended MUSCULOSKELETAL: In wheelchair but moves all 4 limbs independently SKIN: Warm and dry, chronic firm edema bilateral LE. No pitting. No skin darkening, but skin texture beginning to harden/scale on bilateral lateral aspects NEUROLOGIC:  Alert and oriented x 3. No focal neuro deficits noted. PSYCHIATRIC:  Normal affect    ASSESSMENT AND PLAN: .    Palpitations pSVT, PVCs -on atenolol, continue -has Kardia mobile  History of chest pain, myocardial bridging -no recent chest pain, reviewed small nature of bridge  Dyspnea on exertion -does not appear cardiac given recent etiology -prior PFTs -strong suspicion for deconditioning. Discussed seated pedaling or other activity to try to increase cardiovascular fitness  Leg edema -discussed compression -not significant  helped with diuretics, discussed why this is -has been trying to get in with lymphedema clinic  Diastolic dysfunction -reviewed the potential etiologies, including internal (fibrosis/LVH) and external (weight/compression) -discussed SGLT2i today. She has had intermittent but mild history of UTI. She will consider, would like to discuss with her medical team  Morbid obesity, BMI 58 -we discussed  GLP1RA. She is concerned about any long acting meds or meds that affect the GI system, as she has issues with ulcerative colitis and issues post gallbladder removal. I recommended she discuss with her GI and primary team.  CV risk counseling and prevention -recommend heart healthy/Mediterranean diet, with whole grains, fruits, vegetable, fish, lean meats, nuts, and olive oil. Limit salt. -recommend moderate walking, 3-5 times/week for 30-50 minutes each session. Aim for at least 150 minutes.week. Goal should be pace of 3 miles/hours, or walking 1.5 miles in 30 minutes -recommend avoidance of tobacco products. Avoid excess alcohol. -ASCVD risk score: The 10-year ASCVD risk score (Arnett DK, et al., 2019) is: 6.9%   Values used to calculate the score:     Age: 82 years     Sex: Female     Is Non-Hispanic African American: No     Diabetic: No     Tobacco smoker: No     Systolic Blood Pressure: 124 mmHg     Is BP treated: Yes     HDL Cholesterol: 30.3 mg/dL     Total Cholesterol: 143 mg/dL    Dispo: 6 mos or sooner as needed  Total time of encounter: I spent 56 minutes dedicated to the care of this patient on the date of this encounter to include pre-visit review of records, face-to-face time with the patient discussing conditions above, and clinical documentation with the electronic health record. We specifically spent time today discussing palpitations, reviewing her Kardia strips, discussed LE edema and recommendations for management, reviewing her echo and discussing etiology/management of diastolic dysfunction   Signed, Jodelle Red, MD   Jodelle Red, MD, PhD, [facility]  [facility]  [place]  [facility] at [facility] at [facility] [address] [place], [place] [address] [phone]

## 2023-08-15 ENCOUNTER — Ambulatory Visit: Payer: 59

## 2023-08-15 ENCOUNTER — Ambulatory Visit: Payer: 59 | Admitting: Obstetrics and Gynecology

## 2023-08-15 VITALS — BP 134/82 | HR 74 | Ht 65.0 in | Wt 350.0 lb

## 2023-08-15 DIAGNOSIS — N950 Postmenopausal bleeding: Secondary | ICD-10-CM

## 2023-08-15 DIAGNOSIS — Z8742 Personal history of other diseases of the female genital tract: Secondary | ICD-10-CM

## 2023-08-15 DIAGNOSIS — Z30431 Encounter for routine checking of intrauterine contraceptive device: Secondary | ICD-10-CM

## 2023-08-15 DIAGNOSIS — R4589 Other symptoms and signs involving emotional state: Secondary | ICD-10-CM | POA: Diagnosis not present

## 2023-08-16 ENCOUNTER — Encounter: Payer: Self-pay | Admitting: Obstetrics and Gynecology

## 2023-08-22 NOTE — Telephone Encounter (Signed)
 Cassie [name]

## 2023-08-25 ENCOUNTER — Encounter (HOSPITAL_BASED_OUTPATIENT_CLINIC_OR_DEPARTMENT_OTHER): Payer: Self-pay

## 2023-08-31 ENCOUNTER — Ambulatory Visit (INDEPENDENT_AMBULATORY_CARE_PROVIDER_SITE_OTHER): Admitting: Internal Medicine

## 2023-08-31 ENCOUNTER — Encounter: Payer: Self-pay | Admitting: Internal Medicine

## 2023-08-31 ENCOUNTER — Ambulatory Visit: Admitting: Internal Medicine

## 2023-08-31 VITALS — BP 136/88 | HR 69 | Temp 97.8°F | Ht 65.0 in

## 2023-08-31 DIAGNOSIS — R0609 Other forms of dyspnea: Secondary | ICD-10-CM

## 2023-08-31 DIAGNOSIS — R4589 Other symptoms and signs involving emotional state: Secondary | ICD-10-CM

## 2023-08-31 DIAGNOSIS — Z0000 Encounter for general adult medical examination without abnormal findings: Secondary | ICD-10-CM | POA: Diagnosis not present

## 2023-08-31 DIAGNOSIS — E278 Other specified disorders of adrenal gland: Secondary | ICD-10-CM

## 2023-08-31 DIAGNOSIS — E782 Mixed hyperlipidemia: Secondary | ICD-10-CM

## 2023-08-31 DIAGNOSIS — K5100 Ulcerative (chronic) pancolitis without complications: Secondary | ICD-10-CM

## 2023-08-31 LAB — LIPID PANEL
Cholesterol: 151 mg/dL (ref 0–200)
HDL: 32.4 mg/dL — ABNORMAL LOW (ref >39.00)
LDL Cholesterol: 105 mg/dL — ABNORMAL HIGH (ref 0–99)
NonHDL: 118.78
Total CHOL/HDL Ratio: 5
Triglycerides: 67 mg/dL (ref 0.0–149.0)
VLDL: 13.4 mg/dL (ref 0.0–40.0)

## 2023-08-31 LAB — HEMOGLOBIN A1C: Hgb A1c MFr Bld: 5.9 % (ref 4.6–6.5)

## 2023-08-31 MED ORDER — ATENOLOL 50 MG PO TABS: 25.0000 mg | ORAL_TABLET | Freq: Two times a day (BID) | ORAL | 1 refills | Status: DC

## 2023-08-31 MED ORDER — ALPRAZOLAM 1 MG PO TABS: 1.0000 mg | ORAL_TABLET | Freq: Two times a day (BID) | ORAL | 5 refills | Status: DC | PRN

## 2023-08-31 NOTE — Assessment & Plan Note (Signed)
 Not on meds and overdue for colonoscopy but is not sure she is able to sit on the toilet long enough to do prep and move back and forth to the bathroom. Not on meds and no current flare. Continue close monitoring.

## 2023-08-31 NOTE — Assessment & Plan Note (Addendum)
 Counseled about glp-1 and she declines due to risk of side effect. She feels unable to lose weight and is not interested in pharmacologic intervention at this time. Checking HgA1c for screening.

## 2023-08-31 NOTE — Assessment & Plan Note (Signed)
 Severe and worsening. We did discus PFTs from 2018 with a reduced FRC and she will think about getting incentive spirometer and trying to move more. She declines GLP-1 as she is concerned about side effects.

## 2023-08-31 NOTE — Assessment & Plan Note (Signed)
 Flu shot yearly counseled. Pneumonia due. Shingrix due. Tetanus due. Colonoscopy due unable to perform physically at this time. Mammogram up to date, pap smear up to date. Counseled about sun safety and mole surveillance. Counseled about the dangers of distracted driving. Given 10 year screening recommendations.

## 2023-08-31 NOTE — Assessment & Plan Note (Signed)
 Does take xanax  1 mg BID prn and refilled today. She has declined other meds in the past and we did not change treatment today.

## 2023-08-31 NOTE — Progress Notes (Signed)
   Subjective:   Patient ID: Cassie [name], female    DOB: 13-Aug-1960, 63 y.o.   MRN: [id number]  HPI The patient is here for physical. Activity is extremely limited and knee pain is worsening.   PMH, [facility], social history reviewed and updated  Review of Systems  Constitutional:  Positive for activity change and fatigue.  HENT: Negative.    Eyes: Negative.   Respiratory:  Positive for shortness of breath. Negative for cough and chest tightness.   Cardiovascular:  Negative for chest pain, palpitations and leg swelling.  Gastrointestinal:  Negative for abdominal distention, abdominal pain, constipation, diarrhea, nausea and vomiting.  Musculoskeletal:  Positive for arthralgias, back pain, gait problem and myalgias.  Skin: Negative.   Psychiatric/Behavioral: Negative.      Objective:  Physical Exam Constitutional:      Appearance: She is well-developed. She is obese.  HENT:     Head: Normocephalic and atraumatic.  Cardiovascular:     Rate and Rhythm: Normal rate and regular rhythm.  Pulmonary:     Effort: Pulmonary effort is normal. No respiratory distress.     Breath sounds: Normal breath sounds. No wheezing or rales.  Abdominal:     General: Bowel sounds are normal. There is no distension.     Palpations: Abdomen is soft.     Tenderness: There is no abdominal tenderness. There is no rebound.  Musculoskeletal:        General: Tenderness present.     Cervical back: Normal range of motion.  Skin:    General: Skin is warm and dry.  Neurological:     Mental Status: She is alert and oriented to person, place, and time.     Coordination: Coordination abnormal.     Vitals:   08/31/23 0912  BP: 136/88  Pulse: 69  Temp: 97.8 F (36.6 C)  TempSrc: Oral  SpO2: 99%  Height: 5\' 5"  (1.651 m)    Assessment & Plan:

## 2023-08-31 NOTE — Assessment & Plan Note (Signed)
 No labs since 2023. Ordered today and not on meds.

## 2023-08-31 NOTE — Assessment & Plan Note (Signed)
 Does not require monitoring and no new signs of clinical hormone secretion. Stable.

## 2023-08-31 NOTE — Patient Instructions (Signed)
Incentive spirometer

## 2023-09-04 ENCOUNTER — Encounter: Payer: Self-pay | Admitting: Internal Medicine

## 2023-09-21 ENCOUNTER — Telehealth: Admitting: Obstetrics and Gynecology

## 2023-12-19 ENCOUNTER — Ambulatory Visit: Admitting: Family

## 2023-12-19 ENCOUNTER — Other Ambulatory Visit

## 2023-12-25 ENCOUNTER — Ambulatory Visit: Admitting: Family

## 2024-01-05 ENCOUNTER — Encounter: Payer: Self-pay | Admitting: Obstetrics and Gynecology

## 2024-01-09 ENCOUNTER — Inpatient Hospital Stay: Attending: Hematology & Oncology

## 2024-01-09 ENCOUNTER — Inpatient Hospital Stay

## 2024-01-09 DIAGNOSIS — D649 Anemia, unspecified: Secondary | ICD-10-CM | POA: Insufficient documentation

## 2024-01-09 DIAGNOSIS — R531 Weakness: Secondary | ICD-10-CM | POA: Diagnosis not present

## 2024-01-09 DIAGNOSIS — I890 Lymphedema, not elsewhere classified: Secondary | ICD-10-CM | POA: Diagnosis not present

## 2024-01-09 DIAGNOSIS — Z8639 Personal history of other endocrine, nutritional and metabolic disease: Secondary | ICD-10-CM

## 2024-01-09 DIAGNOSIS — Z86711 Personal history of pulmonary embolism: Secondary | ICD-10-CM | POA: Diagnosis not present

## 2024-01-09 DIAGNOSIS — R5382 Chronic fatigue, unspecified: Secondary | ICD-10-CM | POA: Insufficient documentation

## 2024-01-09 DIAGNOSIS — Z7901 Long term (current) use of anticoagulants: Secondary | ICD-10-CM | POA: Insufficient documentation

## 2024-01-09 LAB — CBC
HCT: 41 % (ref 36.0–46.0)
Hemoglobin: 13.1 g/dL (ref 12.0–15.0)
MCH: 30.7 pg (ref 26.0–34.0)
MCHC: 32 g/dL (ref 30.0–36.0)
MCV: 96 fL (ref 80.0–100.0)
Platelets: 235 K/uL (ref 150–400)
RBC: 4.27 MIL/uL (ref 3.87–5.11)
RDW: 13.6 % (ref 11.5–15.5)
WBC: 6.6 K/uL (ref 4.0–10.5)
nRBC: 0 % (ref 0.0–0.2)

## 2024-01-09 LAB — CMP (CANCER CENTER ONLY)
ALT: 10 U/L (ref 0–44)
AST: 20 U/L (ref 15–41)
Albumin: 3.6 g/dL (ref 3.5–5.0)
Alkaline Phosphatase: 85 U/L (ref 38–126)
Anion gap: 10 (ref 5–15)
BUN: 7 mg/dL — ABNORMAL LOW (ref 8–23)
CO2: 25 mmol/L (ref 22–32)
Calcium: 9 mg/dL (ref 8.9–10.3)
Chloride: 104 mmol/L (ref 98–111)
Creatinine: 0.75 mg/dL (ref 0.44–1.00)
GFR, Estimated: >60 mL/min (ref >60)
Glucose, Bld: 98 mg/dL (ref 70–99)
Potassium: 4.6 mmol/L (ref 3.5–5.1)
Sodium: 139 mmol/L (ref 135–145)
Total Bilirubin: 0.5 mg/dL (ref 0.0–1.2)
Total Protein: 7.1 g/dL (ref 6.5–8.1)

## 2024-01-09 LAB — IRON AND IRON BINDING CAPACITY (CC-WL,HP ONLY)
Iron: 95 ug/dL (ref 28–170)
Saturation Ratios: 29 % (ref 10.4–31.8)
TIBC: 333 ug/dL (ref 250–450)
UIBC: 238 ug/dL

## 2024-01-09 LAB — FERRITIN: Ferritin: 39 ng/mL (ref 11–307)

## 2024-01-09 NOTE — Telephone Encounter (Signed)
 BMD order faxed to [facility].Confirmation was received.

## 2024-01-10 NOTE — Telephone Encounter (Signed)
 Received fax back from [facility] it states patient is having health issues, she will call back next month to schedule an appt.

## 2024-01-15 ENCOUNTER — Encounter: Payer: Self-pay | Admitting: Obstetrics and Gynecology

## 2024-01-15 ENCOUNTER — Inpatient Hospital Stay (HOSPITAL_BASED_OUTPATIENT_CLINIC_OR_DEPARTMENT_OTHER): Admitting: Family

## 2024-01-15 DIAGNOSIS — M6281 Muscle weakness (generalized): Secondary | ICD-10-CM

## 2024-01-15 DIAGNOSIS — Z8639 Personal history of other endocrine, nutritional and metabolic disease: Secondary | ICD-10-CM

## 2024-01-15 DIAGNOSIS — I890 Lymphedema, not elsewhere classified: Secondary | ICD-10-CM | POA: Diagnosis not present

## 2024-01-15 DIAGNOSIS — T8332XA Displacement of intrauterine contraceptive device, initial encounter: Secondary | ICD-10-CM

## 2024-01-15 DIAGNOSIS — N950 Postmenopausal bleeding: Secondary | ICD-10-CM

## 2024-01-15 DIAGNOSIS — D649 Anemia, unspecified: Secondary | ICD-10-CM | POA: Diagnosis not present

## 2024-01-15 DIAGNOSIS — Z8742 Personal history of other diseases of the female genital tract: Secondary | ICD-10-CM

## 2024-01-15 DIAGNOSIS — I82401 Acute embolism and thrombosis of unspecified deep veins of right lower extremity: Secondary | ICD-10-CM | POA: Diagnosis not present

## 2024-01-15 DIAGNOSIS — N946 Dysmenorrhea, unspecified: Secondary | ICD-10-CM

## 2024-01-15 DIAGNOSIS — Z86711 Personal history of pulmonary embolism: Secondary | ICD-10-CM | POA: Diagnosis not present

## 2024-01-15 NOTE — Progress Notes (Signed)
 Hematology and Oncology Follow Up Visit  Cassie [name] [id number] [birth year] 63 y.o. 01/15/2024   Principle Diagnosis:  Idiopathic right lower lobe pulmonary embolism Ulcerative colitis   Current Therapy:        ELIQUIS  5 mg by mouth twice a day   Interim History: Patient identified using 2 identifiers on virtual visit.  Cassie [name] is doing fairly well. She had some vaginal blood loss last night for the first time in 6 months. She has contacted her gynecologist and is waiting to hear back from their office.  She states that she has internal hemorrhoids and occasionally notes some bright red blood in her stool with straining. No other blood loss noted. No abnormal bruising, no petechiae.  She has palpitations, SOB and occasional dizziness with over exertion and takes breaks to rest as needed.  No falls or syncope reported.  She has trouble ambulating due to the chronic swelling in her lower extremities which has restricted her ability to ambulate. We will place a referral for PT evaluation and treatment. She has chronic fatigue.  No fever, chills, n/v, cough, chest pain, abdominal pain or changes in bowel or bladder habits.  No numbness or tingling in her extremities.  Appetite is fair and hydration is good. Weight she states has remained stable.   ECOG Performance Status: 1 - Symptomatic but completely ambulatory  Medications:  Allergies as of 01/15/2024       Reactions   Benadryl [diphenhydramine] Anaphylaxis   Diphenhydramine Hcl Swelling   Throat feels like it swells    Doxycycline  Palpitations   Hydrocodone  Other (See Comments)   Hallucinations    Sudafed [pseudoephedrine Hcl] Shortness Of Breath   Azithromycin  Palpitations   Speeding heart rate per pt.   Chlorhexidine  Gluconate Itching   Burning   Ibuprofen  Itching   Lactose Intolerance (gi) Diarrhea, Other (See Comments)   bloating   Mesalamine  Other (See Comments)   Hair loss   Caffeine Palpitations   Codeine   Other (See Comments)   Other Other (See Comments)   Fillers in some medication/ Heart palpitations Gi upset   Penicillins Rash   Has patient had a PCN reaction causing immediate rash, facial/tongue/throat swelling, SOB or lightheadedness with hypotension: Yes Has patient had a PCN reaction causing severe rash involving mucus membranes or skin necrosis: No Has patient had a PCN reaction that required hospitalization Yes Has patient had a PCN reaction occurring within the last 10 years: No If all of the above answers are NO, then may proceed with Cephalosporin use.   Prednisone Palpitations        Medication List        Accurate as of January 15, 2024  1:42 PM. If you have any questions, ask your nurse or doctor.          acetaminophen  500 MG tablet Commonly known as: TYLENOL  Take 1,000 mg by mouth every 6 (six) hours as needed for moderate pain or headache.   ALPRAZolam  1 MG tablet Commonly known as: XANAX  Take 1 tablet (1 mg total) by mouth 2 (two) times daily as needed for anxiety.   atenolol  50 MG tablet Commonly known as: TENORMIN  Take 0.5 tablets (25 mg total) by mouth 2 (two) times daily.   cholecalciferol 25 MCG (1000 UNIT) tablet Commonly known as: VITAMIN D3 Take 2,000 Units by mouth daily.   cyanocobalamin 1000 MCG tablet Take 1,000 mcg by mouth daily.   Eliquis  5 MG Tabs tablet Generic drug: apixaban  TAKE 1 TABLET(5  MG) BY MOUTH TWICE DAILY   Mirena  (52 MG) 20 MCG/24HR Iud Generic drug: levonorgestrel  1 each by Intrauterine route once.        Allergies:  Allergies  Allergen Reactions   Benadryl [Diphenhydramine] Anaphylaxis   Diphenhydramine Hcl Swelling    Throat feels like it swells    Doxycycline  Palpitations   Hydrocodone  Other (See Comments)    Hallucinations    Sudafed [Pseudoephedrine Hcl] Shortness Of Breath   Azithromycin  Palpitations    Speeding heart rate per pt.    Chlorhexidine  Gluconate Itching    Burning   Ibuprofen   Itching   Lactose Intolerance (Gi) Diarrhea and Other (See Comments)    bloating   Mesalamine  Other (See Comments)    Hair loss   Caffeine Palpitations   Codeine  Other (See Comments)   Other Other (See Comments)    Fillers in some medication/ Heart palpitations Gi upset   Penicillins Rash    Has patient had a PCN reaction causing immediate rash, facial/tongue/throat swelling, SOB or lightheadedness with hypotension: Yes Has patient had a PCN reaction causing severe rash involving mucus membranes or skin necrosis: No Has patient had a PCN reaction that required hospitalization Yes Has patient had a PCN reaction occurring within the last 10 years: No If all of the above answers are NO, then may proceed with Cephalosporin use.   Prednisone Palpitations    Past Medical History, Surgical history, Social history, and Family History were reviewed and updated.  Review of Systems: All other 10 point review of systems is negative.   Physical Exam:  vitals were not taken for this visit.   Wt Readings from Last 3 Encounters:  08/15/23 (!) 350 lb (158.8 kg)  08/14/23 (!) 350 lb (158.8 kg)  04/13/23 (!) 350 lb (158.8 kg)    Ocular: Sclerae unicteric, pupils equal, round and reactive to light Ear-nose-throat: Oropharynx clear, dentition fair Lymphatic: No cervical or supraclavicular adenopathy Lungs no rales or rhonchi, good excursion bilaterally Heart regular rate and rhythm, no murmur appreciated Abd soft, nontender, positive bowel sounds MSK no focal spinal tenderness, no joint edema Neuro: non-focal, well-oriented, appropriate affect Breasts: Deferred   Lab Results  Component Value Date   WBC 6.6 01/09/2024   HGB 13.1 01/09/2024   HCT 41.0 01/09/2024   MCV 96.0 01/09/2024   PLT 235 01/09/2024   Lab Results  Component Value Date   FERRITIN 39 01/09/2024   IRON 95 01/09/2024   TIBC 333 01/09/2024   UIBC 238 01/09/2024   IRONPCTSAT 29 01/09/2024   Lab Results   Component Value Date   RETICCTPCT 1.3 04/13/2023   RBC 4.27 01/09/2024   No results found for: KPAFRELGTCHN, LAMBDASER, [facility] Lab Results  Component Value Date   IGA 341 05/25/2015   No results found for: STEPHANY CARLOTA BENSON MARKEL EARLA JOANNIE DOC VICK, SPEI   Chemistry      Component Value Date/Time   NA 139 01/09/2024 1405   NA 142 03/17/2023 1049   NA 139 01/24/2017 0929   K 4.6 01/09/2024 1405   K 4.0 01/24/2017 0929   CL 104 01/09/2024 1405   CL 103 10/10/2016 1403   CL 105 09/09/2016 1332   CO2 25 01/09/2024 1405   CO2 24 01/24/2017 0929   BUN 7 (L) 01/09/2024 1405   BUN 8 03/17/2023 1049   BUN 9.5 01/24/2017 0929   CREATININE 0.75 01/09/2024 1405   CREATININE 0.86 01/31/2020 0835   CREATININE 0.8 01/24/2017 0929  Component Value Date/Time   CALCIUM 9.0 01/09/2024 1405   CALCIUM 9.7 01/24/2017 0929   ALKPHOS 85 01/09/2024 1405   ALKPHOS 107 01/24/2017 0929   AST 20 01/09/2024 1405   AST 18 01/24/2017 0929   ALT 10 01/09/2024 1405   ALT 16 01/24/2017 0929   BILITOT 0.5 01/09/2024 1405   BILITOT 0.31 01/24/2017 0929       Impression and Plan: Cassie [name] is a pleasant 63 yo caucasian female with history of PE on Eliquis  5 mg PO BID as well as anemia.  Iron studies are stable. No infusion needed at this time.  She will continue her same regimen with Eliquis  at this time.  She will let us  know if her gynecologist determines she needs surgery.  Referral placed with PT for evaluation and treatment of weakness and lymphedema.  Follow-up in 6 months.   Lauraine Pepper, NP 9/8/20251:42 PM

## 2024-01-16 NOTE — Telephone Encounter (Signed)
 I think we need to do the RLH(robotic hysterectomy), BSO(removal of tubes and ovaries), cystoscopy(looks at the bladder at the end of the case) at this time. We will have to get cardiac and medical clearance. I will need about an hour for the case and will have to have your heart and lungs be able to go through the anesthesia portion. There is a risk for an open case, if your lungs cannot tolerate the anesthesia with the tilt on your lungs. It would be better however, if we can do the robotic hysterectomy, however. Dr. Glennon I will send to Dr. Nikki as well and appreciate any input she has.

## 2024-02-08 ENCOUNTER — Other Ambulatory Visit: Payer: Self-pay | Admitting: Hematology & Oncology

## 2024-02-08 ENCOUNTER — Encounter: Payer: Self-pay | Admitting: Family

## 2024-02-08 DIAGNOSIS — Z86711 Personal history of pulmonary embolism: Secondary | ICD-10-CM

## 2024-02-08 DIAGNOSIS — I82401 Acute embolism and thrombosis of unspecified deep veins of right lower extremity: Secondary | ICD-10-CM

## 2024-02-12 ENCOUNTER — Encounter: Payer: Self-pay | Admitting: Obstetrics and Gynecology

## 2024-02-14 ENCOUNTER — Telehealth: Admitting: Obstetrics and Gynecology

## 2024-02-20 ENCOUNTER — Ambulatory Visit (HOSPITAL_BASED_OUTPATIENT_CLINIC_OR_DEPARTMENT_OTHER): Admitting: Pulmonary Disease

## 2024-02-20 ENCOUNTER — Encounter (HOSPITAL_BASED_OUTPATIENT_CLINIC_OR_DEPARTMENT_OTHER): Payer: Self-pay | Admitting: Pulmonary Disease

## 2024-02-20 VITALS — BP 138/71 | HR 57 | Ht 65.0 in | Wt 350.0 lb

## 2024-02-20 DIAGNOSIS — F419 Anxiety disorder, unspecified: Secondary | ICD-10-CM

## 2024-02-20 DIAGNOSIS — R5381 Other malaise: Secondary | ICD-10-CM

## 2024-02-20 DIAGNOSIS — Z87891 Personal history of nicotine dependence: Secondary | ICD-10-CM

## 2024-02-20 DIAGNOSIS — Z86711 Personal history of pulmonary embolism: Secondary | ICD-10-CM | POA: Diagnosis not present

## 2024-02-20 DIAGNOSIS — G4733 Obstructive sleep apnea (adult) (pediatric): Secondary | ICD-10-CM

## 2024-02-20 DIAGNOSIS — K219 Gastro-esophageal reflux disease without esophagitis: Secondary | ICD-10-CM

## 2024-02-20 DIAGNOSIS — R0602 Shortness of breath: Secondary | ICD-10-CM

## 2024-02-20 DIAGNOSIS — K5190 Ulcerative colitis, unspecified, without complications: Secondary | ICD-10-CM

## 2024-02-20 DIAGNOSIS — I2510 Atherosclerotic heart disease of native coronary artery without angina pectoris: Secondary | ICD-10-CM

## 2024-02-20 NOTE — Progress Notes (Signed)
138 

## 2024-02-20 NOTE — Patient Instructions (Signed)
 Shortness of breath --Reviewed pulmonary function tests in 2018. Hesitant to try inhalers unless needed --Repeat before and after spirometry and if positive will discuss inhaler benefit and options  Mild OSA Weight gain Last sleep test 09/19/17 --ORDER home sleep study

## 2024-02-20 NOTE — Progress Notes (Signed)
 Subjective:   PATIENT ID: Cassie [name] GENDER: female DOB: 05-Jan-1961, MRN: [id number]  Chief Complaint  Patient presents with   Establish Care    Reason for Visit: New consult for shortness of breath  [name] is a 63 year old female former smoker with cough variant asthma, allergic rhinitis, hx PE on AC, CAD, SVT/NSVT, GERD and ulcerative colitis who presents for shortness of breath with activity.  She reports shortness of breath since 2017 and has gradually worsened. No associated cough or wheezing. Initially thought related to environmental allergies like ragweed. In the same year she hurt her knee in the fall and became more sedentary. Was diagnosed 06/2016 with pulmonary embolism and on eliquis . She has had significant 30 lbs in the last year. Limited activity and shortness of breath with activity. She has poor sleep quality and fatigue due to leg pain.   Social History: Former smoker. Smoked 2 ppd x 6 years. Second hand smoke exposure  I have personally reviewed patient's past medical/family/social history, allergies, current medications.  Past Medical History:  Diagnosis Date   Adenomatous colon polyp    Adrenal nodule    AIVR (accelerated idioventricular rhythm)    Allergy     Anemia    in her early twenties   Anxiety    Arthritis    knees,thumbs   Cataract    bilateral removed    Clotting disorder 1985?   Heavy bleeding and constant periods. Then in 2020?, endometrial post menopausal bleeding   Complication of anesthesia    BP dropped with a colonoscopy in [place]. no problems since.   Cyst of kidney, acquired    cyst right kidney- benign   Degenerative disc disease, lumbar    Dyspnea    Dysrhythmia    palpitations   Edema    Gallstones    History of anemia    20 yrs. ago not now- pt denies    History of hiatal hernia    Hyperlipidemia ?   Not sure what this is?   IUD (intrauterine device) in place    Mild CAD    Morbid obesity (HCC)     Myocardial bridge    NSVT (nonsustained ventricular tachycardia) (HCC)    Osteoarthritis    Other allergy , other than to medicinal agents    Panic disorder    Premature atrial contractions    PSVT (paroxysmal supraventricular tachycardia)    Pulmonary embolism (HCC)    PVC's (premature ventricular contractions)    Run of atrial premature complexes    Ulcerative colitis, unspecified    Vitamin B12 deficiency    Vitamin D  deficiency    Wheelchair bound      Family History  Problem Relation Age of Onset   Cancer Mother    Varicose Veins Mother    Heart disease Father 72       pacemaker   Atrial fibrillation Father    Prostate cancer Father    Alcohol abuse Father    Cancer Father    Hypertension Father    Hypertension Sister    Hypertension Maternal Grandfather    Heart disease Paternal Grandfather    Hypertension Maternal Aunt    Endometrial cancer Maternal Aunt    Cancer Maternal Aunt    Varicose Veins Maternal Aunt    Cancer Maternal Aunt    Cancer Maternal Aunt    Heart disease Paternal Uncle    Hypertension Maternal Aunt    Colon cancer Neg Hx  Stomach cancer Neg Hx    Colon polyps Neg Hx    Esophageal cancer Neg Hx    Rectal cancer Neg Hx    Ovarian cancer Neg Hx    Pancreatic cancer Neg Hx    Breast cancer Neg Hx      Social History   Occupational History   Occupation: Engineer, site: UNEMPLOYED  Tobacco Use   Smoking status: Former    Current packs/day: 0.00    Average packs/day: 2.0 packs/day for 5.0 years (10.0 ttl pk-yrs)    Types: Cigarettes    Start date: 03/06/1974    Quit date: 03/07/1979    Years since quitting: 44.9   Smokeless tobacco: Never  Vaping Use   Vaping status: Never Used  Substance and Sexual Activity   Alcohol use: No   Drug use: No   Sexual activity: Not Currently    Partners: Male    Birth control/protection: Post-menopausal    Allergies  Allergen Reactions   Benadryl [Diphenhydramine] Anaphylaxis    Diphenhydramine Hcl Swelling    Throat feels like it swells    Doxycycline  Palpitations   Hydrocodone  Other (See Comments)    Hallucinations    Sudafed [Pseudoephedrine Hcl] Shortness Of Breath   Azithromycin  Palpitations    Speeding heart rate per pt.    Chlorhexidine  Gluconate Itching    Burning   Ibuprofen  Itching   Lactose Intolerance (Gi) Diarrhea and Other (See Comments)    bloating   Mesalamine  Other (See Comments)    Hair loss   Caffeine Palpitations   Codeine  Other (See Comments)   Other Other (See Comments)    Fillers in some medication/ Heart palpitations Gi upset   Penicillins Rash    Has patient had a PCN reaction causing immediate rash, facial/tongue/throat swelling, SOB or lightheadedness with hypotension: Yes Has patient had a PCN reaction causing severe rash involving mucus membranes or skin necrosis: No Has patient had a PCN reaction that required hospitalization Yes Has patient had a PCN reaction occurring within the last 10 years: No If all of the above answers are NO, then may proceed with Cephalosporin use.   Prednisone Palpitations     Outpatient Medications Prior to Visit  Medication Sig Dispense Refill   acetaminophen  (TYLENOL ) 500 MG tablet Take 1,000 mg by mouth every 6 (six) hours as needed for moderate pain or headache.     ALPRAZolam  (XANAX ) 1 MG tablet Take 1 tablet (1 mg total) by mouth 2 (two) times daily as needed for anxiety. 60 tablet 5   atenolol  (TENORMIN ) 50 MG tablet Take 0.5 tablets (25 mg total) by mouth 2 (two) times daily. 90 tablet 1   cholecalciferol (VITAMIN D3) 25 MCG (1000 UNIT) tablet Take 2,000 Units by mouth daily.     cyanocobalamin 1000 MCG tablet Take 1,000 mcg by mouth daily.     ELIQUIS  5 MG TABS tablet TAKE 1 TABLET(5 MG) BY MOUTH TWICE DAILY 60 tablet 6   levonorgestrel  (MIRENA , 52 MG,) 20 MCG/24HR IUD 1 each by Intrauterine route once.     No facility-administered medications prior to visit.    Review of Systems   Constitutional:  Positive for malaise/fatigue. Negative for chills, diaphoresis, fever and weight loss.  HENT:  Negative for congestion.   Respiratory:  Positive for shortness of breath. Negative for cough, hemoptysis, sputum production and wheezing.   Cardiovascular:  Negative for chest pain, palpitations and leg swelling.  Endo/Heme/Allergies:  Positive for environmental allergies.  Objective:   Vitals:   02/20/24 1457  BP: 138/71  Pulse: (!) 57  SpO2: 97%  Weight: (!) 350 lb (158.8 kg)  Height: 5' 5 (1.651 m)   SpO2: 97 %  Body mass index is 58.24 kg/m.  Physical Exam: General: Well-appearing, no acute distress HENT: [place], AT Eyes: EOMI, no scleral icterus Respiratory: Clear to auscultation bilaterally.  No crackles, wheezing or rales Cardiovascular: RRR, -M/R/G, no JVD Extremities:-Edema,-tenderness Neuro: AAO x4, CNII-XII grossly intact Psych: Normal mood, normal affect  Data Reviewed:  Imaging: CTA 07/04/16 - Pulmonary embolus to the right lower lung lobe. Calcified mediastinal nodes CTA 06/10/20 - No filling defect. Old unchanged granulomatous disease  PFT: 08/04/16 FVC 3.04 (86%) FEV1 2.28 (83%) Ratio 75  TLC 85% DLCO 87% Interpretation: Normal pulmonary PFTs. Partial bronchodilator response in FEV1  Labs: CBC    Component Value Date/Time   WBC 6.6 01/09/2024 1405   RBC 4.27 01/09/2024 1405   HGB 13.1 01/09/2024 1405   HGB 13.6 04/13/2023 0851   HGB 13.7 03/17/2023 1049   HGB 13.2 01/24/2017 0929   HCT 41.0 01/09/2024 1405   HCT 43.2 03/17/2023 1049   HCT 40.9 01/24/2017 0929   PLT 235 01/09/2024 1405   PLT 222 04/13/2023 0851   PLT 246 03/17/2023 1049   MCV 96.0 01/09/2024 1405   MCV 95 03/17/2023 1049   MCV 94 01/24/2017 0929   MCH 30.7 01/09/2024 1405   MCHC 32.0 01/09/2024 1405   RDW 13.6 01/09/2024 1405   RDW 12.5 03/17/2023 1049   RDW 14.6 01/24/2017 0929   LYMPHSABS 1.5 04/13/2023 0851   LYMPHSABS 1.8 03/06/2020 0847   LYMPHSABS 1.8  01/24/2017 0929   MONOABS 0.5 04/13/2023 0851   EOSABS 0.2 04/13/2023 0851   EOSABS 0.2 03/06/2020 0847   EOSABS 0.2 01/24/2017 0929   BASOSABS 0.0 04/13/2023 0851   BASOSABS 0.0 03/06/2020 0847   BASOSABS 0.0 01/24/2017 0929   Echocardiogram 03/17/23 - Normal EF. Grade II DD     Assessment & Plan:   Discussion: 63 year old female former smoker with cough variant asthma, allergic rhinitis, hx PE on AC, morbid obesity CAD, SVT/NSVT, GERD and ulcerative colitis who presents for shortness of breath with activity. Shortness of breath likely multifactorial. Suspect deconditioning is driving this in the setting of morbid obesity and sedentary lifestyle. With recent weight gain worsening uncontrolled OSA considered in the differential. I spent >50% visit reviewing records in EMR including PCP and hematology notes, addressing patient questions and concerns about her overall health and potential causes of her dyspnea.   Assessment & Plan Shortness of breath --Reviewed pulmonary function tests in 2018. Hesitant to try inhalers unless needed --Repeat before and after spirometry and if positive will discuss inhaler benefit and options --Will need to consider home physical therapy options OSA (obstructive sleep apnea) Last sleep test 09/19/17 --ORDER home sleep study History of pulmonary embolism Unclear etiology however suspect sedentary lifestyle poses risk --Followed Hematology --Continue eliquis  5 mg BID Physical deconditioning --Has been referred for PT for weakness and lymphedema by Hematology Morbid obesity (HCC) --Followed by Dr. Rollene, PCP --Declined glp-1 due to side effects and not interested in pharmacologic intervention on 08/31/23 note Anxiety --On xanax  per PCP    Health Maintenance Immunization History  Administered Date(s) Administered   Influenza-Unspecified 02/20/2018   PFIZER(Purple Top)SARS-COV-2 Vaccination 08/05/2019, 08/28/2019, 04/11/2020   Tdap 05/09/2012    CT Lung Screen - not qualified  Orders Placed This Encounter  Procedures  Pulmonary function test    Standing Status:   Future    Expiration Date:   02/19/2025    Where should this test be performed?:   Outpatient Pul[facility]    What type of PFT is being ordered?:   Simple Spirometry Pre/Post Bronchodilator   Home sleep test    Standing Status:   Future    Expiration Date:   02/19/2025    Where should this test be performed::   [facility]  No orders of the defined types were placed in this encounter.   Return in about 6 weeks (around 04/02/2024) for after PFT.  I have spent a total time of 45-minutes on the day of the appointment reviewing prior documentation, coordinating care and discussing medical diagnosis and plan with the patient/family. Imaging, labs and tests included in this note have been reviewed and interpreted independently by me.  Betsabe Iglesia Slater Staff, MD [facility] 02/20/2024 3:48 PM

## 2024-02-21 ENCOUNTER — Encounter (HOSPITAL_BASED_OUTPATIENT_CLINIC_OR_DEPARTMENT_OTHER): Payer: Self-pay | Admitting: Pulmonary Disease

## 2024-02-21 NOTE — Telephone Encounter (Signed)
Any advice for pt?

## 2024-02-23 NOTE — Telephone Encounter (Signed)
 FYI

## 2024-02-26 ENCOUNTER — Encounter: Payer: Self-pay | Admitting: Obstetrics and Gynecology

## 2024-02-27 NOTE — Assessment & Plan Note (Signed)
--  Followed by Dr. Rollene, PCP --Declined glp-1 due to side effects and not interested in pharmacologic intervention on 08/31/23 note

## 2024-02-27 NOTE — Assessment & Plan Note (Signed)
 Unclear etiology however suspect sedentary lifestyle poses risk --Followed Hematology --Continue eliquis  5 mg BID

## 2024-03-01 ENCOUNTER — Encounter (HOSPITAL_BASED_OUTPATIENT_CLINIC_OR_DEPARTMENT_OTHER): Payer: Self-pay | Admitting: Pulmonary Disease

## 2024-03-04 NOTE — Telephone Encounter (Signed)
 Can we check on order to snap

## 2024-03-06 NOTE — Telephone Encounter (Signed)
 Jennifer from [facility] called back with an update on the patient. They was able to get in contact with Mrs. Haft and get her set up for equipment delivery, nothing further needed at this time.

## 2024-03-06 NOTE — Telephone Encounter (Signed)
 Called Cassie [name] from [facility]. She confirmed they received the HST order and attempted to reach the patient but were unable to contact her. Delon stated they will call back to get the patient registered and have the equipment sent out. Called patient to provide an update no answer and unable to leave a voicemail. I will reach back out to Cassie [name].

## 2024-03-10 ENCOUNTER — Other Ambulatory Visit: Payer: Self-pay | Admitting: Internal Medicine

## 2024-03-10 ENCOUNTER — Encounter: Payer: Self-pay | Admitting: Internal Medicine

## 2024-03-11 MED ORDER — ALPRAZOLAM 1 MG PO TABS: 1.0000 mg | ORAL_TABLET | Freq: Two times a day (BID) | ORAL | 5 refills | Status: AC | PRN

## 2024-03-15 ENCOUNTER — Encounter

## 2024-03-15 DIAGNOSIS — G4733 Obstructive sleep apnea (adult) (pediatric): Secondary | ICD-10-CM

## 2024-03-21 ENCOUNTER — Telehealth (HOSPITAL_BASED_OUTPATIENT_CLINIC_OR_DEPARTMENT_OTHER): Payer: Self-pay | Admitting: Cardiology

## 2024-03-21 ENCOUNTER — Telehealth: Payer: Self-pay | Admitting: Internal Medicine

## 2024-03-21 DIAGNOSIS — K51019 Ulcerative (chronic) pancolitis with unspecified complications: Secondary | ICD-10-CM

## 2024-03-21 NOTE — Telephone Encounter (Signed)
 Patient stated she is having mobility issues and wants to know if Dr. Lonni can do a tele-visit instead on 05/30/24.

## 2024-03-21 NOTE — Telephone Encounter (Signed)
 Will forward to Dr. Lonni and her RN for further advisement and follow-up with the pt about switching OV to virtual in Jan, due to mobility issues.

## 2024-03-21 NOTE — Telephone Encounter (Signed)
 Inbound call from patient stating she has been having motitly issues and its hard to walk. She states she thinks she is having a colitis flare up and is worried about having a colonoscopy even though she is having issues walking. She wants to have the procedure but wants to see if she could have a phone or video visit to discuss. Please advise.

## 2024-03-21 NOTE — Telephone Encounter (Signed)
 Ok for video visit with me or APP JMP

## 2024-03-22 DIAGNOSIS — G4733 Obstructive sleep apnea (adult) (pediatric): Secondary | ICD-10-CM | POA: Diagnosis not present

## 2024-03-22 NOTE — Telephone Encounter (Signed)
 Lonni Slain, MD to Me (Selected Message)     03/22/24  7:01 AM While I think virtual visits are excellent and very useful with mobility issues, many insurance plans including all of Medicare no longer cover them. I do not have a way to tell if her particular plan covers virtual visits, but if she would like to contact them and check, I am happy to do it virtually. I just do not want her to be stuck with a large bill if her plan does not cover them. Thanks!   Returned a call back to the pt and endorsed the above from Dr. Lonni.   Pt states she is recovering from a colitis flare-up, and will plan to see Dr. Lonni in January, as scheduled for an office visit.   Pt was appreciative for the call back and assistance.

## 2024-03-26 ENCOUNTER — Other Ambulatory Visit: Payer: Self-pay | Admitting: Obstetrics and Gynecology

## 2024-03-26 DIAGNOSIS — N950 Postmenopausal bleeding: Secondary | ICD-10-CM

## 2024-03-28 ENCOUNTER — Ambulatory Visit (HOSPITAL_BASED_OUTPATIENT_CLINIC_OR_DEPARTMENT_OTHER): Admitting: Cardiology

## 2024-04-02 ENCOUNTER — Encounter: Payer: Self-pay | Admitting: Internal Medicine

## 2024-04-03 ENCOUNTER — Encounter (HOSPITAL_BASED_OUTPATIENT_CLINIC_OR_DEPARTMENT_OTHER)

## 2024-04-03 ENCOUNTER — Encounter: Payer: Self-pay | Admitting: Family

## 2024-04-03 ENCOUNTER — Ambulatory Visit (HOSPITAL_BASED_OUTPATIENT_CLINIC_OR_DEPARTMENT_OTHER): Admitting: Pulmonary Disease

## 2024-04-03 ENCOUNTER — Ambulatory Visit (HOSPITAL_BASED_OUTPATIENT_CLINIC_OR_DEPARTMENT_OTHER): Payer: Self-pay | Admitting: Pulmonary Disease

## 2024-04-03 MED ORDER — SULFASALAZINE 500 MG PO TABS: 500.0000 mg | ORAL_TABLET | Freq: Four times a day (QID) | ORAL | 0 refills | Status: DC

## 2024-04-03 NOTE — Progress Notes (Signed)
 Pt notified and she wishes to wait until her visit in January to discuss

## 2024-04-09 MED ORDER — ATENOLOL 50 MG PO TABS: 25.0000 mg | ORAL_TABLET | Freq: Two times a day (BID) | ORAL | 3 refills | Status: AC

## 2024-04-09 NOTE — Addendum Note (Signed)
 Addended by: ROLLENE NORRIS A on: 04/09/2024 04:21 PM   Modules accepted: Orders

## 2024-04-09 NOTE — Telephone Encounter (Signed)
 Patient reports she has had UC symptoms since the first week in November without change. Patient reports the following symptoms: diarrhea, urgency, odor that she cannot describe, small amount of mucous, consistent blood with every BM with occasional clots. Patient has been tolerating a bland diet but avoids eating due to increased BM's. Patient also reports tenderness at the bellybutton. Patient did reach out to PCP who prescribed Sulfasalazine  however did not take it because it requires monitoring of LFT's & kidney function. Patient has been scheduled for a F/U with Tina on 04/16/24 @ 1:30 PM. Patient did question having stool test would be needed prior to appt. Please advise.

## 2024-04-09 NOTE — Telephone Encounter (Signed)
 PT is requesting that Dr. Albertus place a lab order to test her stool. She is very concerned and would like to further discuss options for her colitis flare. Please advise.

## 2024-04-10 NOTE — Telephone Encounter (Signed)
 Patient has 2 open surgery referrals.   Please review and advise on which referral needs to be closed.

## 2024-04-11 ENCOUNTER — Telehealth: Payer: Self-pay | Admitting: Pediatrics

## 2024-04-11 ENCOUNTER — Encounter: Payer: Self-pay | Admitting: Family

## 2024-04-11 NOTE — Telephone Encounter (Signed)
 Paged on-call by Ms. Swint with questions regarding stool test handling.  She was provided with kits for fecal calprotectin and C. difficile testing by our office today.  States she is aware that the fecal calprotectin specimen needs to be frozen but was unsure regarding the handling instructions for C. difficile specimen.  Requisition indicates that it is a [employer] test.  Online information for your offense/[employer] C. difficile specimen handling recommends refrigerating specimens.  Advised patient to follow these instructions.  She can contact the office during business hours if any other questions arise.

## 2024-04-11 NOTE — Telephone Encounter (Signed)
 03/26/24 surgery referral for myosure D&C, ECC remove IUD and place new mirena  IUD closed.   See open surgery referral dated 01/17/24.

## 2024-04-11 NOTE — Telephone Encounter (Signed)
 Orders in Epic. [employer] stool kit placed at front desk. Patient notified via MyChart.

## 2024-04-11 NOTE — Telephone Encounter (Signed)
 Fecal calprotectin, [employer] GI pathogen panel plus C. Difficile CBC, CMP and CRP

## 2024-04-12 ENCOUNTER — Other Ambulatory Visit (INDEPENDENT_AMBULATORY_CARE_PROVIDER_SITE_OTHER)

## 2024-04-12 DIAGNOSIS — K51019 Ulcerative (chronic) pancolitis with unspecified complications: Secondary | ICD-10-CM | POA: Diagnosis not present

## 2024-04-16 ENCOUNTER — Other Ambulatory Visit

## 2024-04-16 ENCOUNTER — Ambulatory Visit: Payer: Self-pay | Admitting: Internal Medicine

## 2024-04-16 ENCOUNTER — Telehealth: Payer: Self-pay

## 2024-04-16 ENCOUNTER — Ambulatory Visit: Admitting: Physician Assistant

## 2024-04-16 ENCOUNTER — Encounter: Payer: Self-pay | Admitting: Physician Assistant

## 2024-04-16 VITALS — BP 120/70 | HR 62 | Ht 65.0 in | Wt 350.0 lb

## 2024-04-16 DIAGNOSIS — R1084 Generalized abdominal pain: Secondary | ICD-10-CM

## 2024-04-16 DIAGNOSIS — K51019 Ulcerative (chronic) pancolitis with unspecified complications: Secondary | ICD-10-CM

## 2024-04-16 DIAGNOSIS — K625 Hemorrhage of anus and rectum: Secondary | ICD-10-CM

## 2024-04-16 DIAGNOSIS — R197 Diarrhea, unspecified: Secondary | ICD-10-CM

## 2024-04-16 LAB — C-REACTIVE PROTEIN: CRP: 1.8 mg/dL (ref 0.5–20.0)

## 2024-04-16 LAB — CBC WITH DIFFERENTIAL/PLATELET
Basophils Absolute: 0 K/uL (ref 0.0–0.1)
Basophils Relative: 0.4 % (ref 0.0–3.0)
Eosinophils Absolute: 0.4 K/uL (ref 0.0–0.7)
Eosinophils Relative: 6 % — ABNORMAL HIGH (ref 0.0–5.0)
HCT: 40.8 % (ref 36.0–46.0)
Hemoglobin: 13.4 g/dL (ref 12.0–15.0)
Lymphocytes Relative: 17 % (ref 12.0–46.0)
Lymphs Abs: 1.2 K/uL (ref 0.7–4.0)
MCHC: 32.8 g/dL (ref 30.0–36.0)
MCV: 94 fl (ref 78.0–100.0)
Monocytes Absolute: 0.4 K/uL (ref 0.1–1.0)
Monocytes Relative: 5.9 % (ref 3.0–12.0)
Neutro Abs: 5 K/uL (ref 1.4–7.7)
Neutrophils Relative %: 70.7 % (ref 43.0–77.0)
Platelets: 237 K/uL (ref 150.0–400.0)
RBC: 4.34 Mil/uL (ref 3.87–5.11)
RDW: 14.6 % (ref 11.5–15.5)
WBC: 7.1 K/uL (ref 4.0–10.5)

## 2024-04-16 LAB — COMPREHENSIVE METABOLIC PANEL WITH GFR
ALT: 16 U/L (ref 0–35)
AST: 25 U/L (ref 0–37)
Albumin: 3.5 g/dL (ref 3.5–5.2)
Alkaline Phosphatase: 67 U/L (ref 39–117)
BUN: 5 mg/dL — ABNORMAL LOW (ref 6–23)
CO2: 28 meq/L (ref 19–32)
Calcium: 9.2 mg/dL (ref 8.4–10.5)
Chloride: 101 meq/L (ref 96–112)
Creatinine, Ser: 0.72 mg/dL (ref 0.40–1.20)
GFR: 87.96 mL/min (ref >60.00)
Glucose, Bld: 94 mg/dL (ref 70–99)
Potassium: 3.6 meq/L (ref 3.5–5.1)
Sodium: 137 meq/L (ref 135–145)
Total Bilirubin: 0.4 mg/dL (ref 0.2–1.2)
Total Protein: 7.3 g/dL (ref 6.0–8.3)

## 2024-04-16 MED ORDER — SULFASALAZINE 500 MG PO TABS: 500.0000 mg | ORAL_TABLET | Freq: Four times a day (QID) | ORAL | 5 refills | Status: DC

## 2024-04-16 MED ORDER — NA SULFATE-K SULFATE-MG SULF 17.5-3.13-1.6 GM/177ML PO SOLN: 1.0000 | Freq: Once | ORAL | 0 refills | Status: AC

## 2024-04-16 MED ORDER — FOLIC ACID 1 MG PO TABS: 1.0000 mg | ORAL_TABLET | Freq: Every day | ORAL | 5 refills | Status: AC

## 2024-04-16 NOTE — Patient Instructions (Addendum)
 Your provider has requested that you go to the basement level for lab work before leaving today. Press B on the elevator. The lab is located at the first door on the left as you exit the elevator.  START Sulfasalazine  500 mg four times daily   You have been scheduled for a CT scan of the abdomen and pelvis at [facility], 1st floor Radiology. You are scheduled on 04/29/24 at 6:30 am for a 8:30 am scan. You should arrive 15 minutes prior to your appointment time for registration.    You may take any medications as prescribed with a small amount of water, if necessary. If you take any of the following medications: METFORMIN, GLUCOPHAGE, GLUCOVANCE, AVANDAMET, RIOMET, FORTAMET, ACTOPLUS MET, JANUMET, GLUMETZA or METAGLIP, you MAY be asked to HOLD this medication 48 hours AFTER the exam.   The purpose of you drinking the oral contrast is to aid in the visualization of your intestinal tract. The contrast solution may cause some diarrhea. Depending on your individual set of symptoms, you may also receive an intravenous injection of x-ray contrast/dye. Plan on being at [facility] for 45 minutes or longer, depending on the type of exam you are having performed.   If you have any questions regarding your exam or if you need to reschedule, you may call [facility] at [phone] between the hours of 8:00 am and 5:00 pm, Monday-Friday.   You have been scheduled for a colonoscopy. Please follow written instructions given to you at your visit today.   If you use inhalers (even only as needed), please bring them with you on the day of your procedure.  DO NOT TAKE 7 DAYS PRIOR TO TEST- Trulicity (dulaglutide) Ozempic, Wegovy (semaglutide) Mounjaro, Zepbound (tirzepatide) Bydureon Bcise (exanatide extended release)  DO NOT TAKE 1 DAY PRIOR TO YOUR TEST Rybelsus (semaglutide) Adlyxin (lixisenatide) Victoza (liraglutide) Byetta  (exanatide) ___________________________________________________________________________  Please follow up sooner if symptoms increase or worsen  Due to recent changes in healthcare laws, you may see the results of your imaging and laboratory studies on MyChart before your provider has had a chance to review them.  We understand that in some cases there may be results that are confusing or concerning to you. Not all laboratory results come back in the same time frame and the provider may be waiting for multiple results in order to interpret others.  Please give us  48 hours in order for your provider to thoroughly review all the results before contacting the office for clarification of your results.   Thank you for trusting me with your gastrointestinal care!   [name], PA-C _______________________________________________________  If your blood pressure at your visit was 140/90 or greater, please contact your primary care physician to follow up on this.  _______________________________________________________  If you are age 65 or older, your body mass index should be between 23-30. Your Body mass index is 58.24 kg/m. If this is out of the aforementioned range listed, please consider follow up with your Primary Care Provider.  If you are age 2 or younger, your body mass index should be between 19-25. Your Body mass index is 58.24 kg/m. If this is out of the aformentioned range listed, please consider follow up with your Primary Care Provider.   ________________________________________________________  The [facility] providers would like to encourage you to use MYCHART to communicate with providers for non-urgent requests or questions.  Due to long hold times on the telephone, sending your provider a message by  MYCHART may be a faster and more efficient way to get a response.  Please allow 48 business hours for a response.  Please remember that this is for non-urgent requests.   _______________________________________________________

## 2024-04-16 NOTE — Progress Notes (Signed)
 [name], PA-C [address] [place], [place]  [address] Phone: [phone]   Primary Care Physician: Rollene Almarie LABOR, MD  Primary Gastroenterologist:  [name], PA-C / Dr. Gordy Starch   Chief Complaint: Ulcerative colitis flare       HPI:   Discussed the use of AI scribe software for clinical note transcription with the patient, who gave verbal consent to proceed.  Established patient of Dr. Starch, presents for ulcerative colitis flare since first of November.  Last follow-up office visit with Dr. Starch was 02/2021.  UC originally diagnosed at age 63, 40 years ago (left-sided colitis).  For 5 weeks she has had diarrhea, urgency, malodorous bowel movement, small amount of mucus, consistent blood with every bowel movement with occasional clots.  She has declined Entyvio or other Biologics in the past due to fear of adverse side effects.  She has failed to follow-up in our office since 2022.  She is currently on Eliquis .  Medical history of DVT, PE, PSVT, CAD, morbid obesity.  She recently collected stool test fecal calprotectin, [facility] pathogen with C. difficile.  Results pending.  CBC, CMP, CRP labs were ordered 04/11/24, yet not completed.  01/09/2024 last labs: Normal CBC (WBC 6.6, Hgb 13.1).  Normal CMP, ferritin, and iron panel.  11/2020 last colonoscopy by Dr. Starch: Good prep.  4 polyps removed.  4 mm benign polyp cecum, 6 mm inflammatory polyp transverse colon, 12 mm hyperplastic polyp sigmoid colon, 20 mm tubular adenoma polyp rectosigmoid colon (no dysplasia).  Mild (Mayo score 1) ulcerative colitis from the rectum to the splenic flexure.  Left colon biopsies showed active left-sided ulcerative colitis.  Colon biopsies showed no dysplasia.  Right colon biopsies showed no active inflammation.  1 year repeat colonoscopy was recommended (due 11/2021), however patient failed to follow-up. History of Present Illness Ulcerative colitis flare - Diagnosed at  age 63; long-standing disease. - Recent flare began in early November 2025. - Symptoms include increased urgency to defecate, blood in stool, and occasional small clots. - Bowel movements up to five times daily, with some days less frequent. - Associated soreness at site of previous polyp removal. - No significant cramping or fever. - Has not started sulfasalazine  due to concerns about allergic reactions and being alone at home. - Diet is bland, consisting mainly of lactose-free products, scrambled eggs, and mashed potatoes.  Colonoscopy and polyp surveillance - Colonoscopy performed in July 2022 with removal of four polyps, including a 20mm tubular adenoma from rectum. - Repeat colonoscopy advised in one year, but delayed due to health issues including COVID-19 infection and uterine bleeding.  Abnormal uterine bleeding - Ongoing uterine bleeding since summer 2023. - Required two dilation and curettage (D&C) procedures. - Mirena  IUD placed, and bleeding subsided  Mobility limitations and musculoskeletal symptoms - Difficulty with mobility, especially legs and back. - Pain when rising from the toilet. - Physical limitations complicate ability to manage bowel preparation for procedures. - Difficulty moving quickly. - Morbidly obese; Knee OA; In a wheelchair.  Pulmonary symptoms and anticoagulation - History of shortness of breath. - Recently saw pulmonologist and cardiologist. - Awaiting pulmonary function test scheduled 05/31/23. - On Eliquis  since 2018 for pulmonary embolism.  Refilled by Dr. Timmy.    Current Outpatient Medications  Medication Sig Dispense Refill   acetaminophen  (TYLENOL ) 500 MG tablet Take 1,000 mg by mouth every 6 (six) hours as needed for moderate pain or headache.     ALPRAZolam  (  XANAX ) 1 MG tablet Take 1 tablet (1 mg total) by mouth 2 (two) times daily as needed for anxiety. 60 tablet 5   atenolol  (TENORMIN ) 50 MG tablet Take 0.5 tablets (25 mg total) by  mouth 2 (two) times daily. 90 tablet 3   cholecalciferol (VITAMIN D3) 25 MCG (1000 UNIT) tablet Take 2,000 Units by mouth daily.     cyanocobalamin 1000 MCG tablet Take 1,000 mcg by mouth daily.     ELIQUIS  5 MG TABS tablet TAKE 1 TABLET(5 MG) BY MOUTH TWICE DAILY 60 tablet 6   levonorgestrel  (MIRENA , 52 MG,) 20 MCG/24HR IUD 1 each by Intrauterine route once.     Na Sulfate-K Sulfate-Mg Sulfate concentrate (SUPREP) 17.5-3.13-1.6 GM/177ML SOLN Take 1 kit (354 mLs total) by mouth once for 1 dose. 354 mL 0   sulfaSALAzine  (AZULFIDINE ) 500 MG tablet Take 1 tablet (500 mg total) by mouth 4 (four) times daily. (Patient not taking: Reported on 04/16/2024) 120 tablet 0   No current facility-administered medications for this visit.    Allergies as of 04/16/2024 - Review Complete 04/16/2024  Allergen Reaction Noted   Benadryl [diphenhydramine] Anaphylaxis 04/18/2023   Diphenhydramine hcl Swelling 11/17/2006   Doxycycline  Palpitations 11/12/2013   Hydrocodone  Other (See Comments) 05/06/2016   Sudafed [pseudoephedrine hcl] Shortness Of Breath 02/07/2012   Azithromycin  Palpitations 11/06/2015   Chlorhexidine  gluconate Itching 06/26/2020   Ibuprofen  Itching 05/10/2021   Lactose intolerance (gi) Diarrhea and Other (See Comments) 07/04/2016   Mesalamine  Other (See Comments) 01/17/2007   Caffeine Palpitations 02/07/2012   Codeine  Other (See Comments) 04/07/2020   Other Other (See Comments) 11/10/2020   Penicillins Rash 11/17/2006   Prednisone Palpitations 12/01/2006    Past Medical History:  Diagnosis Date   Adenomatous colon polyp    Adrenal nodule    AIVR (accelerated idioventricular rhythm)    Allergy     Anemia    in her early twenties   Anxiety    Arthritis    knees,thumbs   Cataract    bilateral removed    Clotting disorder 1985?   Heavy bleeding and constant periods. Then in 2020?, endometrial post menopausal bleeding   Complication of anesthesia    BP dropped with [place] colonoscopy in  [place]. no problems since.   Cyst of kidney, acquired    cyst right kidney- benign   Degenerative disc disease, lumbar    Dyspnea    Dysrhythmia    palpitations   Edema    Gallstones    History of anemia    20 yrs. ago not now- pt denies    History of hiatal hernia    Hyperlipidemia ?   Not sure what this is?   IUD (intrauterine device) in place    Mild CAD    Morbid obesity (HCC)    Myocardial bridge    NSVT (nonsustained ventricular tachycardia) (HCC)    Osteoarthritis    Other allergy , other than to medicinal agents    Panic disorder    Premature atrial contractions    PSVT (paroxysmal supraventricular tachycardia)    Pulmonary embolism (HCC)    PVC's (premature ventricular contractions)    Run of atrial premature complexes    Ulcerative colitis, unspecified    Vitamin B12 deficiency    Vitamin D  deficiency    Wheelchair bound     Past Surgical History:  Procedure Laterality Date   BIOPSY  11/17/2020   Procedure: BIOPSY;  Surgeon: Albertus Gordy HERO, MD;  Location: [facility];  Service: Gastroenterology;;  BREAST BIOPSY  11/2011   Left- benign   CATARACT EXTRACTION Right    CATARACT EXTRACTION Left    CHOLECYSTECTOMY N/A 01/17/2014   Procedure: LAPAROSCOPIC CHOLECYSTECTOMY;  Surgeon: Morene Olives, MD;  Location: [facility];  Service: General;  Laterality: N/A;   COLONOSCOPY     COLONOSCOPY WITH PROPOFOL  N/A 11/17/2020   Procedure: COLONOSCOPY WITH PROPOFOL ;  Surgeon: Albertus Gordy HERO, MD;  Location: [facility];  Service: Gastroenterology;  Laterality: N/A;   DILATATION & CURETTAGE/HYSTEROSCOPY WITH MYOSURE N/A 12/23/2019   Procedure: DILATATION & CURETTAGE, HYSTEROSCOPY WITH MYOSURE, RESECTION OF POLYPOID TISSUE;  Surgeon: Cleotilde Ronal RAMAN, MD;  Location: [facility];  Service: Gynecology;  Laterality: N/A;   EYE SURGERY  2016   As noted above,  both eyes and retina tears in left eyes repaired   HYSTEROSCOPY WITH D & C N/A 07/27/2020   Procedure: DILATATION AND CURETTAGE  /HYSTEROSCOPY;  Surgeon: Cathlyn JAYSON Nikki Bobie FORBES, MD;  Location: [facility];  Service: Gynecology;  Laterality: N/A;   INTRAUTERINE DEVICE (IUD) INSERTION N/A 07/27/2020   Procedure: INTRAUTERINE DEVICE (IUD) INSERTION MIRENA ;  Surgeon: Cathlyn JAYSON Nikki Bobie FORBES, MD;  [facility];  Service: Gynecology;  Laterality: N/A;   POLYPECTOMY  11/17/2020   Procedure: POLYPECTOMY;  Surgeon: Albertus Gordy HERO, MD;  Location: [facility];  Service: Gastroenterology;;   RETINAL LASER PROCEDURE Left    SIGMOIDOSCOPY     SUBMUCOSAL TATTOO INJECTION  11/17/2020   Procedure: SUBMUCOSAL TATTOO INJECTION;  Surgeon: Albertus Gordy HERO, MD;  Location: [facility];  Service: Gastroenterology;;   TOOTH EXTRACTION      Review of Systems:    All systems reviewed and negative except where noted in HPI.    Physical Exam:  BP 120/70   Pulse 62   Ht 5' 5 (1.651 m)   Wt (!) 350 lb (158.8 kg) Comment: her patient  LMP 05/09/2010 (Approximate)   BMI 58.24 kg/m  Patient's last menstrual period was 05/09/2010 (approximate).  General: Morbidly obese, deconditioned female sitting in a wheelchair in no acute distress.  She was able to stand and get onto a lowered exam table. Lungs: Clear to auscultation bilaterally. Non-labored. Heart: Regular rate and rhythm, no murmurs rubs or gallops.  Abdomen: Bowel sounds are normal; Abdomen is Soft and very obese; No hepatosplenomegaly or masses; moderate rectus diastases in the midline abdomen.  No Abdominal Tenderness; No guarding or rebound tenderness. Neuro: Alert and oriented x 3.  Grossly intact.  Psych: Alert and cooperative, anxious mood and affect.   Imaging Studies: No results found.  Labs: CBC    Component Value Date/Time   WBC 7.1 04/16/2024 1508   RBC 4.34 04/16/2024 1508   HGB 13.4 04/16/2024 1508   HGB 13.6 04/13/2023 0851   HGB 13.7 03/17/2023 1049   HGB 13.2 01/24/2017 0929   HCT 40.8 04/16/2024 1508   HCT 43.2 03/17/2023 1049   HCT 40.9 01/24/2017 0929    PLT 237.0 04/16/2024 1508   PLT 222 04/13/2023 0851   PLT 246 03/17/2023 1049   MCV 94.0 04/16/2024 1508   MCV 95 03/17/2023 1049   MCV 94 01/24/2017 0929   MCH 30.7 01/09/2024 1405   MCHC 32.8 04/16/2024 1508   RDW 14.6 04/16/2024 1508   RDW 12.5 03/17/2023 1049   RDW 14.6 01/24/2017 0929   LYMPHSABS 1.2 04/16/2024 1508   LYMPHSABS 1.8 03/06/2020 0847   LYMPHSABS 1.8 01/24/2017 0929   MONOABS 0.4 04/16/2024 1508   EOSABS 0.4 04/16/2024 1508  EOSABS 0.2 03/06/2020 0847   EOSABS 0.2 01/24/2017 0929   BASOSABS 0.0 04/16/2024 1508   BASOSABS 0.0 03/06/2020 0847   BASOSABS 0.0 01/24/2017 0929    CMP     Component Value Date/Time   NA 137 04/16/2024 1508   NA 142 03/17/2023 1049   NA 139 01/24/2017 0929   K 3.6 04/16/2024 1508   K 4.0 01/24/2017 0929   CL 101 04/16/2024 1508   CL 103 10/10/2016 1403   CL 105 09/09/2016 1332   CO2 28 04/16/2024 1508   CO2 24 01/24/2017 0929   GLUCOSE 94 04/16/2024 1508   GLUCOSE 100 01/24/2017 0929   GLUCOSE 92 09/09/2016 1332   BUN 5 (L) 04/16/2024 1508   BUN 8 03/17/2023 1049   BUN 9.5 01/24/2017 0929   CREATININE 0.72 04/16/2024 1508   CREATININE 0.75 01/09/2024 1405   CREATININE 0.86 01/31/2020 0835   CREATININE 0.8 01/24/2017 0929   CALCIUM 9.2 04/16/2024 1508   CALCIUM 9.7 01/24/2017 0929   PROT 7.3 04/16/2024 1508   PROT 7.6 01/24/2017 0929   ALBUMIN 3.5 04/16/2024 1508   ALBUMIN 3.2 (L) 01/24/2017 0929   AST 25 04/16/2024 1508   AST 20 01/09/2024 1405   AST 18 01/24/2017 0929   ALT 16 04/16/2024 1508   ALT 10 01/09/2024 1405   ALT 16 01/24/2017 0929   ALKPHOS 67 04/16/2024 1508   ALKPHOS 107 01/24/2017 0929   BILITOT 0.4 04/16/2024 1508   BILITOT 0.5 01/09/2024 1405   BILITOT 0.31 01/24/2017 0929   GFRNONAA >60 01/09/2024 1405   GFRAA >60 01/07/2019 0749       Assessment and Plan:   Cassie [name] is a 63 y.o. y/o female presents for flareup of longstanding left-sided ulcerative colitis for 40 years  (diagnosed age 81).  Has had flareup for 5 weeks.  Recent fecal calprotectin and C. difficile stool test were ordered, results pending.  Last colonoscopy 11/2020 showed a large 20 mm tubular adenoma polyp removed from rectum.  No dysplasia in the polyp or on colon biopsies.  She was scheduled for 1 year repeat colonoscopy, however she canceled procedure and has failed to follow-up in our office since 2022.  She is currently taking sulfasalazine  500 mg 4 times daily.  This is not controlling her UC.  She has adamantly declined Entyvio or biologic treatment in the past due to concerns about adverse side effects of medication.  1.  Chronic left-sided ulcerative colitis for 40 years (diagnosed age 46), with current recent flare. - Follow-up with recent stool test fecal calprotectin and [facility] C. difficile. - Labs drawn today: CBC, CMP, CRP - Restart sulfasalazine  500 mg 4 times daily - Start folic acid  1 mg daily - Patient adamantly declines treatment with Entyvio or other Biologics.  2.  History of large tubular adenoma polyp removed from rectum 11/2020.  Overdue for surveillance colonoscopy. - Schedule repeat surveillance colonoscopy in hospital with Dr. Albertus. - I discussed risks of colonoscopy with patient to include risk of bleeding, colon perforation, and risk of sedation.  Patient expressed understanding and agrees to proceed with colonoscopy.   3.  Medical noncompliance  4.  Comorbidities: Morbid obesity (BMI 58), Hx DVT, PE, currently on Eliquis , mild CAD, PSVT. - Request permission to hold Eliquis  2 days prior to colonoscopy. - Schedule colonoscopy in hospital.  5.  Ventral hernia / Rectus diastases Ventral hernia due to muscle separation. No surgical intervention due to size and potential complications. - Recommended weight  loss as a management strategy.    [name], PA-C  Follow up with Dr. Albertus in 3 months.

## 2024-04-16 NOTE — Telephone Encounter (Signed)
  Cassie [name] 1960-11-19 [id number]  04/16/24   Dear Dr. Timmy:  We have scheduled the above named patient for a(n) Colonoscopy procedure. Our records show that (s)he is on anticoagulation therapy.  Please advise as to whether the patient may come off their therapy of Eliquis  2 days prior to their procedure which is scheduled for 06/11/24.  Please route your response to Alethea Blocker, CMA or fax response to [phone].  Sincerely,    [facility]

## 2024-04-19 LAB — CALPROTECTIN: Calprotectin: 2130 ug/g — ABNORMAL HIGH

## 2024-04-29 ENCOUNTER — Ambulatory Visit (HOSPITAL_COMMUNITY)

## 2024-04-29 DIAGNOSIS — K51019 Ulcerative (chronic) pancolitis with unspecified complications: Secondary | ICD-10-CM

## 2024-04-29 DIAGNOSIS — R197 Diarrhea, unspecified: Secondary | ICD-10-CM

## 2024-05-01 NOTE — Telephone Encounter (Signed)
 Dr Pyrtle/Tina-  This patient is under the impression that she was having an H Pylori test as well as C Diff test completed through [facility]. Patient did return a specimen on 12/5 but per [facility], test was not completed due to missing paperwork.  I am only able to locate a previous order for C Diff testing. Was she supposed to be getting H Pylori testing as well?

## 2024-05-04 ENCOUNTER — Encounter: Payer: Self-pay | Admitting: Internal Medicine

## 2024-05-07 NOTE — Telephone Encounter (Signed)
 Can repeat fecal calpro at 4 weeks after starting sulfasalazine  JMP

## 2024-05-08 ENCOUNTER — Other Ambulatory Visit: Payer: Self-pay

## 2024-05-08 DIAGNOSIS — K51019 Ulcerative (chronic) pancolitis with unspecified complications: Secondary | ICD-10-CM

## 2024-05-13 ENCOUNTER — Other Ambulatory Visit: Payer: Self-pay

## 2024-05-13 ENCOUNTER — Ambulatory Visit (HOSPITAL_BASED_OUTPATIENT_CLINIC_OR_DEPARTMENT_OTHER): Admitting: Pulmonary Disease

## 2024-05-13 ENCOUNTER — Encounter (HOSPITAL_BASED_OUTPATIENT_CLINIC_OR_DEPARTMENT_OTHER)

## 2024-05-13 DIAGNOSIS — R1084 Generalized abdominal pain: Secondary | ICD-10-CM

## 2024-05-13 NOTE — Telephone Encounter (Signed)
 Without diarrhea we should not test for C diff Okay to repeat fecal calprotectin and H. pylori stool antigen now Bj's sulfasalazine  as an intolerance for her [employer]

## 2024-05-21 ENCOUNTER — Encounter: Payer: Self-pay | Admitting: Obstetrics and Gynecology

## 2024-05-21 NOTE — Telephone Encounter (Signed)
 Dr. Nikki -please review patient request and advise.

## 2024-05-21 NOTE — Telephone Encounter (Signed)
 EPIC secure chat to [facility] with request per Dr. Nikki, awaiting response.

## 2024-05-21 NOTE — Telephone Encounter (Signed)
 Confirmed with Rock at [facility], added to imaging comments.   Routing FYI.   Encounter closed.

## 2024-05-22 ENCOUNTER — Ambulatory Visit (HOSPITAL_COMMUNITY)
Admission: RE | Admit: 2024-05-22 | Discharge: 2024-05-22 | Disposition: A | Discharge status: Home / Self Care | Source: Ambulatory Visit | Attending: Physician Assistant | Admitting: Physician Assistant

## 2024-05-22 ENCOUNTER — Encounter (HOSPITAL_BASED_OUTPATIENT_CLINIC_OR_DEPARTMENT_OTHER): Payer: Self-pay

## 2024-05-22 DIAGNOSIS — R1084 Generalized abdominal pain: Secondary | ICD-10-CM | POA: Diagnosis present

## 2024-05-22 DIAGNOSIS — K51019 Ulcerative (chronic) pancolitis with unspecified complications: Secondary | ICD-10-CM | POA: Diagnosis present

## 2024-05-22 MED ORDER — IOHEXOL 300 MG/ML  SOLN
100.0000 mL | Freq: Once | INTRAMUSCULAR | Status: AC | PRN
  Administered 2024-05-22: 125 mL via INTRAVENOUS

## 2024-05-23 ENCOUNTER — Ambulatory Visit: Payer: Self-pay | Admitting: Physician Assistant

## 2024-05-23 ENCOUNTER — Other Ambulatory Visit

## 2024-05-23 DIAGNOSIS — R1084 Generalized abdominal pain: Secondary | ICD-10-CM

## 2024-05-23 DIAGNOSIS — K51019 Ulcerative (chronic) pancolitis with unspecified complications: Secondary | ICD-10-CM

## 2024-05-25 ENCOUNTER — Ambulatory Visit: Payer: Self-pay | Admitting: Internal Medicine

## 2024-05-25 LAB — H. PYLORI ANTIGEN, STOOL: H pylori Ag, Stl: NEGATIVE

## 2024-05-26 LAB — CALPROTECTIN, FECAL: Calprotectin, Fecal: 3820 ug/g — ABNORMAL HIGH (ref 0–120)

## 2024-05-28 NOTE — Telephone Encounter (Signed)
Routing FYI.   Encounter closed.

## 2024-05-29 ENCOUNTER — Encounter (HOSPITAL_COMMUNITY): Payer: Self-pay | Admitting: Internal Medicine

## 2024-05-29 NOTE — Progress Notes (Signed)
 Cassie [name]    PCP-Crawford MD Cardiologist-Christopher MD Pulmonologist-Ellison MD  EKG-03/17/23 Echo-04/05/23 Cath- debara  Stress-2005 ICD/PM-n/a GLP1-n/a Blood Thinner-Eliquis  2 day hold prior  History:CAD, Palpitations, Kidney Cyst, Accelerated IVR/NSVT, PE. Patient saw cardiologist 08/14/23 and recommended f/u 6 months having appt 06/24/24. Sees pulm. SOB last saw 02/27/24 f/u 06/06/24. Per patient not having any heart issues, still has some SOB but they've done testing and found it was more deconditiong than anything else, biggest issues are her legs not being very mobile right now primarily w/c. She said her pulm did OSA test and found possible mild OSA not requiring CPAP.    Anesthesia Review- Yes- okay to proceed

## 2024-05-30 ENCOUNTER — Ambulatory Visit (HOSPITAL_BASED_OUTPATIENT_CLINIC_OR_DEPARTMENT_OTHER): Admitting: Cardiology

## 2024-06-04 ENCOUNTER — Telehealth: Payer: Self-pay | Admitting: Internal Medicine

## 2024-06-04 NOTE — Telephone Encounter (Signed)
 Inbound call from patient requesting a call to discuss prep instructions for 2/3 hospital colonoscopy. Please advise, thank you

## 2024-06-04 NOTE — Telephone Encounter (Signed)
 Spoke to patient. Gave her the procedure date & time, to arrive at 6:45 AM (1.5 hrs prior to appointment time 8:15 AM). Patient had multiple questions about her hospital procedure. All questions were answered to the patient's satisfaction.

## 2024-06-06 ENCOUNTER — Ambulatory Visit (HOSPITAL_BASED_OUTPATIENT_CLINIC_OR_DEPARTMENT_OTHER): Admitting: Pulmonary Disease

## 2024-06-06 ENCOUNTER — Encounter (HOSPITAL_BASED_OUTPATIENT_CLINIC_OR_DEPARTMENT_OTHER)

## 2024-06-07 ENCOUNTER — Telehealth: Payer: Self-pay

## 2024-06-07 NOTE — Telephone Encounter (Signed)
 Procedure:COLON Procedure date: 06/11/24 Procedure location: [facility] Arrival Time: 6:48 Spoke with the patient Y/N: Y Any prep concerns? N Has the patient obtained the prep from the pharmacy ? Y Do you have a care partner and transportation: Y Any additional concerns? Y   I called the patient and the patient was concerned about getting snowed in and what she should do in case that happens.

## 2024-06-07 NOTE — Telephone Encounter (Signed)
 I have spoken to patient to advise that Dr Albertus will be at the hospital for procedures on 2/3. If she feels she cannot safely get to the procedure, she may call our on call physician to make them aware so they can relay to Dr Albertus. She verbalizes understanding.

## 2024-06-11 ENCOUNTER — Ambulatory Visit (HOSPITAL_COMMUNITY): Admission: RE | Admit: 2024-06-11 | Source: Home / Self Care | Admitting: Internal Medicine

## 2024-06-11 ENCOUNTER — Encounter (HOSPITAL_COMMUNITY): Admission: RE | Payer: Self-pay | Source: Home / Self Care

## 2024-06-24 ENCOUNTER — Ambulatory Visit (HOSPITAL_BASED_OUTPATIENT_CLINIC_OR_DEPARTMENT_OTHER): Admitting: Nurse Practitioner

## 2024-07-04 ENCOUNTER — Encounter (HOSPITAL_BASED_OUTPATIENT_CLINIC_OR_DEPARTMENT_OTHER)

## 2024-07-04 ENCOUNTER — Ambulatory Visit (HOSPITAL_BASED_OUTPATIENT_CLINIC_OR_DEPARTMENT_OTHER): Admitting: Pulmonary Disease
